# Patient Record
Sex: Female | Born: 1975
Health system: Southern US, Community
[De-identification: ages and names within clinical notes are randomized; demographics above are authoritative.]

## PROBLEM LIST (undated history)

## (undated) DIAGNOSIS — E119 Type 2 diabetes mellitus without complications: Secondary | ICD-10-CM

## (undated) DIAGNOSIS — E538 Deficiency of other specified B group vitamins: Secondary | ICD-10-CM

## (undated) DIAGNOSIS — I10 Essential (primary) hypertension: Secondary | ICD-10-CM

## (undated) DIAGNOSIS — K219 Gastro-esophageal reflux disease without esophagitis: Secondary | ICD-10-CM

## (undated) DIAGNOSIS — F32A Depression, unspecified: Secondary | ICD-10-CM

## (undated) DIAGNOSIS — M51369 Other intervertebral disc degeneration, lumbar region without mention of lumbar back pain or lower extremity pain: Secondary | ICD-10-CM

## (undated) DIAGNOSIS — F419 Anxiety disorder, unspecified: Secondary | ICD-10-CM

## (undated) DIAGNOSIS — K8020 Calculus of gallbladder without cholecystitis without obstruction: Secondary | ICD-10-CM

## (undated) DIAGNOSIS — E669 Obesity, unspecified: Secondary | ICD-10-CM

## (undated) DIAGNOSIS — G4730 Sleep apnea, unspecified: Secondary | ICD-10-CM

## (undated) DIAGNOSIS — K259 Gastric ulcer, unspecified as acute or chronic, without hemorrhage or perforation: Secondary | ICD-10-CM

## (undated) DIAGNOSIS — F329 Major depressive disorder, single episode, unspecified: Secondary | ICD-10-CM

## (undated) DIAGNOSIS — M1990 Unspecified osteoarthritis, unspecified site: Secondary | ICD-10-CM

## (undated) DIAGNOSIS — Z9884 Bariatric surgery status: Secondary | ICD-10-CM

## (undated) DIAGNOSIS — E039 Hypothyroidism, unspecified: Secondary | ICD-10-CM

## (undated) DIAGNOSIS — E079 Disorder of thyroid, unspecified: Secondary | ICD-10-CM

## (undated) HISTORY — DX: Gastric ulcer, unspecified as acute or chronic, without hemorrhage or perforation: K25.9

## (undated) HISTORY — DX: Unspecified osteoarthritis, unspecified site: M19.90

## (undated) HISTORY — DX: Disorder of thyroid, unspecified: E07.9

## (undated) HISTORY — DX: Gastro-esophageal reflux disease without esophagitis: K21.9

## (undated) HISTORY — PX: BARIATRIC SURGERY: SHX1103

---

## 1993-09-03 HISTORY — PX: BREAST BIOPSY: SHX20

## 1994-09-03 HISTORY — PX: TUBAL LIGATION: SHX77

## 2004-07-15 ENCOUNTER — Emergency Department: Payer: Self-pay | Admitting: Emergency Medicine

## 2004-08-07 ENCOUNTER — Emergency Department: Payer: Self-pay | Admitting: Emergency Medicine

## 2005-11-20 ENCOUNTER — Emergency Department: Payer: Self-pay | Admitting: Emergency Medicine

## 2006-01-01 ENCOUNTER — Emergency Department: Payer: Self-pay | Admitting: Emergency Medicine

## 2006-01-01 IMAGING — CR DG HAND COMPLETE 3+V*L*
1 series · 3 of 3 positions shown · non-contrast
Comparison: none

REASON FOR EXAM: INJURY
COMMENTS:

PROCEDURE:     DXR - DXR HAND LT COMPLETE  W/OBLIQUES  - [DATE]  [DATE]
RESULT:     Three views of the LEFT hand show no fracture, dislocation or
other acute bony abnormality.

[Series 1: view not recorded · 0.17mm/px · 3 of 3 slices shown]
[im 1/3]
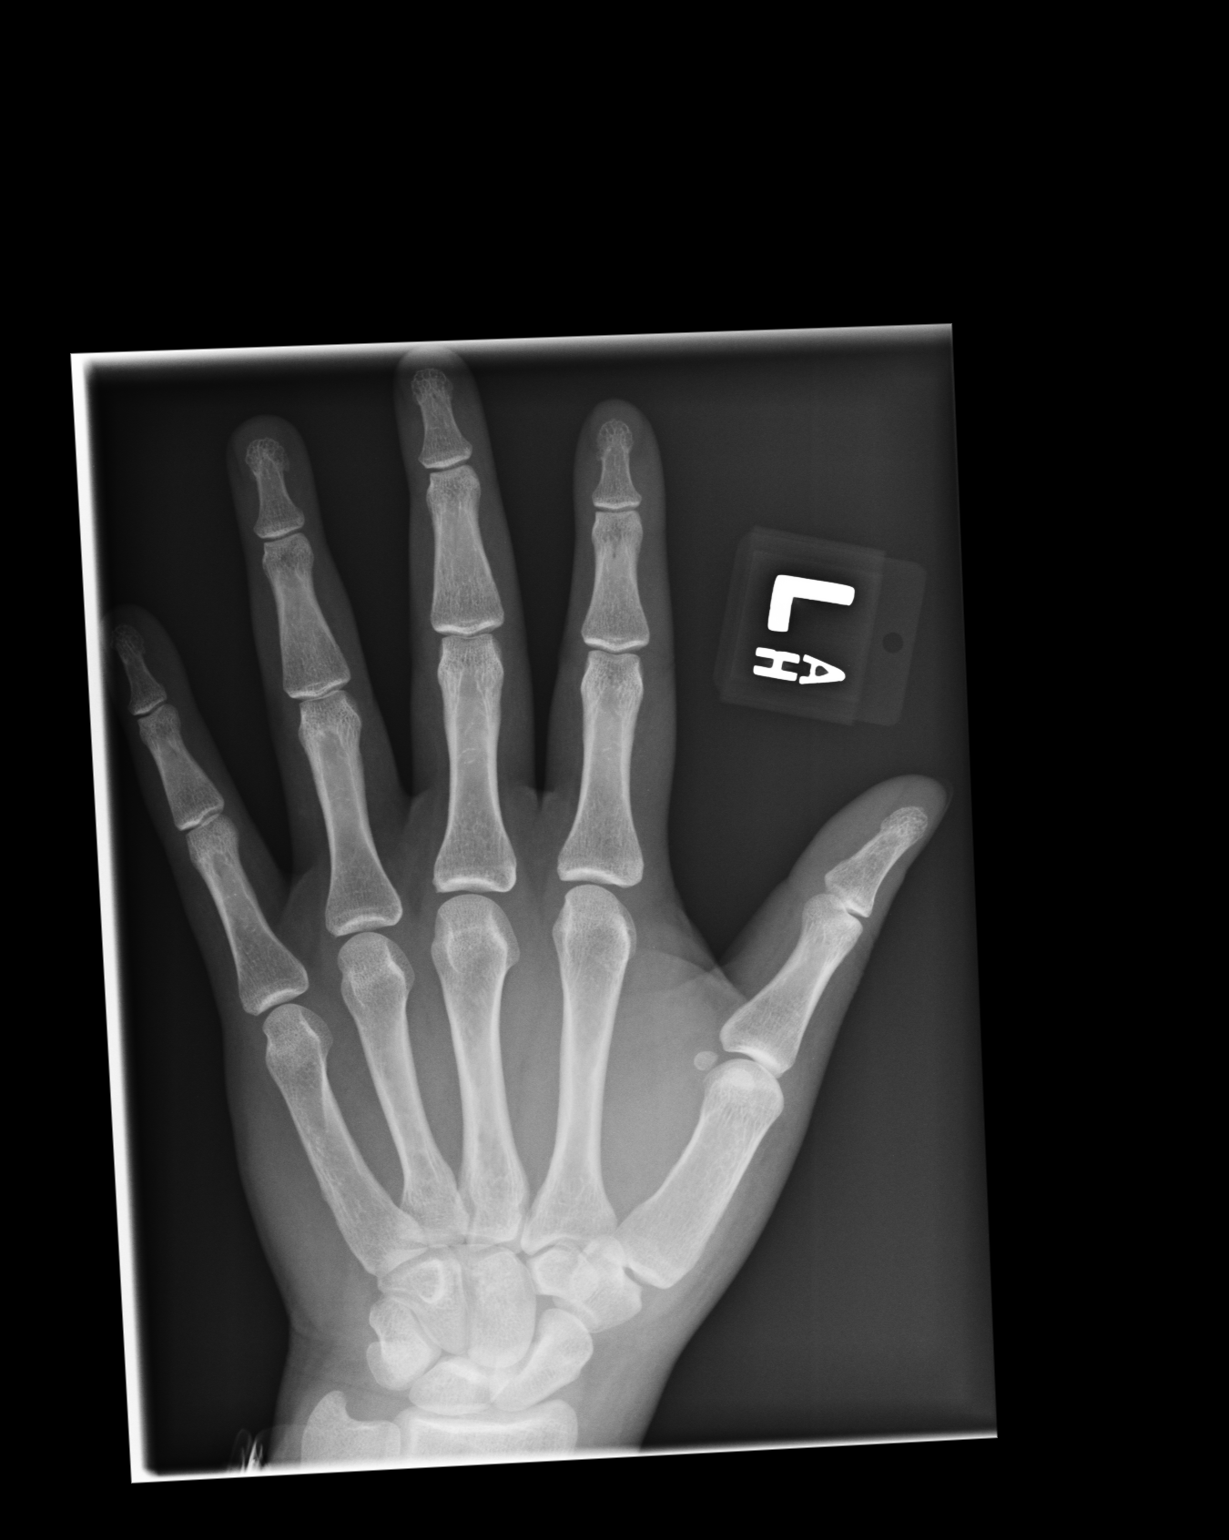
[im 2/3]
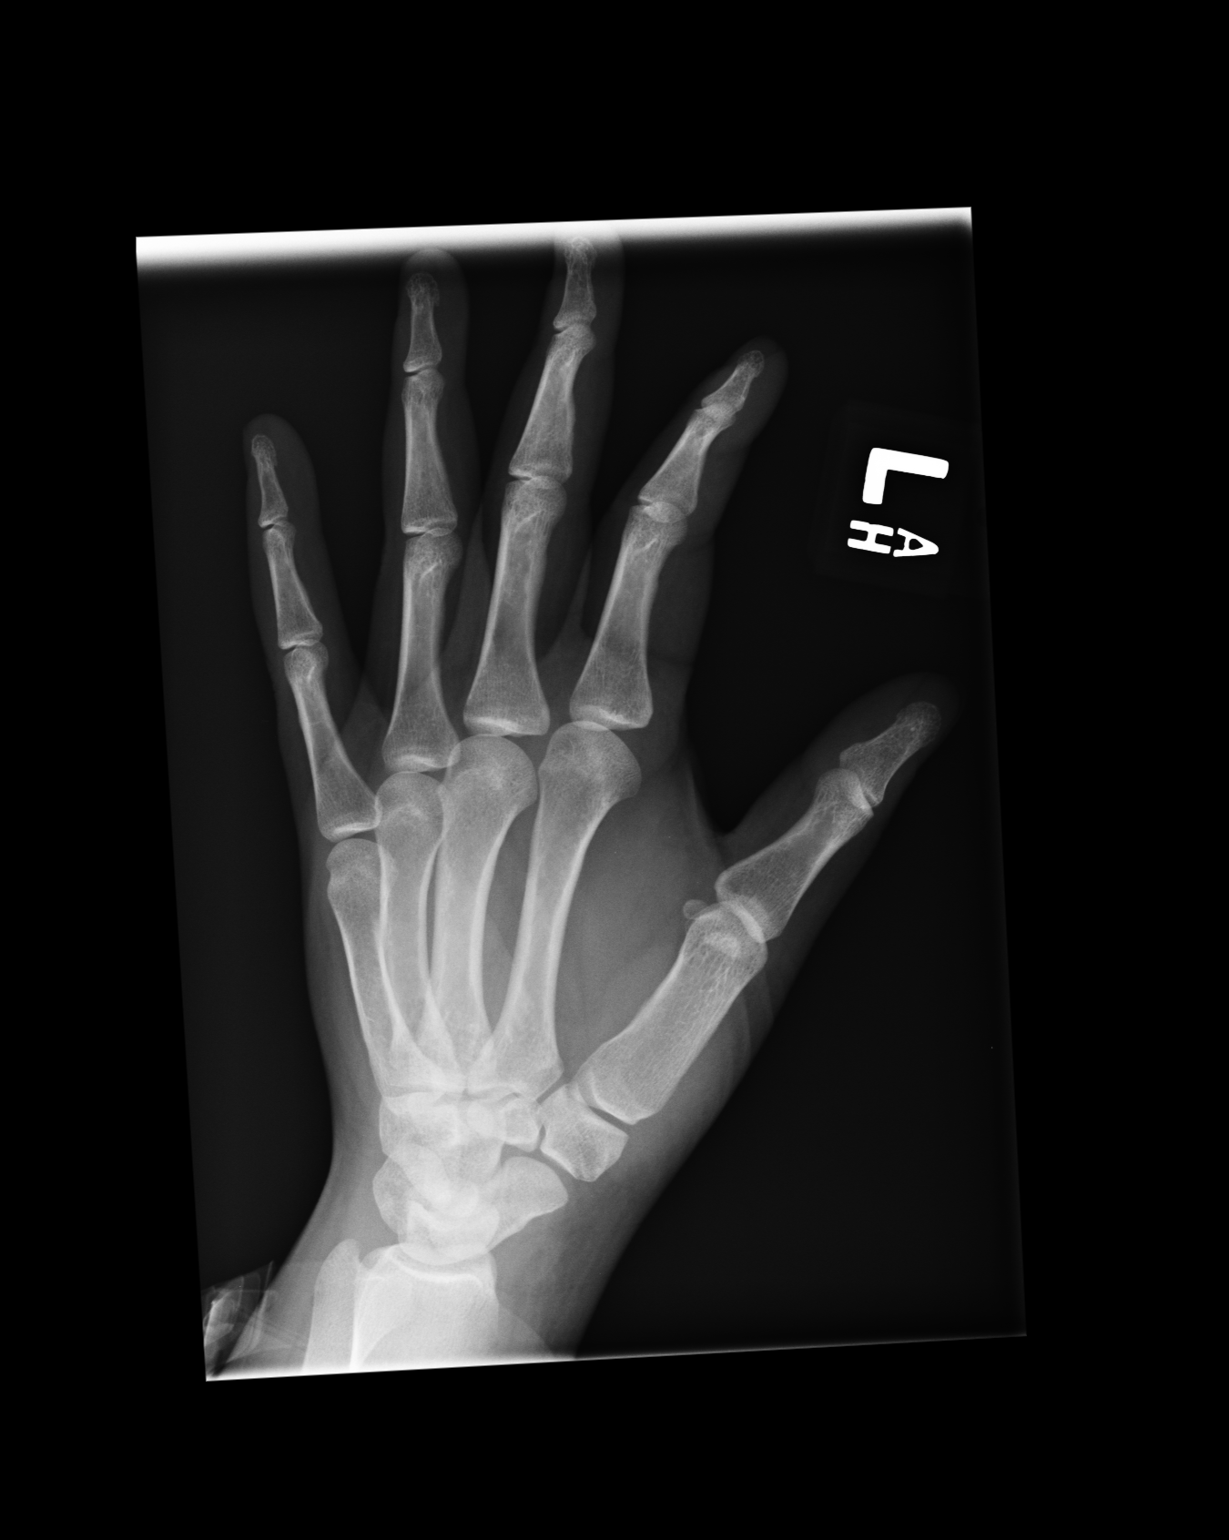
[im 3/3]
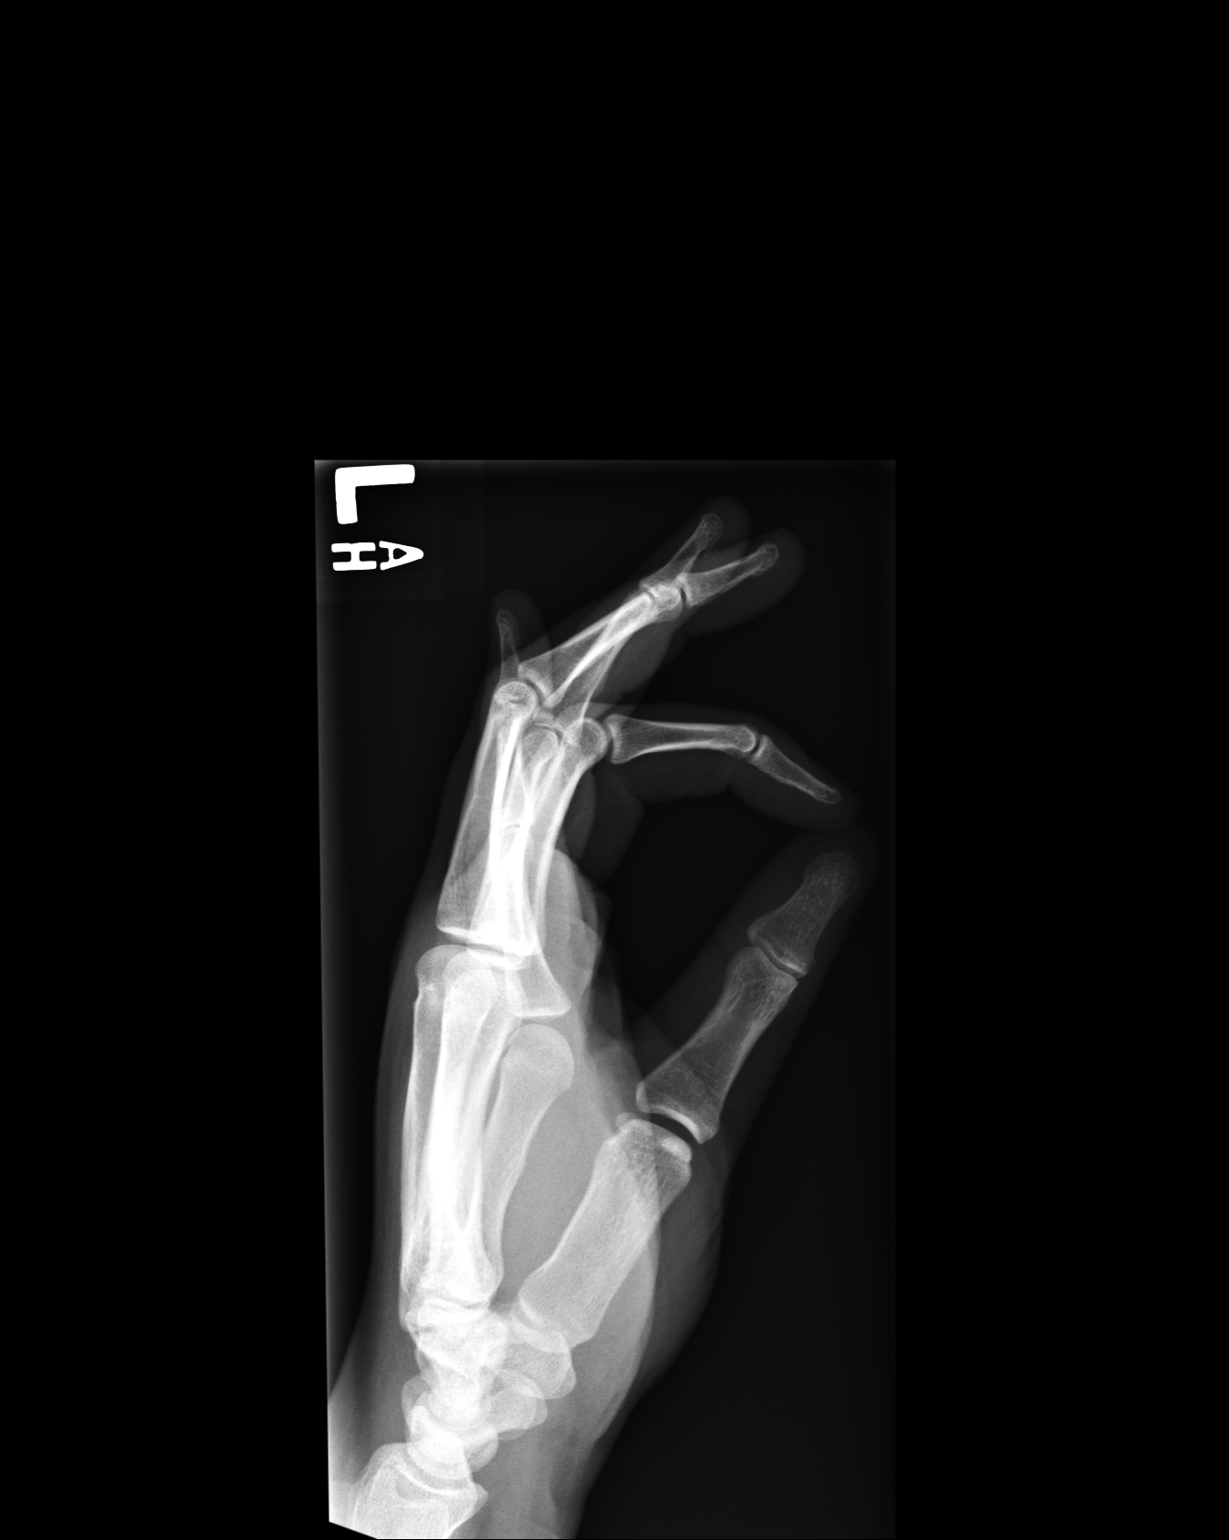

[3 of 3 positions shown; findings below may reference images not displayed]

IMPRESSION: No significant abnormalities are noted.

## 2006-01-27 ENCOUNTER — Emergency Department: Payer: Self-pay | Admitting: Emergency Medicine

## 2006-04-22 ENCOUNTER — Emergency Department: Payer: Self-pay | Admitting: Emergency Medicine

## 2006-09-25 ENCOUNTER — Emergency Department: Payer: Self-pay

## 2007-10-27 ENCOUNTER — Emergency Department: Payer: Self-pay | Admitting: Emergency Medicine

## 2007-10-27 ENCOUNTER — Other Ambulatory Visit: Payer: Self-pay

## 2008-11-24 ENCOUNTER — Ambulatory Visit: Payer: Self-pay | Admitting: Internal Medicine

## 2009-04-12 ENCOUNTER — Emergency Department: Payer: Self-pay | Admitting: Emergency Medicine

## 2009-09-04 ENCOUNTER — Ambulatory Visit: Payer: Self-pay | Admitting: Family Medicine

## 2009-10-24 ENCOUNTER — Ambulatory Visit: Payer: Self-pay | Admitting: Family Medicine

## 2009-10-27 ENCOUNTER — Ambulatory Visit: Payer: Self-pay | Admitting: Family Medicine

## 2010-08-20 ENCOUNTER — Ambulatory Visit: Payer: Self-pay | Admitting: Internal Medicine

## 2010-09-03 HISTORY — PX: GASTRIC BYPASS: SHX52

## 2010-11-17 ENCOUNTER — Ambulatory Visit: Payer: Self-pay | Admitting: Internal Medicine

## 2011-05-18 ENCOUNTER — Ambulatory Visit: Payer: Self-pay | Admitting: Bariatrics

## 2011-05-18 IMAGING — RF DG UGI W/O KUB
1 series · 15 of 24 positions shown · non-contrast
Comparison: None

REASON FOR EXAM: morbid obesity hypertension
COMMENTS:

PROCEDURE:     FL  - FL UPPER GI  - [DATE]  [DATE]
RESULT:     Indication: Obesity

[Series 1: run · 25 acquisitions, 15 frames shown]
[im 1/25]
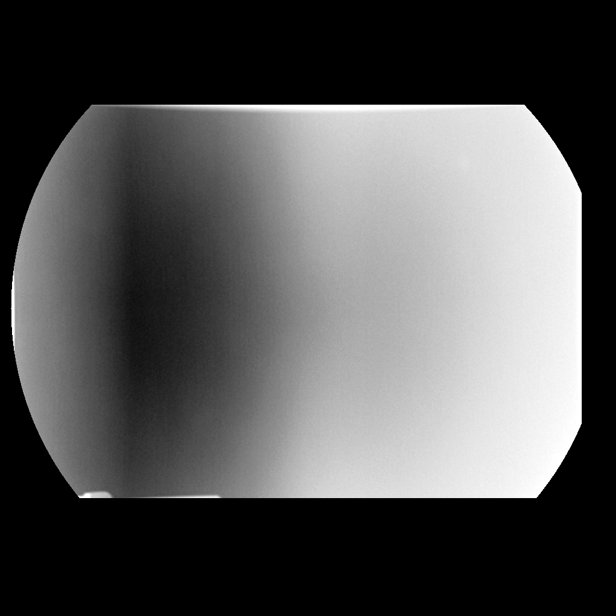
[im 2/25]
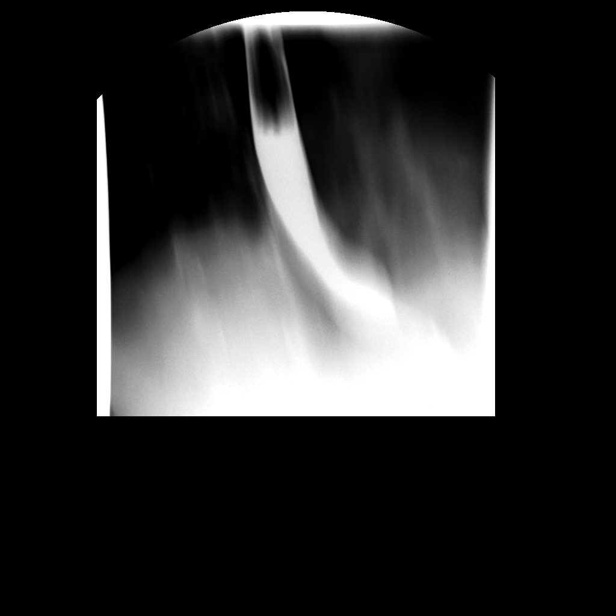
[im 3/25]
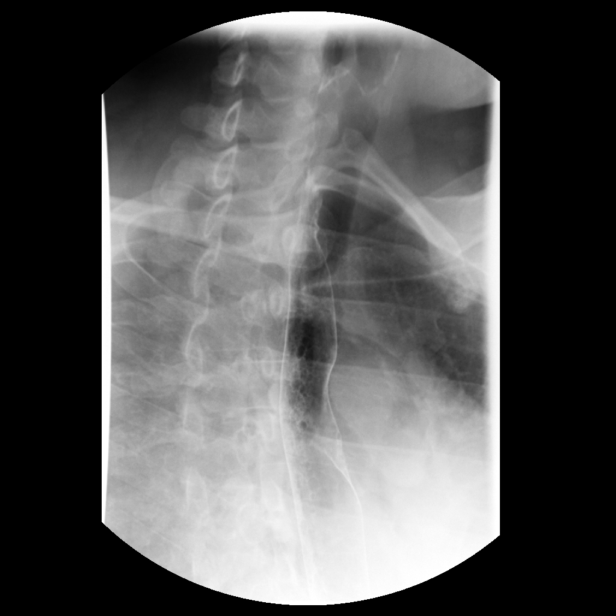
[im 5/25]
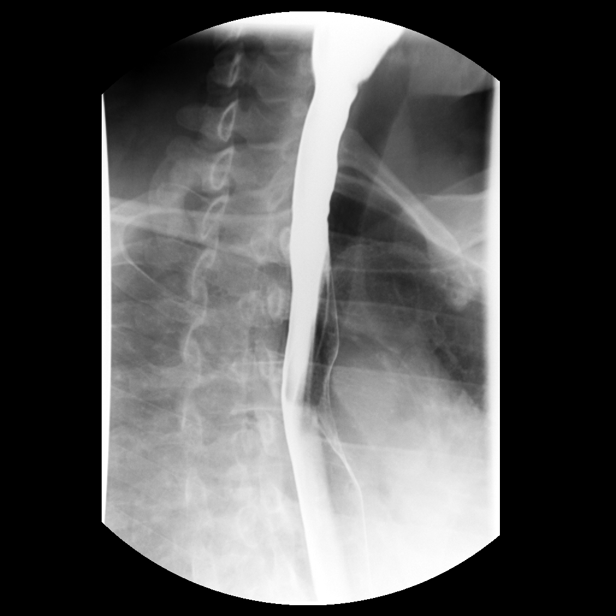
[im 7/25]
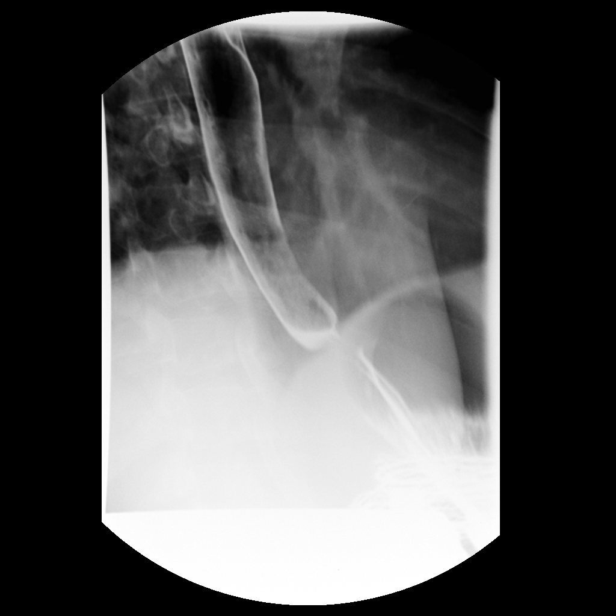
[im 8/25]
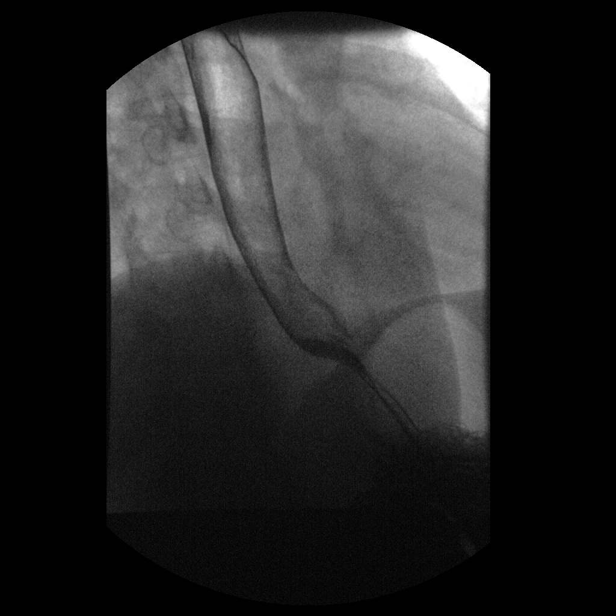
[im 11/25]
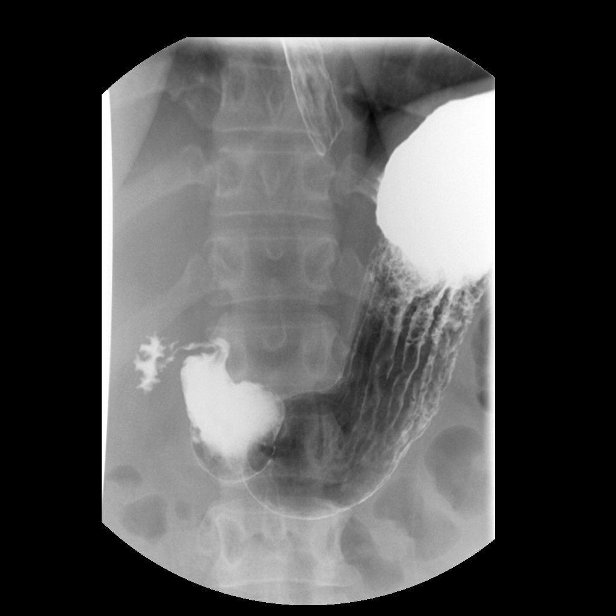
[im 14/25]
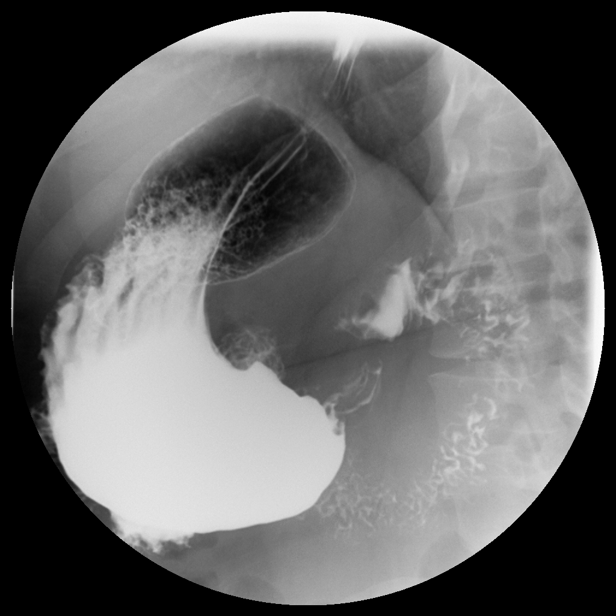
[im 14/25]
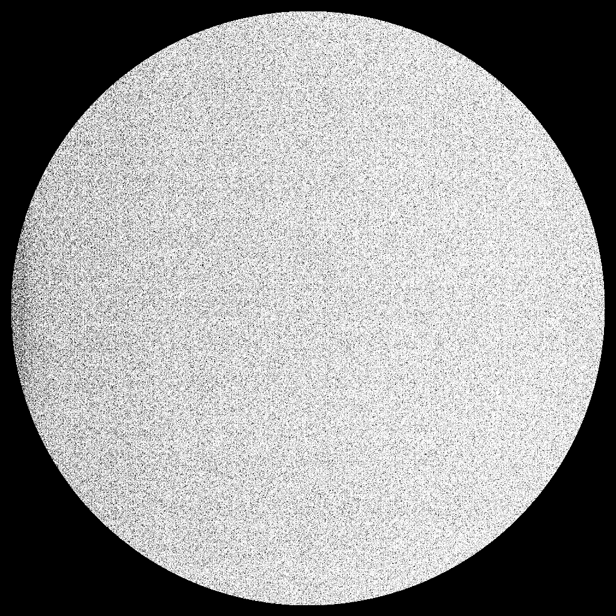
[im 17/25]
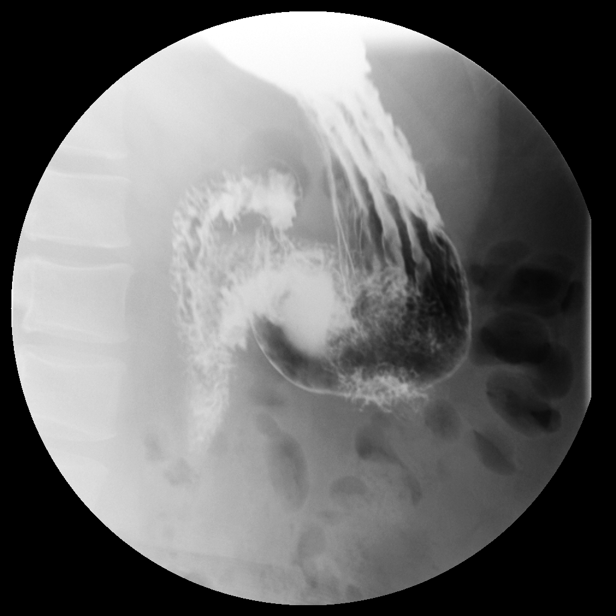
[im 18/25]
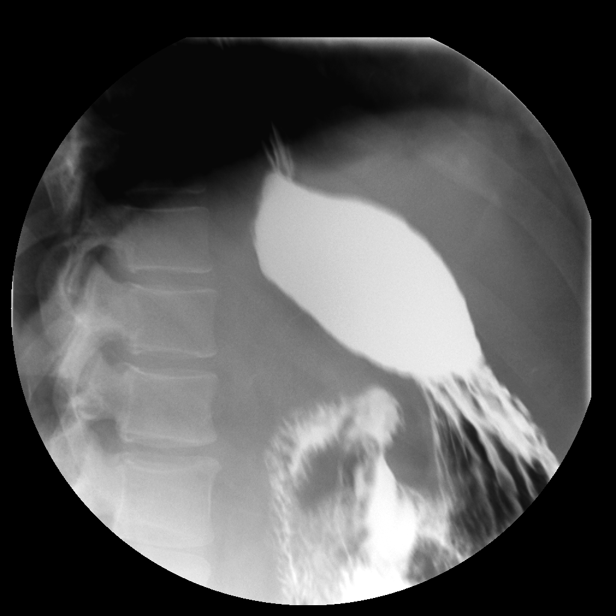
[im 20/25]
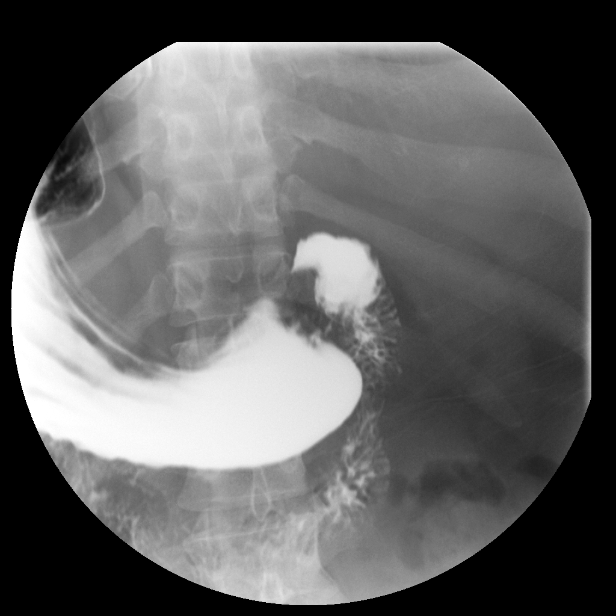
[im 22/25]
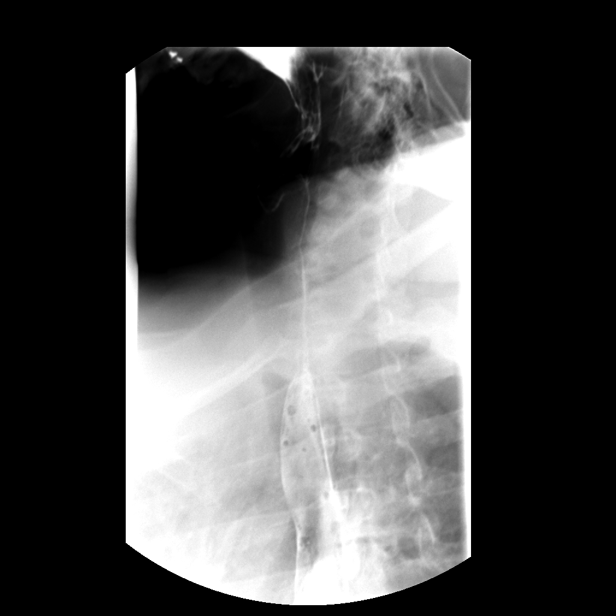
[im 22/25]
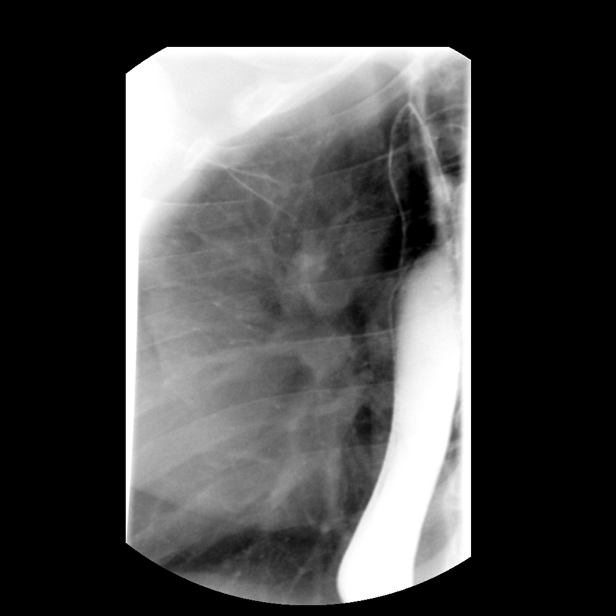
[im 25/25]
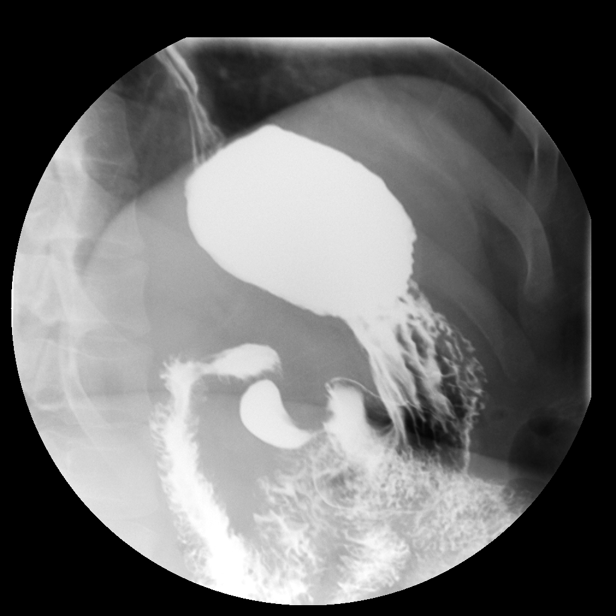

[15 of 24 positions shown; findings below may reference images not displayed]

FINDINGS: Biphasic examination of the esophagus to the distal duodenum was performed
without complication. Total fluoroscopy time was 1.5 minutes.

Examination of the esophagus demonstrated normal esophageal motility. Normal
esophageal morphology without evidence of esophagitis or ulceration. No
esophageal stricture, diverticula, or mass lesion. No evidence of hiatal
hernia. There is mild gastroesophageal reflux.

Examination of the stomach demonstrated normal rugal folds and areae
gastricae. The gastric mucosa appeared unremarkable without evidence of
ulceration, scarring, or mass lesion. Gastric motility and emptying was
normal. Fluoroscopic examination of the duodenum demonstrates normal
motility and morphology without evidence of ulceration or mass lesion.
IMPRESSION: Mild gastroesophageal reflux. Otherwise normal Upper GI.

## 2011-05-18 IMAGING — CR DG CHEST 2V
1 series · 2 of 2 positions shown · non-contrast
Comparison: none

REASON FOR EXAM: morbjd obesity, hypertension
COMMENTS:

PROCEDURE:     DXR - DXR CHEST PA (OR AP) AND LATERAL  - [DATE]  [DATE]
RESULT:     The lung fields are clear. No pneumonia, pneumothorax or pleural
effusion is seen. Heart size is normal. The mediastinal and osseous
structures show no significant abnormalities.

[Series 1: view not recorded · 0.17mm/px · 2 of 2 slices shown]
[im 1/2]
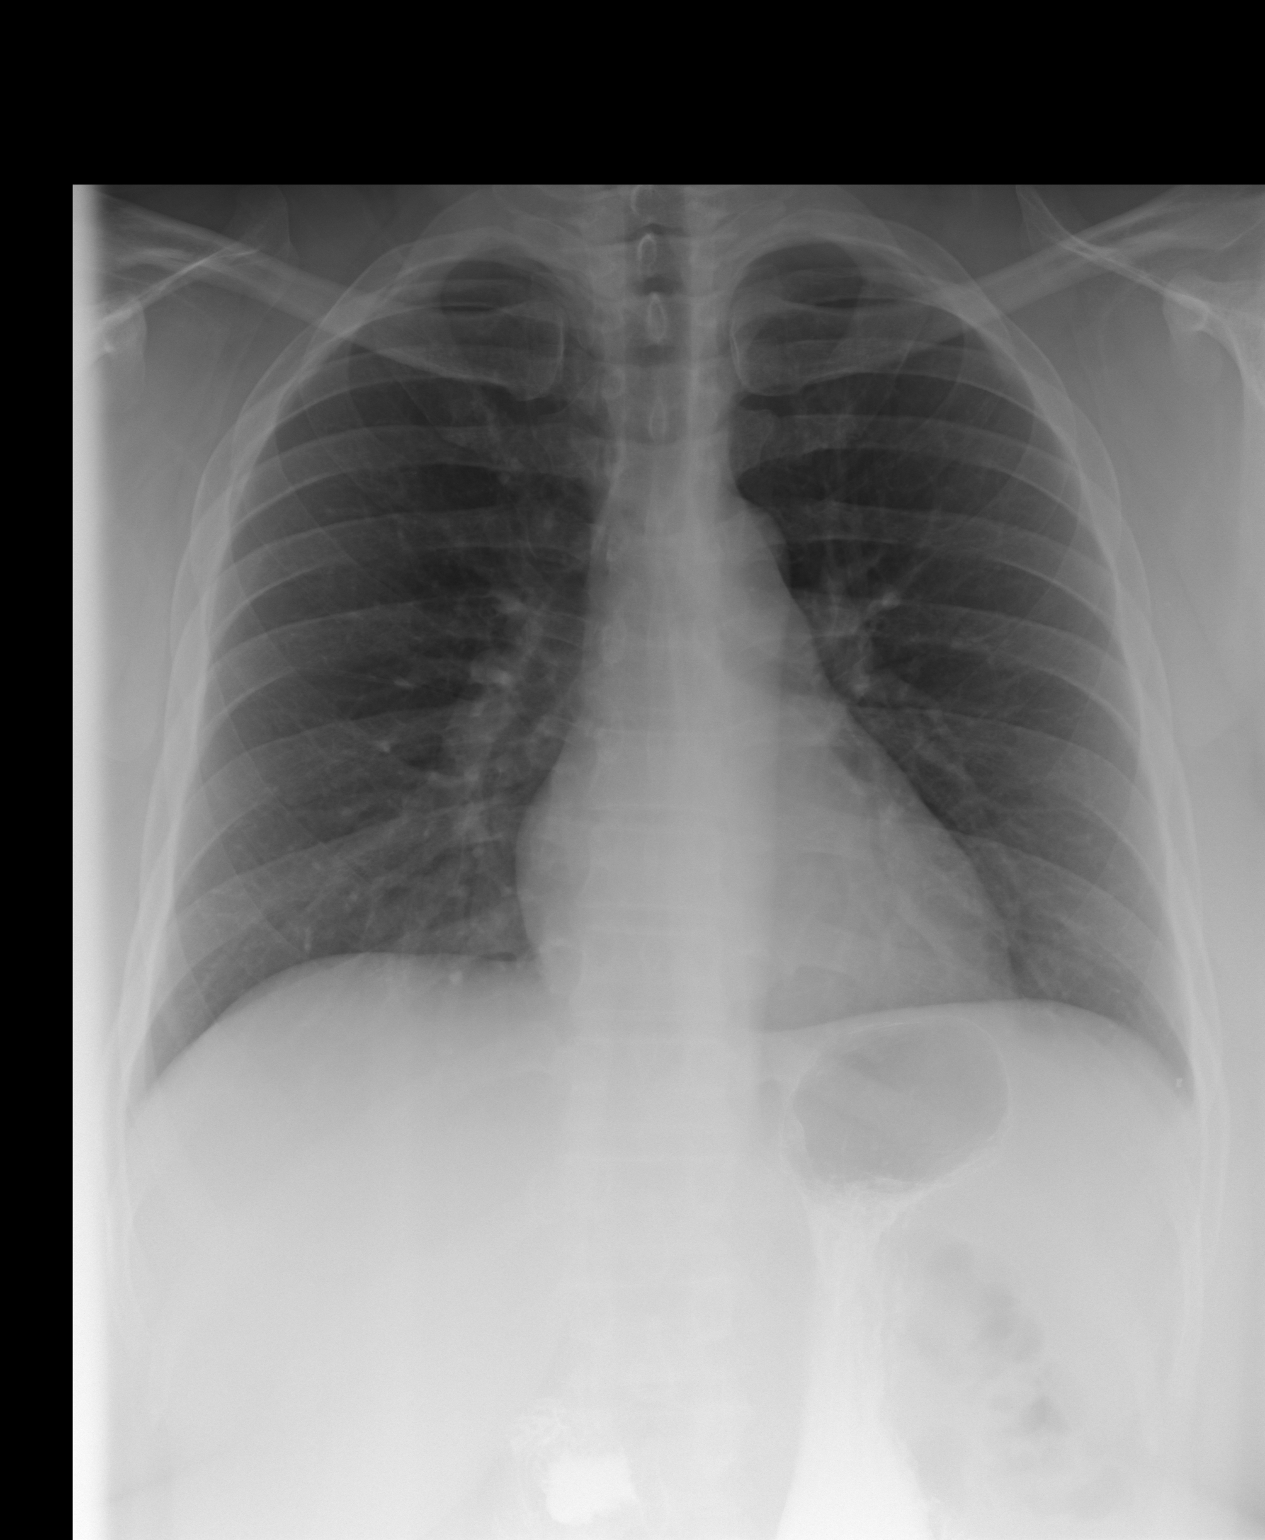
[im 2/2]
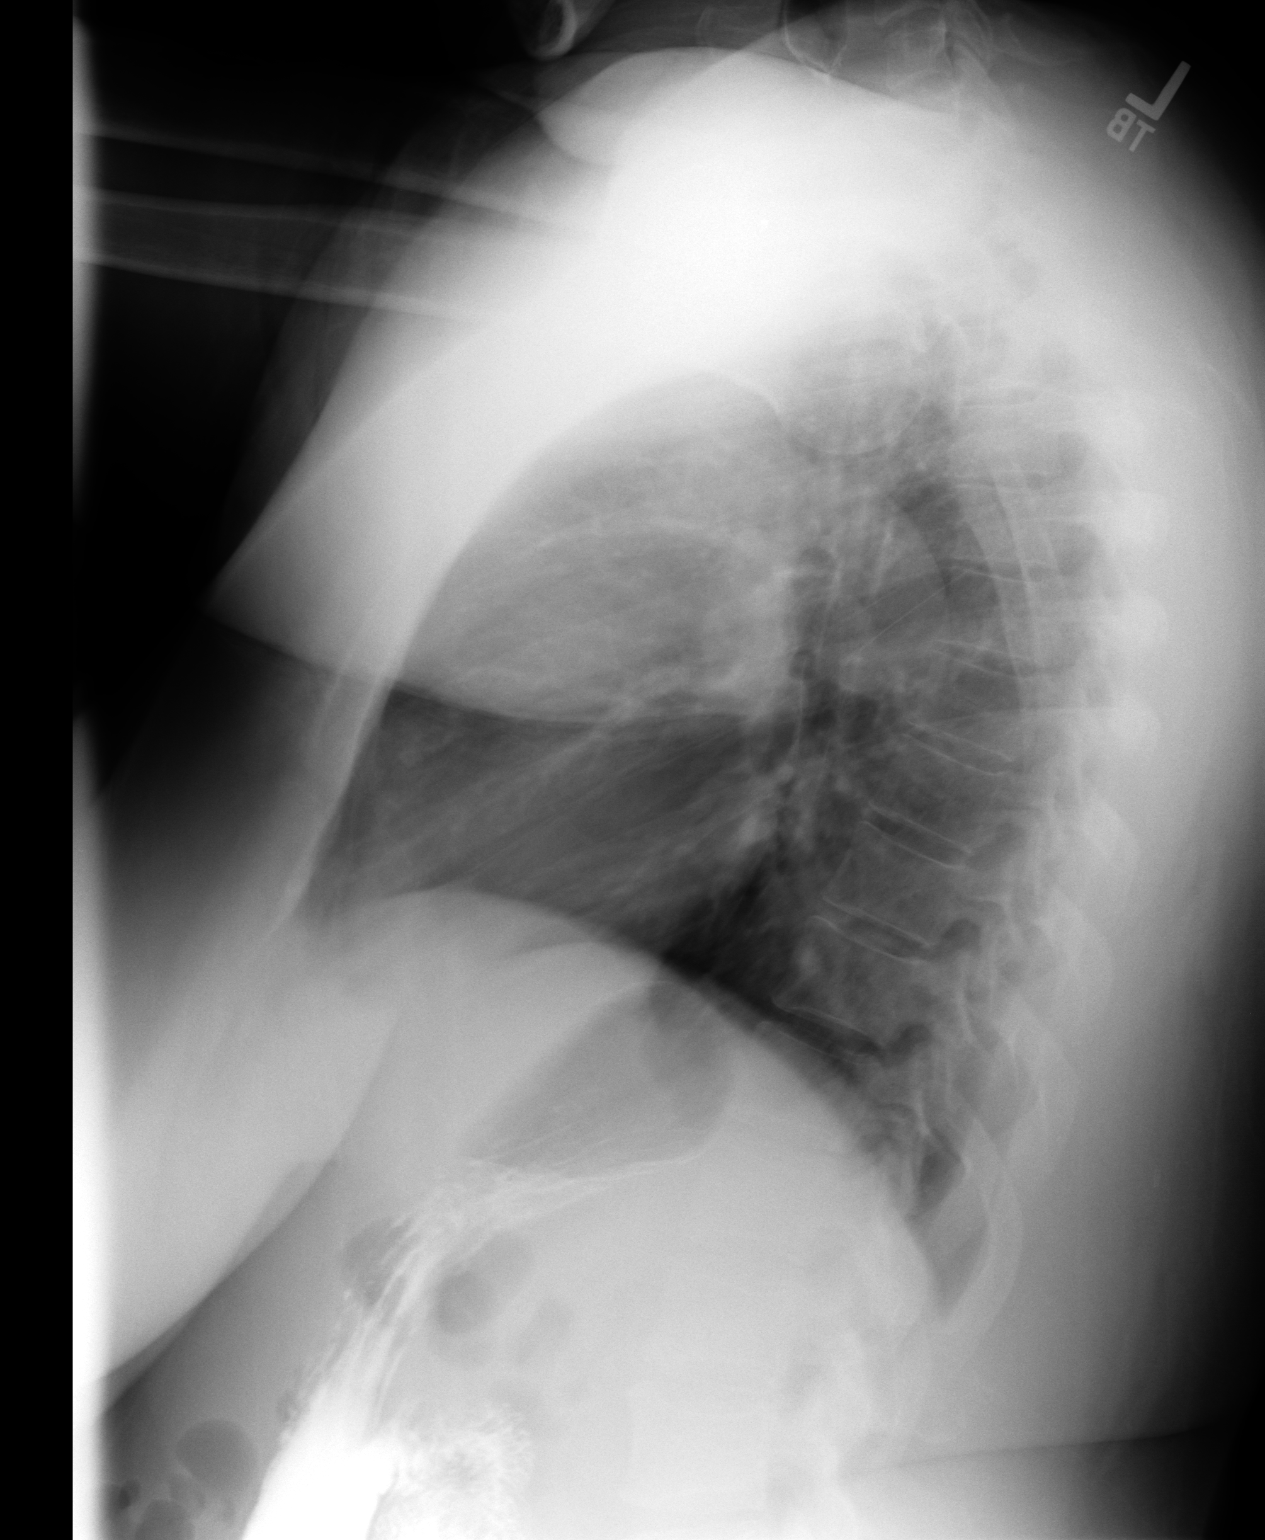

[2 of 2 positions shown; findings below may reference images not displayed]

IMPRESSION: No acute changes are identified.

## 2011-05-18 IMAGING — US ABDOMEN ULTRASOUND LIMITED
1 series · 17 of 25 positions shown · non-contrast
Comparison: none

REASON FOR EXAM: morbid obesity hypertension
COMMENTS:

[Series 1: abdomen ultrasound limited · 17 of 45 slices shown]
[im 1/45]
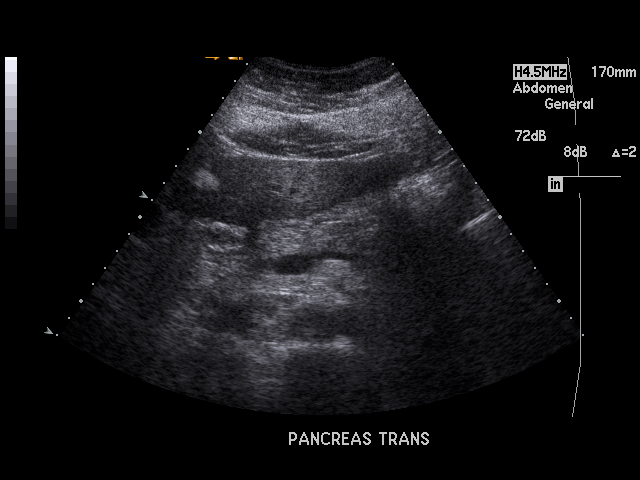
[im 4/45]
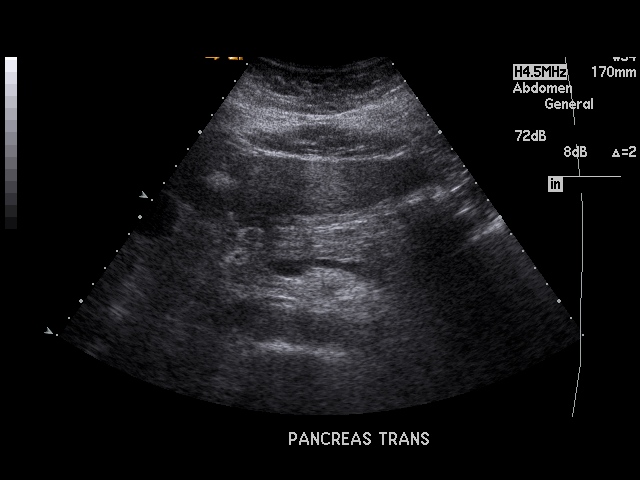
[im 6/45]
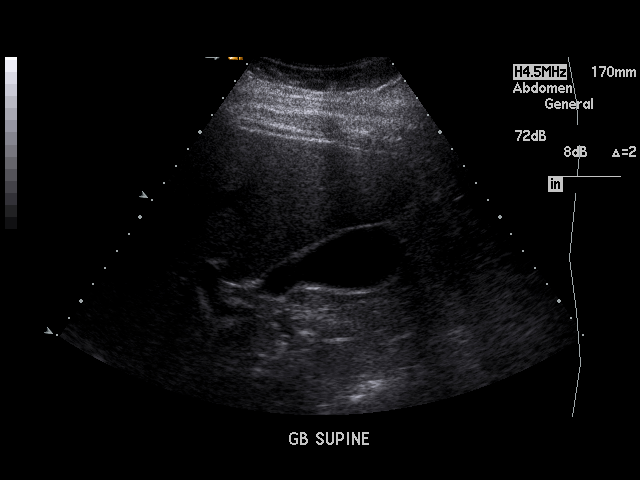
[im 10/45]
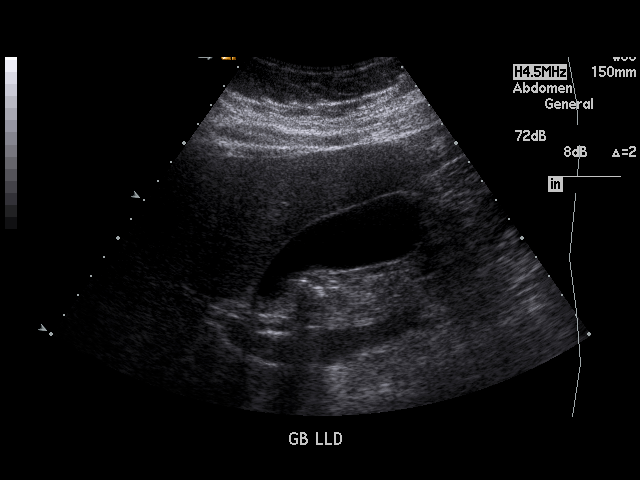
[im 12/45]
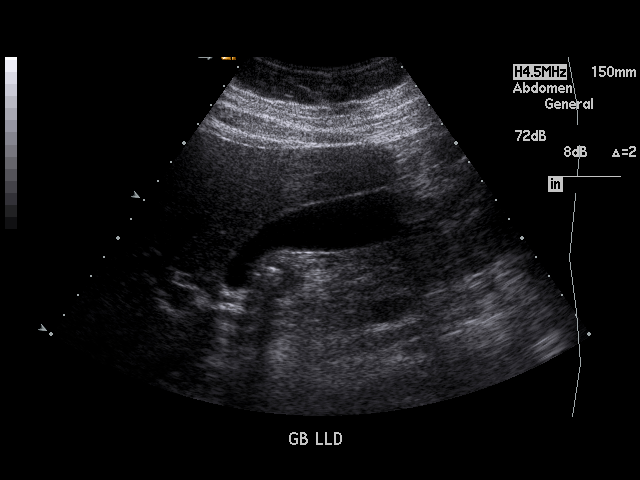
[im 15/45]
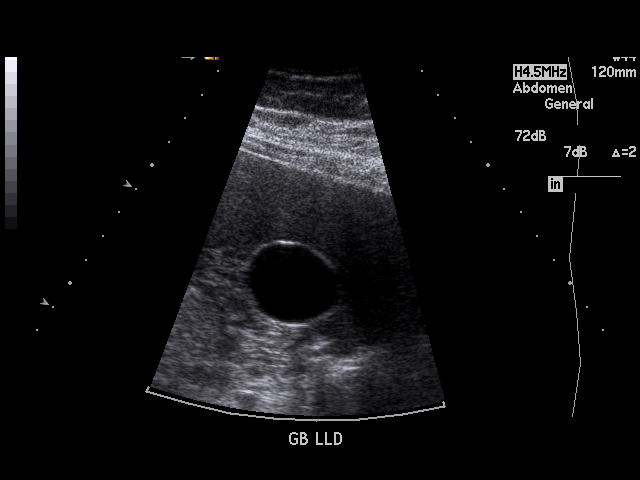
[im 17/45]
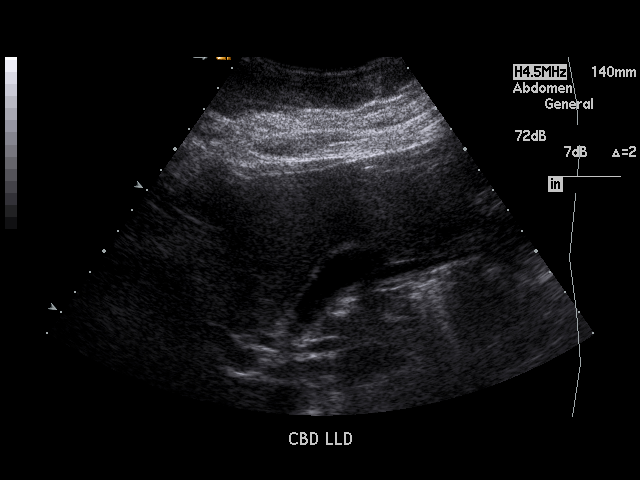
[im 21/45]
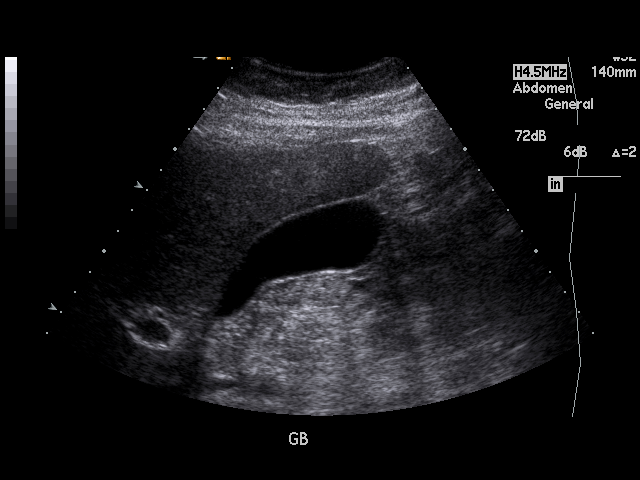
[im 23/45]
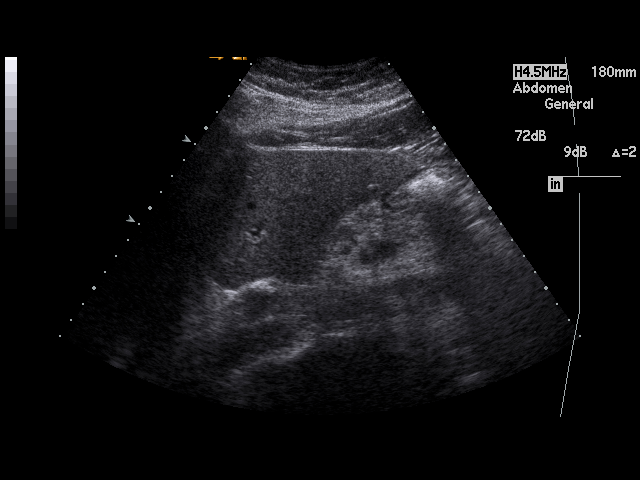
[im 24/45]
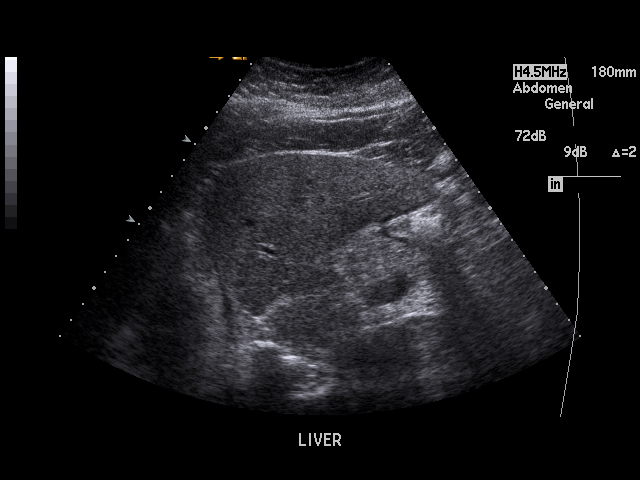
[im 28/45]
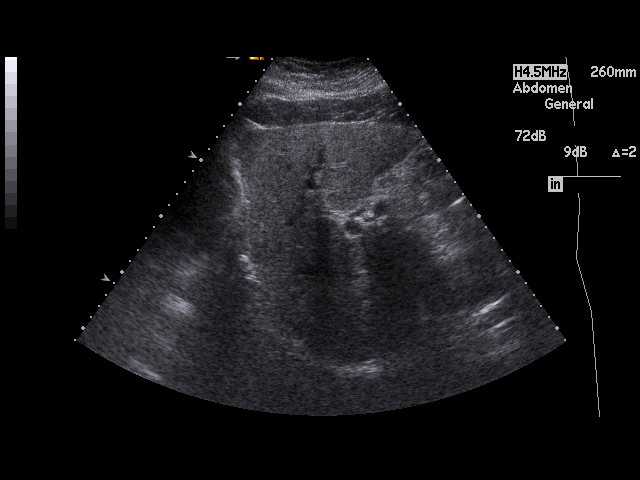
[im 30/45]
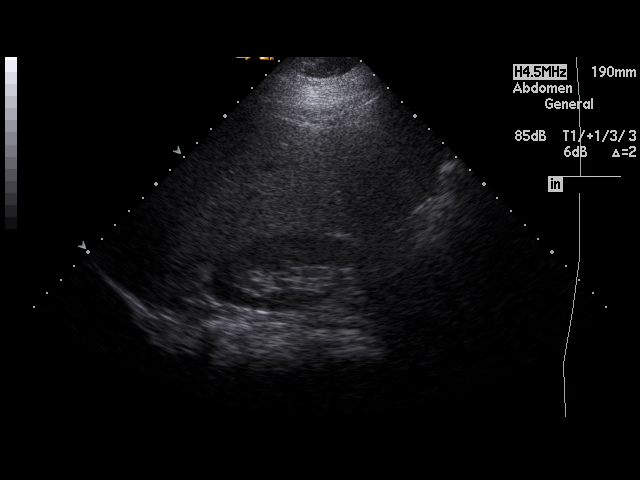
[im 34/45]
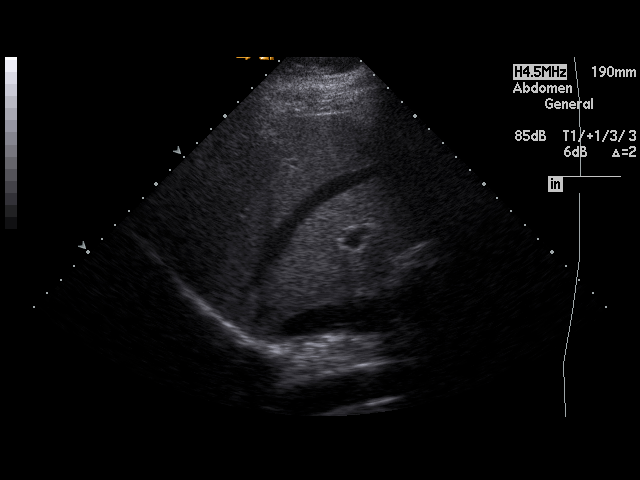
[im 35/45]
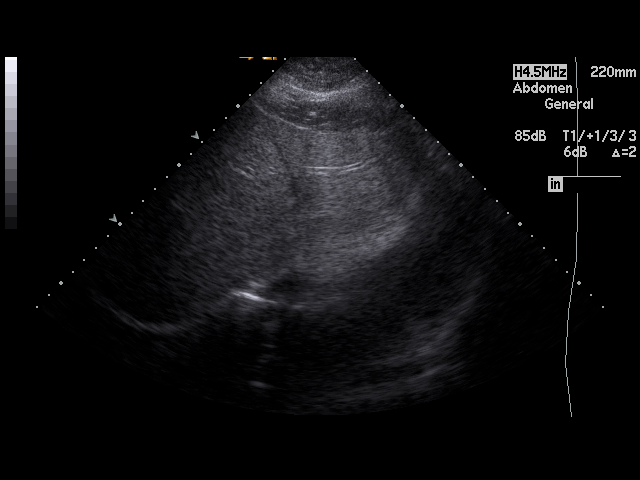
[im 39/45]
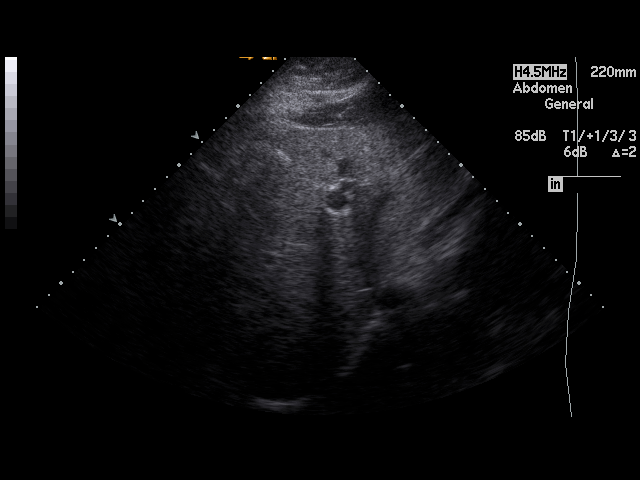
[im 41/45]
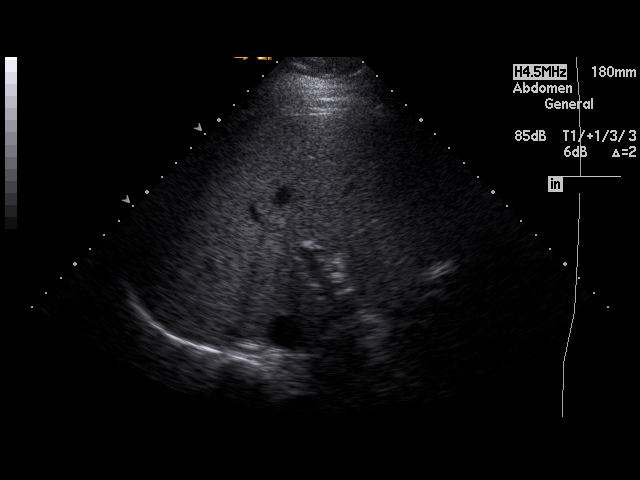
[im 45/45]
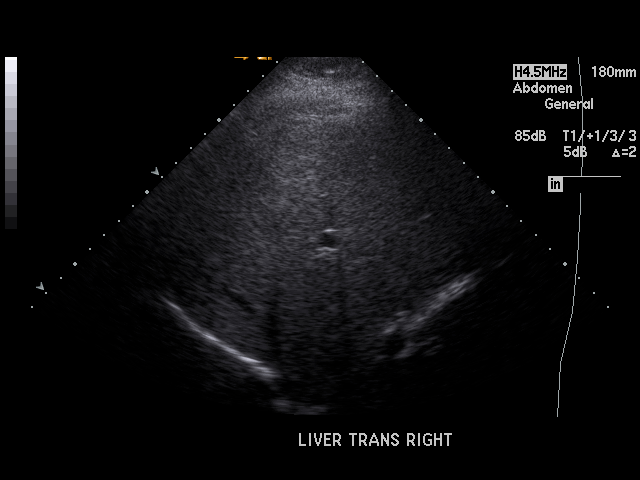

[17 of 25 positions shown; findings below may reference images not displayed]

PROCEDURE:     US  - US ABDOMEN LIMITED SURVEY  - [DATE] [DATE]

RESULT:     Limited examination performed by the gastric bypass protocol was
performed. No gallstones are seen. There is no thickening of gallbladder
wall. The common bile duct measures 5.2 mm in diameter which is within
normal limits. The head and body of the pancreas show no significant
abnormalities. The pancreatic tail is obscured. No hepatic abnormalities are
identified.
IMPRESSION: No significant abnormalities are noted.

## 2011-06-01 ENCOUNTER — Ambulatory Visit: Payer: Self-pay | Admitting: Oncology

## 2011-06-04 ENCOUNTER — Ambulatory Visit: Payer: Self-pay | Admitting: Oncology

## 2011-06-07 ENCOUNTER — Ambulatory Visit: Payer: Self-pay | Admitting: Bariatrics

## 2011-06-20 ENCOUNTER — Ambulatory Visit: Payer: Self-pay | Admitting: Family Medicine

## 2011-06-21 ENCOUNTER — Other Ambulatory Visit: Payer: Self-pay | Admitting: Bariatrics

## 2011-06-22 ENCOUNTER — Ambulatory Visit: Payer: Self-pay | Admitting: Internal Medicine

## 2011-06-25 ENCOUNTER — Ambulatory Visit: Payer: Self-pay

## 2011-07-05 ENCOUNTER — Ambulatory Visit: Payer: Self-pay | Admitting: Oncology

## 2011-07-05 ENCOUNTER — Ambulatory Visit: Payer: Self-pay | Admitting: Bariatrics

## 2011-07-10 ENCOUNTER — Ambulatory Visit: Payer: Self-pay | Admitting: Bariatrics

## 2011-07-17 ENCOUNTER — Inpatient Hospital Stay: Payer: Self-pay | Admitting: Bariatrics

## 2011-07-18 IMAGING — CR DG CHEST 2V
1 series · 2 of 2 positions shown · non-contrast
Comparison: none

REASON FOR EXAM: Hypoxia
COMMENTS:

[Series 1: x chest ap · 0.14mm/px · 2 of 2 slices shown]
[im 1/2]
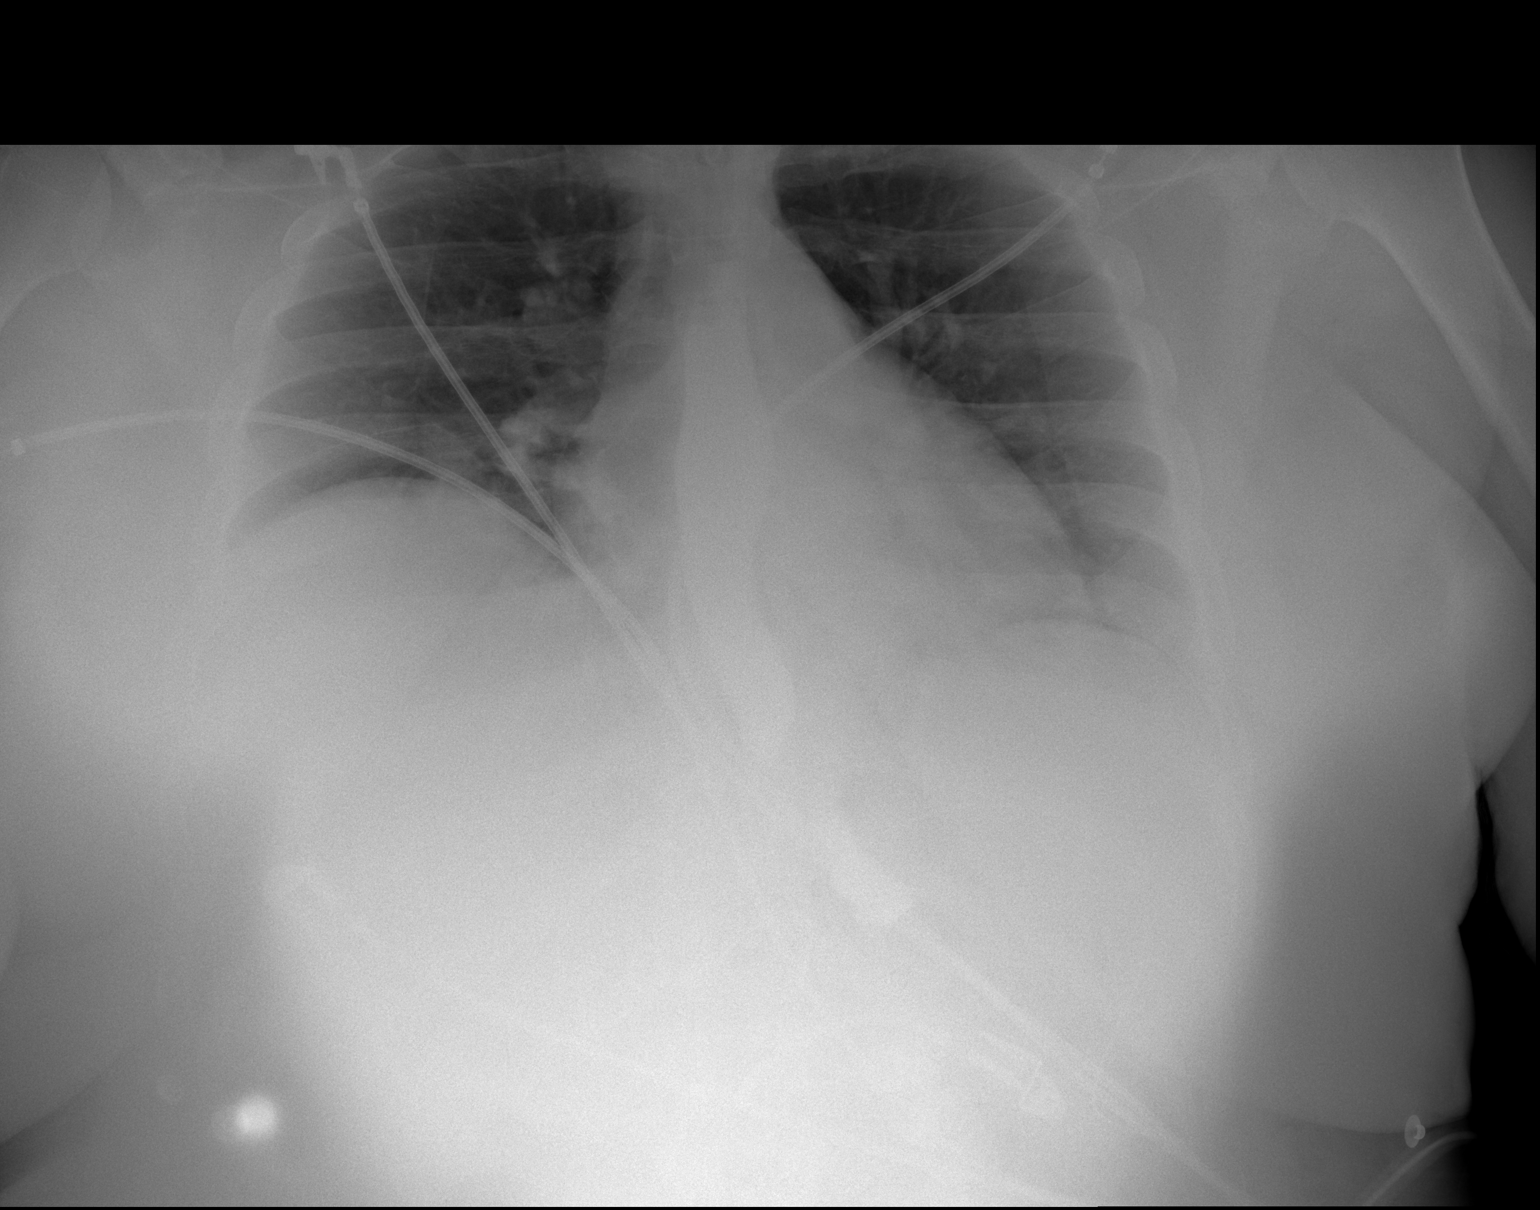
[im 2/2]
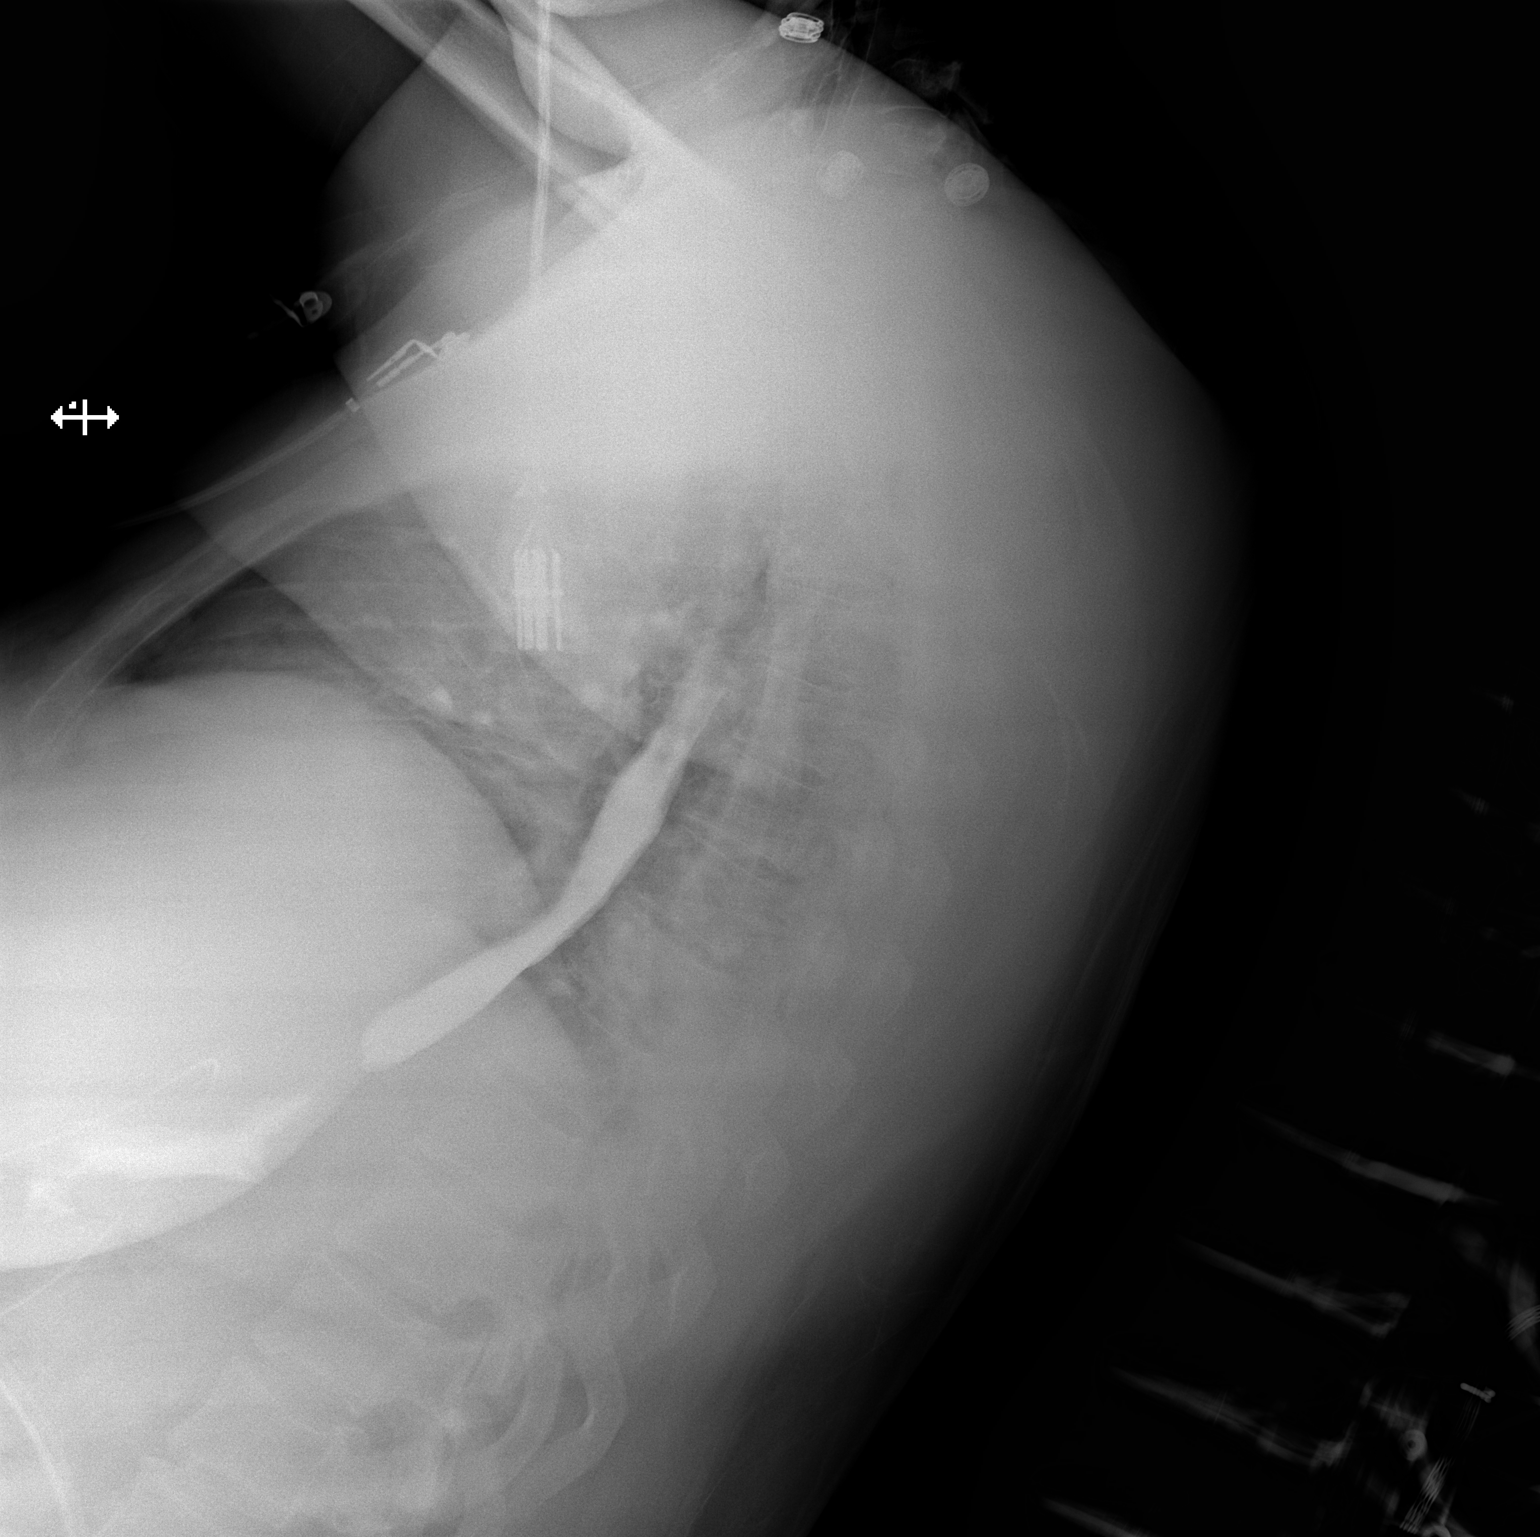

[2 of 2 positions shown; findings below may reference images not displayed]

PROCEDURE:     DXR - DXR CHEST PA (OR AP) AND LATERAL  - [DATE]  [DATE]

RESULT:     Comparison is made to the prior exam of [DATE]. The
inspiratory level is suboptimal. The lung fields are clear. The heart,
mediastinal and osseous structures show no significant abnormalities.
Monitoring electrodes are present. Incidental note is made of contrast
material that is visualized in a nondilated esophagus.
IMPRESSION: 1. The lung fields are clear.
2. Contrast material is visualized within the esophagus.

## 2011-07-18 IMAGING — RF DG UGI W/O KUB
9 series · 15 of 24 positions shown · non-contrast
Comparison: none

REASON FOR EXAM: post procedure gastric bypass evaluate for leak
COMMENTS:

PROCEDURE:     FL  - FL UPPER GI  - [DATE]  [DATE]
RESULT:     Comparison: None.
TECHNIQUE: Single contrast barium swallow was performed with water-soluble
Gastrografin, with monitoring by overhead images and intermittent
fluoroscopy.

[Series 1: view not recorded · 0.17mm/px · 2 of 4 slices shown]
[im 1/4]
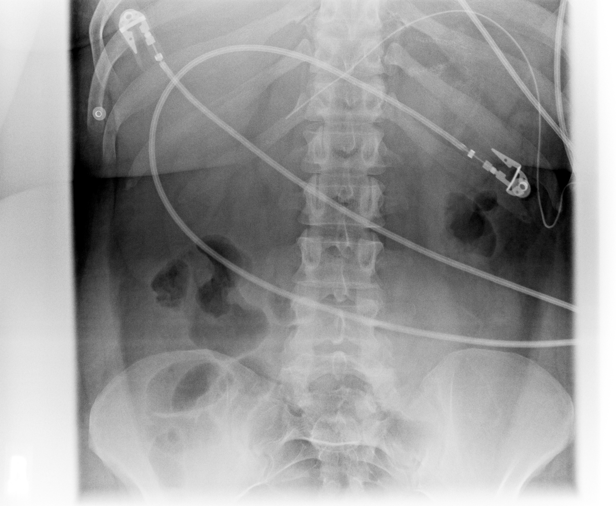
[im 3/4]
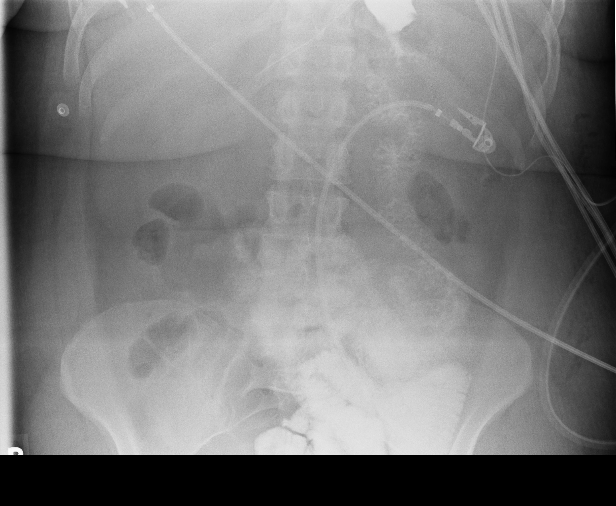

[Series 3: fluoro_barium 2fps_bw · 0.20mm/px · 1 of 1 slices shown (1 of 7)]
[im 1/1]
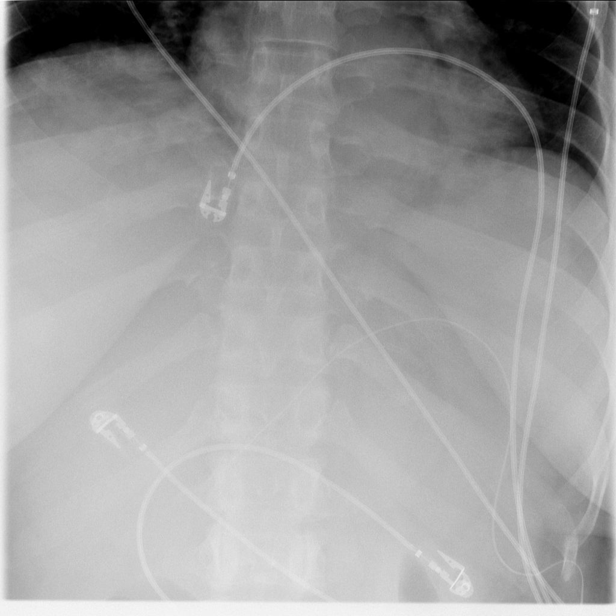

[Series 4: fluoro_barium 2fps_bw · 0.21mm/px · 2 of 5 frames shown (2 of 7)]
[frame 1/5]
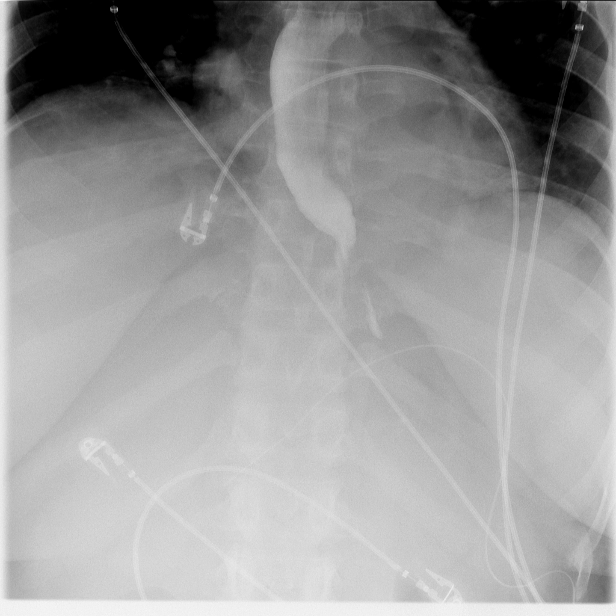
[frame 5/5]
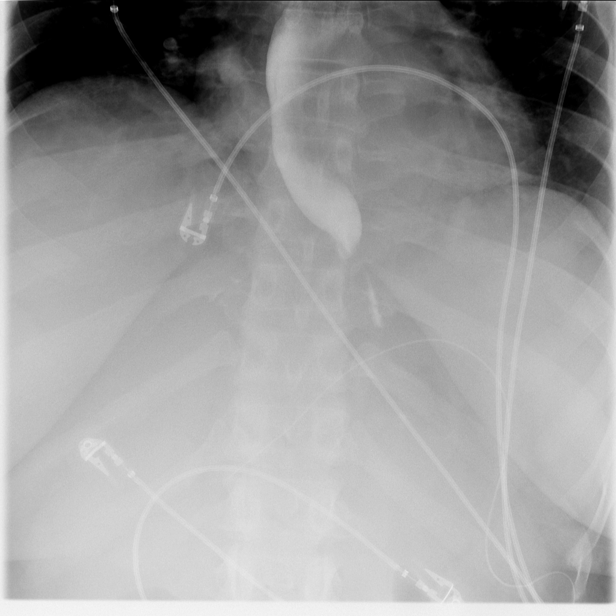

[Series 5: fluoro_barium 2fps_bw · 0.21mm/px · 2 of 4 frames shown (3 of 7)]
[frame 1/4]
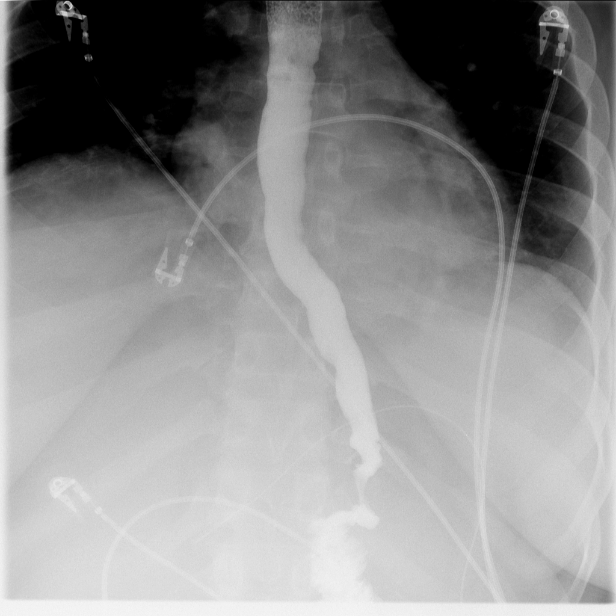
[frame 4/4]
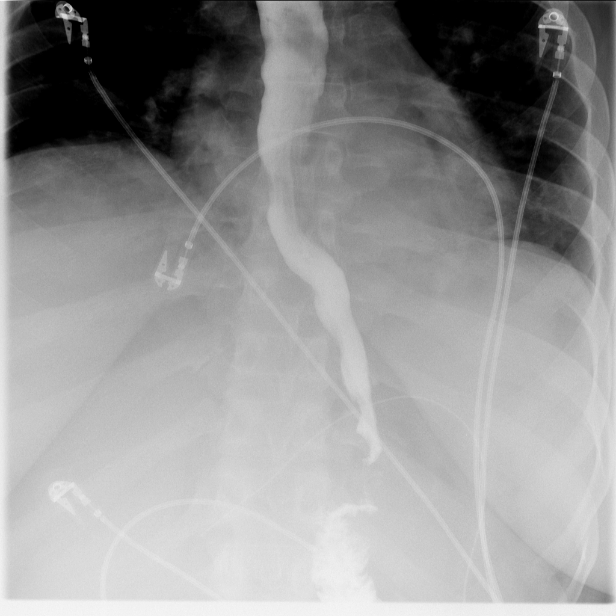

[Series 6: fluoro_barium 2fps_bw · 0.19mm/px · 2 of 9 frames shown (4 of 7)]
[frame 5/9]
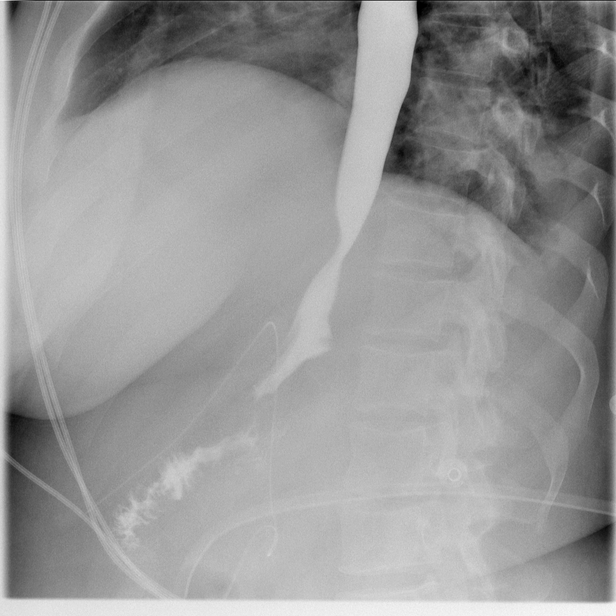
[frame 7/9]
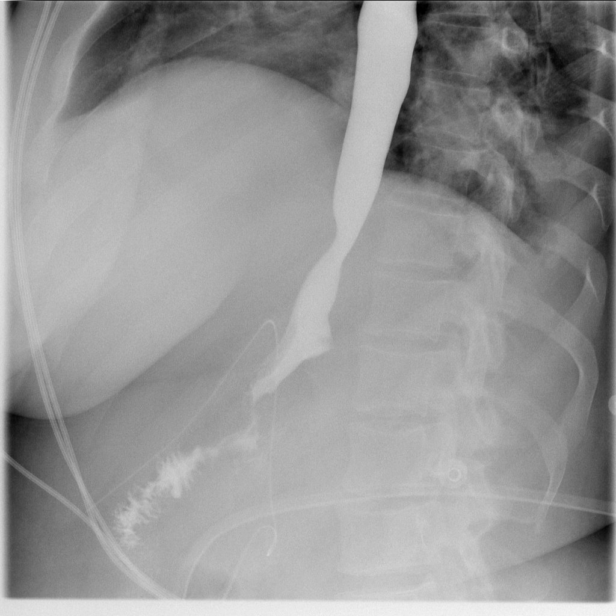

[Series 7: fluoro_barium 2fps_bw · 0.19mm/px · 2 of 7 frames shown (5 of 7)]
[frame 2/7]
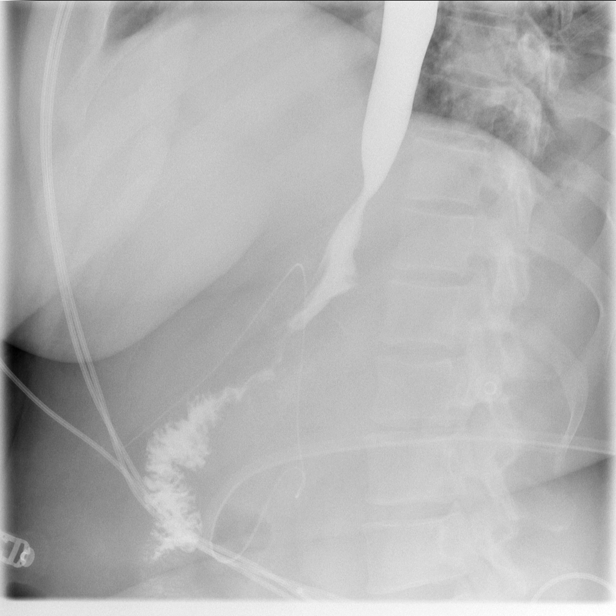
[frame 4/7]
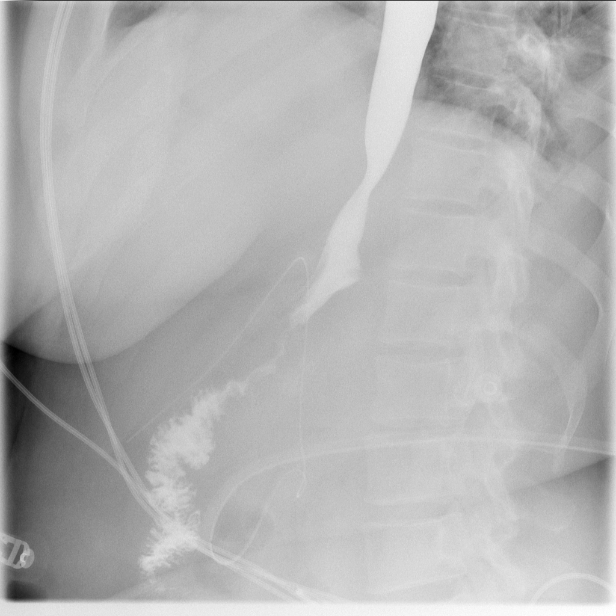

[Series 8: fluoro_barium 2fps_bw · 0.18mm/px · 1 of 8 frames shown (6 of 7)]
[frame 5/8]
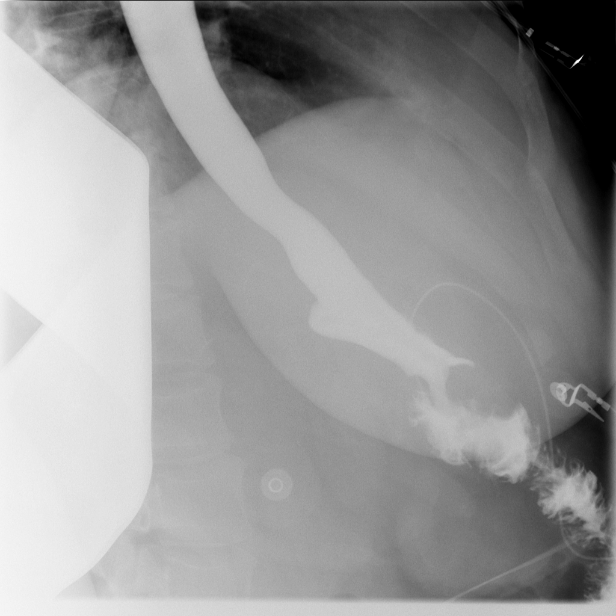

[Series 9: fluoro_barium 2fps_bw · 0.18mm/px · 1 of 1 slices shown (7 of 7)]
[im 1/1]
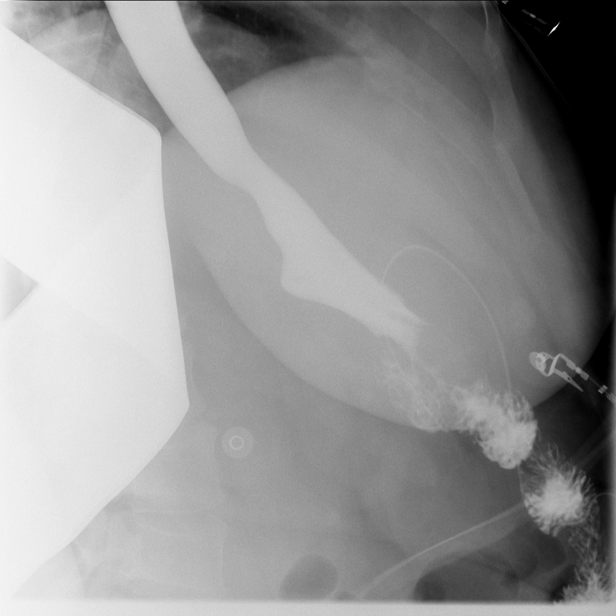

[Series 10: fluoro_barium singleshot_bw · 0.18mm/px · 2 of 3 slices shown]
[im 1/3]
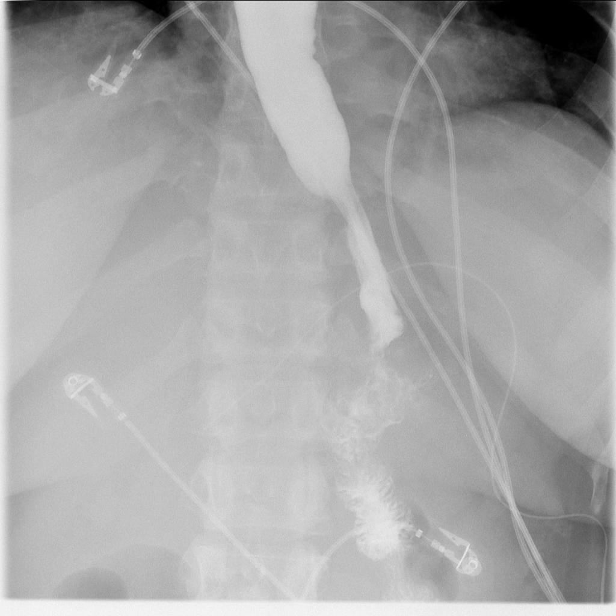
[im 3/3]
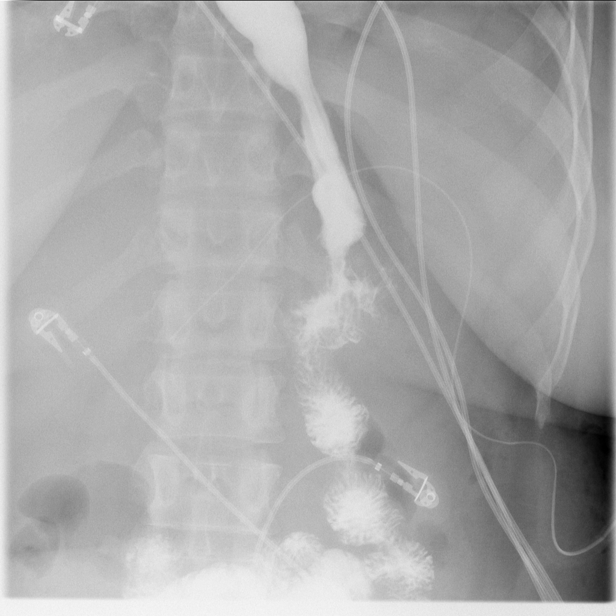

[15 of 24 positions shown; findings below may reference images not displayed]

FINDINGS: Initial scout radiograph demonstrated a surgical drain and bowel suture line
in the left upper quadrant. The contrast material passed freely through the
distal esophagus, into the gastric remnant, and into the anastomosed small
bowel. Some oral contrast material is seen within the interstices of the
small bowel at the anastomosis. No extravasation of oral contrast material
was seen. There is mild mass effect and thickening at the anastomosis of the
gastric remnant and small bowel, likely secondary to edema.
IMPRESSION: No evidence of the leak, status post gastric bypass.

## 2011-08-04 ENCOUNTER — Ambulatory Visit: Payer: Self-pay | Admitting: Bariatrics

## 2011-08-23 ENCOUNTER — Ambulatory Visit: Payer: Self-pay | Admitting: Internal Medicine

## 2011-09-04 HISTORY — PX: ABDOMINAL HYSTERECTOMY: SHX81

## 2011-12-03 ENCOUNTER — Ambulatory Visit: Payer: Self-pay | Admitting: Obstetrics and Gynecology

## 2011-12-03 LAB — BASIC METABOLIC PANEL
Anion Gap: 8 (ref 7–16)
BUN: 4 mg/dL — ABNORMAL LOW (ref 7–18)
Calcium, Total: 8.5 mg/dL (ref 8.5–10.1)
EGFR (Non-African Amer.): >60
Glucose: 66 mg/dL (ref 65–99)
Osmolality: 278 (ref 275–301)

## 2011-12-03 LAB — HEMOGLOBIN: HGB: 11.9 g/dL — ABNORMAL LOW (ref 12.0–16.0)

## 2011-12-14 ENCOUNTER — Ambulatory Visit: Payer: Self-pay | Admitting: Obstetrics and Gynecology

## 2011-12-14 LAB — PREGNANCY, URINE: Pregnancy Test, Urine: NEGATIVE m[IU]/mL

## 2011-12-15 LAB — HEMOGLOBIN: HGB: 10.6 g/dL — ABNORMAL LOW (ref 12.0–16.0)

## 2011-12-19 LAB — PATHOLOGY REPORT

## 2011-12-27 ENCOUNTER — Ambulatory Visit: Payer: Self-pay | Admitting: Bariatrics

## 2011-12-27 IMAGING — CT CT ABD-PELV W/ CM
1 of 2 series · 15 of 32 positions shown, 19 images · IV contrast (isovue)
Comparison: None

REASON FOR EXAM: RLQ abd pain  mass  eval hernia
COMMENTS:

PROCEDURE:     KCT - KCT ABDOMEN/PELVIS W  - [DATE] [DATE]
RESULT:     History: Right lower quadrant pain
TECHNIQUE: Multiple axial images of the abdomen and pelvis were performed
from the lung bases to the pubic symphysis, with p.o. contrast and with 100
ml of Isovue 300 intravenous contrast.

[Series 2: abd 3mm w 3.0 i40f 3 · axial · 0.97mm/px · z∈[-628,-220]mm · 15 of 154 slices shown, 19 images]
[im 12/154  soft-tissue]
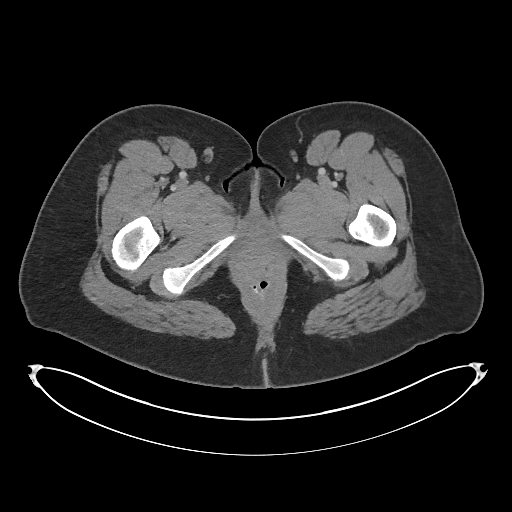
[im 12/154  bone]
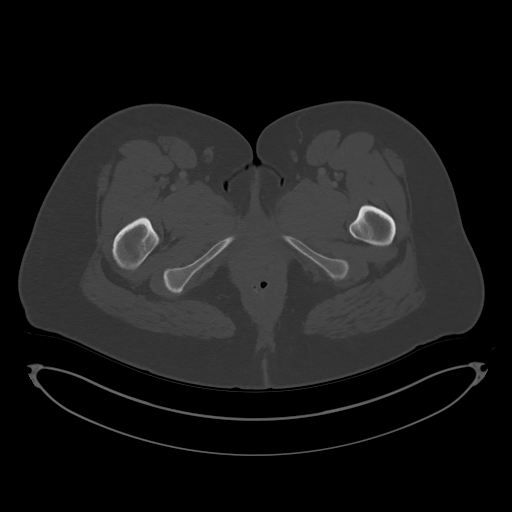
[im 23/154  soft-tissue]
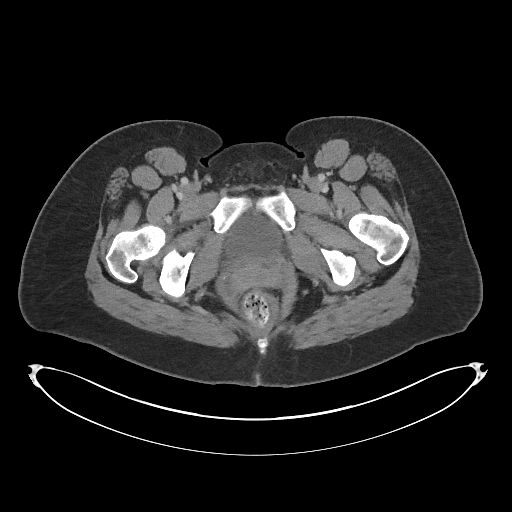
[im 35/154  soft-tissue]
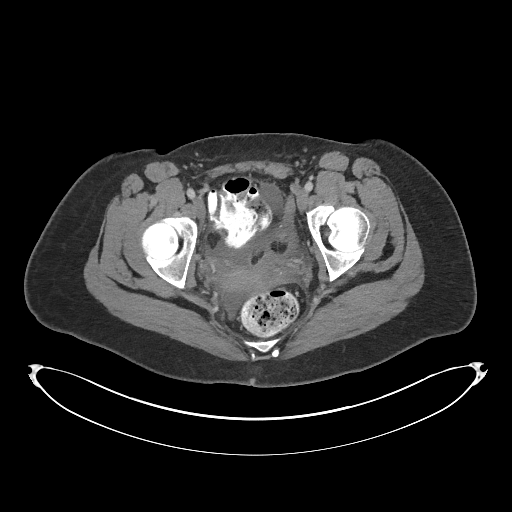
[im 46/154  soft-tissue]
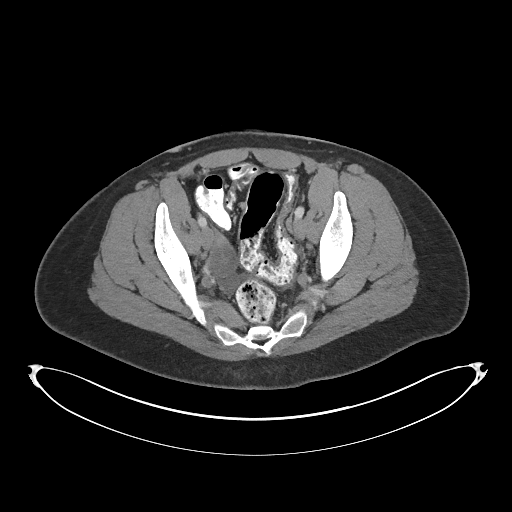
[im 57/154  soft-tissue]
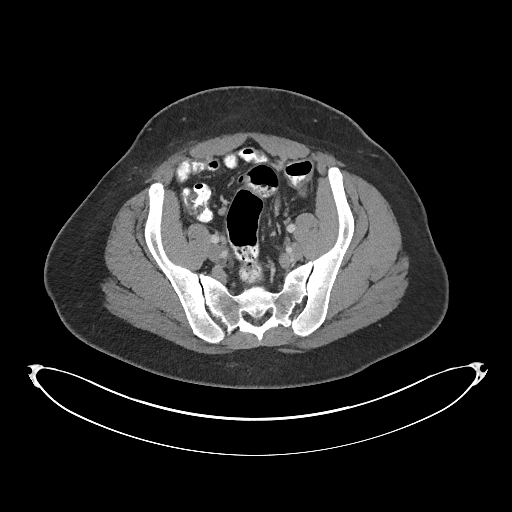
[im 69/154  soft-tissue]
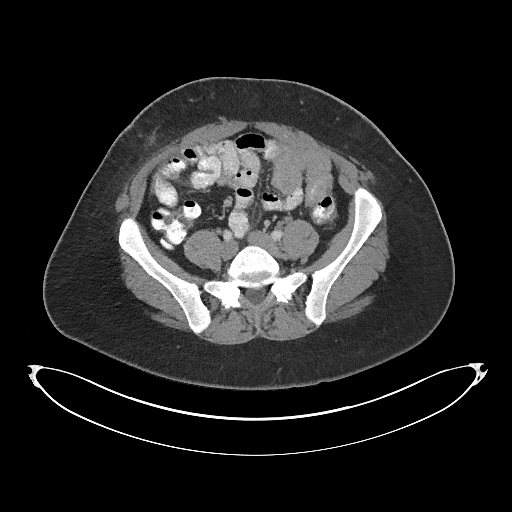
[im 80/154  soft-tissue]
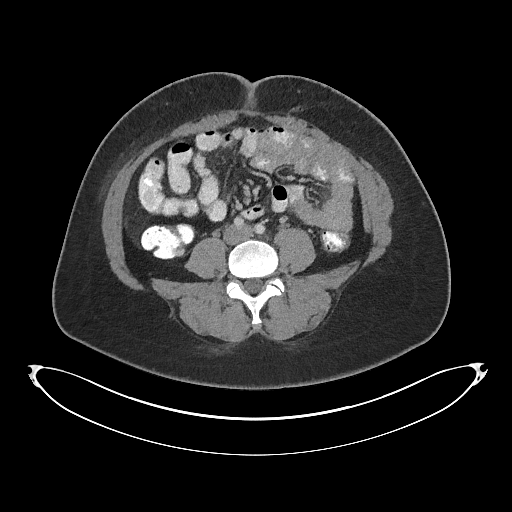
[im 91/154  soft-tissue]
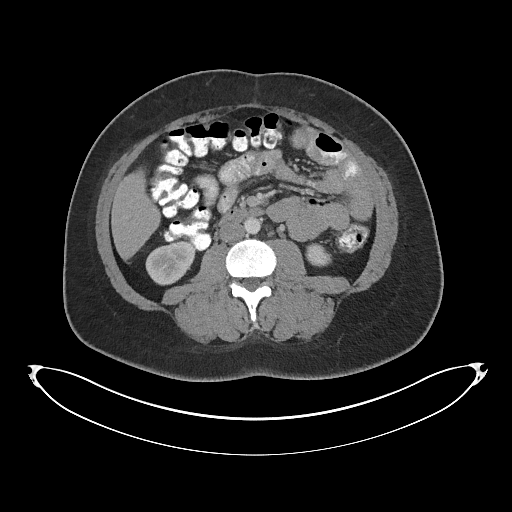
[im 103/154  soft-tissue]
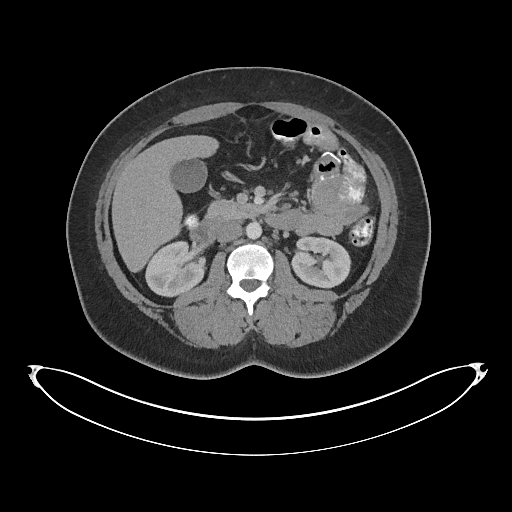
[im 103/154  bone]
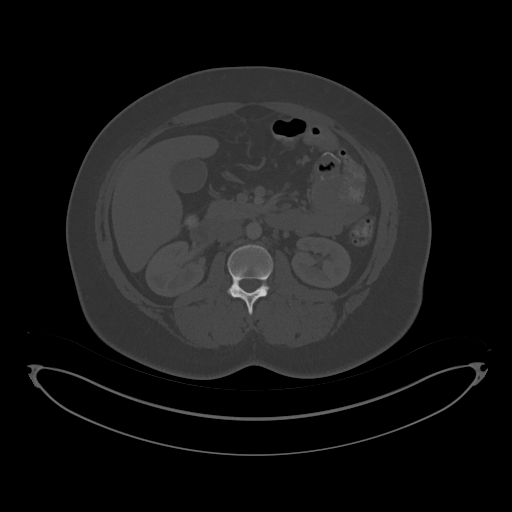
[im 114/154  soft-tissue]
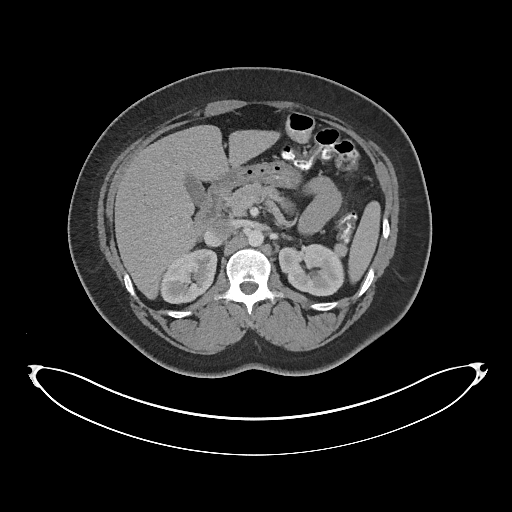
[im 125/154  soft-tissue]
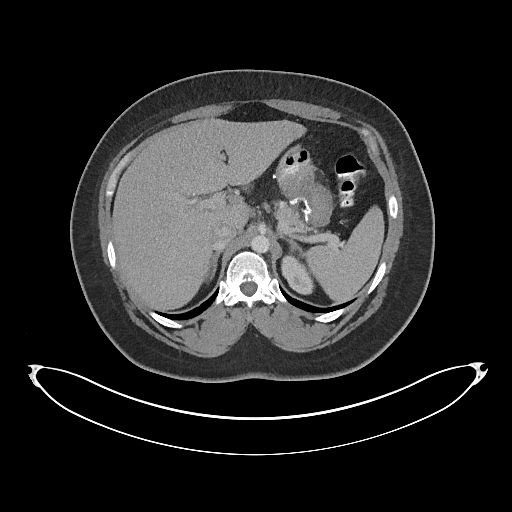
[im 131/154  lung]
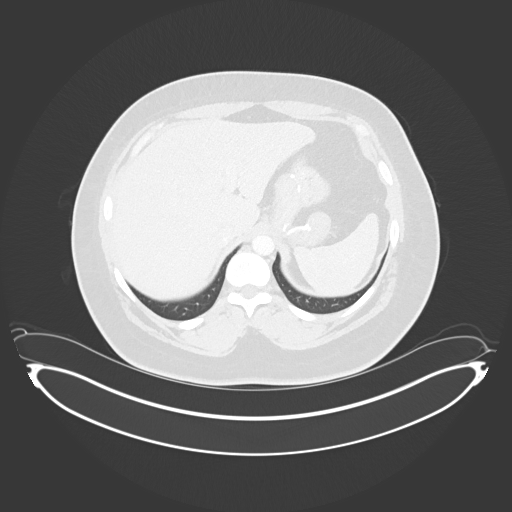
[im 137/154  soft-tissue]
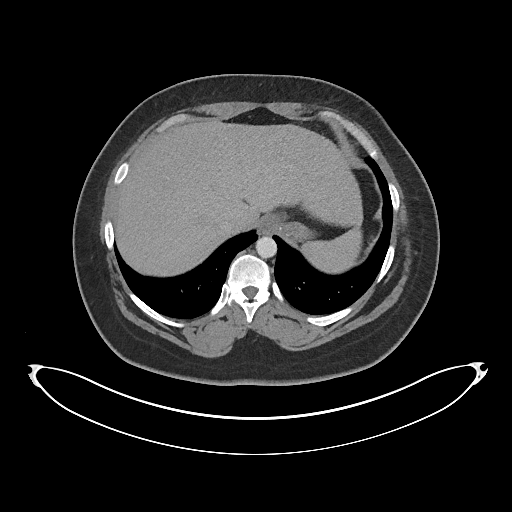
[im 137/154  lung]
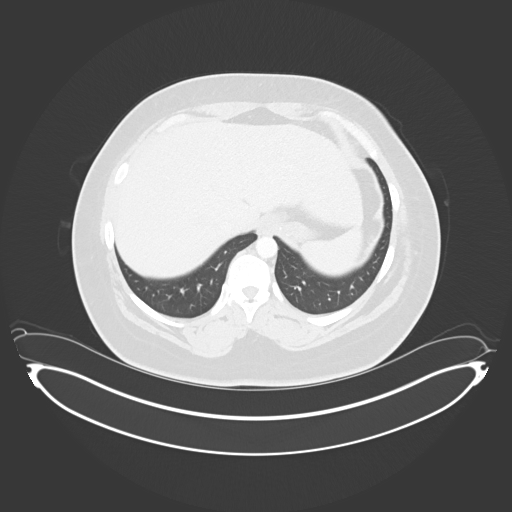
[im 142/154  lung]
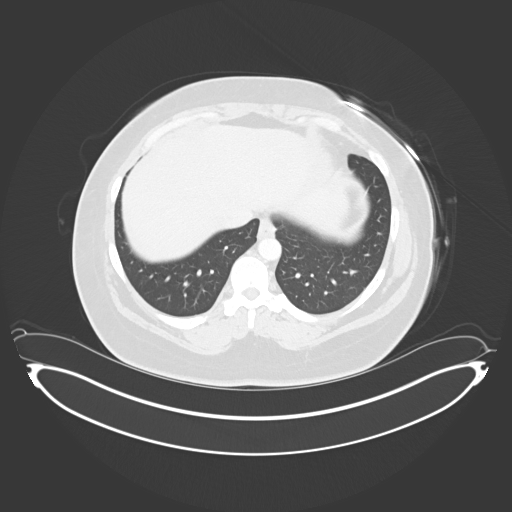
[im 148/154  soft-tissue]
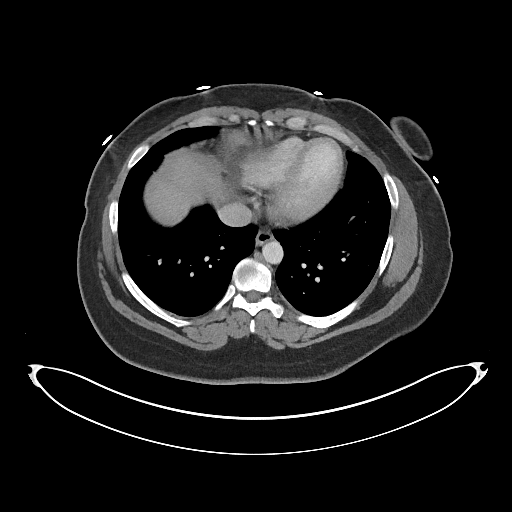
[im 148/154  lung]
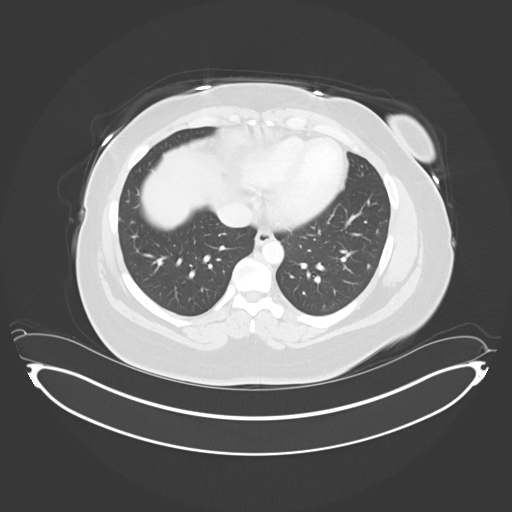

[15 of 32 positions shown; findings below may reference images not displayed]

FINDINGS: The lung bases are clear. There is no pneumothorax. The heart size is
normal.

The liver demonstrates no focal abnormality. There is no intrahepatic or
extrahepatic biliary ductal dilatation. The gallbladder is unremarkable. The
spleen demonstrates no focal abnormality. There are punctate nonobstructing
right renal calculi. The left kidney, adrenal glands, and pancreas are
normal. The bladder is unremarkable.

There is evidence of prior gastric bypass surgery the posterior changes in
the epigastric region. There is no pneumoperitoneum, pneumatosis, or portal
venous gas. There is no lymphadenopathy. There is a moderate amount of
pelvic free fluid. There are 2 peripherally enhancing hypodense masses
within the left ovary which is slightly enlarged which may represent
follicles versus hemorrhagic cysts. There is a normal caliber appendix in
the right lower quadrant without periappendiceal inflammatory changes.

The abdominal aorta is normal in caliber .

The osseous structures are unremarkable.
IMPRESSION: 1.  There is a moderate amount of pelvic free fluid. There are 2
peripherally enhancing hypodense masses within the left ovary which is
slightly enlarged which may represent follicles versus hemorrhagic cysts. If
there is further clinical concern recommend a pelvic ultrasound.

[REDACTED]

## 2012-06-25 ENCOUNTER — Ambulatory Visit: Payer: Self-pay | Admitting: Family Medicine

## 2012-09-04 ENCOUNTER — Ambulatory Visit: Payer: Self-pay

## 2012-09-05 ENCOUNTER — Ambulatory Visit: Payer: Self-pay

## 2012-09-05 IMAGING — US ULTRASOUND LEFT BREAST
1 series · 14 of 25 positions shown · non-contrast
Comparison: none

REASON FOR EXAM: av lt parenchymal density
COMMENTS:

[Series 1: ultrasound left breast · 0.09mm/px · 14 of 37 slices shown]
[im 1/37]
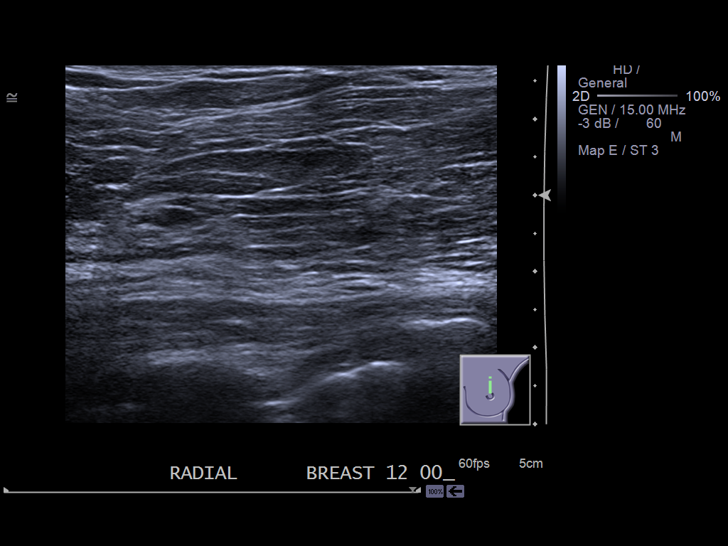
[im 4/37]
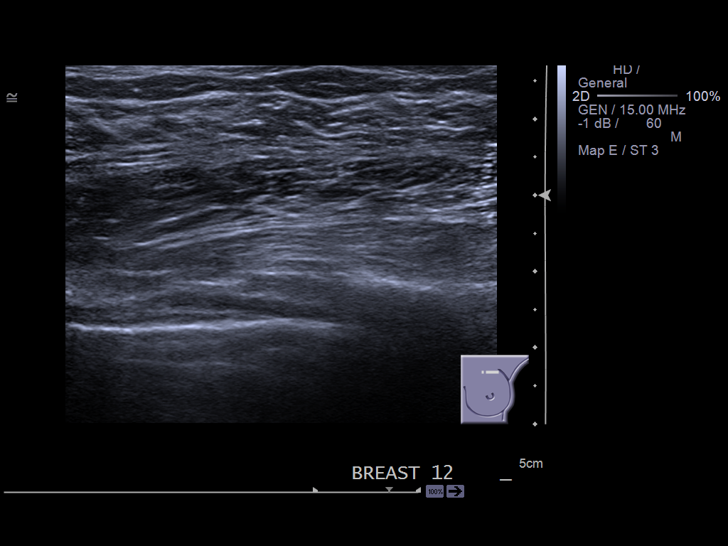
[im 7/37]
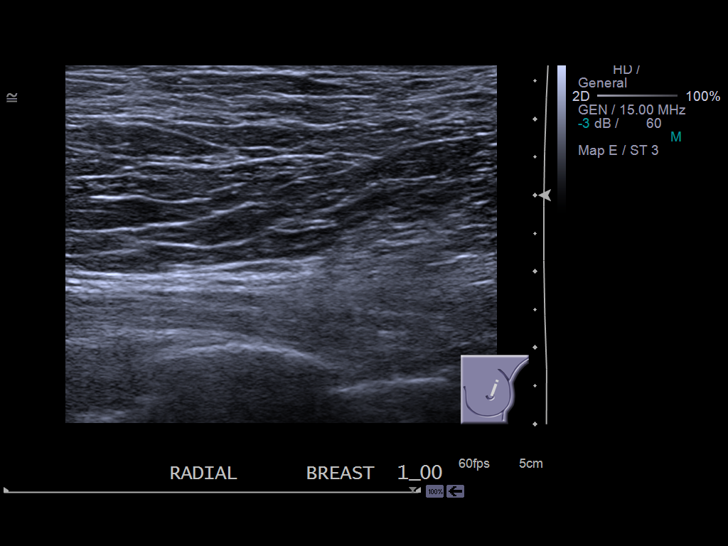
[im 10/37]
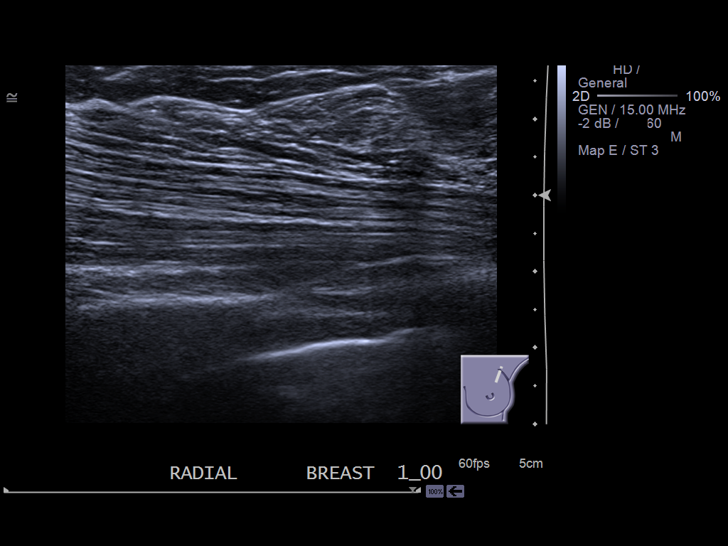
[im 13/37]
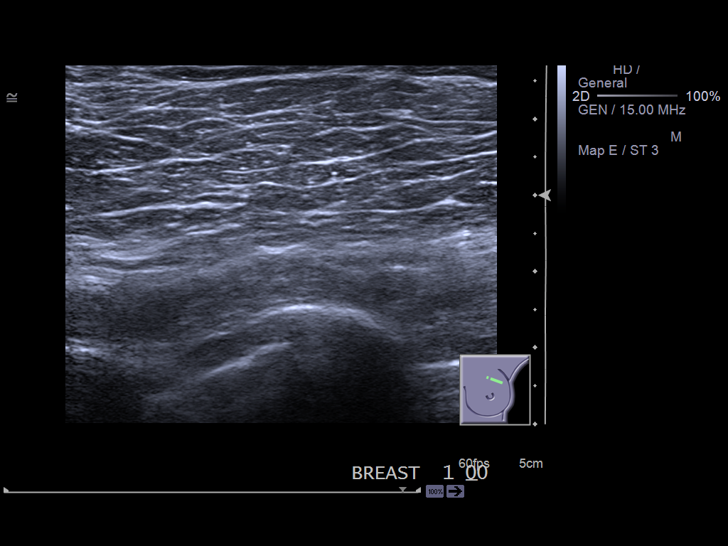
[im 14/37]
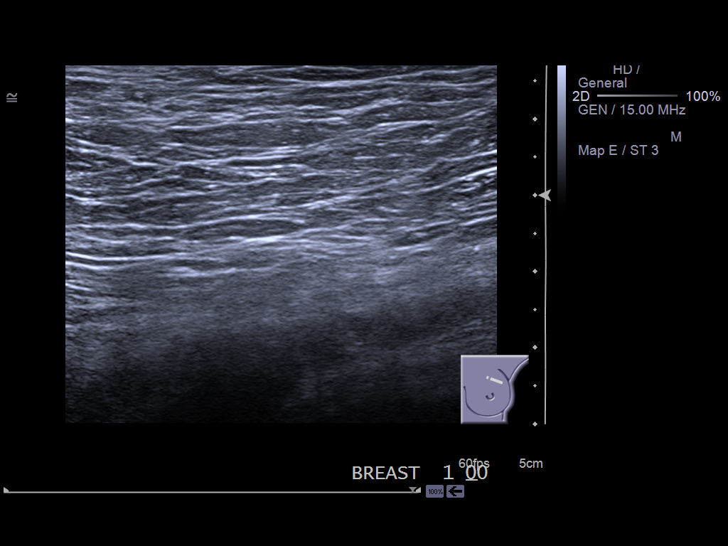
[im 17/37]
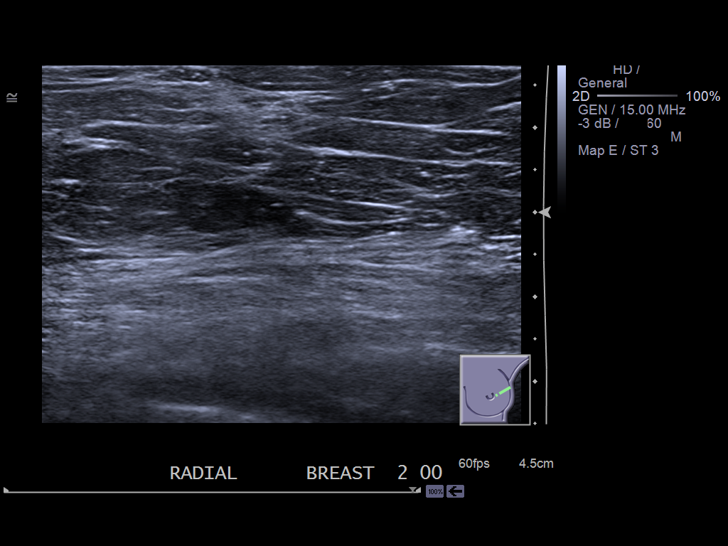
[im 20/37]
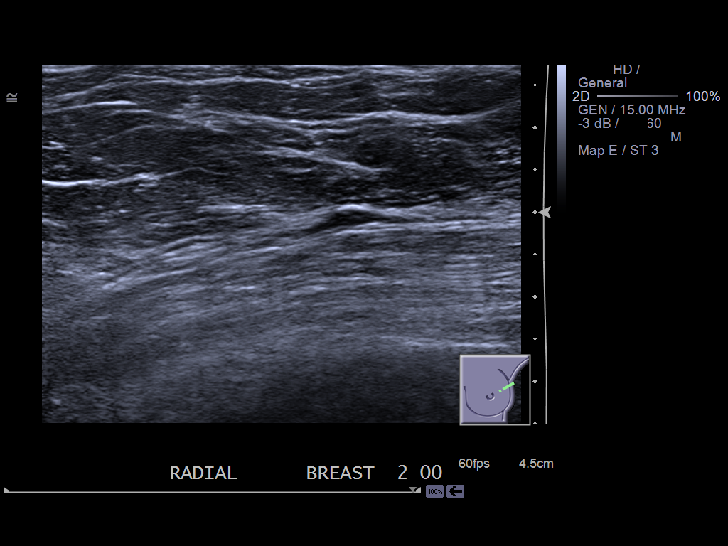
[im 23/37]
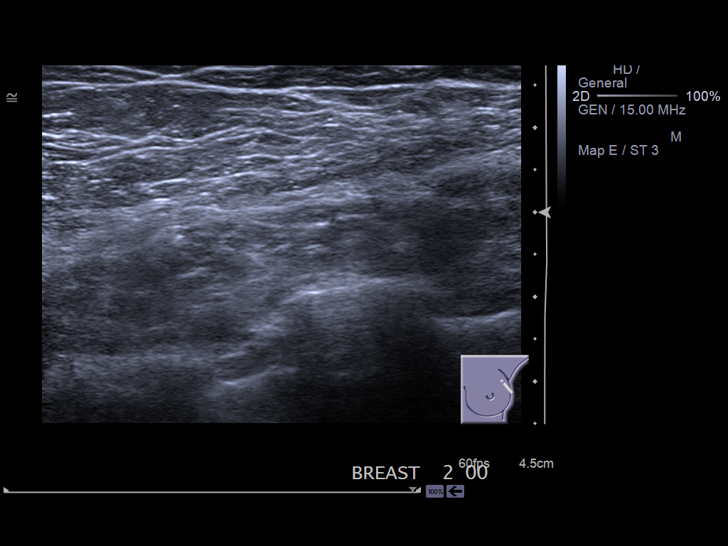
[im 25/37]
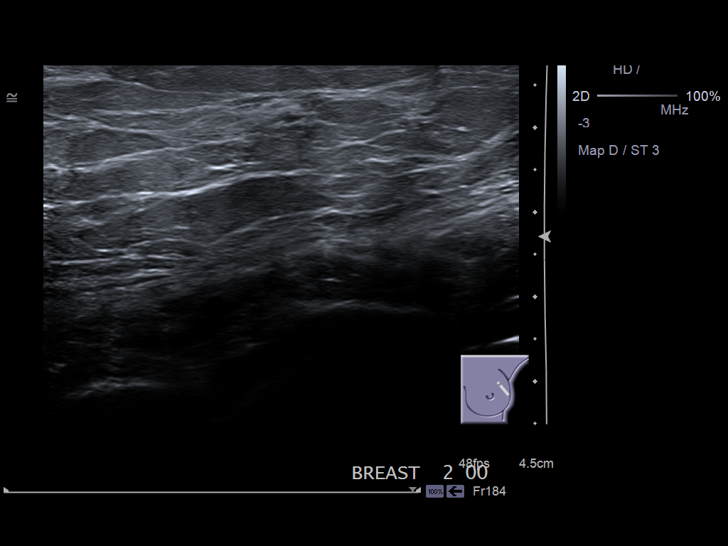
[im 28/37]
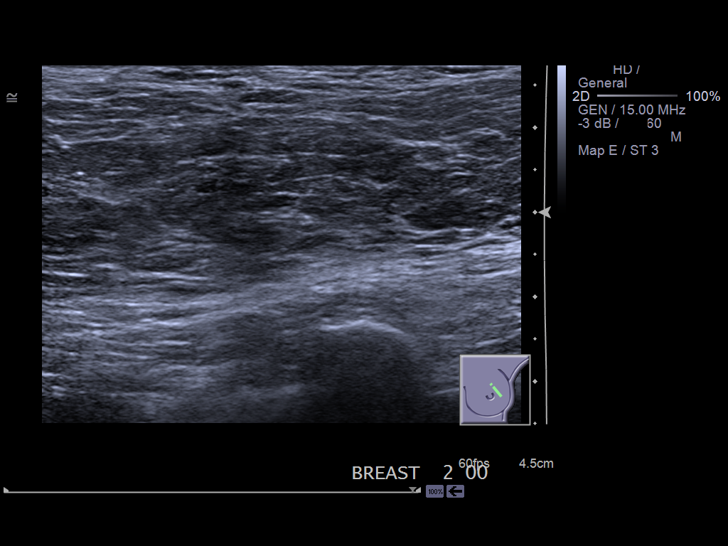
[im 31/37]
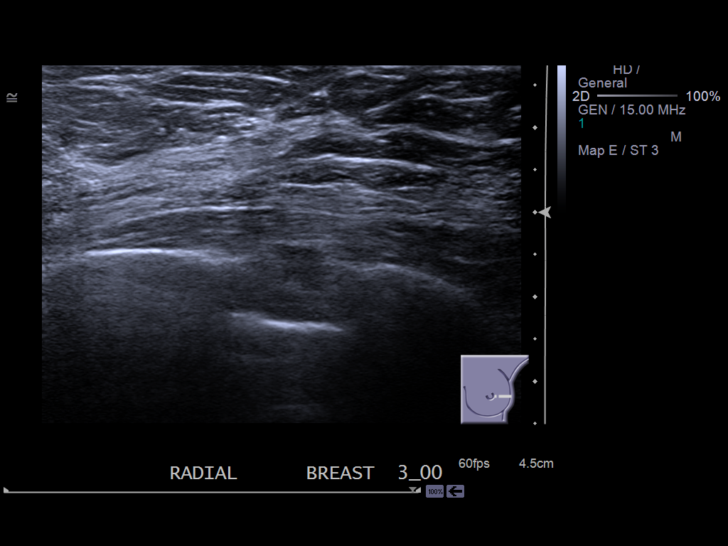
[im 34/37]
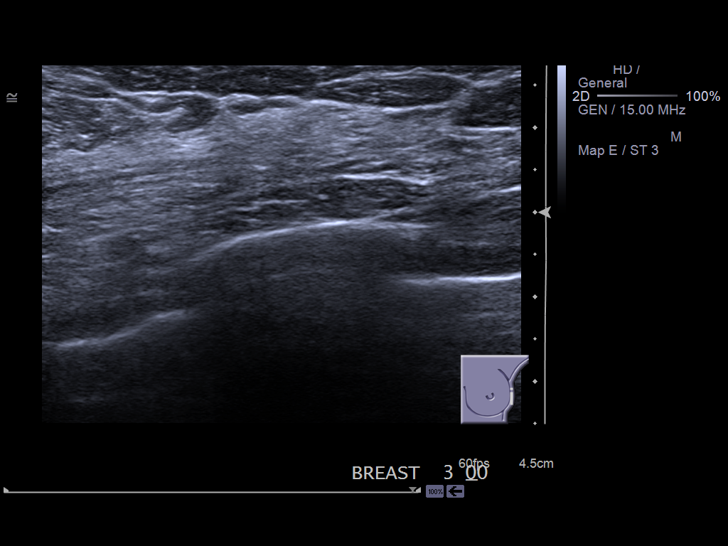
[im 37/37]
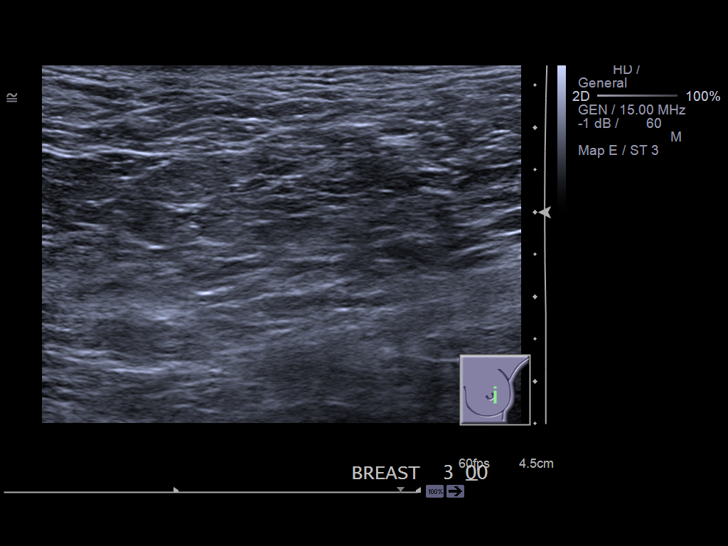

[14 of 25 positions shown; findings below may reference images not displayed]

PROCEDURE:     US  - US BREAST LEFT  - [DATE]  [DATE]

RESULT:     Ultrasound of the upper outer aspect of the left breast reveals
no focal abnormality. Nevertheless additional view mammography reveals a
persistent density in the upper outer aspect of the left breast. Surgical
consultation is suggested to consider needle localization and surgical
removal.
IMPRESSION: BI-RADS: Category 4 - Suspicious Abnormality - Biopsy
Should Be Considered

## 2012-11-18 ENCOUNTER — Ambulatory Visit: Payer: Self-pay

## 2012-11-20 LAB — BETA STREP CULTURE(ARMC)

## 2012-11-29 ENCOUNTER — Encounter: Payer: Self-pay | Admitting: General Surgery

## 2012-12-31 ENCOUNTER — Encounter: Payer: Self-pay | Admitting: *Deleted

## 2013-01-28 ENCOUNTER — Ambulatory Visit: Payer: Self-pay | Admitting: General Surgery

## 2013-01-29 ENCOUNTER — Encounter: Payer: Self-pay | Admitting: *Deleted

## 2013-10-14 ENCOUNTER — Emergency Department: Payer: Self-pay | Admitting: Internal Medicine

## 2014-04-25 ENCOUNTER — Emergency Department: Payer: Self-pay | Admitting: Emergency Medicine

## 2014-06-11 IMAGING — US US ABDOMEN LIMITED
1 series · 14 of 25 positions shown · non-contrast
Comparison: Abdominal pelvic CT scan [DATE] report of
abdominal ultrasound dated [DATE]

CLINICAL DATA: Epigastric pain for the past 6 months

EXAM:
US ABDOMEN LIMITED - RIGHT UPPER QUADRANT

[Series 1: us abdomen limited · 0.23mm/px · 14 of 39 slices shown]
[im 1/39]
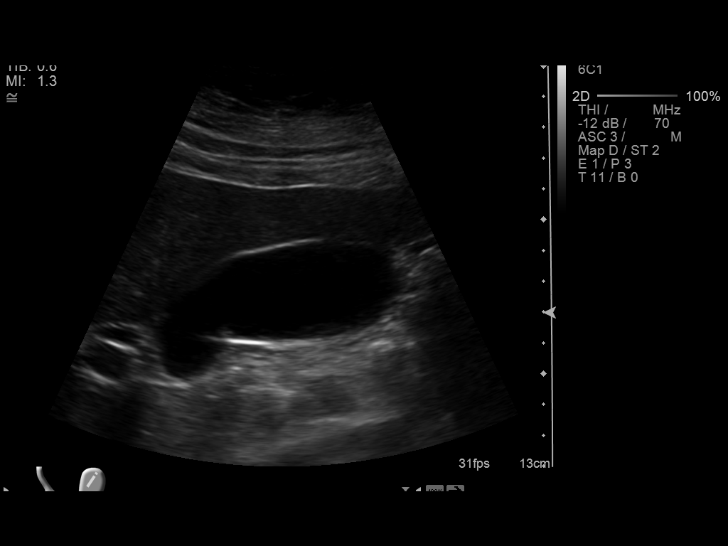
[im 4/39]
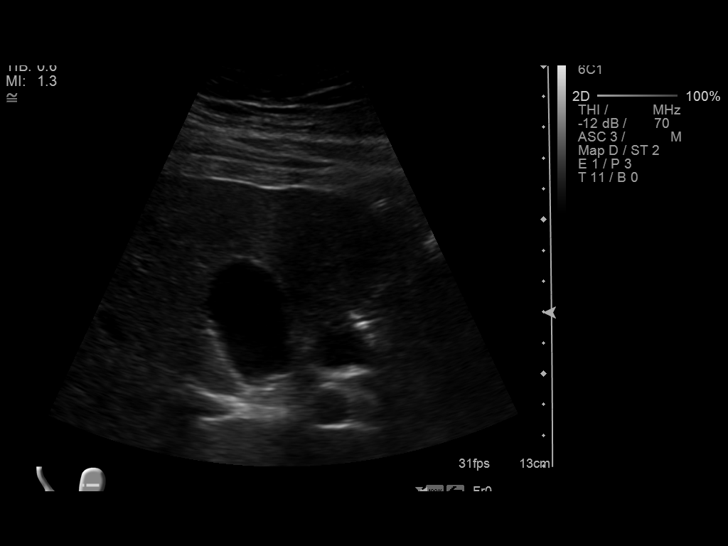
[im 7/39]
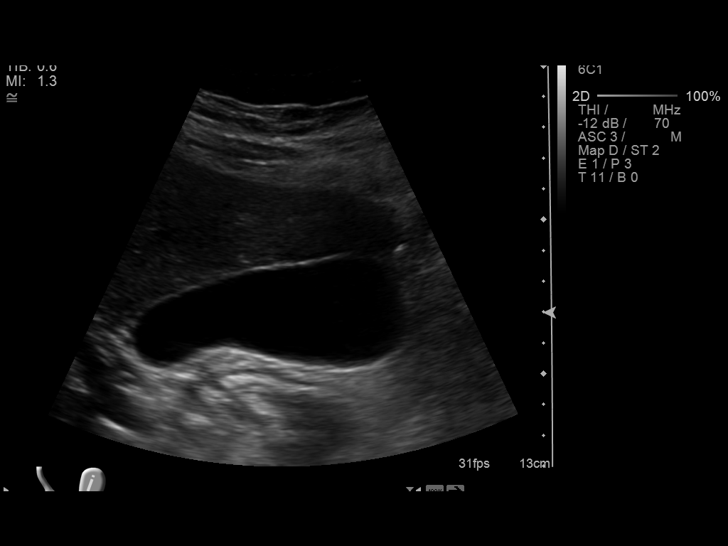
[im 10/39]
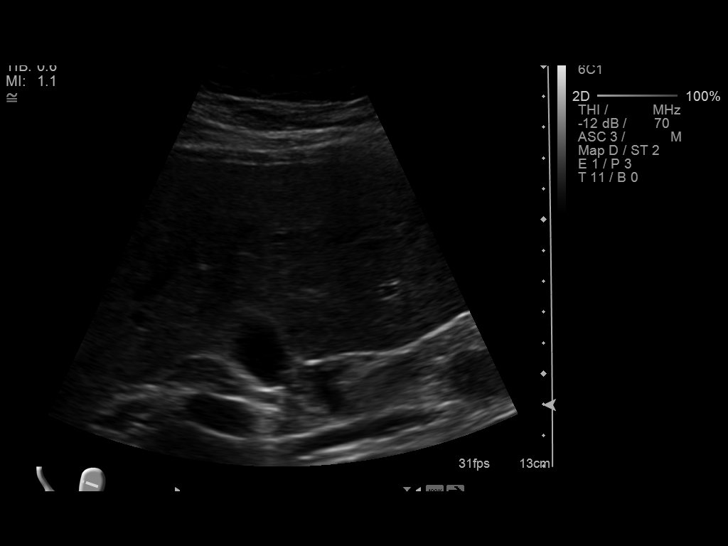
[im 13/39]
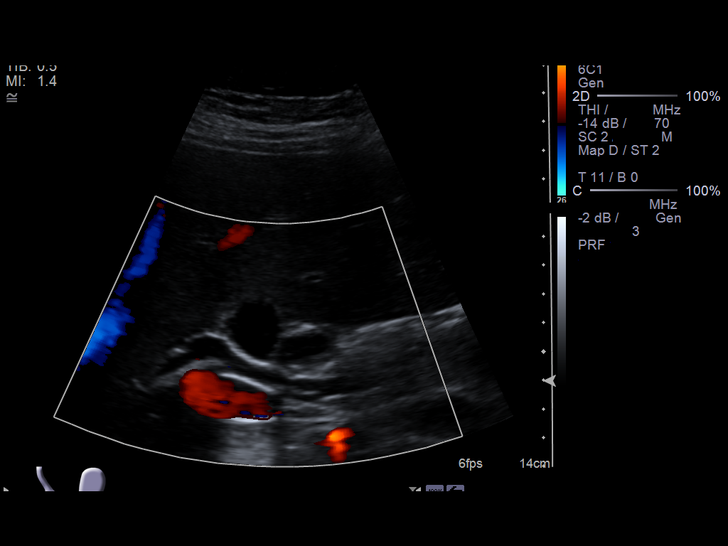
[im 15/39]
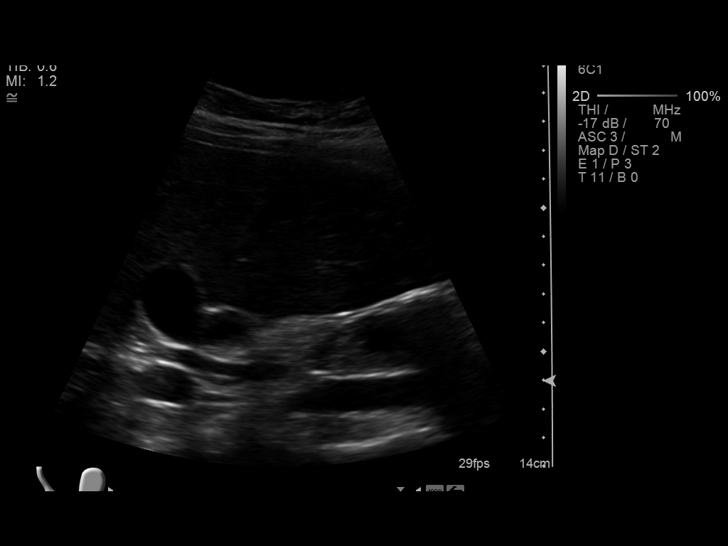
[im 18/39]
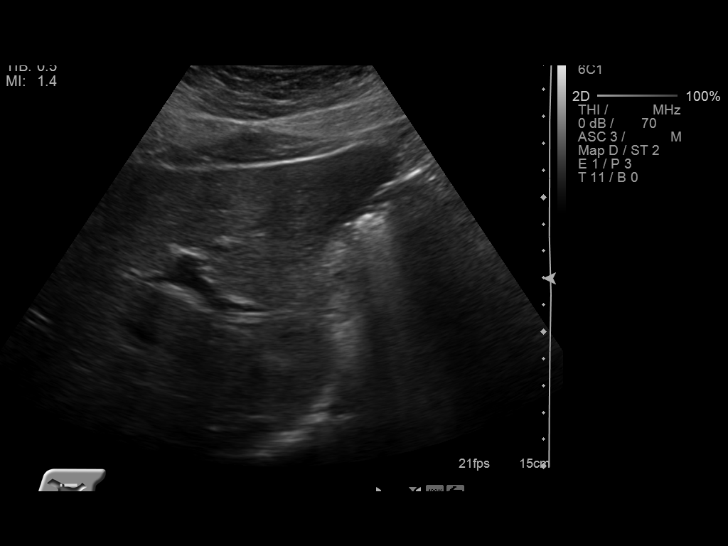
[im 21/39]
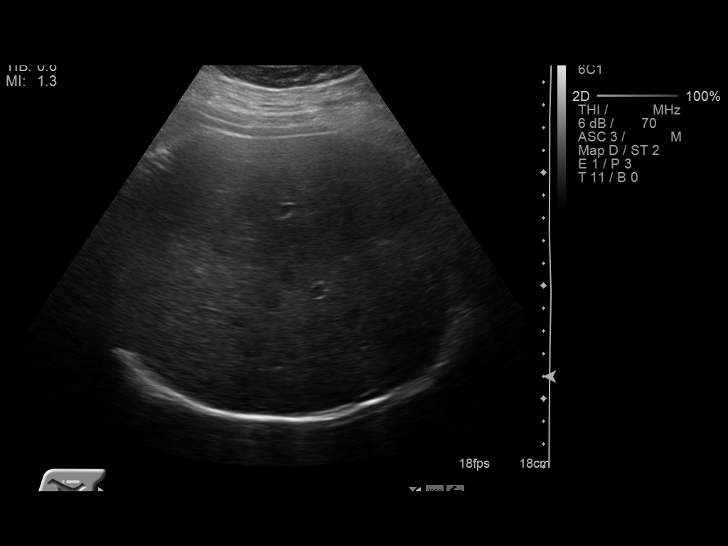
[im 24/39]
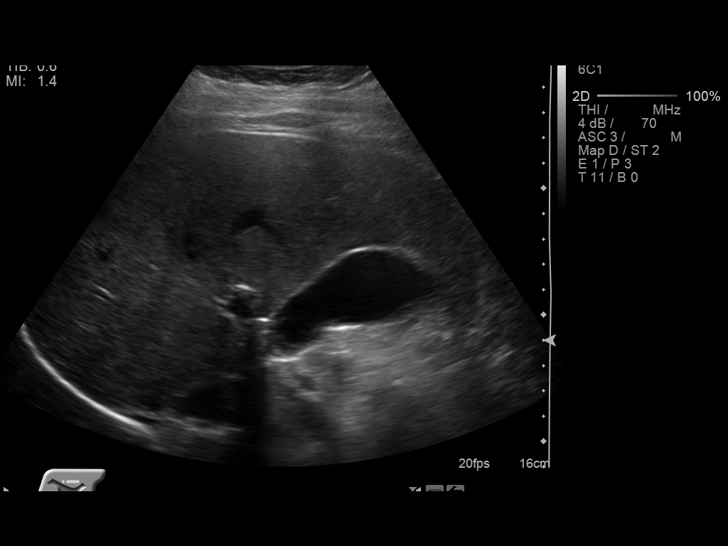
[im 26/39]
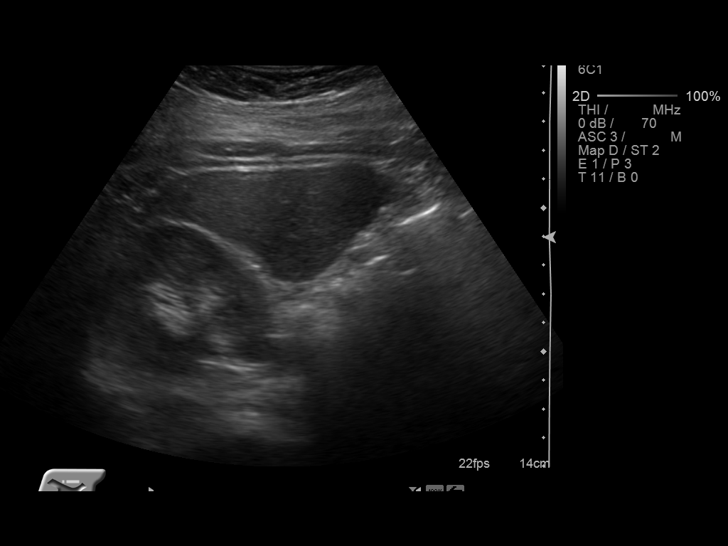
[im 29/39]
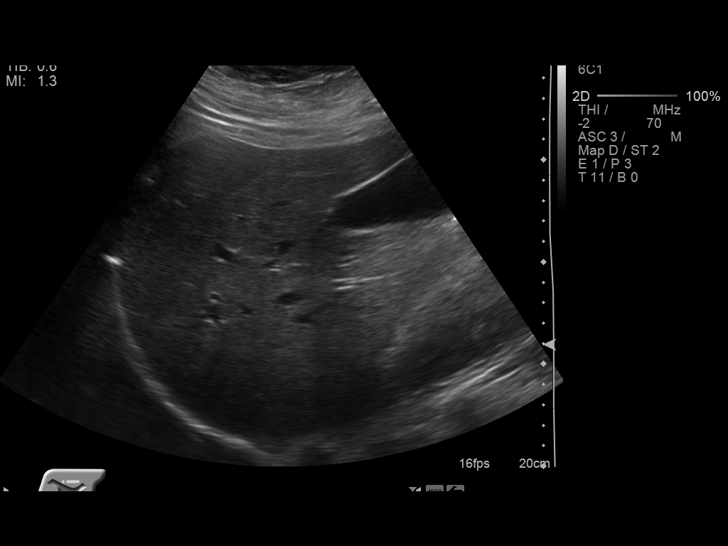
[im 32/39]
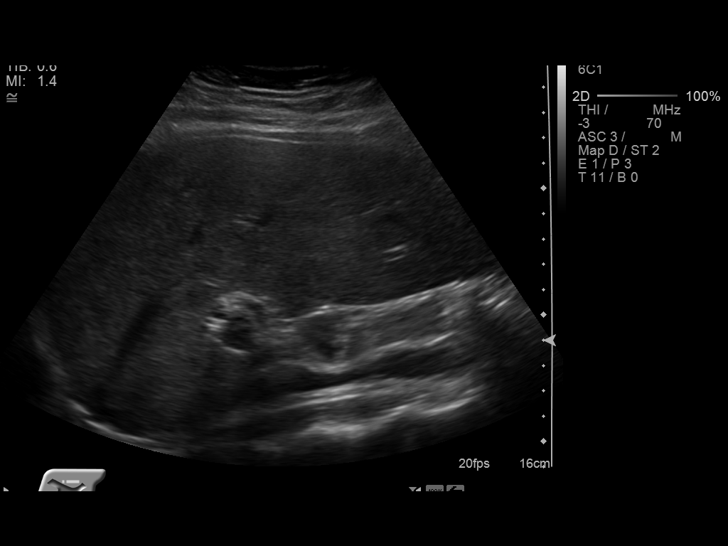
[im 35/39]
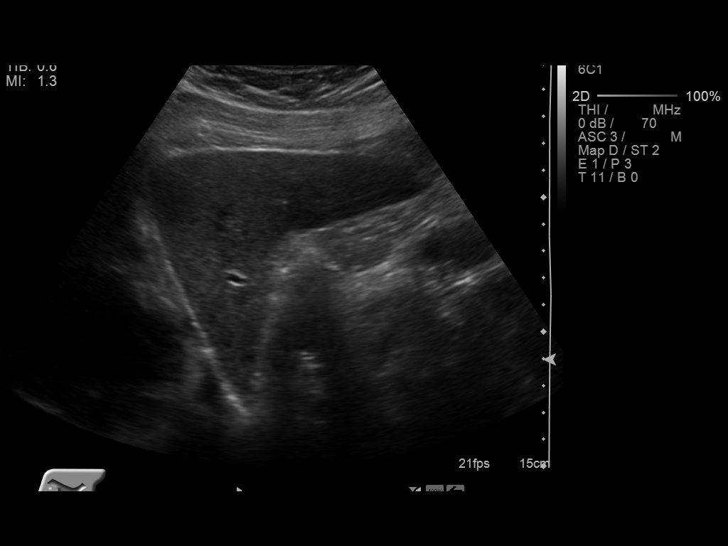
[im 39/39]
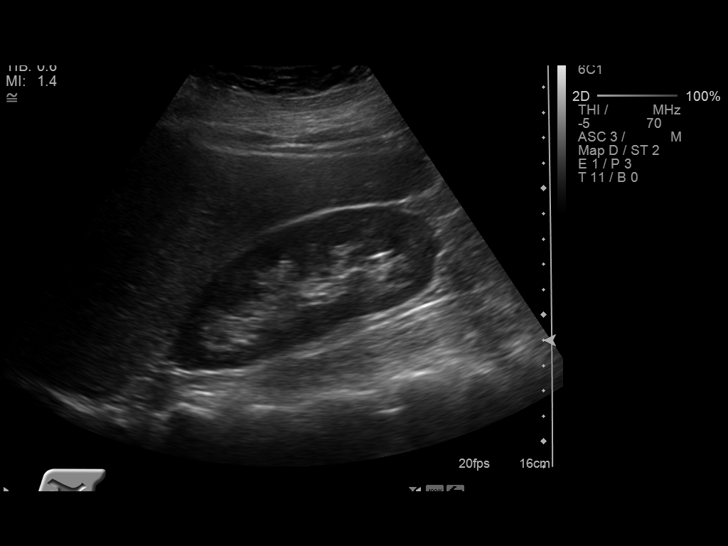

[14 of 25 positions shown; findings below may reference images not displayed]

FINDINGS: Gallbladder:

No gallstones or wall thickening visualized. No sonographic Murphy
sign noted.

Common bile duct:

Diameter: 5.6 mm

Liver:

No focal lesion identified. Within normal limits in parenchymal
echogenicity.
IMPRESSION: Normal limited right upper quadrant ultrasound examination.

## 2014-07-05 ENCOUNTER — Encounter: Payer: Self-pay | Admitting: *Deleted

## 2014-10-28 ENCOUNTER — Ambulatory Visit: Payer: Self-pay | Admitting: Emergency Medicine

## 2014-12-26 NOTE — Op Note (Signed)
PATIENT NAME:  Ashley Stephens, Ashley Stephens MR#:  [id number] DATE OF BIRTH:  [birth year]  DATE OF PROCEDURE:  12/14/2011  PREOPERATIVE DIAGNOSIS: Menorrhagia.   POSTOPERATIVE DIAGNOSIS: Menorrhagia.   PROCEDURE PERFORMED: Laparoscopic supracervical hysterectomy and bilateral salpingectomy.   SURGEON:  Stoney Bang. Georgianne Fick, MD  ASSISTANT: Erik Obey, M.D.   ANESTHESIA USED: General.   ESTIMATED BLOOD LOSS: 50 mL.  OPERATIVE FLUIDS: 1 liter of crystalloid.   URINE OUTPUT: 75 mL of clear urine.   COMPLICATIONS: None.   INTRAOPERATIVE FINDINGS: Normal appearing uterus, tubes, and ovaries. The patient has history of prior Roux-en-Y. There was no evidence of any adhesive disease from her prior procedure.   SPECIMENS REMOVED: Right tube, left tube as well as morcellated uterus without cervix.   PATIENT CONDITION FOLLOWING PROCEDURE: Stable.   DRAINS OR TUBES: Foley to gravity drainage.   IMPLANTS: Interceed.   PROCEDURE IN DETAIL: Risks, benefits, alternatives of the procedure were discussed with patient prior to proceeding to the Operating Room. Patient was taken the Operating Room, placed under general endotracheal anesthesia. She was positioned in the dorsal lithotomy position and prepped and draped in the usual sterile fashion. A timeout was performed. A sterile speculum was then placed at the anterior lip of the cervix, cervix was visualized, grasped with single tooth tenaculum and a Hulka tenaculum was then placed without difficulty. The single tooth tenaculum and speculum were then removed. Attention was turned to the patient's abdomen. The base of the umbilicus was infiltrated with approximately 4 mL of 0.25% Sensorcaine. A scalpel was then used to carry the incision down sharply to the level of the rectus fascia. The fascia was grasped with two hemostats, tented up and incised using the scalpel. The fascial edges were then tacked with 0 Vicryl on a GU needle. Following this the peritoneum  was identified, grasped, tented and incised using Mayo scissors. Following this it was confirmed that placement was intraperitoneally. A Hassan port was then placed and insufflation was begun. Following insufflation two lateral assistant ports, one 10 mm on the right and one 5 mm on the left were placed under direct visualization. General inspection of the abdomen and pelvis revealed grossly normal findings. Attention was then turned to the patient's right cornual region. This was grasped and the ovary was identified. The fallopian tube was then excised using 5 mm Harmonic scalpel. This was carried to the level of the cornual region. The utero-ovarian ligament and round ligament were then incised with the Harmonic scalpel. The anterior leaf of the broad ligament was skeletonized and taken down to the level of the internal cervical os. The right uterine artery was skeletonized and bladder flap was created. The same procedure was then completed on the patient's left. The fallopian tube was excised using the 5 mm Harmonic scalpel. The utero-ovarian and round ligaments were then incised using the Harmonic scalpel. Anterior leaf of the broad ligament was dissected down to the level of the internal cervical os. The bladder flap was further developed. The left uterine artery was skeletonized and cauterized using a bipolar energy source and then transected using the 5 mm Harmonic device. The bladder was noted to be sufficiently down off the cervix to allow transection of the specimen. The specimen was transected off the cervix using the Harmonic scalpel. Following the complete resection of the uterus the cervical stump was visualized and noted to be hemostatic. The internal os was cauterized with the bipolar in order to cauterize any lower uterine segment tissue that might  be still present. The 10 mm right assistant port was then switched out for the Gynecare morcellator. The uterine specimen was then morcellated without  difficulty. Following morcellation the abdomen was copiously irrigated and noted to be hemostatic. Aristo was applied over the cervical stump as well as pedicles. Following placement of the Aristo a small piece of Interceed was placed above the cervical stump. Next, attention was turned to the right assistant port and this was closed using a laparoscopic closure device with a 0 Vicryl stitch. Following this the remaining ports were removed under direct visualization. Pneumoperitoneum was evacuated. The umbilical port site fascial defect was closed using the previously placed stay sutures. Skin incisions were then closed with 4-0 Monocryl in subcuticular fashion. All of the trocar sites were then dressed with Dermabond. Sponge, needle, and instrument counts were correct x2. The patient was taken to recovery room in stable condition.  ____________________________ Stoney Bang. Georgianne Fick, MD ams:cms D: 12/16/2011 21:29:01 ET T: 12/17/2011 08:12:06 ET JOB#: [id number]  cc: Stoney Bang. Georgianne Fick, MD, <Dictator> Conan Bowens Madelon Lips MD ELECTRONICALLY SIGNED 12/19/2011 3:23

## 2015-01-21 ENCOUNTER — Encounter: Payer: Self-pay | Admitting: Emergency Medicine

## 2015-01-21 ENCOUNTER — Other Ambulatory Visit: Payer: Self-pay

## 2015-01-21 ENCOUNTER — Emergency Department
Admission: EM | Admit: 2015-01-21 | Discharge: 2015-01-21 | Disposition: A | Discharge status: Home / Self Care | Payer: 59 | Attending: Emergency Medicine | Admitting: Emergency Medicine

## 2015-01-21 DIAGNOSIS — R55 Syncope and collapse: Secondary | ICD-10-CM | POA: Diagnosis present

## 2015-01-21 DIAGNOSIS — E860 Dehydration: Secondary | ICD-10-CM | POA: Diagnosis not present

## 2015-01-21 LAB — COMPREHENSIVE METABOLIC PANEL
ALK PHOS: 59 U/L (ref 38–126)
ALT: 12 U/L — AB (ref 14–54)
AST: 19 U/L (ref 15–41)
Albumin: 3.9 g/dL (ref 3.5–5.0)
Anion gap: 13 (ref 5–15)
BILIRUBIN TOTAL: 0.2 mg/dL — AB (ref 0.3–1.2)
BUN: 35 mg/dL — AB (ref 6–20)
CO2: 21 mmol/L — ABNORMAL LOW (ref 22–32)
Calcium: 8.9 mg/dL (ref 8.9–10.3)
Chloride: 105 mmol/L (ref 101–111)
Creatinine, Ser: 0.85 mg/dL (ref 0.44–1.00)
GFR calc non Af Amer: >60 mL/min (ref >60)
GLUCOSE: 111 mg/dL — AB (ref 65–99)
Potassium: 3.3 mmol/L — ABNORMAL LOW (ref 3.5–5.1)
SODIUM: 139 mmol/L (ref 135–145)
TOTAL PROTEIN: 6.8 g/dL (ref 6.5–8.1)

## 2015-01-21 LAB — CBC
HCT: 37.1 % (ref 35.0–47.0)
Hemoglobin: 12.5 g/dL (ref 12.0–16.0)
MCH: 29.6 pg (ref 26.0–34.0)
MCHC: 33.8 g/dL (ref 32.0–36.0)
MCV: 87.6 fL (ref 80.0–100.0)
PLATELETS: 256 10*3/uL (ref 150–440)
RBC: 4.24 MIL/uL (ref 3.80–5.20)
RDW: 12.9 % (ref 11.5–14.5)
WBC: 5.6 10*3/uL (ref 3.6–11.0)

## 2015-01-21 LAB — TROPONIN I: Troponin I: <0.03 ng/mL (ref <0.031)

## 2015-01-21 MED ORDER — SODIUM CHLORIDE 0.9 % IV BOLUS (SEPSIS)
1000.0000 mL | Freq: Once | INTRAVENOUS | Status: AC
  Administered 2015-01-21: 1000 mL via INTRAVENOUS

## 2015-01-21 NOTE — ED Notes (Signed)
Vital signs stable. 

## 2015-01-21 NOTE — Discharge Instructions (Signed)
Near-Syncope Near-syncope (commonly known as near fainting) is sudden weakness, dizziness, or feeling like you might pass out. This can happen when getting up or while standing for a long time. It is caused by a sudden decrease in blood flow to the brain, which can occur for various reasons. Most of the reasons are not serious.  HOME CARE Watch your condition for any changes.  Have someone stay with you until you feel stable.  If you feel like you are going to pass out:  Lie down right away.  Prop your feet up if you can.  Breathe deeply and steadily.  Move only when the feeling has gone away. Most of the time, this feeling lasts only a few minutes. You may feel tired for several hours.  Drink enough fluids to keep your pee (urine) clear or pale yellow.  If you are taking blood pressure or heart medicine, stand up slowly.  Follow up with your doctor as told. GET HELP RIGHT AWAY IF:   You have a severe headache.  You have unusual pain in the chest, belly (abdomen), or back.  You have bleeding from the mouth or butt (rectum), or you have black or tarry poop (stool).  You feel your heart beat differently than normal, or you have a very fast pulse.  You pass out, or you twitch and shake when you pass out.  You pass out when sitting or lying down.  You feel confused.  You have trouble walking.  You are weak.  You have vision problems. MAKE SURE YOU:   Understand these instructions.  Will watch your condition.  Will get help right away if you are not doing well or get worse. Document Released: 02/06/2008 Document Revised: 08/25/2013 Document Reviewed: 01/23/2013 [facility] Patient Information 2015 [place], [place]. This information is not intended to replace advice given to you by your health care provider. Make sure you discuss any questions you have with your health care provider.

## 2015-01-21 NOTE — ED Notes (Signed)
From home via [facility], got dizzy in shower and had near syncopal event, no trauma, CBG 197, ST, 122/84

## 2015-01-21 NOTE — ED Provider Notes (Signed)
[facility] Emergency Department Provider Note  ____________________________________________  Time seen: 8:45 AM  I have reviewed the triage vital signs and the nursing notes.   HISTORY  Chief Complaint Near Syncope      HPI Ashley Stephens is a 39 y.o. female who presents after a near syncopal episode. She reports she got up this morning at 6 AM, was feeling well, and got into the shower area. she began feeling lightheaded in the shower and squatted down without relief.She then got out of the shower and walked to the kitchen and tried to drink a soda but felt more lightheaded. She then went outside and screamed for help. She denies chest pain. She rates the severity as 10 but feels well now and has no physical complaints. She is never had this before     Past Medical History  Diagnosis Date  . Thyroid disorder     There are no active problems to display for this patient.   Past Surgical History  Procedure Laterality Date  . Breast biopsy  1995  . Tubal ligation  1996  . Abdominal hysterectomy  2013  . Gastric bypass  2012    No current outpatient prescriptions on file.  Allergies Review of patient's allergies indicates no known allergies.  Family History  Problem Relation Age of Onset  . Breast cancer Maternal Grandmother     Social History History  Substance Use Topics  . Smoking status: Not on file  . Smokeless tobacco: Never Used  . Alcohol Use: No    Review of Systems  Constitutional: Negative for fever. Eyes: Negative for visual changes. ENT: Negative for sore throat. Cardiovascular: Negative for chest pain. Respiratory: Negative for shortness of breath. Gastrointestinal: Negative for abdominal pain, vomiting and diarrhea. Genitourinary: Negative for dysuria. Musculoskeletal: Negative for back pain. Skin: Negative for rash. Neurological: Negative for headaches, focal weakness or numbness. Psychiatric: Positive  anxiety  10-point ROS otherwise negative.  ____________________________________________   PHYSICAL EXAM:  VITAL SIGNS: ED Triage Vitals  Enc Vitals Group     BP 01/21/15 0847 108/74 mmHg     Pulse Rate 01/21/15 0847 124     Resp 01/21/15 0847 16     Temp 01/21/15 0855 98.3 F (36.8 C)     Temp Source 01/21/15 0855 Oral     SpO2 01/21/15 0847 98 %     Weight 01/21/15 0847 235 lb (106.595 kg)     Height 01/21/15 0847 5\' 6"  (1.676 m)     Head Cir --      Peak Flow --      Pain Score --      Pain Loc --      Pain Edu? --      Excl. in [place]? --      Constitutional: Alert and oriented. Well appearing and in no distress. Eyes: Conjunctivae are normal. PERRL. Normal extraocular movements. ENT   Head: Normocephalic and atraumatic.   Nose: No congestion/rhinnorhea.   Mouth/Throat: Mucous membranes are moist.   Cardiovascular: Normal rate, regular rhythm. Normal and symmetric distal pulses are present in all extremities. No murmurs, rubs, or gallops. Respiratory: Normal respiratory effort without tachypnea nor retractions. Breath sounds are clear and equal bilaterally. No wheezes/rales/rhonchi. Gastrointestinal: Soft and nontender. No distention. There is no CVA tenderness. Genitourinary: deferred Musculoskeletal: Nontender with normal range of motion in all extremities. No joint effusions.  No lower extremity tenderness nor edema. Neurologic:  Normal speech and language. No gross focal neurologic  deficits are appreciated. Speech is normal.  Skin:  Skin is warm, dry and intact. No rash noted. Psychiatric: Mood and affect are normal although patient clearly anxious  ____________________________________________    LABS (pertinent positives/negatives)  Significant for a elevated BUN and mild hypokalemia  ____________________________________________   EKG EKG interpreted by me:   Date: 01/21/2015  Rate: 103  Rhythm: Sinus tachycardia  QRS Axis: normal   Intervals: normal  ST/T Wave abnormalities: normal  Conduction Disutrbances: none  Narrative Interpretation: Sinus tachycardia, mild      ____________________________________________    RADIOLOGY  None  ____________________________________________   PROCEDURES  Procedure(s) performed: None Critical Care performed: None  ____________________________________________   INITIAL IMPRESSION / ASSESSMENT AND PLAN / ED COURSE  Pertinent labs & imaging results that were available during my care of the patient were reviewed by me and considered in my medical decision making (see chart for details).  Patient well-appearing, exam only significant for mild tachycardia. Suspect vasovagal response however we will check blood work and give normal saline 1 L IV and reevaluate  ____________________________________________ ----------------------------------------- 10:36 AM on 01/21/2015 -----------------------------------------  Patient feels significantly better. Heart rate 82. No dizziness. Suspect vasovagal reaction secondary to hot shower and dehydration  FINAL CLINICAL IMPRESSION(S) / ED DIAGNOSES  Final diagnoses:  Vasovagal reaction  Dehydration     Lavonia Drafts, MD 01/21/15 1036

## 2015-02-21 ENCOUNTER — Other Ambulatory Visit
Admission: RE | Admit: 2015-02-21 | Discharge: 2015-02-21 | Disposition: A | Discharge status: Home / Self Care | Payer: 59 | Source: Ambulatory Visit | Attending: Nurse Practitioner | Admitting: Nurse Practitioner

## 2015-02-21 DIAGNOSIS — E039 Hypothyroidism, unspecified: Secondary | ICD-10-CM | POA: Diagnosis not present

## 2015-02-21 LAB — T4, FREE: Free T4: 0.92 ng/dL (ref 0.61–1.12)

## 2015-02-21 LAB — TSH: TSH: 1.153 u[IU]/mL (ref 0.350–4.500)

## 2015-02-22 LAB — T3: T3, Total: 99 ng/dL (ref 71–180)

## 2015-04-25 ENCOUNTER — Other Ambulatory Visit: Payer: Self-pay | Admitting: Nurse Practitioner

## 2015-04-25 ENCOUNTER — Ambulatory Visit
Admission: RE | Admit: 2015-04-25 | Discharge: 2015-04-25 | Disposition: A | Discharge status: Home / Self Care | Payer: Managed Care, Other (non HMO) | Source: Ambulatory Visit | Attending: Nurse Practitioner | Admitting: Nurse Practitioner

## 2015-04-25 DIAGNOSIS — R52 Pain, unspecified: Secondary | ICD-10-CM

## 2015-04-25 DIAGNOSIS — M79662 Pain in left lower leg: Secondary | ICD-10-CM | POA: Diagnosis not present

## 2015-04-25 IMAGING — CR DG TIBIA/FIBULA 2V*L*
1 series · 2 of 2 positions shown · non-contrast
Comparison: None.

CLINICAL DATA: Cyst.

EXAM:
LEFT TIBIA AND FIBULA - 2 VIEW

[Series 1: dg tibia/fibula left · 0.14mm/px · 2 of 2 slices shown]
[im 1/2]
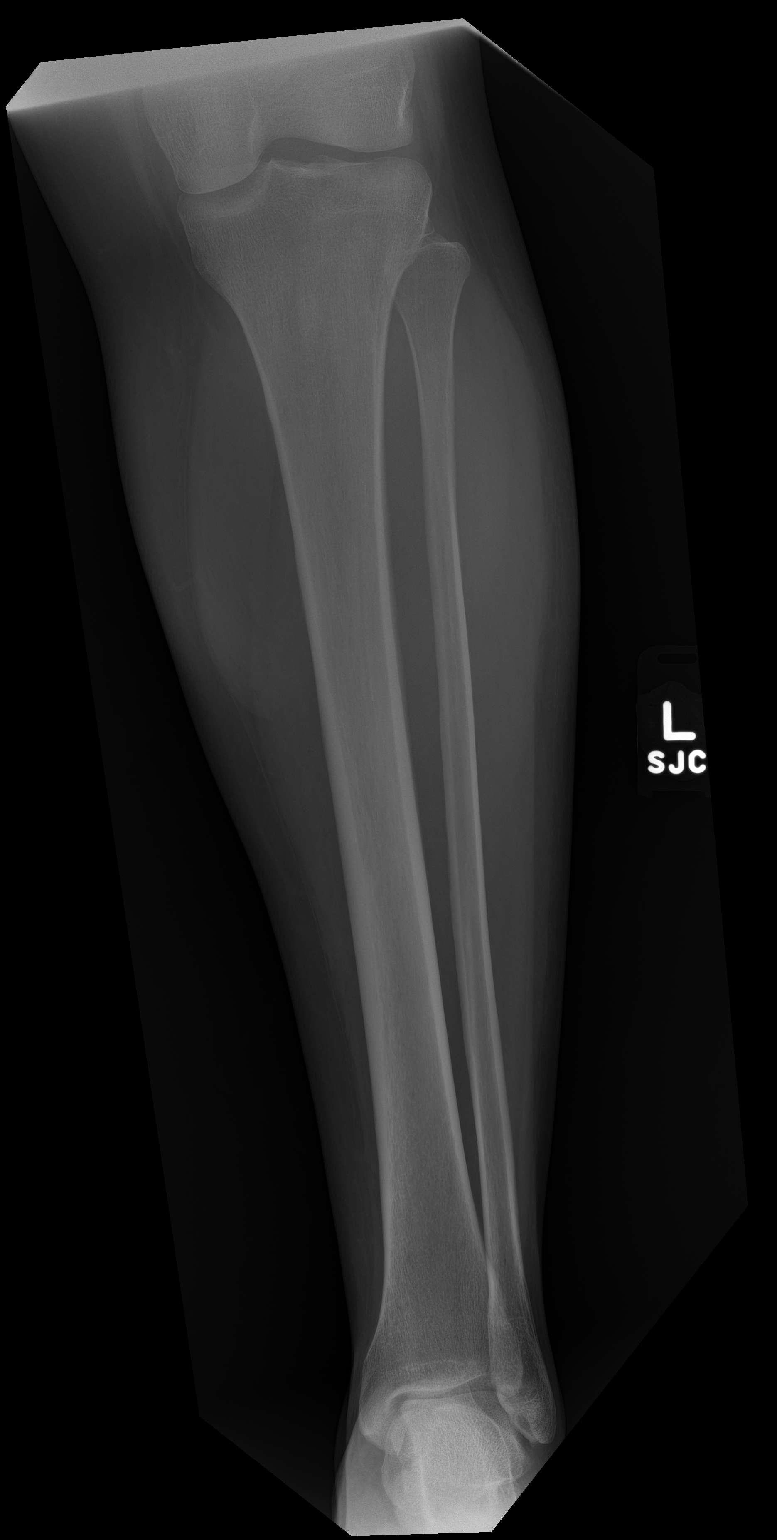
[im 2/2]
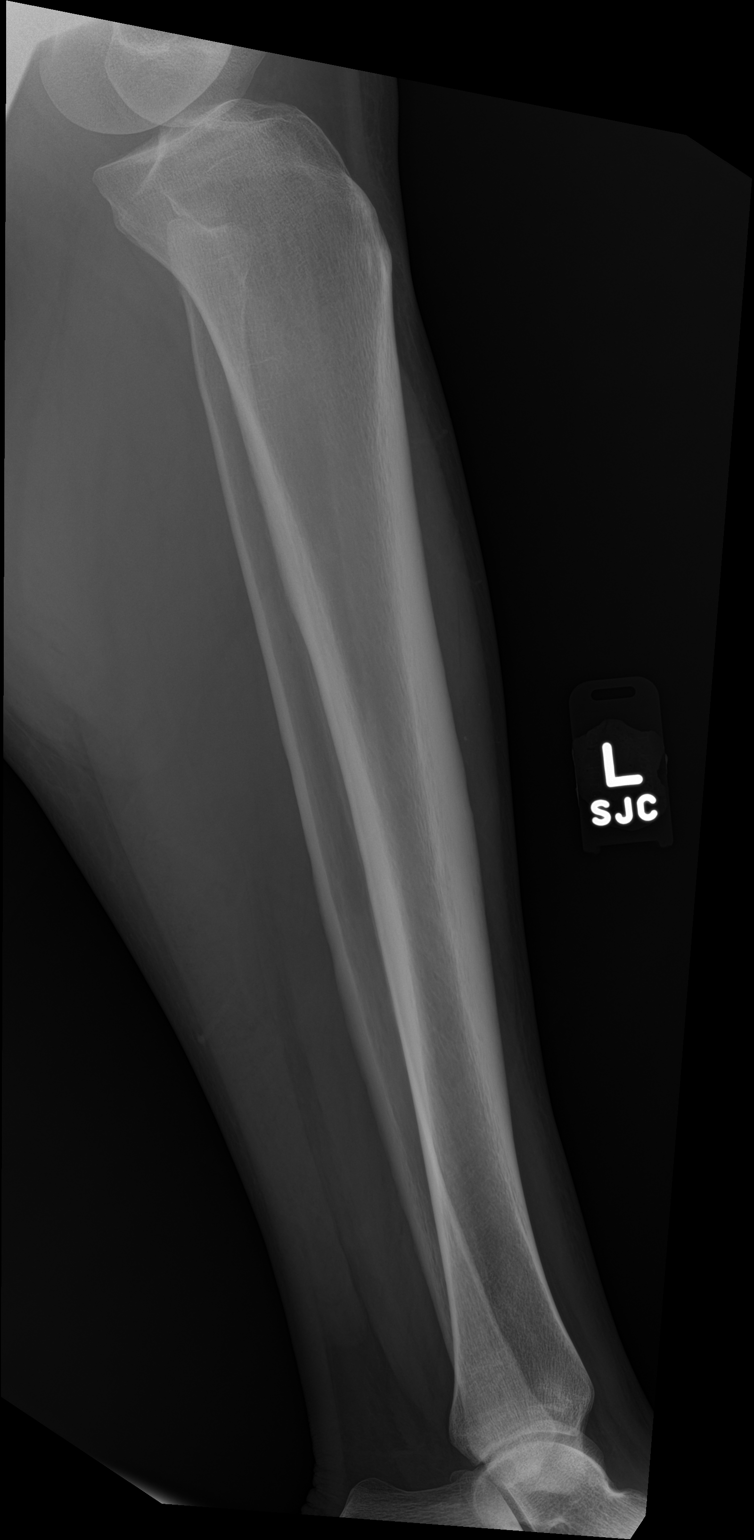

[2 of 2 positions shown; findings below may reference images not displayed]

FINDINGS: No acute bony or joint abnormality identified. No evidence of
fracture or dislocation. No radiopaque foreign body . If symptoms
persist MRI of the left lower extremity can be performed.
IMPRESSION: No acute or focal abnormality.

## 2015-05-04 ENCOUNTER — Other Ambulatory Visit: Payer: Self-pay | Admitting: Bariatrics

## 2015-05-04 DIAGNOSIS — R1013 Epigastric pain: Secondary | ICD-10-CM

## 2015-05-04 DIAGNOSIS — R1084 Generalized abdominal pain: Secondary | ICD-10-CM

## 2015-05-12 ENCOUNTER — Ambulatory Visit
Admission: RE | Admit: 2015-05-12 | Discharge: 2015-05-12 | Disposition: A | Discharge status: Home / Self Care | Payer: Managed Care, Other (non HMO) | Source: Ambulatory Visit | Attending: Bariatrics | Admitting: Bariatrics

## 2015-05-12 DIAGNOSIS — R1084 Generalized abdominal pain: Secondary | ICD-10-CM

## 2015-05-12 DIAGNOSIS — R1013 Epigastric pain: Secondary | ICD-10-CM

## 2015-05-12 DIAGNOSIS — Z9884 Bariatric surgery status: Secondary | ICD-10-CM | POA: Insufficient documentation

## 2015-05-12 IMAGING — RF DG UGI W/ SMALL BOWEL
15 of 19 series · 15 of 19 positions shown · non-contrast
Comparison: CT [DATE].

CLINICAL DATA: Gastric bypass.  Flank and epigastric pain.

EXAM:
UPPER GI SERIES WITH SMALL BOWEL FOLLOW-THROUGH
FLUOROSCOPY TIME:  Radiation Exposure Index (as provided by the
fluoroscopic device): 125 mGy
TECHNIQUE: Combined double contrast and single contrast upper GI series using
thin barium. Subsequently, serial images of the small bowel were
obtained including spot views of the terminal ileum.

[Series 1: t abdomen supine · 0.15mm/px · 1 of 1 slices shown (1 of 2)]
[im 1/1]
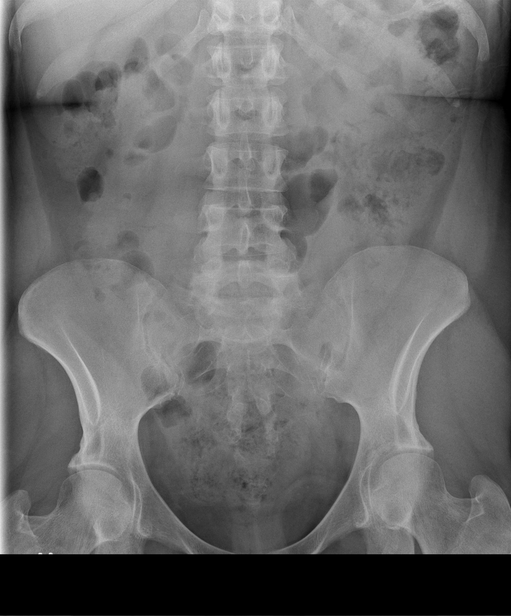

[Series 2: t abdomen supine · 0.15mm/px · 1 of 1 slices shown (2 of 2)]
[im 1/1]
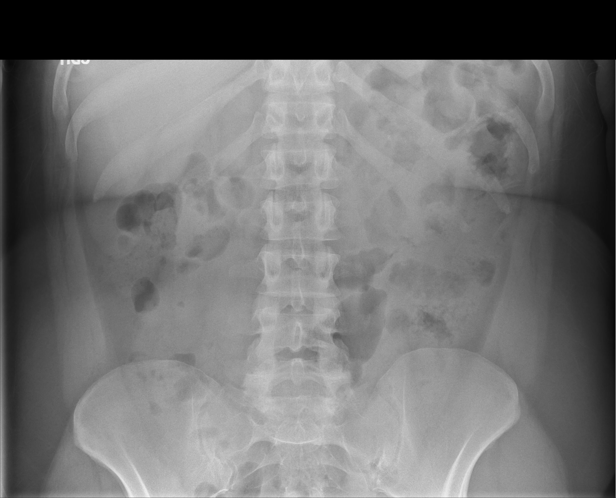

[Series 4: fluoro_barium swallow 2fps_bw · 0.18mm/px · 1 of 1 slices shown (1 of 7)]
[im 1/1]
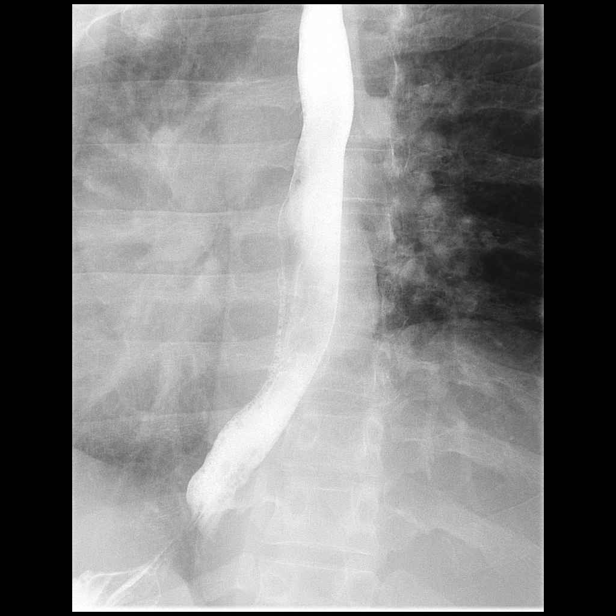

[Series 5: fluoro_barium swallow 2fps_bw · 0.18mm/px · 1 of 1 slices shown (2 of 7)]
[im 1/1]
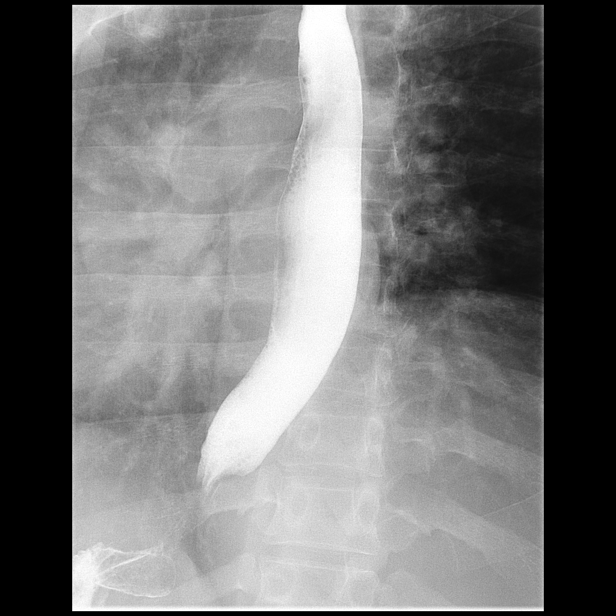

[Series 6: fluoro_barium swallow 2fps_bw · 0.18mm/px · 1 of 1 slices shown (3 of 7)]
[im 1/1]
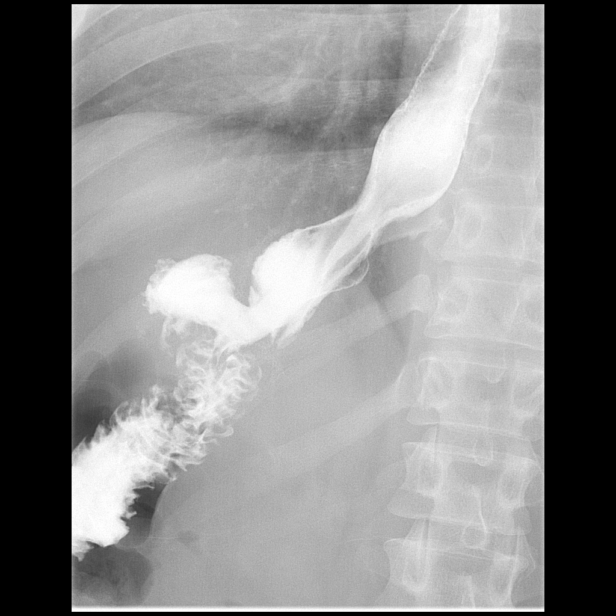

[Series 7: fluoro_barium swallow 2fps_bw · 0.18mm/px · 1 of 1 slices shown (4 of 7)]
[im 1/1]
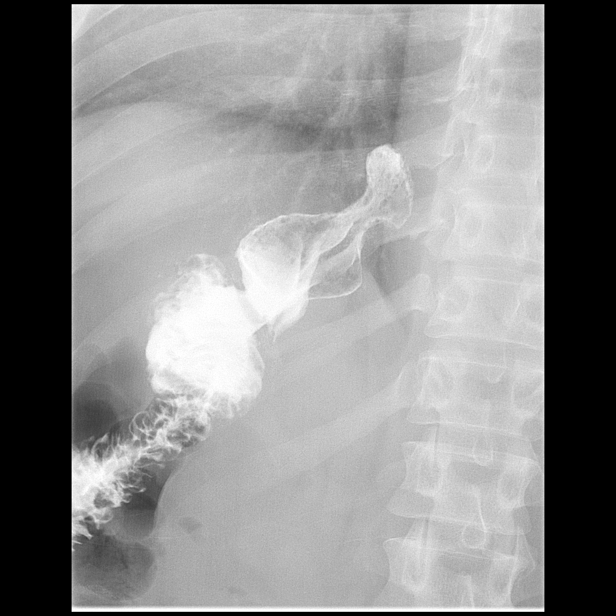

[Series 9: fluoro_barium swallow 2fps_bw · 0.18mm/px · 1 of 1 slices shown (5 of 7)]
[im 1/1]
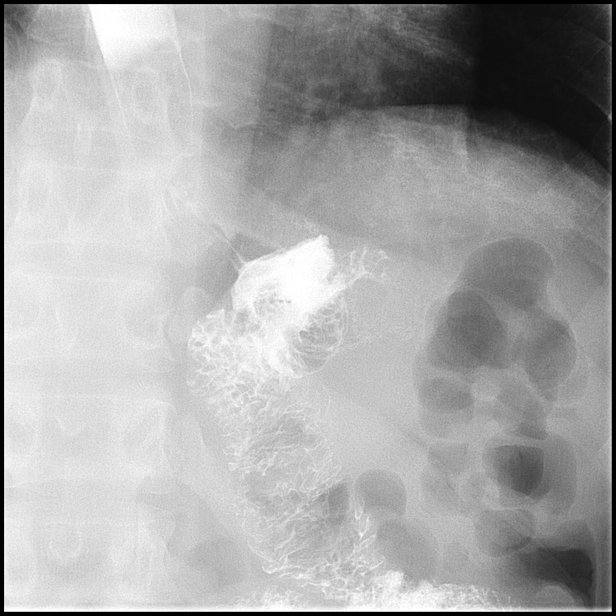

[Series 10: fluoro_barium swallow 2fps_bw · 0.18mm/px · 1 of 1 slices shown (6 of 7)]
[im 1/1]
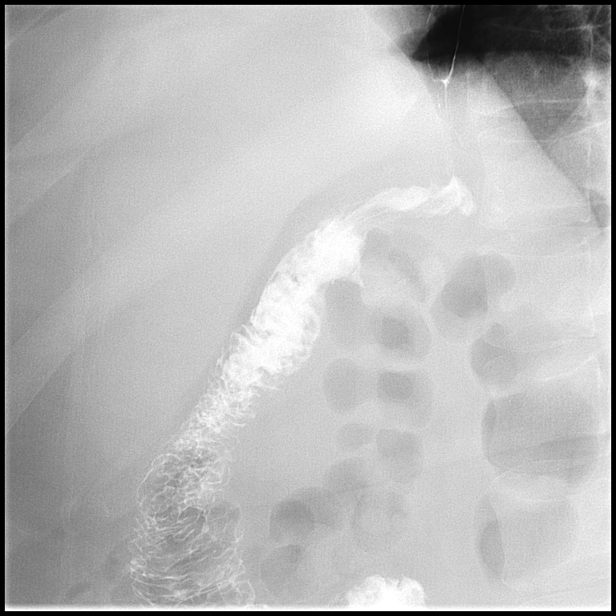

[Series 11: fluoro_barium swallow 2fps_bw · 0.18mm/px · 1 of 1 slices shown (7 of 7)]
[im 1/1]
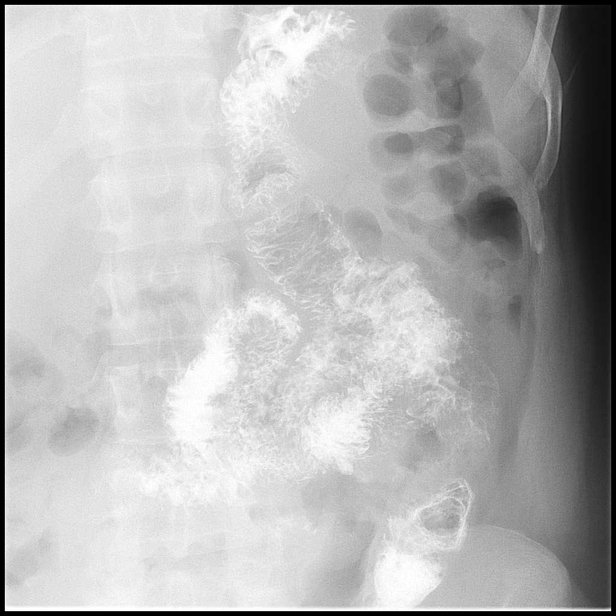

[Series 13: t abdomen barium pa · 0.15mm/px · 1 of 1 slices shown (1 of 4)]
[im 1/1]
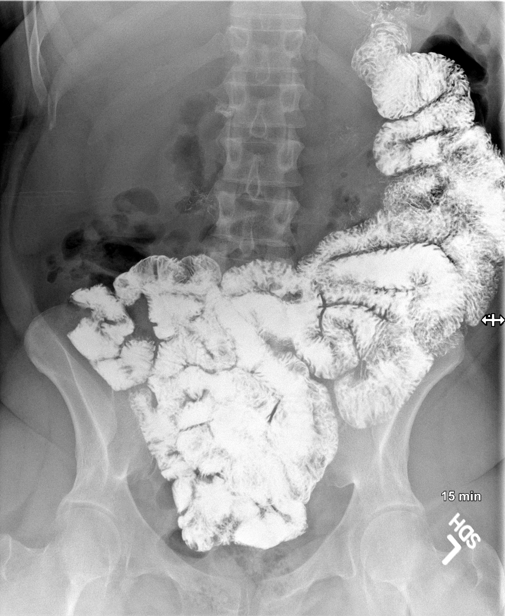

[Series 14: t abdomen barium pa · 0.15mm/px · 1 of 1 slices shown (2 of 4)]
[im 1/1]
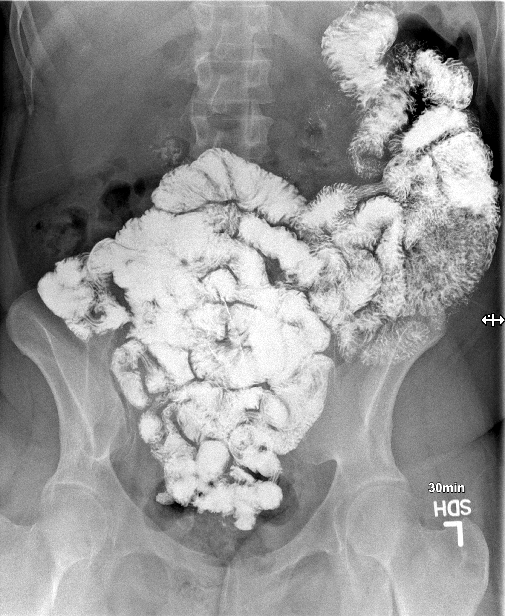

[Series 15: t abdomen barium pa · 0.15mm/px · 1 of 1 slices shown (3 of 4)]
[im 1/1]
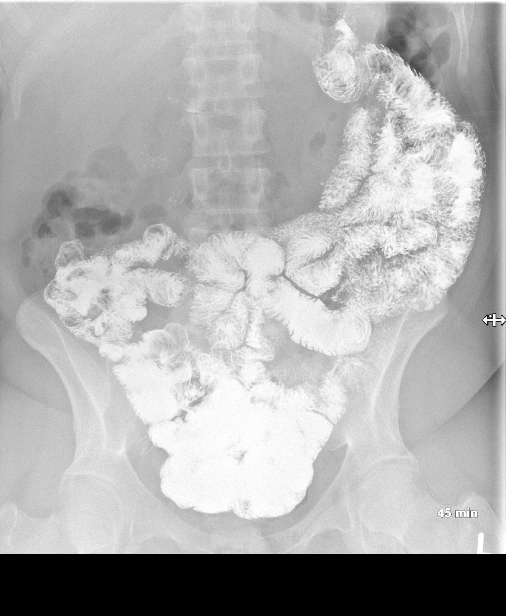

[Series 16: t abdomen barium pa · 0.15mm/px · 1 of 1 slices shown (4 of 4)]
[im 1/1]
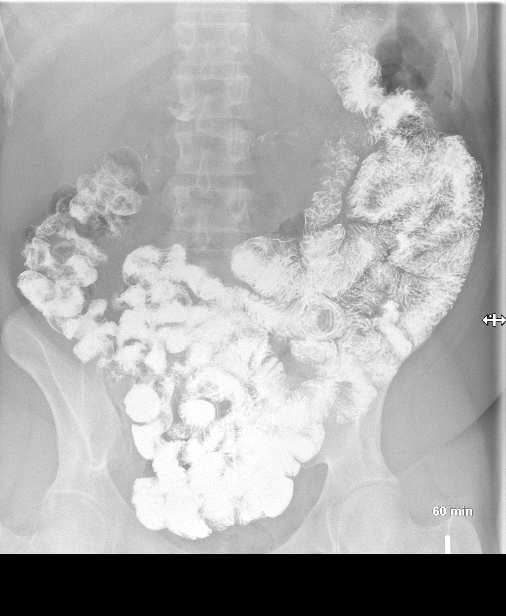

[Series 18: fluoro_barium singleshot_bw · 0.19mm/px · 1 of 1 slices shown (1 of 2)]
[im 1/1]
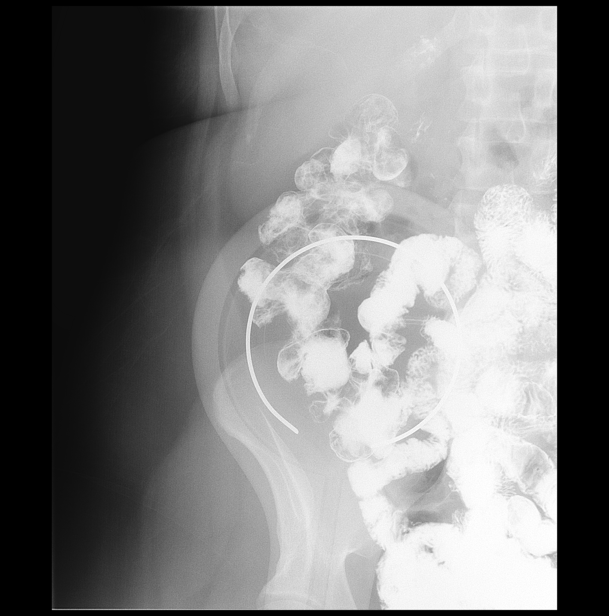

[Series 19: fluoro_barium singleshot_bw · 0.18mm/px · 1 of 1 slices shown (2 of 2)]
[im 1/1]
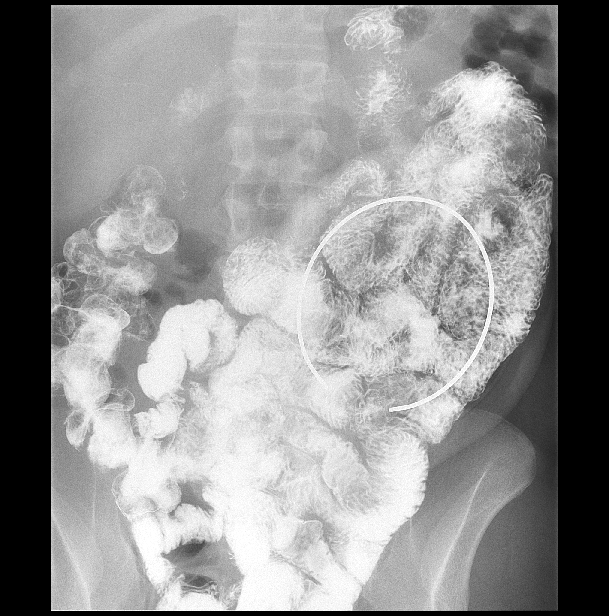

[15 of 19 positions shown; findings below may reference images not displayed]

FINDINGS: Esophagus is widely patent. No obstructing abnormality identified.
Gastric remnant is widely patent as is the anastomosis. No
obstructing lesion. No reflux. Small bowel appears normal. Transit
time normal. Terminal ileal region normal.
IMPRESSION: 1. Gastric bypass. Gastric remnant is widely patent as is the
anastomosis. No reflux.
2. Small bowel appears normal.  No focal abnormality identified.

## 2015-09-22 ENCOUNTER — Ambulatory Visit (INDEPENDENT_AMBULATORY_CARE_PROVIDER_SITE_OTHER): Payer: Managed Care, Other (non HMO) | Admitting: Family Medicine

## 2015-09-22 VITALS — BP 120/88 | HR 89 | Temp 98.4°F | Resp 20 | Ht 66.54 in | Wt 236.4 lb

## 2015-09-22 DIAGNOSIS — J069 Acute upper respiratory infection, unspecified: Secondary | ICD-10-CM | POA: Diagnosis not present

## 2015-09-22 DIAGNOSIS — R0981 Nasal congestion: Secondary | ICD-10-CM | POA: Diagnosis not present

## 2015-09-22 NOTE — Patient Instructions (Signed)
You have a viral infection of your sinuses.  This should be better in the next several days.  In the meantime, use the following medications:  The nasal spray is called Afrin.  Use this for only 3 days.  The generic from the pharmacy is called oxymetazoline.    Use Zyrtec-D.  The -D stands for pseudoephedrine.  Take this in the morning.  You can repeat it twelve hours later.    If you have any shortness of breath, fevers, chest pain, or worsening symptoms, please come back immediately or go to the ER after hours.  It was good to see you today!

## 2015-09-22 NOTE — Progress Notes (Signed)
Ashley Stephens is a 40 y.o. female who presents to Urgent Care today for sinus issues:  1.  Sinus pressure:  Also with sore throat and difficulty breathing through her nose.  Has fairly persistent nasal drainage.  Started about 1 week ago. Sore throat is improving.  No cough.  No fevers or chills.  Eating and drinking well.  No N/V.   ROS as above otherwise neg.    PMH reviewed.  Past Medical History  Diagnosis Date  . Thyroid disorder    Past Surgical History  Procedure Laterality Date  . Breast biopsy  1995  . Tubal ligation  1996  . Abdominal hysterectomy  2013  . Gastric bypass  2012    Medications reviewed. No current outpatient prescriptions on file.   No current facility-administered medications for this visit.     Physical Exam:  BP 120/88 mmHg  Pulse 89  Temp(Src) 98.4 F (36.9 C) (Oral)  Resp 20  Ht 5' 6.53" (1.69 m)  Wt 236 lb 6.4 oz (107.23 kg)  BMI 37.54 kg/m2  SpO2 98% Gen:  Patient sitting on exam table, appears stated age in no acute distress Head: Normocephalic atraumatic Eyes: EOMI, PERRL, sclera and conjunctiva non-erythematous Ears:  Canals clear bilaterally.  TMs pearly gray bilaterally without erythema or bulging.   Nose:  Nasal turbinates grossly inflamed BL.  Some exudates noted. Sinuses nontender BL.  Neck: No cervical lymphadenopathy noted Heart:  RRR, no murmurs auscultated. Pulm:  Clear to auscultation bilaterally with good air movement.  No wheezes or rales noted.      Assessment and Plan:  1. URI with sinus congestion:  Likely viral illness based on symptoms and history.  No signs of bacterial illness. Symptomatic treatment for now, see instructions. Return if worsening or no improvement in 1 week.

## 2015-10-26 ENCOUNTER — Emergency Department (HOSPITAL_BASED_OUTPATIENT_CLINIC_OR_DEPARTMENT_OTHER)
Admission: EM | Admit: 2015-10-26 | Discharge: 2015-10-26 | Disposition: A | Discharge status: Home / Self Care | Payer: Managed Care, Other (non HMO) | Attending: Emergency Medicine | Admitting: Emergency Medicine

## 2015-10-26 ENCOUNTER — Emergency Department (HOSPITAL_BASED_OUTPATIENT_CLINIC_OR_DEPARTMENT_OTHER): Payer: Managed Care, Other (non HMO)

## 2015-10-26 ENCOUNTER — Encounter (HOSPITAL_BASED_OUTPATIENT_CLINIC_OR_DEPARTMENT_OTHER): Payer: Self-pay | Admitting: Emergency Medicine

## 2015-10-26 DIAGNOSIS — R0789 Other chest pain: Secondary | ICD-10-CM | POA: Insufficient documentation

## 2015-10-26 DIAGNOSIS — Z8639 Personal history of other endocrine, nutritional and metabolic disease: Secondary | ICD-10-CM | POA: Diagnosis not present

## 2015-10-26 DIAGNOSIS — R109 Unspecified abdominal pain: Secondary | ICD-10-CM | POA: Diagnosis not present

## 2015-10-26 DIAGNOSIS — F329 Major depressive disorder, single episode, unspecified: Secondary | ICD-10-CM | POA: Insufficient documentation

## 2015-10-26 DIAGNOSIS — F419 Anxiety disorder, unspecified: Secondary | ICD-10-CM | POA: Diagnosis not present

## 2015-10-26 DIAGNOSIS — Z79899 Other long term (current) drug therapy: Secondary | ICD-10-CM | POA: Insufficient documentation

## 2015-10-26 DIAGNOSIS — R079 Chest pain, unspecified: Secondary | ICD-10-CM

## 2015-10-26 HISTORY — DX: Depression, unspecified: F32.A

## 2015-10-26 HISTORY — DX: Anxiety disorder, unspecified: F41.9

## 2015-10-26 HISTORY — DX: Major depressive disorder, single episode, unspecified: F32.9

## 2015-10-26 LAB — BASIC METABOLIC PANEL
Anion gap: 6 (ref 5–15)
BUN: 9 mg/dL (ref 6–20)
CHLORIDE: 106 mmol/L (ref 101–111)
CO2: 25 mmol/L (ref 22–32)
Calcium: 8.6 mg/dL — ABNORMAL LOW (ref 8.9–10.3)
Creatinine, Ser: 0.71 mg/dL (ref 0.44–1.00)
GFR calc Af Amer: >60 mL/min (ref >60)
GLUCOSE: 100 mg/dL — AB (ref 65–99)
POTASSIUM: 3.3 mmol/L — AB (ref 3.5–5.1)
SODIUM: 137 mmol/L (ref 135–145)

## 2015-10-26 LAB — CBC WITH DIFFERENTIAL/PLATELET
BASOS ABS: 0 10*3/uL (ref 0.0–0.1)
Basophils Relative: 1 %
Eosinophils Absolute: 0.1 10*3/uL (ref 0.0–0.7)
Eosinophils Relative: 2 %
HEMATOCRIT: 39.1 % (ref 36.0–46.0)
Hemoglobin: 13.5 g/dL (ref 12.0–15.0)
LYMPHS ABS: 1.7 10*3/uL (ref 0.7–4.0)
LYMPHS PCT: 29 %
MCH: 29.4 pg (ref 26.0–34.0)
MCHC: 34.5 g/dL (ref 30.0–36.0)
MCV: 85.2 fL (ref 78.0–100.0)
Monocytes Absolute: 0.4 10*3/uL (ref 0.1–1.0)
Monocytes Relative: 7 %
Neutro Abs: 3.8 10*3/uL (ref 1.7–7.7)
Neutrophils Relative %: 61 %
PLATELETS: 281 10*3/uL (ref 150–400)
RBC: 4.59 MIL/uL (ref 3.87–5.11)
RDW: 13 % (ref 11.5–15.5)
WBC: 6.1 10*3/uL (ref 4.0–10.5)

## 2015-10-26 LAB — TROPONIN I: Troponin I: <0.03 ng/mL (ref <0.031)

## 2015-10-26 LAB — D-DIMER, QUANTITATIVE: D-Dimer, Quant: <0.27 ug/mL-FEU (ref 0.00–0.50)

## 2015-10-26 IMAGING — CR DG CHEST 2V
2 series · 2 of 2 positions shown · non-contrast
Comparison: [DATE]

CLINICAL DATA: Mid right-sided chest pain starting today.

EXAM:
CHEST  2 VIEW

[w chest pa]
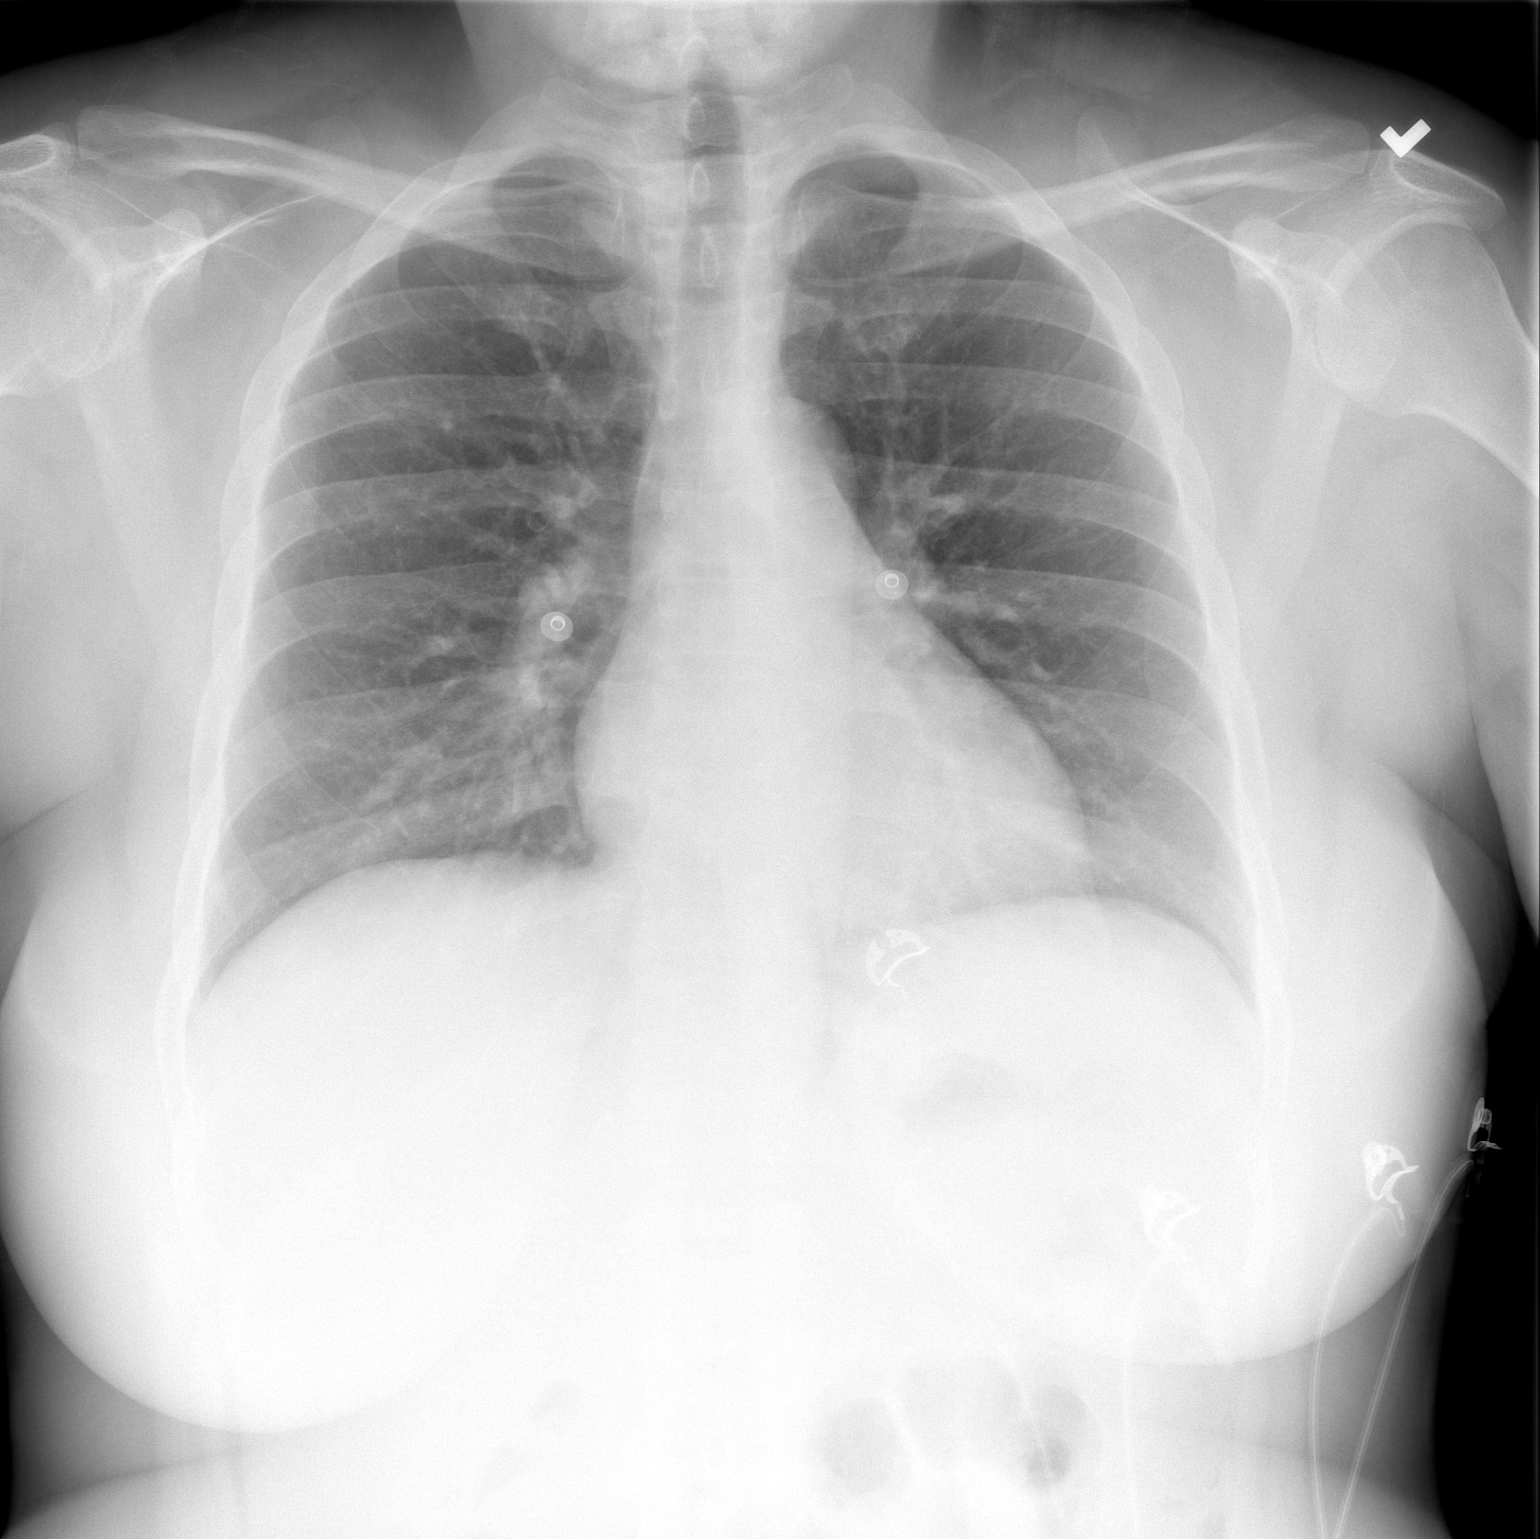

[w chest lat]
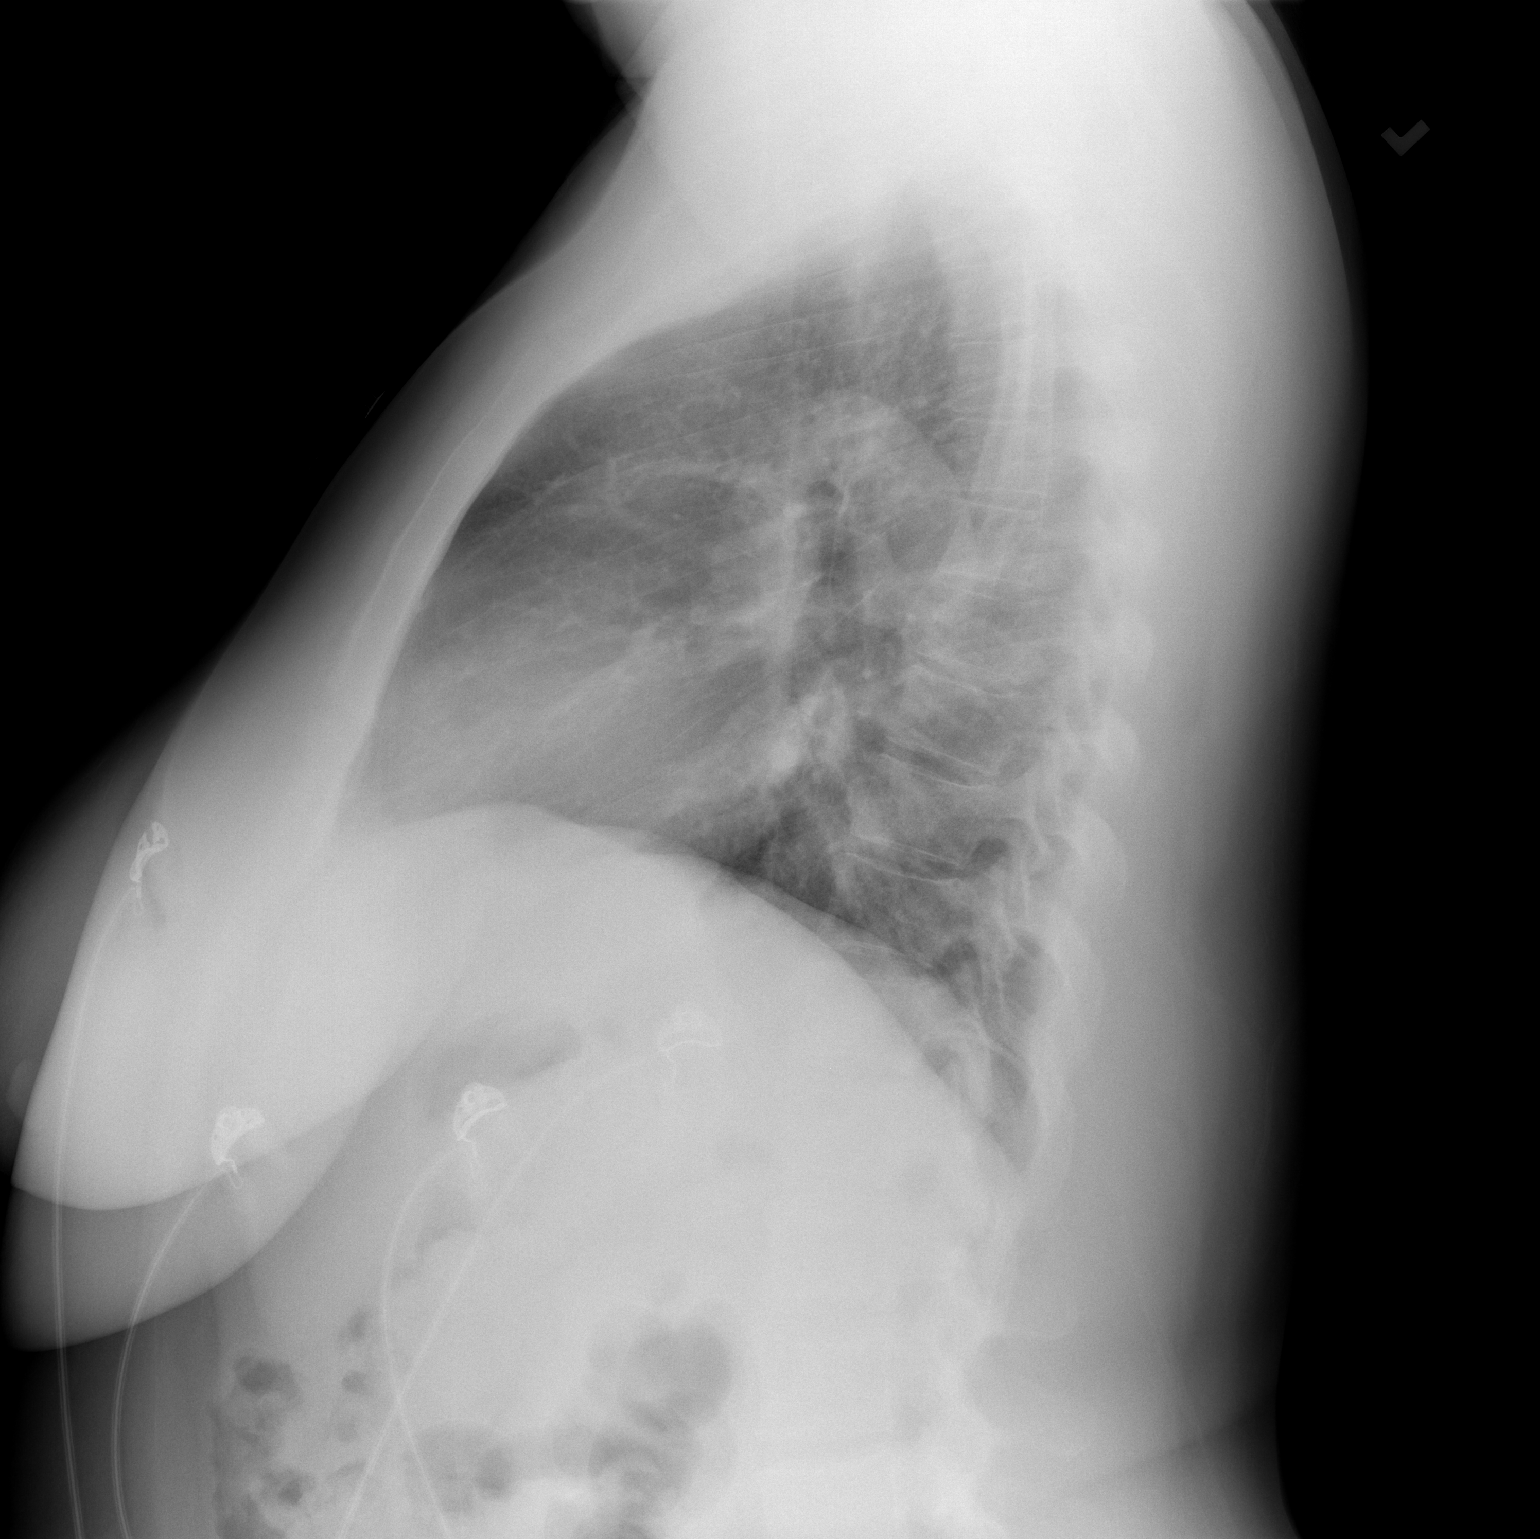

[2 of 2 positions shown; findings below may reference images not displayed]

FINDINGS: The lungs are clear wiithout focal pneumonia, edema, pneumothorax or
pleural effusion. The cardiopericardial silhouette is within normal
limits for size. The visualized bony structures of the thorax are
intact.
IMPRESSION: No active cardiopulmonary disease.

## 2015-10-26 MED ORDER — NAPROXEN 500 MG PO TABS: 500.0000 mg | ORAL_TABLET | Freq: Two times a day (BID) | ORAL | Status: DC

## 2015-10-26 MED ORDER — TRAMADOL HCL 50 MG PO TABS: 50.0000 mg | ORAL_TABLET | Freq: Four times a day (QID) | ORAL | Status: DC | PRN

## 2015-10-26 NOTE — ED Notes (Signed)
Anxiety since this morning. Chest wall pain with tenderness to touch. Anxiety got worse throughout the day at work.  Increase in stress recently.

## 2015-10-26 NOTE — ED Provider Notes (Signed)
CSN: [id number]     Arrival date & time 10/26/15  1750 History  By signing my name below, I, Irene Pap, attest that this documentation has been prepared under the direction and in the presence of Fredia Sorrow, MD. Electronically Signed: Irene Pap, ED Scribe. 10/26/2015. 8:26 PM.   Chief Complaint  Patient presents with  . Anxiety and Chest Wall Pain    The history is provided by the patient. No language interpreter was used.  HPI Comments: Ashley Stephens is a 40 y.o. Female with a hx of anxiety and depression who presents to the Emergency Department complaining of gradually worsening anxiety onset 12 hours ago. Pt reports associated pressured, constant, non-radiating substernal chest wall pain that worsens with palpation and deep breathing. She currently rates her pain 7/10. She states that she is having left sided flank pain that began this morning. She states that her anxiety worsened throughout the day and she has been experiencing an increase in stress recently. She has taken [facility] goody powders for the pain to no relief. She denies symptoms yesterday before going to bed. She denies hx of similar symptoms. She denies use of blood thinners or hx of bruising and bleeding easily. She denies fever, chills, diaphoresis, sore throat, cough, visual disturbance, leg swelling, nausea, vomiting, diarrhea, dysuria, back and neck pain, rash or headaches.   Past Medical History  Diagnosis Date  . Thyroid disorder   . Anxiety   . Depression    Past Surgical History  Procedure Laterality Date  . Breast biopsy  1995  . Tubal ligation  1996  . Abdominal hysterectomy  2013  . Gastric bypass  2012   Family History  Problem Relation Age of Onset  . Breast cancer Maternal Grandmother    Social History  Substance Use Topics  . Smoking status: Never Smoker   . Smokeless tobacco: Never Used  . Alcohol Use: No   OB History    Gravida Para Term Preterm AB TAB SAB Ectopic Multiple  Living   2 2        2       Obstetric Comments   1st Menstrual Cycle:  10 1st Pregnancy:  36     Review of Systems  Constitutional: Negative for fever, chills and diaphoresis.  HENT: Negative for rhinorrhea and sore throat.   Eyes: Negative for visual disturbance.  Respiratory: Positive for shortness of breath. Negative for cough.   Cardiovascular: Positive for chest pain (chest wall pain). Negative for leg swelling.  Gastrointestinal: Negative for nausea, vomiting, abdominal pain and diarrhea.  Genitourinary: Positive for flank pain. Negative for dysuria.  Musculoskeletal: Negative for back pain and neck pain.  Skin: Negative for rash.  Neurological: Negative for headaches.  Hematological: Does not bruise/bleed easily.  Psychiatric/Behavioral: Negative for confusion. The patient is nervous/anxious.       Allergies  Review of patient's allergies indicates no known allergies.  Home Medications   Prior to Admission medications   Medication Sig Start Date End Date Taking? Authorizing Provider  desvenlafaxine (PRISTIQ) 100 MG 24 hr tablet Take 100 mg by mouth daily.   Yes Historical Provider, MD  naproxen (NAPROSYN) 500 MG tablet Take 1 tablet (500 mg total) by mouth 2 (two) times daily. 10/26/15   Fredia Sorrow, MD  traMADol (ULTRAM) 50 MG tablet Take 1 tablet (50 mg total) by mouth every 6 (six) hours as needed. 10/26/15   Fredia Sorrow, MD   BP 104/55 mmHg  Pulse 67  Temp(Src) 97.8  F (36.6 C) (Oral)  Resp 18  Ht 5' 6.5" (1.689 m)  Wt 105.688 kg  BMI 37.05 kg/m2  SpO2 100% Physical Exam  Constitutional: She is oriented to person, place, and time. She appears well-developed and well-nourished.  HENT:  Head: Normocephalic and atraumatic.  Mouth/Throat: Oropharynx is clear and moist and mucous membranes are normal.  Eyes: Conjunctivae and EOM are normal. Pupils are equal, round, and reactive to light. No scleral icterus.  Neck: Normal range of motion. Neck supple.   Cardiovascular: Normal rate, regular rhythm and normal heart sounds.  Exam reveals no gallop and no friction rub.   No murmur heard. Pulmonary/Chest: Effort normal and breath sounds normal. She has no wheezes.  Abdominal: Soft. Bowel sounds are normal. There is no tenderness.  Musculoskeletal: Normal range of motion.  No swelling to bilateral legs or ankles  Neurological: She is alert and oriented to person, place, and time. No cranial nerve deficit. She exhibits normal muscle tone. Coordination normal.  Skin: Skin is warm and dry.  Psychiatric: She has a normal mood and affect. Her behavior is normal.  Nursing note and vitals reviewed.   ED Course  Procedures (including critical care time) DIAGNOSTIC STUDIES: Oxygen Saturation is 100% on RA, normal by my interpretation.    COORDINATION OF CARE: 6:53 PM-Discussed treatment plan which includes x-ray and labs with pt at bedside and pt agreed to plan.  Labs Review Labs Reviewed  BASIC METABOLIC PANEL - Abnormal; Notable for the following:    Potassium 3.3 (*)    Glucose, Bld 100 (*)    Calcium 8.6 (*)    All other components within normal limits  CBC WITH DIFFERENTIAL/PLATELET  TROPONIN I  D-DIMER, QUANTITATIVE (NOT AT [facility])   Results for orders placed or performed during the hospital encounter of [id number]  Basic metabolic panel  Result Value Ref Range   Sodium 137 135 - 145 mmol/L   Potassium 3.3 (L) 3.5 - 5.1 mmol/L   Chloride 106 101 - 111 mmol/L   CO2 25 22 - 32 mmol/L   Glucose, Bld 100 (H) 65 - 99 mg/dL   BUN 9 6 - 20 mg/dL   Creatinine, Ser 0.71 0.44 - 1.00 mg/dL   Calcium 8.6 (L) 8.9 - 10.3 mg/dL   GFR calc non Af Amer >60 >60 mL/min   GFR calc Af Amer >60 >60 mL/min   Anion gap 6 5 - 15  CBC with Differential/Platelet  Result Value Ref Range   WBC 6.1 4.0 - 10.5 K/uL   RBC 4.59 3.87 - 5.11 MIL/uL   Hemoglobin 13.5 12.0 - 15.0 g/dL   HCT 39.1 36.0 - 46.0 %   MCV 85.2 78.0 - 100.0 fL   MCH 29.4 26.0 - 34.0 pg    MCHC 34.5 30.0 - 36.0 g/dL   RDW 13.0 11.5 - 15.5 %   Platelets 281 150 - 400 K/uL   Neutrophils Relative % 61 %   Neutro Abs 3.8 1.7 - 7.7 K/uL   Lymphocytes Relative 29 %   Lymphs Abs 1.7 0.7 - 4.0 K/uL   Monocytes Relative 7 %   Monocytes Absolute 0.4 0.1 - 1.0 K/uL   Eosinophils Relative 2 %   Eosinophils Absolute 0.1 0.0 - 0.7 K/uL   Basophils Relative 1 %   Basophils Absolute 0.0 0.0 - 0.1 K/uL  Troponin I  Result Value Ref Range   Troponin I <0.03 <0.031 ng/mL  D-dimer, quantitative (not at [facility])  Result Value Ref  Range   D-Dimer, Quant <0.27 0.00 - 0.50 ug/mL-FEU     Imaging Review Dg Chest 2 View  10/26/2015  CLINICAL DATA:  Mid right-sided chest pain starting today. EXAM: CHEST  2 VIEW COMPARISON:  07/18/2011 FINDINGS: The lungs are clear wiithout focal pneumonia, edema, pneumothorax or pleural effusion. The cardiopericardial silhouette is within normal limits for size. The visualized bony structures of the thorax are intact. IMPRESSION: No active cardiopulmonary disease. Electronically Signed   By: Misty Stanley M.D.   On: 10/26/2015 18:37   I have personally reviewed and evaluated these images and lab results as part of my medical decision-making.   EKG Interpretation   Date/Time:  Wednesday October 26 2015 18:04:17 EST Ventricular Rate:  63 PR Interval:  148 QRS Duration: 93 QT Interval:  411 QTC Calculation: 421 R Axis:   61 Text Interpretation:  Sinus rhythm Confirmed by Jaima Janney  MD, Alishah Schulte  ([id number]) on 10/26/2015 6:24:03 PM      MDM   Final diagnoses:  Chest pain, unspecified chest pain type    Patient with onset of chest discomfort at 6 this morning. It's been constant 7-8 out of 10. Worse with taking a deep breath. Patient has some cardiac risk factors in the family brother with history of 2 heart attacks before age 35.  Workup here today without any acute cardiac findings chest x-rays negative. EKG without acute changes. Troponin was negative  would expect it to be abnormal after all these hours of constant pain. On addition d-dimer was negative so no concerns for pulmonary embolus.  We'll start patient on a baby aspirin a day will treat the chest pain with tramadol and anti-inflammatory. Patient will follow-up with her primary care doctor they can decide if they want to do additional cardiac workup.    I personally performed the services described in this documentation, which was scribed in my presence. The recorded information has been reviewed and is accurate.      Fredia Sorrow, MD 10/26/15 2028

## 2015-10-26 NOTE — Discharge Instructions (Signed)
Aspirin and Your Heart  Aspirin is a medicine that affects the way blood clots. Aspirin can be used to help reduce the risk of blood clots, heart attacks, and other heart-related problems.  SHOULD I TAKE ASPIRIN? Your health care provider will help you determine whether it is safe and beneficial for you to take aspirin daily. Taking aspirin daily may be beneficial if you:  Have had a heart attack or chest pain.  Have undergone open heart surgery such as coronary artery bypass surgery (CABG).  Have had coronary angioplasty.  Have experienced a stroke or transient ischemic attack (TIA).  Have peripheral vascular disease (PVD).  Have chronic heart rhythm problems such as atrial fibrillation. ARE THERE ANY RISKS OF TAKING ASPIRIN DAILY? Daily use of aspirin can increase your risk of side effects. Some of these include:  Bleeding. Bleeding problems can be minor or serious. An example of a minor problem is a cut that does not stop bleeding. An example of a more serious problem is stomach bleeding or bleeding into the brain. Your risk of bleeding is increased if you are also taking non-steroidal anti-inflammatory medicine (NSAIDs).  Increased bruising.  Upset stomach.  An allergic reaction. People who have nasal polyps have an increased risk of developing an aspirin allergy. WHAT ARE SOME GUIDELINES I SHOULD FOLLOW WHEN TAKING ASPIRIN?   Take aspirin only as directed by your health care provider. Make sure you understand how much you should take and what form you should take. The two forms of aspirin are:  Non-enteric-coated. This type of aspirin does not have a coating and is absorbed quickly. Non-enteric-coated aspirin is usually recommended for people with chest pain. This type of aspirin also comes in a chewable form.  Enteric-coated. This type of aspirin has a special coating that releases the medicine very slowly. Enteric-coated aspirin causes less stomach upset than non-enteric-coated  aspirin. This type of aspirin should not be chewed or crushed.  Drink alcohol in moderation. Drinking alcohol increases your risk of bleeding. WHEN SHOULD I SEEK MEDICAL CARE?   You have unusual bleeding or bruising.  You have stomach pain.  You have an allergic reaction. Symptoms of an allergic reaction include:  Hives.  Itchy skin.  Swelling of the lips, tongue, or face.  You have ringing in your ears. WHEN SHOULD I SEEK IMMEDIATE MEDICAL CARE?   Your bowel movements are bloody, dark red, or black in color.  You vomit or cough up blood.  You have blood in your urine.  You cough, wheeze, or feel short of breath. If you have any of the following symptoms, this is an emergency. Do not wait to see if the pain will go away. Get medical help at once. Call your local emergency services (911 in the [place] Do not drive yourself to the hospital.  You have severe chest pain, especially if the pain is crushing or pressure-like and spreads to the arms, back, neck, or jaw.  You have stroke-like symptoms, such as:   Loss of vision.   Difficulty talking.   Numbness or weakness on one side of your body.   Numbness or weakness in your arm or leg.   Not thinking clearly or feeling confused.    This information is not intended to replace advice given to you by your health care provider. Make sure you discuss any questions you have with your health care provider.   Recommend starting a baby aspirin a day. Can also take Naprosyn here in the short  run and the tramadol as needed for pain relief. Make an appointment to follow-up with your regular doctor. They may decide to do additional chest pain workup. No evidence of any acute findings on workup here today. Return for any new or worse symptoms.   Document Released: 08/02/2008 Document Revised: 09/10/2014 Document Reviewed: 11/25/2013 Elsevier Interactive Patient Education [employer].

## 2016-05-07 ENCOUNTER — Encounter (HOSPITAL_BASED_OUTPATIENT_CLINIC_OR_DEPARTMENT_OTHER): Payer: Self-pay

## 2016-05-07 ENCOUNTER — Emergency Department (HOSPITAL_BASED_OUTPATIENT_CLINIC_OR_DEPARTMENT_OTHER)
Admission: EM | Admit: 2016-05-07 | Discharge: 2016-05-07 | Disposition: A | Discharge status: Home / Self Care | Payer: Managed Care, Other (non HMO) | Attending: Emergency Medicine | Admitting: Emergency Medicine

## 2016-05-07 DIAGNOSIS — M5441 Lumbago with sciatica, right side: Secondary | ICD-10-CM | POA: Insufficient documentation

## 2016-05-07 DIAGNOSIS — M545 Low back pain: Secondary | ICD-10-CM | POA: Diagnosis present

## 2016-05-07 MED ORDER — LIDOCAINE 5 % EX PTCH: 1.0000 | MEDICATED_PATCH | CUTANEOUS | 0 refills | Status: DC

## 2016-05-07 MED ORDER — NAPROXEN 500 MG PO TABS: 500.0000 mg | ORAL_TABLET | Freq: Two times a day (BID) | ORAL | 0 refills | Status: DC

## 2016-05-07 MED ORDER — PREDNISONE 10 MG PO TABS
60.0000 mg | ORAL_TABLET | Freq: Once | ORAL | Status: AC
  Administered 2016-05-07: 60 mg via ORAL
  Filled 2016-05-07: qty 1

## 2016-05-07 MED ORDER — PREDNISONE 10 MG PO TABS: 20.0000 mg | ORAL_TABLET | Freq: Two times a day (BID) | ORAL | 0 refills | Status: DC

## 2016-05-07 MED ORDER — KETOROLAC TROMETHAMINE 60 MG/2ML IM SOLN
60.0000 mg | Freq: Once | INTRAMUSCULAR | Status: AC
  Administered 2016-05-07: 60 mg via INTRAMUSCULAR
  Filled 2016-05-07: qty 2

## 2016-05-07 MED ORDER — METHOCARBAMOL 500 MG PO TABS: 500.0000 mg | ORAL_TABLET | Freq: Two times a day (BID) | ORAL | 0 refills | Status: DC

## 2016-05-07 MED ORDER — OXYCODONE-ACETAMINOPHEN 5-325 MG PO TABS: 1.0000 | ORAL_TABLET | Freq: Four times a day (QID) | ORAL | 0 refills | Status: DC | PRN

## 2016-05-07 NOTE — ED Notes (Signed)
Patient A&Ox4 and in NAD. Patient ambulating at d/c with a steady limp.

## 2016-05-07 NOTE — ED Provider Notes (Signed)
[facility] DEPT Provider Note   CSN: [id number] Arrival date & time: 05/07/16  1114     History   Chief Complaint Chief Complaint  Patient presents with  . Sciatica    HPI Ashley Stephens is a 40 y.o. female.  HPI   Ashley Stephens is a 40 y.o. female, with a history of anxiety and depression, presenting to the ED with Acute on chronic right-sided lower back pain, worsening 2 weeks ago. Pain is aching, moderate to severe, radiating down the right leg. Patient has tried ibuprofen and Tylenol without significant relief. Denies fever/chills, trauma, nausea/vomiting, bowel or bladder changes, or any other complaints.     Past Medical History:  Diagnosis Date  . Anxiety   . Depression   . Thyroid disorder     There are no active problems to display for this patient.   Past Surgical History:  Procedure Laterality Date  . ABDOMINAL HYSTERECTOMY  2013  . BREAST BIOPSY  1995  . GASTRIC BYPASS  2012  . TUBAL LIGATION  1996    OB History    Gravida Para Term Preterm AB Living   2 2       2    SAB TAB Ectopic Multiple Live Births                  Obstetric Comments   1st Menstrual Cycle:  10 1st Pregnancy:  36       Home Medications    Prior to Admission medications   Medication Sig Start Date End Date Taking? Authorizing Provider  lidocaine (LIDODERM) 5 % Place 1 patch onto the skin daily. Remove & Discard patch within 12 hours or as directed by MD 05/07/16   Lorayne Bender, PA-C  methocarbamol (ROBAXIN) 500 MG tablet Take 1 tablet (500 mg total) by mouth 2 (two) times daily. 05/07/16   Shawn C Joy, PA-C  naproxen (NAPROSYN) 500 MG tablet Take 1 tablet (500 mg total) by mouth 2 (two) times daily. 05/07/16   Shawn C Joy, PA-C  oxyCODONE-acetaminophen (PERCOCET/ROXICET) 5-325 MG tablet Take 1-2 tablets by mouth every 6 (six) hours as needed for severe pain. 05/07/16   Shawn C Joy, PA-C  predniSONE (DELTASONE) 10 MG tablet Take 2 tablets (20 mg total) by mouth  2 (two) times daily with a meal. 05/07/16   Shawn C Joy, PA-C    Family History Family History  Problem Relation Age of Onset  . Breast cancer Maternal Grandmother     Social History Social History  Substance Use Topics  . Smoking status: Never Smoker  . Smokeless tobacco: Never Used  . Alcohol use No     Allergies   Review of patient's allergies indicates no known allergies.   Review of Systems Review of Systems  Constitutional: Negative for chills and fever.  Gastrointestinal: Negative for nausea and vomiting.  Musculoskeletal: Positive for back pain.  Neurological: Negative for weakness, numbness and headaches.  All other systems reviewed and are negative.    Physical Exam Updated Vital Signs BP 134/84 (BP Location: Right Arm)   Pulse 85   Temp 98 F (36.7 C) (Oral)   Resp 18   Ht 5\' 6"  (1.676 m)   Wt 106.6 kg   SpO2 100%   BMI 37.93 kg/m   Physical Exam  Constitutional: She appears well-developed and well-nourished. No distress.  HENT:  Head: Normocephalic and atraumatic.  Eyes: Conjunctivae are normal.  Neck: Neck supple.  Cardiovascular: Normal rate, regular rhythm  and intact distal pulses.   Pulmonary/Chest: Effort normal. No respiratory distress.  Abdominal: Soft. There is no tenderness. There is no guarding.  Musculoskeletal: Normal range of motion. She exhibits tenderness. She exhibits no edema.  Tenderness to the right sacral and lumbar musculature. Full ROM in all extremities and spine. No midline spinal tenderness.   Lymphadenopathy:    She has no cervical adenopathy.  Neurological: She is alert.  No sensory deficits. Strength 5/5 in all extremities. No gait disturbance. Coordination intact.   Skin: Skin is warm and dry. She is not diaphoretic.  Psychiatric: She has a normal mood and affect. Her behavior is normal.  Nursing note and vitals reviewed.    ED Treatments / Results  Labs (all labs ordered are listed, but only abnormal results  are displayed) Labs Reviewed - No data to display  EKG  EKG Interpretation None       Radiology No results found.  Procedures Procedures (including critical care time)  Medications Ordered in ED Medications  ketorolac (TORADOL) injection 60 mg (60 mg Intramuscular Given 05/07/16 1144)  predniSONE (DELTASONE) tablet 60 mg (60 mg Oral Given 05/07/16 1143)     Initial Impression / Assessment and Plan / ED Course  I have reviewed the triage vital signs and the nursing notes.  Pertinent labs & imaging results that were available during my care of the patient were reviewed by me and considered in my medical decision making (see chart for details).  Clinical Course   Acute on chronic lower back pain with sciatica symptoms. No neuro or functional deficits. The patient was given instructions for home care as well as return precautions. Patient voices understanding of these instructions, accepts the plan, and is comfortable with discharge.  Vitals:   05/07/16 1122 05/07/16 1211  BP: 134/84 141/72  Pulse: 85 78  Resp: 18 18  Temp: 98 F (36.7 C) 98.5 F (36.9 C)  TempSrc: Oral Oral  SpO2: 100% 98%  Weight: 106.6 kg   Height: 5\' 6"  (1.676 m)      Final Clinical Impressions(s) / ED Diagnoses   Final diagnoses:  Right-sided low back pain with right-sided sciatica    New Prescriptions Discharge Medication List as of 05/07/2016 11:36 AM    START taking these medications   Details  lidocaine (LIDODERM) 5 % Place 1 patch onto the skin daily. Remove & Discard patch within 12 hours or as directed by MD, Starting Mon 05/07/2016, Print    methocarbamol (ROBAXIN) 500 MG tablet Take 1 tablet (500 mg total) by mouth 2 (two) times daily., Starting Mon 05/07/2016, Print    naproxen (NAPROSYN) 500 MG tablet Take 1 tablet (500 mg total) by mouth 2 (two) times daily., Starting Mon 05/07/2016, Print    oxyCODONE-acetaminophen (PERCOCET/ROXICET) 5-325 MG tablet Take 1-2 tablets by mouth every 6  (six) hours as needed for severe pain., Starting Mon 05/07/2016, Print    predniSONE (DELTASONE) 10 MG tablet Take 2 tablets (20 mg total) by mouth 2 (two) times daily with a meal., Starting Mon 05/07/2016, Print         Lorayne Bender, PA-C 05/08/16 1045    Blanchie Dessert, MD 05/08/16 1542

## 2016-05-07 NOTE — ED Triage Notes (Signed)
C/o "sciatic nerve" to right leg x 2 weeks-denies injury-NAD-steady gait

## 2016-05-07 NOTE — Discharge Instructions (Signed)
Take it easy, but do not lay around too much as this may make the stiffness worse. Take 500 mg of naproxen every 12 hours or 800 mg of ibuprofen every 8 hours for the next 3 days. Take these medications with food to avoid upset stomach. Robaxin is a muscle relaxer and may help loosen stiff muscles. Percocet for severe pain. Do not take the Robaxin or Percocet while driving or performing other dangerous activities.  Follow-up with orthopedics should symptoms fail to resolve.

## 2016-09-04 DIAGNOSIS — R69 Illness, unspecified: Secondary | ICD-10-CM | POA: Diagnosis not present

## 2016-09-04 DIAGNOSIS — E039 Hypothyroidism, unspecified: Secondary | ICD-10-CM | POA: Diagnosis not present

## 2016-09-04 DIAGNOSIS — E669 Obesity, unspecified: Secondary | ICD-10-CM | POA: Diagnosis not present

## 2016-09-22 DIAGNOSIS — R1084 Generalized abdominal pain: Secondary | ICD-10-CM | POA: Diagnosis not present

## 2016-11-06 DIAGNOSIS — R69 Illness, unspecified: Secondary | ICD-10-CM | POA: Diagnosis not present

## 2016-11-06 DIAGNOSIS — E669 Obesity, unspecified: Secondary | ICD-10-CM | POA: Diagnosis not present

## 2016-11-06 DIAGNOSIS — E039 Hypothyroidism, unspecified: Secondary | ICD-10-CM | POA: Diagnosis not present

## 2017-01-24 DIAGNOSIS — E039 Hypothyroidism, unspecified: Secondary | ICD-10-CM | POA: Diagnosis not present

## 2017-01-24 DIAGNOSIS — R69 Illness, unspecified: Secondary | ICD-10-CM | POA: Diagnosis not present

## 2017-01-24 DIAGNOSIS — K219 Gastro-esophageal reflux disease without esophagitis: Secondary | ICD-10-CM | POA: Diagnosis not present

## 2017-01-31 DIAGNOSIS — F411 Generalized anxiety disorder: Secondary | ICD-10-CM | POA: Diagnosis not present

## 2017-01-31 DIAGNOSIS — R69 Illness, unspecified: Secondary | ICD-10-CM | POA: Diagnosis not present

## 2017-03-12 DIAGNOSIS — L700 Acne vulgaris: Secondary | ICD-10-CM | POA: Diagnosis not present

## 2017-03-12 DIAGNOSIS — L83 Acanthosis nigricans: Secondary | ICD-10-CM | POA: Diagnosis not present

## 2017-03-12 DIAGNOSIS — D2362 Other benign neoplasm of skin of left upper limb, including shoulder: Secondary | ICD-10-CM | POA: Diagnosis not present

## 2017-03-12 DIAGNOSIS — M6740 Ganglion, unspecified site: Secondary | ICD-10-CM | POA: Diagnosis not present

## 2017-03-12 DIAGNOSIS — D225 Melanocytic nevi of trunk: Secondary | ICD-10-CM | POA: Diagnosis not present

## 2017-03-12 DIAGNOSIS — R69 Illness, unspecified: Secondary | ICD-10-CM | POA: Diagnosis not present

## 2017-03-12 DIAGNOSIS — D485 Neoplasm of uncertain behavior of skin: Secondary | ICD-10-CM | POA: Diagnosis not present

## 2017-03-27 DIAGNOSIS — Z124 Encounter for screening for malignant neoplasm of cervix: Secondary | ICD-10-CM | POA: Diagnosis not present

## 2017-03-27 DIAGNOSIS — E039 Hypothyroidism, unspecified: Secondary | ICD-10-CM | POA: Diagnosis not present

## 2017-03-27 DIAGNOSIS — E669 Obesity, unspecified: Secondary | ICD-10-CM | POA: Diagnosis not present

## 2017-03-27 DIAGNOSIS — Z0001 Encounter for general adult medical examination with abnormal findings: Secondary | ICD-10-CM | POA: Diagnosis not present

## 2017-03-27 DIAGNOSIS — R69 Illness, unspecified: Secondary | ICD-10-CM | POA: Diagnosis not present

## 2017-03-27 DIAGNOSIS — I10 Essential (primary) hypertension: Secondary | ICD-10-CM | POA: Diagnosis not present

## 2017-04-10 DIAGNOSIS — R69 Illness, unspecified: Secondary | ICD-10-CM | POA: Diagnosis not present

## 2017-04-26 DIAGNOSIS — Z1231 Encounter for screening mammogram for malignant neoplasm of breast: Secondary | ICD-10-CM | POA: Diagnosis not present

## 2017-04-26 DIAGNOSIS — Z9289 Personal history of other medical treatment: Secondary | ICD-10-CM | POA: Diagnosis not present

## 2017-05-07 DIAGNOSIS — R69 Illness, unspecified: Secondary | ICD-10-CM | POA: Diagnosis not present

## 2017-05-13 DIAGNOSIS — R928 Other abnormal and inconclusive findings on diagnostic imaging of breast: Secondary | ICD-10-CM | POA: Diagnosis not present

## 2017-05-29 ENCOUNTER — Encounter (HOSPITAL_BASED_OUTPATIENT_CLINIC_OR_DEPARTMENT_OTHER): Payer: Self-pay | Admitting: Emergency Medicine

## 2017-05-29 ENCOUNTER — Emergency Department (HOSPITAL_BASED_OUTPATIENT_CLINIC_OR_DEPARTMENT_OTHER)
Admission: EM | Admit: 2017-05-29 | Discharge: 2017-05-29 | Disposition: A | Discharge status: Home / Self Care | Payer: 59 | Attending: Emergency Medicine | Admitting: Emergency Medicine

## 2017-05-29 DIAGNOSIS — R51 Headache: Secondary | ICD-10-CM | POA: Diagnosis not present

## 2017-05-29 DIAGNOSIS — I10 Essential (primary) hypertension: Secondary | ICD-10-CM | POA: Insufficient documentation

## 2017-05-29 DIAGNOSIS — Z79899 Other long term (current) drug therapy: Secondary | ICD-10-CM | POA: Diagnosis not present

## 2017-05-29 DIAGNOSIS — R519 Headache, unspecified: Secondary | ICD-10-CM

## 2017-05-29 HISTORY — DX: Essential (primary) hypertension: I10

## 2017-05-29 NOTE — ED Triage Notes (Addendum)
Patient reports dizziness, headaches x 2-3 weeks.  States BP checked by nurse at [facility] it was 143/91.  Denies chest pain, shortness of breath.  States referred to ER by nurse for hypertension.  Alert and oriented, ambulatory at triage in NAD.

## 2017-05-29 NOTE — ED Notes (Signed)
ED Provider at bedside. 

## 2017-05-29 NOTE — ED Provider Notes (Signed)
[place] DEPT [facility] Provider Note   CSN: [id number] Arrival date & time: 05/29/17  1816     History   Chief Complaint Chief Complaint  Patient presents with  . Hypertension  . Headache    HPI Ashley Stephens is a 41 y.o. female.  HPI  41 year old female presents for evaluation of hypertension. She states she's been having a frontal headache on and off for about 3 weeks. States it occurs about 3 times a week and sometimes multiple times per day. It does not occur at a certain time per day. This morning it occurred first thing in the morning but typically does not. There is no vomiting, blurry vision, dizziness, weakness/numbness or neck pain/stiffness. She states sometimes she has blurry vision when staring at a computer screen which she does all day at work. However typically no blurry vision. Once in a while she gets dizzy but doesn't associate with the headache. 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 Checked her blood pressure and was about 140/90. She was told this was critical hypertension and she needs to get checked out. The patient became more worried about it so she came to this ER for evaluation. No chest pain or shortness of breath. The headache is currently mild. Has a PCP and states last time she got checked was about 3 months ago and was told her blood pressure was around 130/80, is not currently on antihypertensives.  Past Medical History:  Diagnosis Date  . Anxiety   . Depression   . Hypertension   . Thyroid disorder     There are no active problems to display for this patient.   Past Surgical History:  Procedure Laterality Date  . ABDOMINAL HYSTERECTOMY  2013  . BREAST BIOPSY  1995  . GASTRIC BYPASS  2012  . TUBAL LIGATION  1996    OB History    Gravida Para Term Preterm AB Living   2 2       2    SAB TAB Ectopic Multiple Live Births                  Obstetric Comments   1st Menstrual Cycle:  10 1st Pregnancy:  36         Home Medications    Prior to Admission medications   Medication Sig Start Date End Date Taking? Authorizing Provider  levothyroxine (SYNTHROID, LEVOTHROID) 25 MCG tablet Take 25 mcg by mouth daily before breakfast.   Yes [provider]  naproxen (NAPROSYN) 500 MG tablet Take 1 tablet (500 mg total) by mouth 2 (two) times daily. 05/07/16   Joy, Helane Gunther, PA-C    Family History Family History  Problem Relation Age of Onset  . Breast cancer Maternal Grandmother     Social History Social History  Substance Use Topics  . Smoking status: Never Smoker  . Smokeless tobacco: Never Used  . Alcohol use No     Allergies   Patient has no known allergies.   Review of Systems Review of Systems  Constitutional: Negative for fever.  Eyes: Negative for visual disturbance.  Respiratory: Negative for shortness of breath.   Cardiovascular: Negative for chest pain.  Gastrointestinal: Negative for nausea and vomiting.  Musculoskeletal: Negative for neck pain and neck stiffness.  Neurological: Positive for headaches. Negative for dizziness, weakness and numbness.  All other systems reviewed and are negative.    Physical Exam Updated Vital Signs BP 128/82   Pulse  78   Temp 98.5 F (36.9 C) (Oral)   Resp 16   Ht 5\' 7"  (1.702 m)   Wt 104.3 kg (230 lb)   SpO2 100%   BMI 36.02 kg/m   Physical Exam  Constitutional: She is oriented to person, place, and time. She appears well-developed and well-nourished. No distress.  HENT:  Head: Normocephalic and atraumatic.  Right Ear: External ear normal.  Left Ear: External ear normal.  Nose: Nose normal.  Eyes: Pupils are equal, round, and reactive to light. EOM are normal. Right eye exhibits no discharge. Left eye exhibits no discharge.  Neck: Normal range of motion. Neck supple.  Cardiovascular: Normal rate, regular rhythm and normal heart sounds.   Pulmonary/Chest: Effort normal and breath sounds normal.  Abdominal: Soft.  There is no tenderness.  Neurological: She is alert and oriented to person, place, and time.  CN 3-12 grossly intact. 5/5 strength in all 4 extremities. Grossly normal sensation. Normal finger to nose.   Skin: Skin is warm and dry. She is not diaphoretic.  Nursing note and vitals reviewed.    ED Treatments / Results  Labs (all labs ordered are listed, but only abnormal results are displayed) Labs Reviewed - No data to display  EKG  EKG Interpretation None       Radiology No results found.  Procedures Procedures (including critical care time)  Medications Ordered in ED Medications - No data to display   Initial Impression / Assessment and Plan / ED Course  I have reviewed the triage vital signs and the nursing notes.  Pertinent labs & imaging results that were available during my care of the patient were reviewed by me and considered in my medical decision making (see chart for details).     Patient's blood pressure is unremarkable on multiple attempts here. She is well-appearing with minimal frontal headache. Her neuro exam is unremarkable. Her history is not concerning for meningitis, encephalitis, subarachnoid hemorrhage or other head bleed, or brain tumor. Unclear cause but she does know she is quite stressed at work given the nature of her job. She is also not hypertensive here. I think this is readable for an outpatient workup and no emergent treatment or workup is needed currently. Discussed return precautions.  Final Clinical Impressions(s) / ED Diagnoses   Final diagnoses:  Frontal headache    New Prescriptions New Prescriptions   No medications on file     Sherwood Gambler, MD 05/29/17 2018

## 2017-06-04 DIAGNOSIS — R69 Illness, unspecified: Secondary | ICD-10-CM | POA: Diagnosis not present

## 2017-06-16 ENCOUNTER — Emergency Department (HOSPITAL_BASED_OUTPATIENT_CLINIC_OR_DEPARTMENT_OTHER)
Admission: EM | Admit: 2017-06-16 | Discharge: 2017-06-16 | Disposition: A | Discharge status: Home / Self Care | Payer: 59 | Attending: Emergency Medicine | Admitting: Emergency Medicine

## 2017-06-16 ENCOUNTER — Emergency Department (HOSPITAL_COMMUNITY)
Admission: EM | Admit: 2017-06-16 | Discharge: 2017-06-16 | Disposition: A | Discharge status: Home / Self Care | Payer: 59 | Source: Home / Self Care

## 2017-06-16 ENCOUNTER — Encounter (HOSPITAL_COMMUNITY): Payer: Self-pay

## 2017-06-16 ENCOUNTER — Encounter (HOSPITAL_BASED_OUTPATIENT_CLINIC_OR_DEPARTMENT_OTHER): Payer: Self-pay | Admitting: Emergency Medicine

## 2017-06-16 DIAGNOSIS — L02214 Cutaneous abscess of groin: Secondary | ICD-10-CM | POA: Insufficient documentation

## 2017-06-16 DIAGNOSIS — I10 Essential (primary) hypertension: Secondary | ICD-10-CM | POA: Insufficient documentation

## 2017-06-16 DIAGNOSIS — L03116 Cellulitis of left lower limb: Secondary | ICD-10-CM | POA: Insufficient documentation

## 2017-06-16 DIAGNOSIS — L02416 Cutaneous abscess of left lower limb: Secondary | ICD-10-CM | POA: Diagnosis not present

## 2017-06-16 DIAGNOSIS — L02419 Cutaneous abscess of limb, unspecified: Secondary | ICD-10-CM

## 2017-06-16 DIAGNOSIS — Z79899 Other long term (current) drug therapy: Secondary | ICD-10-CM | POA: Insufficient documentation

## 2017-06-16 DIAGNOSIS — L03119 Cellulitis of unspecified part of limb: Secondary | ICD-10-CM

## 2017-06-16 DIAGNOSIS — Z5321 Procedure and treatment not carried out due to patient leaving prior to being seen by health care provider: Secondary | ICD-10-CM

## 2017-06-16 DIAGNOSIS — M79605 Pain in left leg: Secondary | ICD-10-CM | POA: Diagnosis present

## 2017-06-16 MED ORDER — IBUPROFEN 800 MG PO TABS
800.0000 mg | ORAL_TABLET | Freq: Once | ORAL | Status: AC
  Administered 2017-06-16: 800 mg via ORAL
  Filled 2017-06-16: qty 1

## 2017-06-16 MED ORDER — LIDOCAINE-EPINEPHRINE 2 %-1:100000 IJ SOLN
20.0000 mL | Freq: Once | INTRAMUSCULAR | Status: AC
  Administered 2017-06-16: 1 mL

## 2017-06-16 MED ORDER — LIDOCAINE-EPINEPHRINE 2 %-1:100000 IJ SOLN
INTRAMUSCULAR | Status: AC
  Administered 2017-06-16: 1 mL
  Filled 2017-06-16: qty 1

## 2017-06-16 MED ORDER — SULFAMETHOXAZOLE-TRIMETHOPRIM 800-160 MG PO TABS: 1.0000 | ORAL_TABLET | Freq: Two times a day (BID) | ORAL | 0 refills | Status: AC

## 2017-06-16 NOTE — ED Provider Notes (Signed)
[place] DEPT [facility] Provider Note   CSN: [id number] Arrival date & time: 06/16/17  0867     History   Chief Complaint Chief Complaint  Patient presents with  . Abscess    HPI Ashley Stephens is a 41 y.o. female.  The history is provided by the patient.  Abscess  Abscess location: left groin. Abscess quality: fluctuance, induration, painful and warmth   Red streaking: no   Duration:  3 weeks Progression:  Worsening Pain details:    Quality:  Throbbing and sharp   Severity:  Moderate   Timing:  Constant   Progression:  Worsening Chronicity:  New Context comment:  Ingrown hair Relieved by:  Nothing Worsened by:  Draining/squeezing Ineffective treatments:  Draining/squeezing and warm compresses Associated symptoms: no anorexia, no fatigue, no fever, no headaches, no nausea and no vomiting    41 year old female who presents with a small abscess to the left inner thigh. Reports that it started 3 weeks ago as an ingrown hair. She has tried to pop it and squeeze it multiple times after warm compresses throughout the last week. However area seems to be enlarging and more painful. No fevers, chills, nausea or vomiting. No history of immunosuppression. Past Medical History:  Diagnosis Date  . Anxiety   . Depression   . Hypertension   . Thyroid disorder     There are no active problems to display for this patient.   Past Surgical History:  Procedure Laterality Date  . ABDOMINAL HYSTERECTOMY  2013  . BREAST BIOPSY  1995  . GASTRIC BYPASS  2012  . TUBAL LIGATION  1996    OB History    Gravida Para Term Preterm AB Living   2 2       2    SAB TAB Ectopic Multiple Live Births                  Obstetric Comments   1st Menstrual Cycle:  10 1st Pregnancy:  36       Home Medications    Prior to Admission medications   Medication Sig Start Date End Date Taking? Authorizing Provider  levothyroxine (SYNTHROID, LEVOTHROID) 25 MCG tablet Take 25 mcg by  mouth daily before breakfast.    [provider]  naproxen (NAPROSYN) 500 MG tablet Take 1 tablet (500 mg total) by mouth 2 (two) times daily. 05/07/16   Joy, Helane Gunther, PA-C    Family History Family History  Problem Relation Age of Onset  . Breast cancer Maternal Grandmother     Social History Social History  Substance Use Topics  . Smoking status: Never Smoker  . Smokeless tobacco: Never Used  . Alcohol use No     Allergies   Patient has no known allergies.   Review of Systems Review of Systems  Constitutional: Negative for fatigue and fever.  Respiratory: Negative for shortness of breath.   Cardiovascular: Negative for chest pain.  Gastrointestinal: Negative for anorexia, nausea and vomiting.  Allergic/Immunologic: Negative for immunocompromised state.  Neurological: Negative for headaches.  Hematological: Does not bruise/bleed easily.     Physical Exam Updated Vital Signs BP 128/83 (BP Location: Left Arm)   Pulse 79   Temp 98.3 F (36.8 C) (Oral)   Resp 18   Ht 5\' 7"  (1.702 m)   Wt 104.3 kg (230 lb)   SpO2 100%   BMI 36.02 kg/m   Physical Exam Physical Exam  Constitutional: Appears well-developed and well-nourished. No acute distress. HENT:  Head:  Normocephalic.  Eyes: Conjunctivae are normal.  Cardiovascular: Normal rate and intact distal pulses.   Pulmonary/Chest: Effort normal. No respiratory distress.  Abdominal: Exhibits no distension.  Musculoskeletal: Normal range of motion. Exhibits no deformity.  Neurological: Alert. Fluent speech.  Skin: Skin is warm and dry. small 2 x 2 cm area of soft tissue swelling, induration, underlying fluctuance. There is mild surrounding induration of skin Psychiatric: Normal mood and affect. Behavior is normal.  Nursing note and vitals reviewed.   ED Treatments / Results  Labs (all labs ordered are listed, but only abnormal results are displayed) Labs Reviewed - No data to display  EKG  EKG  Interpretation None       Radiology No results found.  Procedures .Marland KitchenIncision and Drainage Date/Time: 06/16/2017 10:52 AM Performed by: Brantley Stage DUO Authorized by: Brantley Stage DUO   Consent:    Consent obtained:  Verbal   Consent given by:  Patient   Risks discussed:  Bleeding, incomplete drainage and infection   Alternatives discussed:  No treatment Location:    Type:  Abscess   Size:  2 x 2 cm   Location: left inner thigh. Pre-procedure details:    Skin preparation:  Betadine Anesthesia (see MAR for exact dosages):    Anesthesia method:  Local infiltration   Local anesthetic:  Lidocaine 2% WITH epi Procedure type:    Complexity:  Simple Procedure details:    Needle aspiration: no     Incision types:  Single straight   Incision depth:  Dermal   Scalpel blade:  11   Wound management:  Probed and deloculated   Drainage:  Bloody and purulent   Drainage amount:  Scant   Wound treatment:  Wound left open   Packing materials:  None Post-procedure details:    Patient tolerance of procedure:  Tolerated well, no immediate complications   (including critical care time)  Medications Ordered in ED Medications  ibuprofen (ADVIL,MOTRIN) tablet 800 mg (not administered)  lidocaine-EPINEPHrine (XYLOCAINE W/EPI) 2 %-1:100000 (with pres) injection 20 mL (not administered)     Initial Impression / Assessment and Plan / ED Course  I have reviewed the triage vital signs and the nursing notes.  Pertinent labs & imaging results that were available during my care of the patient were reviewed by me and considered in my medical decision making (see chart for details).     41 year old female who presents with small abscess in the left inner thigh with mild surrounding cellulitis. I&D at bedside. Will treat w/ bactrim given mild cellulitis. No systemic signs or symptoms of illness. Strict return and follow-up instructions reviewed. She expressed understanding of all discharge  instructions and felt comfortable with the plan of care.   Final Clinical Impressions(s) / ED Diagnoses   Final diagnoses:  Cellulitis and abscess of leg    New Prescriptions New Prescriptions   No medications on file     Forde Dandy, MD 06/16/17 1056

## 2017-06-16 NOTE — ED Notes (Signed)
Pt went to [facility]

## 2017-06-16 NOTE — Discharge Instructions (Signed)
You can shower as needed. Keep wound dry otherwise and covered. Take ibuprofen and tylenol for pain. Take antibiotics for mild skin infection  Return for worsening symptoms, including fever, worsening redness/swelling, escalating pain or any other symptoms concerning to you.

## 2017-06-16 NOTE — ED Triage Notes (Signed)
Patient c/o abscess left groin x 3 weeks.

## 2017-06-16 NOTE — ED Notes (Signed)
ED Provider at bedside. 

## 2017-07-09 DIAGNOSIS — J069 Acute upper respiratory infection, unspecified: Secondary | ICD-10-CM | POA: Diagnosis not present

## 2017-08-02 DIAGNOSIS — R69 Illness, unspecified: Secondary | ICD-10-CM | POA: Diagnosis not present

## 2017-09-13 DIAGNOSIS — R69 Illness, unspecified: Secondary | ICD-10-CM | POA: Diagnosis not present

## 2017-09-27 DIAGNOSIS — L700 Acne vulgaris: Secondary | ICD-10-CM | POA: Diagnosis not present

## 2017-10-11 DIAGNOSIS — R69 Illness, unspecified: Secondary | ICD-10-CM | POA: Diagnosis not present

## 2017-10-31 DIAGNOSIS — Z9884 Bariatric surgery status: Secondary | ICD-10-CM | POA: Diagnosis not present

## 2017-10-31 DIAGNOSIS — K219 Gastro-esophageal reflux disease without esophagitis: Secondary | ICD-10-CM | POA: Diagnosis not present

## 2017-10-31 DIAGNOSIS — R635 Abnormal weight gain: Secondary | ICD-10-CM | POA: Diagnosis not present

## 2017-10-31 DIAGNOSIS — R112 Nausea with vomiting, unspecified: Secondary | ICD-10-CM | POA: Diagnosis not present

## 2017-10-31 DIAGNOSIS — Z6839 Body mass index (BMI) 39.0-39.9, adult: Secondary | ICD-10-CM | POA: Diagnosis not present

## 2017-10-31 DIAGNOSIS — K9189 Other postprocedural complications and disorders of digestive system: Secondary | ICD-10-CM | POA: Diagnosis not present

## 2017-11-08 DIAGNOSIS — R69 Illness, unspecified: Secondary | ICD-10-CM | POA: Diagnosis not present

## 2017-11-18 DIAGNOSIS — N7689 Other specified inflammation of vagina and vulva: Secondary | ICD-10-CM | POA: Diagnosis not present

## 2017-11-29 DIAGNOSIS — K3189 Other diseases of stomach and duodenum: Secondary | ICD-10-CM | POA: Diagnosis not present

## 2017-11-29 DIAGNOSIS — K289 Gastrojejunal ulcer, unspecified as acute or chronic, without hemorrhage or perforation: Secondary | ICD-10-CM | POA: Diagnosis not present

## 2017-11-29 DIAGNOSIS — R05 Cough: Secondary | ICD-10-CM | POA: Diagnosis not present

## 2017-11-29 DIAGNOSIS — Z9119 Patient's noncompliance with other medical treatment and regimen: Secondary | ICD-10-CM | POA: Diagnosis not present

## 2017-11-29 DIAGNOSIS — R1310 Dysphagia, unspecified: Secondary | ICD-10-CM | POA: Diagnosis not present

## 2017-11-29 DIAGNOSIS — G4733 Obstructive sleep apnea (adult) (pediatric): Secondary | ICD-10-CM | POA: Diagnosis not present

## 2017-11-29 DIAGNOSIS — R0683 Snoring: Secondary | ICD-10-CM | POA: Diagnosis not present

## 2017-11-29 DIAGNOSIS — K9189 Other postprocedural complications and disorders of digestive system: Secondary | ICD-10-CM | POA: Diagnosis not present

## 2017-11-29 DIAGNOSIS — E079 Disorder of thyroid, unspecified: Secondary | ICD-10-CM | POA: Diagnosis not present

## 2017-11-29 DIAGNOSIS — E7800 Pure hypercholesterolemia, unspecified: Secondary | ICD-10-CM | POA: Diagnosis not present

## 2017-11-29 DIAGNOSIS — Z01818 Encounter for other preprocedural examination: Secondary | ICD-10-CM | POA: Diagnosis not present

## 2017-11-29 DIAGNOSIS — Z9884 Bariatric surgery status: Secondary | ICD-10-CM | POA: Diagnosis not present

## 2017-11-29 DIAGNOSIS — K259 Gastric ulcer, unspecified as acute or chronic, without hemorrhage or perforation: Secondary | ICD-10-CM | POA: Diagnosis not present

## 2017-11-29 DIAGNOSIS — K219 Gastro-esophageal reflux disease without esophagitis: Secondary | ICD-10-CM | POA: Diagnosis not present

## 2017-11-29 DIAGNOSIS — G4730 Sleep apnea, unspecified: Secondary | ICD-10-CM | POA: Diagnosis not present

## 2017-12-13 ENCOUNTER — Ambulatory Visit: Payer: 59 | Admitting: Nurse Practitioner

## 2017-12-13 ENCOUNTER — Encounter: Payer: Self-pay | Admitting: Nurse Practitioner

## 2017-12-13 VITALS — BP 133/85 | HR 89 | Resp 16 | Ht 67.0 in | Wt 253.8 lb

## 2017-12-13 DIAGNOSIS — E559 Vitamin D deficiency, unspecified: Secondary | ICD-10-CM

## 2017-12-13 DIAGNOSIS — R5383 Other fatigue: Secondary | ICD-10-CM

## 2017-12-13 DIAGNOSIS — E119 Type 2 diabetes mellitus without complications: Secondary | ICD-10-CM

## 2017-12-13 DIAGNOSIS — E039 Hypothyroidism, unspecified: Secondary | ICD-10-CM

## 2017-12-13 DIAGNOSIS — R635 Abnormal weight gain: Secondary | ICD-10-CM | POA: Diagnosis not present

## 2017-12-13 DIAGNOSIS — R7301 Impaired fasting glucose: Secondary | ICD-10-CM | POA: Diagnosis not present

## 2017-12-13 LAB — POCT GLYCOSYLATED HEMOGLOBIN (HGB A1C): HEMOGLOBIN A1C: 5.8

## 2017-12-13 MED ORDER — LEVOTHYROXINE SODIUM 25 MCG PO TABS: 25.0000 ug | ORAL_TABLET | Freq: Every day | ORAL | 3 refills | Status: DC

## 2017-12-13 NOTE — Progress Notes (Signed)
[facility] [place],  [address]  Internal MEDICINE  Office Visit Note  Patient Name: Ashley Stephens  [id number]  [id number]  Date of Service: 12/14/2017  Chief Complaint  Patient presents with  . Diabetes    thinks that she is now a diabetic has concerns that things hasnt gooten better. also wants blood work. last seen for pre-diabetes    The patient is here for follow up visit. Today, she is c/o fatigue, increased thirst, and increased need to urinate. She is afraid she may have fully developed diabetes. At last lab check, she was determined to be borderline for diabetes. She states that she gained back some of the weight she lost after having bariatric surgery. She feels like this may be enough for her to have developed diabetes.    Pt is here for routine follow up.    Current Medication: Outpatient Encounter Medications as of 12/13/2017  Medication Sig  . levothyroxine (SYNTHROID, LEVOTHROID) 25 MCG tablet Take 1 tablet (25 mcg total) by mouth daily before breakfast.  . [DISCONTINUED] levothyroxine (SYNTHROID, LEVOTHROID) 25 MCG tablet Take 25 mcg by mouth daily before breakfast.  . naproxen (NAPROSYN) 500 MG tablet Take 1 tablet (500 mg total) by mouth 2 (two) times daily. (Patient not taking: Reported on 12/13/2017)   No facility-administered encounter medications on file as of 12/13/2017.     Surgical History: Past Surgical History:  Procedure Laterality Date  . ABDOMINAL HYSTERECTOMY  2013  . BREAST BIOPSY  1995  . GASTRIC BYPASS  2012  . TUBAL LIGATION  1996    Medical History: Past Medical History:  Diagnosis Date  . Anxiety   . Depression   . Hypertension   . Thyroid disorder     Family History: Family History  Problem Relation Age of Onset  . Breast cancer Maternal Grandmother     Social History   Socioeconomic History  . Marital status: Single    Spouse name: Not on file  . Number of children: Not on file   . Years of education: Not on file  . Highest education level: Not on file  Occupational History  . Not on file  Social Needs  . Financial resource strain: Not on file  . Food insecurity:    Worry: Not on file    Inability: Not on file  . Transportation needs:    Medical: Not on file    Non-medical: Not on file  Tobacco Use  . Smoking status: Never Smoker  . Smokeless tobacco: Never Used  Substance and Sexual Activity  . Alcohol use: No    Alcohol/week: 0.0 oz  . Drug use: No  . Sexual activity: Yes    Birth control/protection: Surgical  Lifestyle  . Physical activity:    Days per week: Not on file    Minutes per session: Not on file  . Stress: Not on file  Relationships  . Social connections:    Talks on phone: Not on file    Gets together: Not on file    Attends religious service: Not on file    Active member of club or organization: Not on file    Attends meetings of clubs or organizations: Not on file    Relationship status: Not on file  . Intimate partner violence:    Fear of current or ex partner: Not on file    Emotionally abused: Not on file    Physically abused: Not on file  Forced sexual activity: Not on file  Other Topics Concern  . Not on file  Social History Narrative  . Not on file      Review of Systems  Constitutional: Positive for fatigue and unexpected weight change. Negative for activity change and chills.  HENT: Negative for congestion, postnasal drip, rhinorrhea, sneezing and sore throat.   Eyes: Negative.  Negative for redness.  Respiratory: Negative for cough, chest tightness, shortness of breath and wheezing.   Cardiovascular: Negative for chest pain and palpitations.  Gastrointestinal: Positive for abdominal pain. Negative for constipation, diarrhea, nausea and vomiting.       Patient having intermittent epigastric pain. Has seen her bariatric surgeon and is currently taking medication to treat gastric ulcer.   Endocrine: Positive for  polydipsia and polyuria.  Genitourinary: Negative for dysuria and frequency.  Musculoskeletal: Negative for arthralgias, back pain, joint swelling and neck pain.  Skin: Negative for rash.  Allergic/Immunologic: Negative for environmental allergies.  Neurological: Negative for dizziness, tremors, numbness and headaches.  Hematological: Negative for adenopathy. Does not bruise/bleed easily.  Psychiatric/Behavioral: Negative for behavioral problems (Depression), sleep disturbance and suicidal ideas. The patient is not nervous/anxious.     Vital Signs: BP 133/85 (BP Location: Right Arm, Patient Position: Sitting, Cuff Size: Large)   Pulse 89   Resp 16   Ht 5\' 7"  (1.702 m)   Wt 253 lb 12.8 oz (115.1 kg)   SpO2 97%   BMI 39.75 kg/m    Physical Exam  Constitutional: She is oriented to person, place, and time. She appears well-developed and well-nourished.  HENT:  Head: Normocephalic and atraumatic.  Eyes: Pupils are equal, round, and reactive to light. EOM are normal.  Neck: Normal range of motion. Neck supple. No thyromegaly present.  Cardiovascular: Normal rate, regular rhythm and normal heart sounds.  Pulmonary/Chest: Effort normal and breath sounds normal. She has no wheezes.  Abdominal: Soft. Bowel sounds are normal. There is tenderness.  Musculoskeletal: Normal range of motion.  Lymphadenopathy:    She has no cervical adenopathy.  Neurological: She is alert and oriented to person, place, and time.  Skin: Skin is warm and dry.  Psychiatric: She has a normal mood and affect. Her behavior is normal. Judgment and thought content normal.  Nursing note and vitals reviewed.  Assessment/Plan: 1. Type 2 diabetes mellitus without complication, without long-term current use of insulin (HCC) - POCT HgB A1C 5.8 today. Patient at slight increased risk for developing diabetes. Diet recommendations provided.   2. Impaired fasting glucose Check labs, including CMP and TSH and free t4. HgbA1c  indicates only slight increased risk for developing diabetes. Dietary recommendations provided.  - T4, free  3. Fatigue, unspecified type - CBC with Differential/Platelet - Comprehensive metabolic panel - T4, free - TSH  4. Abnormal weight gain - T4, free - TSH - Lipid panel  5. Vitamin D deficiency - Vitamin D 1,25 dihydroxy  6. Acquired hypothyroidism - levothyroxine (SYNTHROID, LEVOTHROID) 25 MCG tablet; Take 1 tablet (25 mcg total) by mouth daily before breakfast.  Dispense: 30 tablet; Refill: 3   General Counseling: Catherene verbalizes understanding of the findings of todays visit and agrees with plan of treatment. I have discussed any further diagnostic evaluation that may be needed or ordered today. We also reviewed her medications today. she has been encouraged to call the office with any questions or concerns that should arise related to todays visit.  This patient was seen by Leretha Pol, FNP- C in Collaboration with Dr  Lavera Guise as a part of collaborative care agreement    Orders Placed This Encounter  Procedures  . CBC with Differential/Platelet  . Comprehensive metabolic panel  . T4, free  . TSH  . Lipid panel  . Vitamin D 1,25 dihydroxy  . POCT HgB A1C    Meds ordered this encounter  Medications  . levothyroxine (SYNTHROID, LEVOTHROID) 25 MCG tablet    Sig: Take 1 tablet (25 mcg total) by mouth daily before breakfast.    Dispense:  30 tablet    Refill:  3    Order Specific Question:   Supervising Provider    Answer:   Lavera Guise [[id number]]    Time spent: 46 Minutes    Dr Lavera Guise Internal medicine

## 2017-12-14 DIAGNOSIS — E1165 Type 2 diabetes mellitus with hyperglycemia: Secondary | ICD-10-CM | POA: Insufficient documentation

## 2017-12-14 DIAGNOSIS — R635 Abnormal weight gain: Secondary | ICD-10-CM | POA: Insufficient documentation

## 2017-12-14 DIAGNOSIS — R7301 Impaired fasting glucose: Secondary | ICD-10-CM | POA: Insufficient documentation

## 2017-12-14 DIAGNOSIS — R5383 Other fatigue: Secondary | ICD-10-CM | POA: Insufficient documentation

## 2017-12-14 DIAGNOSIS — E11649 Type 2 diabetes mellitus with hypoglycemia without coma: Secondary | ICD-10-CM | POA: Insufficient documentation

## 2017-12-14 DIAGNOSIS — E039 Hypothyroidism, unspecified: Secondary | ICD-10-CM | POA: Insufficient documentation

## 2017-12-14 DIAGNOSIS — E559 Vitamin D deficiency, unspecified: Secondary | ICD-10-CM | POA: Insufficient documentation

## 2017-12-27 DIAGNOSIS — R5383 Other fatigue: Secondary | ICD-10-CM | POA: Diagnosis not present

## 2017-12-27 DIAGNOSIS — R635 Abnormal weight gain: Secondary | ICD-10-CM | POA: Diagnosis not present

## 2017-12-27 DIAGNOSIS — R7301 Impaired fasting glucose: Secondary | ICD-10-CM | POA: Diagnosis not present

## 2017-12-27 DIAGNOSIS — E559 Vitamin D deficiency, unspecified: Secondary | ICD-10-CM | POA: Diagnosis not present

## 2018-01-02 DIAGNOSIS — K9189 Other postprocedural complications and disorders of digestive system: Secondary | ICD-10-CM | POA: Diagnosis not present

## 2018-01-02 DIAGNOSIS — Z9884 Bariatric surgery status: Secondary | ICD-10-CM | POA: Diagnosis not present

## 2018-01-02 DIAGNOSIS — K289 Gastrojejunal ulcer, unspecified as acute or chronic, without hemorrhage or perforation: Secondary | ICD-10-CM | POA: Diagnosis not present

## 2018-01-03 DIAGNOSIS — R69 Illness, unspecified: Secondary | ICD-10-CM | POA: Diagnosis not present

## 2018-01-03 LAB — LIPID PANEL
CHOLESTEROL TOTAL: 207 mg/dL — AB (ref 100–199)
Chol/HDL Ratio: 4.1 ratio (ref 0.0–4.4)
HDL: 51 mg/dL (ref >39)
LDL Calculated: 138 mg/dL — ABNORMAL HIGH (ref 0–99)
TRIGLYCERIDES: 90 mg/dL (ref 0–149)
VLDL Cholesterol Cal: 18 mg/dL (ref 5–40)

## 2018-01-03 LAB — CBC WITH DIFFERENTIAL/PLATELET
BASOS ABS: 0 10*3/uL (ref 0.0–0.2)
Basos: 0 %
EOS (ABSOLUTE): 0.1 10*3/uL (ref 0.0–0.4)
Eos: 1 %
Hematocrit: 40.9 % (ref 34.0–46.6)
Hemoglobin: 13.9 g/dL (ref 11.1–15.9)
Immature Grans (Abs): 0 10*3/uL (ref 0.0–0.1)
Immature Granulocytes: 0 %
LYMPHS ABS: 1.8 10*3/uL (ref 0.7–3.1)
LYMPHS: 38 %
MCH: 29 pg (ref 26.6–33.0)
MCHC: 34 g/dL (ref 31.5–35.7)
MCV: 85 fL (ref 79–97)
Monocytes Absolute: 0.3 10*3/uL (ref 0.1–0.9)
Monocytes: 7 %
NEUTROS ABS: 2.5 10*3/uL (ref 1.4–7.0)
Neutrophils: 54 %
PLATELETS: 331 10*3/uL (ref 150–379)
RBC: 4.8 x10E6/uL (ref 3.77–5.28)
RDW: 13.3 % (ref 12.3–15.4)
WBC: 4.7 10*3/uL (ref 3.4–10.8)

## 2018-01-03 LAB — COMPREHENSIVE METABOLIC PANEL
ALK PHOS: 67 IU/L (ref 39–117)
ALT: 10 IU/L (ref 0–32)
AST: 16 IU/L (ref 0–40)
Albumin/Globulin Ratio: 1.7 (ref 1.2–2.2)
Albumin: 4.2 g/dL (ref 3.5–5.5)
BILIRUBIN TOTAL: 0.2 mg/dL (ref 0.0–1.2)
BUN/Creatinine Ratio: 10 (ref 9–23)
BUN: 10 mg/dL (ref 6–24)
CHLORIDE: 101 mmol/L (ref 96–106)
CO2: 23 mmol/L (ref 20–29)
Calcium: 8.9 mg/dL (ref 8.7–10.2)
Creatinine, Ser: 0.97 mg/dL (ref 0.57–1.00)
GFR calc Af Amer: 82 mL/min/{1.73_m2} (ref >59)
GFR calc non Af Amer: 71 mL/min/{1.73_m2} (ref >59)
GLUCOSE: 104 mg/dL — AB (ref 65–99)
Globulin, Total: 2.5 g/dL (ref 1.5–4.5)
Potassium: 4.4 mmol/L (ref 3.5–5.2)
Sodium: 138 mmol/L (ref 134–144)
Total Protein: 6.7 g/dL (ref 6.0–8.5)

## 2018-01-03 LAB — T4, FREE: FREE T4: 0.91 ng/dL (ref 0.82–1.77)

## 2018-01-03 LAB — VITAMIN D 1,25 DIHYDROXY
VITAMIN D3 1, 25 (OH): 44 pg/mL
Vitamin D 1, 25 (OH)2 Total: 44 pg/mL

## 2018-01-03 LAB — TSH: TSH: 2.89 u[IU]/mL (ref 0.450–4.500)

## 2018-01-17 ENCOUNTER — Ambulatory Visit: Payer: Self-pay | Admitting: Nurse Practitioner

## 2018-01-24 DIAGNOSIS — E785 Hyperlipidemia, unspecified: Secondary | ICD-10-CM | POA: Diagnosis not present

## 2018-01-24 DIAGNOSIS — T50904A Poisoning by unspecified drugs, medicaments and biological substances, undetermined, initial encounter: Secondary | ICD-10-CM | POA: Diagnosis not present

## 2018-01-24 DIAGNOSIS — R69 Illness, unspecified: Secondary | ICD-10-CM | POA: Diagnosis not present

## 2018-01-24 DIAGNOSIS — R42 Dizziness and giddiness: Secondary | ICD-10-CM | POA: Diagnosis not present

## 2018-02-05 ENCOUNTER — Other Ambulatory Visit: Payer: Self-pay

## 2018-02-05 ENCOUNTER — Encounter (HOSPITAL_BASED_OUTPATIENT_CLINIC_OR_DEPARTMENT_OTHER): Payer: Self-pay | Admitting: Emergency Medicine

## 2018-02-05 ENCOUNTER — Emergency Department (HOSPITAL_BASED_OUTPATIENT_CLINIC_OR_DEPARTMENT_OTHER)
Admission: EM | Admit: 2018-02-05 | Discharge: 2018-02-05 | Disposition: A | Discharge status: Home / Self Care | Payer: 59 | Attending: Emergency Medicine | Admitting: Emergency Medicine

## 2018-02-05 DIAGNOSIS — F419 Anxiety disorder, unspecified: Secondary | ICD-10-CM | POA: Diagnosis not present

## 2018-02-05 DIAGNOSIS — E119 Type 2 diabetes mellitus without complications: Secondary | ICD-10-CM | POA: Diagnosis not present

## 2018-02-05 DIAGNOSIS — E039 Hypothyroidism, unspecified: Secondary | ICD-10-CM | POA: Diagnosis not present

## 2018-02-05 DIAGNOSIS — I10 Essential (primary) hypertension: Secondary | ICD-10-CM | POA: Diagnosis not present

## 2018-02-05 DIAGNOSIS — F329 Major depressive disorder, single episode, unspecified: Secondary | ICD-10-CM | POA: Insufficient documentation

## 2018-02-05 DIAGNOSIS — Z9884 Bariatric surgery status: Secondary | ICD-10-CM | POA: Diagnosis not present

## 2018-02-05 DIAGNOSIS — R69 Illness, unspecified: Secondary | ICD-10-CM | POA: Diagnosis not present

## 2018-02-05 DIAGNOSIS — Z79899 Other long term (current) drug therapy: Secondary | ICD-10-CM | POA: Insufficient documentation

## 2018-02-05 DIAGNOSIS — R1013 Epigastric pain: Secondary | ICD-10-CM | POA: Insufficient documentation

## 2018-02-05 LAB — CBC WITH DIFFERENTIAL/PLATELET
BASOS ABS: 0 10*3/uL (ref 0.0–0.1)
Basophils Relative: 0 %
EOS ABS: 0.1 10*3/uL (ref 0.0–0.7)
EOS PCT: 2 %
HCT: 39.5 % (ref 36.0–46.0)
Hemoglobin: 13.4 g/dL (ref 12.0–15.0)
LYMPHS PCT: 29 %
Lymphs Abs: 1.4 10*3/uL (ref 0.7–4.0)
MCH: 28.8 pg (ref 26.0–34.0)
MCHC: 33.9 g/dL (ref 30.0–36.0)
MCV: 84.9 fL (ref 78.0–100.0)
MONO ABS: 0.4 10*3/uL (ref 0.1–1.0)
Monocytes Relative: 9 %
Neutro Abs: 2.8 10*3/uL (ref 1.7–7.7)
Neutrophils Relative %: 60 %
PLATELETS: 294 10*3/uL (ref 150–400)
RBC: 4.65 MIL/uL (ref 3.87–5.11)
RDW: 12.7 % (ref 11.5–15.5)
WBC: 4.7 10*3/uL (ref 4.0–10.5)

## 2018-02-05 LAB — COMPREHENSIVE METABOLIC PANEL
ALT: 16 U/L (ref 14–54)
AST: 19 U/L (ref 15–41)
Albumin: 3.7 g/dL (ref 3.5–5.0)
Alkaline Phosphatase: 61 U/L (ref 38–126)
Anion gap: 7 (ref 5–15)
BUN: 8 mg/dL (ref 6–20)
CHLORIDE: 104 mmol/L (ref 101–111)
CO2: 26 mmol/L (ref 22–32)
Calcium: 9 mg/dL (ref 8.9–10.3)
Creatinine, Ser: 0.79 mg/dL (ref 0.44–1.00)
GFR calc Af Amer: >60 mL/min (ref >60)
Glucose, Bld: 105 mg/dL — ABNORMAL HIGH (ref 65–99)
POTASSIUM: 3.7 mmol/L (ref 3.5–5.1)
SODIUM: 137 mmol/L (ref 135–145)
Total Bilirubin: 0.2 mg/dL — ABNORMAL LOW (ref 0.3–1.2)
Total Protein: 6.7 g/dL (ref 6.5–8.1)

## 2018-02-05 LAB — LIPASE, BLOOD: LIPASE: 30 U/L (ref 11–51)

## 2018-02-05 MED ORDER — SODIUM CHLORIDE 0.9 % IV BOLUS
500.0000 mL | Freq: Once | INTRAVENOUS | Status: AC
  Administered 2018-02-05: 500 mL via INTRAVENOUS

## 2018-02-05 MED ORDER — GI COCKTAIL ~~LOC~~
30.0000 mL | Freq: Once | ORAL | Status: AC
  Administered 2018-02-05: 30 mL via ORAL
  Filled 2018-02-05: qty 30

## 2018-02-05 MED ORDER — FAMOTIDINE IN NACL 20-0.9 MG/50ML-% IV SOLN
20.0000 mg | Freq: Once | INTRAVENOUS | Status: AC
  Administered 2018-02-05: 20 mg via INTRAVENOUS
  Filled 2018-02-05: qty 50

## 2018-02-05 MED ORDER — FAMOTIDINE 20 MG PO TABS
20.0000 mg | ORAL_TABLET | Freq: Two times a day (BID) | ORAL | 0 refills | Status: DC
Start: 2018-02-05 — End: 2018-06-20

## 2018-02-05 MED ORDER — OMEPRAZOLE 20 MG PO CPDR
20.0000 mg | DELAYED_RELEASE_CAPSULE | Freq: Two times a day (BID) | ORAL | 0 refills | Status: DC
Start: 2018-02-05 — End: 2018-06-20

## 2018-02-05 MED FILL — FAMOTIDINE 20 MG TABLET: 20 | 15 days supply | Qty: 30 | Fill #0

## 2018-02-05 MED FILL — OMEPRAZOLE 20 MG CAP: 20 | 30 days supply | Qty: 60 | Fill #0

## 2018-02-05 NOTE — ED Triage Notes (Signed)
Pt c/o "my ulcer is acting up"; followed by GI s/p gastric bypass 2013; Carafate and prilosec not helping

## 2018-02-05 NOTE — Discharge Instructions (Signed)
It was my pleasure taking care of you today!   Please call your doctor today or tomorrow to try to move up your appointment.   Return to ER for new or worsening symptoms, any additional concerns.

## 2018-02-05 NOTE — ED Provider Notes (Addendum)
[facility] EMERGENCY DEPARTMENT Provider Note   CSN: [id number] Arrival date & time: 02/05/18  0944     History   Chief Complaint Chief Complaint  Patient presents with  . Abdominal Pain    HPI Ashley Stephens is a 42 y.o. female.  The history is provided by the patient and medical records. No language interpreter was used.  Abdominal Pain   Pertinent negatives include diarrhea, nausea, vomiting and constipation.   Ashley Stephens is a 42 y.o. female  with a PMH of prior gastric bypass RNY in 2012 who presents to the Emergency Department complaining of persistent epigastric pain which she describes as a "churning, acidic" pain which began over the weekend.  She reports that she was at the beach and had a few mixed drinks which is not usual for her.  She reports the following day is when her symptoms began.  She has tried Carafate 4 times a day with little improvement.  She is out of her prescription 20 mg omeprazole, however has been taking over-the-counter omeprazole which is not helping either.  She has an appointment with her bariatric specialist at the end of the month, but did not feel as if she could wait due to her pain.  No fever, chills, nausea, vomiting, blood in the stool.  She reports her pain today feels similar to when she was diagnosed with an ulcer a while back.  This was diagnosed via endoscopy back in March 2019.  Past Medical History:  Diagnosis Date  . Anxiety   . Depression   . Hypertension   . Thyroid disorder     Patient Active Problem List   Diagnosis Date Noted  . Uncontrolled type 2 diabetes mellitus with hypoglycemia ([place]) 12/14/2017  . Impaired fasting glucose 12/14/2017  . Fatigue 12/14/2017  . Abnormal weight gain 12/14/2017  . Vitamin D deficiency 12/14/2017  . Acquired hypothyroidism 12/14/2017    Past Surgical History:  Procedure Laterality Date  . ABDOMINAL HYSTERECTOMY  2013  . BREAST BIOPSY  1995  . GASTRIC  BYPASS  2012  . TUBAL LIGATION  1996     OB History    Gravida  2   Para  2   Term      Preterm      AB      Living  2     SAB      TAB      Ectopic      Multiple      Live Births           Obstetric Comments  1st Menstrual Cycle:  10 1st Pregnancy:  36         Home Medications    Prior to Admission medications   Medication Sig Start Date End Date Taking? Authorizing Provider  Sucralfate (CARAFATE PO) Take by mouth.   Yes [provider]  famotidine (PEPCID) 20 MG tablet Take 1 tablet (20 mg total) by mouth 2 (two) times daily. 02/05/18   Aerielle Stoklosa, Ozella Almond, PA-C  levothyroxine (SYNTHROID, LEVOTHROID) 25 MCG tablet Take 1 tablet (25 mcg total) by mouth daily before breakfast. 12/13/17   Ronnell Freshwater, NP  naproxen (NAPROSYN) 500 MG tablet Take 1 tablet (500 mg total) by mouth 2 (two) times daily. Patient not taking: Reported on 12/13/2017 05/07/16   Lorayne Bender, PA-C  omeprazole (PRILOSEC) 20 MG capsule Take 1 capsule (20 mg total) by mouth 2 (two) times daily before a meal. 02/05/18  Lavonya Hoerner, Ozella Almond, PA-C    Family History Family History  Problem Relation Age of Onset  . Breast cancer Maternal Grandmother     Social History Social History   Tobacco Use  . Smoking status: Never Smoker  . Smokeless tobacco: Never Used  Substance Use Topics  . Alcohol use: No    Alcohol/week: 0.0 oz  . Drug use: No     Allergies   Patient has no known allergies.   Review of Systems Review of Systems  Respiratory: Negative for shortness of breath.   Cardiovascular: Negative for chest pain.  Gastrointestinal: Positive for abdominal pain. Negative for blood in stool, constipation, diarrhea, nausea and vomiting.  All other systems reviewed and are negative.    Physical Exam Updated Vital Signs BP 118/73 (BP Location: Right Arm)   Pulse 76   Temp 98.4 F (36.9 C) (Oral)   Resp 18   Ht 5\' 7"  (1.702 m)   Wt 111.1 kg (245 lb)   SpO2 99%    BMI 38.37 kg/m   Physical Exam  Constitutional: She is oriented to person, place, and time. She appears well-developed and well-nourished. No distress.  HENT:  Head: Normocephalic and atraumatic.  Neck: Neck supple.  Cardiovascular: Normal rate, regular rhythm and normal heart sounds.  No murmur heard. Pulmonary/Chest: Effort normal and breath sounds normal. No respiratory distress.  Abdominal: Soft. She exhibits no distension. There is tenderness.  Tenderness to palpation of epigastrium. Negative Murphy's. No rebound or guarding.  Neurological: She is alert and oriented to person, place, and time.  Skin: Skin is warm and dry.  Nursing note and vitals reviewed.    ED Treatments / Results  Labs (all labs ordered are listed, but only abnormal results are displayed) Labs Reviewed  COMPREHENSIVE METABOLIC PANEL - Abnormal; Notable for the following components:      Result Value   Glucose, Bld 105 (*)    Total Bilirubin 0.2 (*)    All other components within normal limits  CBC WITH DIFFERENTIAL/PLATELET  LIPASE, BLOOD    EKG None  Radiology No results found.  Procedures Procedures (including critical care time)  Medications Ordered in ED Medications  gi cocktail (Maalox,Lidocaine,Donnatal) (30 mLs Oral Given 02/05/18 1035)  famotidine (PEPCID) IVPB 20 mg premix (0 mg Intravenous Stopped 02/05/18 1133)  sodium chloride 0.9 % bolus 500 mL (0 mLs Intravenous Stopped 02/05/18 1133)     Initial Impression / Assessment and Plan / ED Course  I have reviewed the triage vital signs and the nursing notes.  Pertinent labs & imaging results that were available during my care of the patient were reviewed by me and considered in my medical decision making (see chart for details).    Ashley Stephens is a 42 y.o. female who presents to ED for epigastric pain c/w her previous when seen by her GI and diagnosed with ulcer. Patient is nontoxic, nonseptic appearing with a non-surgical  abdominal exam. Patient's pain and other symptoms have been adequately managed in the Emergency Department today. Fluid bolus / gi cocktail / pepcid given.  Lab and vitals reviewed and reassuring. Normal liver labs, white count, lipase.Patient does not meet the SIRS or Sepsis criteria. On repeat exam, abdominal exam with no peritoneal signs. No indication of appendicitis, bowel obstruction, bowel perforation, cholecystitis, diverticulitis. Patient discharged home with symptomatic treatment and encouraged to follow up with her GI doc. I have also discussed reasons to return immediately to the ER. Refilled her rx for prilosec.  Patient expresses understanding and agrees with plan as dictated above.  Patient discussed with Dr. Ralene Bathe who agrees with treatment plan.   Final Clinical Impressions(s) / ED Diagnoses   Final diagnoses:  Epigastric abdominal pain    ED Discharge Orders        Ordered    omeprazole (PRILOSEC) 20 MG capsule  2 times daily before meals     02/05/18 1201    famotidine (PEPCID) 20 MG tablet  2 times daily     02/05/18 1201       Vanderbilt Ranieri, Ozella Almond, PA-C 02/05/18 1227    Paton Crum, Ozella Almond, PA-C 02/05/18 1227    Quintella Reichert, MD 02/05/18 1553

## 2018-02-28 DIAGNOSIS — R69 Illness, unspecified: Secondary | ICD-10-CM | POA: Diagnosis not present

## 2018-03-08 DIAGNOSIS — E038 Other specified hypothyroidism: Secondary | ICD-10-CM | POA: Diagnosis not present

## 2018-03-08 DIAGNOSIS — Z760 Encounter for issue of repeat prescription: Secondary | ICD-10-CM | POA: Diagnosis not present

## 2018-03-28 DIAGNOSIS — L700 Acne vulgaris: Secondary | ICD-10-CM | POA: Diagnosis not present

## 2018-04-25 DIAGNOSIS — R69 Illness, unspecified: Secondary | ICD-10-CM | POA: Diagnosis not present

## 2018-04-29 DIAGNOSIS — R69 Illness, unspecified: Secondary | ICD-10-CM | POA: Diagnosis not present

## 2018-05-07 DIAGNOSIS — M9901 Segmental and somatic dysfunction of cervical region: Secondary | ICD-10-CM | POA: Diagnosis not present

## 2018-05-07 DIAGNOSIS — M9902 Segmental and somatic dysfunction of thoracic region: Secondary | ICD-10-CM | POA: Diagnosis not present

## 2018-05-07 DIAGNOSIS — M9905 Segmental and somatic dysfunction of pelvic region: Secondary | ICD-10-CM | POA: Diagnosis not present

## 2018-05-07 DIAGNOSIS — M9903 Segmental and somatic dysfunction of lumbar region: Secondary | ICD-10-CM | POA: Diagnosis not present

## 2018-05-08 DIAGNOSIS — M9901 Segmental and somatic dysfunction of cervical region: Secondary | ICD-10-CM | POA: Diagnosis not present

## 2018-05-08 DIAGNOSIS — M9905 Segmental and somatic dysfunction of pelvic region: Secondary | ICD-10-CM | POA: Diagnosis not present

## 2018-05-08 DIAGNOSIS — M9903 Segmental and somatic dysfunction of lumbar region: Secondary | ICD-10-CM | POA: Diagnosis not present

## 2018-05-08 DIAGNOSIS — M9902 Segmental and somatic dysfunction of thoracic region: Secondary | ICD-10-CM | POA: Diagnosis not present

## 2018-05-12 DIAGNOSIS — R69 Illness, unspecified: Secondary | ICD-10-CM | POA: Diagnosis not present

## 2018-05-14 DIAGNOSIS — M9901 Segmental and somatic dysfunction of cervical region: Secondary | ICD-10-CM | POA: Diagnosis not present

## 2018-05-14 DIAGNOSIS — M9902 Segmental and somatic dysfunction of thoracic region: Secondary | ICD-10-CM | POA: Diagnosis not present

## 2018-05-14 DIAGNOSIS — M9903 Segmental and somatic dysfunction of lumbar region: Secondary | ICD-10-CM | POA: Diagnosis not present

## 2018-05-14 DIAGNOSIS — M9905 Segmental and somatic dysfunction of pelvic region: Secondary | ICD-10-CM | POA: Diagnosis not present

## 2018-05-19 DIAGNOSIS — M9901 Segmental and somatic dysfunction of cervical region: Secondary | ICD-10-CM | POA: Diagnosis not present

## 2018-05-19 DIAGNOSIS — M9902 Segmental and somatic dysfunction of thoracic region: Secondary | ICD-10-CM | POA: Diagnosis not present

## 2018-05-19 DIAGNOSIS — M9905 Segmental and somatic dysfunction of pelvic region: Secondary | ICD-10-CM | POA: Diagnosis not present

## 2018-05-19 DIAGNOSIS — M9903 Segmental and somatic dysfunction of lumbar region: Secondary | ICD-10-CM | POA: Diagnosis not present

## 2018-05-22 ENCOUNTER — Encounter: Payer: Self-pay | Admitting: Adult Health

## 2018-05-22 ENCOUNTER — Ambulatory Visit (INDEPENDENT_AMBULATORY_CARE_PROVIDER_SITE_OTHER): Payer: 59 | Admitting: Adult Health

## 2018-05-22 VITALS — BP 131/94 | HR 60 | Resp 16 | Ht 67.0 in | Wt 257.0 lb

## 2018-05-22 DIAGNOSIS — R030 Elevated blood-pressure reading, without diagnosis of hypertension: Secondary | ICD-10-CM

## 2018-05-22 DIAGNOSIS — R5383 Other fatigue: Secondary | ICD-10-CM

## 2018-05-22 DIAGNOSIS — M5136 Other intervertebral disc degeneration, lumbar region: Secondary | ICD-10-CM

## 2018-05-22 DIAGNOSIS — R635 Abnormal weight gain: Secondary | ICD-10-CM

## 2018-05-22 DIAGNOSIS — E039 Hypothyroidism, unspecified: Secondary | ICD-10-CM | POA: Diagnosis not present

## 2018-05-22 DIAGNOSIS — E1165 Type 2 diabetes mellitus with hyperglycemia: Secondary | ICD-10-CM

## 2018-05-22 LAB — POCT GLYCOSYLATED HEMOGLOBIN (HGB A1C): HEMOGLOBIN A1C: 6 % — AB (ref 4.0–5.6)

## 2018-05-22 MED ORDER — LEVOTHYROXINE SODIUM 25 MCG PO TABS: 25.0000 ug | ORAL_TABLET | Freq: Every day | ORAL | 3 refills | Status: DC

## 2018-05-22 MED ORDER — TRAMADOL HCL 50 MG PO TABS: 50.0000 mg | ORAL_TABLET | Freq: Two times a day (BID) | ORAL | 0 refills | Status: DC | PRN

## 2018-05-22 NOTE — Patient Instructions (Signed)

## 2018-05-22 NOTE — Progress Notes (Signed)
[facility] [place], [place] [address]  Internal MEDICINE  Office Visit Note  Patient Name: Ashley Stephens  [id number]  [id number]  Date of Service: 05/29/2018  Chief Complaint  Patient presents with  . Hypothyroidism  . Quality Metric Gaps    physical    HPI Pt here for follow up on hypothyroidism.  Her A1C is 6.0 today. She reports she is fatigued and tired.  She is worried about her thyroid because she is gaining weight.  She reports profuse sweating, excessive thirst, and urinating more frequently.   Her labs were drawn and April and showed her TSH was still in normal range, however it had over doubled since last known draw. Will repeat at this time, to evaluate for medication adjustment. She is due for Physical.    Current Medication: Outpatient Encounter Medications as of 05/22/2018  Medication Sig  . famotidine (PEPCID) 20 MG tablet Take 1 tablet (20 mg total) by mouth 2 (two) times daily.  Marland Kitchen levothyroxine (SYNTHROID, LEVOTHROID) 25 MCG tablet Take 1 tablet (25 mcg total) by mouth daily before breakfast.  . naproxen (NAPROSYN) 500 MG tablet Take 1 tablet (500 mg total) by mouth 2 (two) times daily.  Marland Kitchen omeprazole (PRILOSEC) 20 MG capsule Take 1 capsule (20 mg total) by mouth 2 (two) times daily before a meal.  . [DISCONTINUED] levothyroxine (SYNTHROID, LEVOTHROID) 25 MCG tablet Take 1 tablet (25 mcg total) by mouth daily before breakfast.  . Sucralfate (CARAFATE PO) Take by mouth.  . traMADol (ULTRAM) 50 MG tablet Take 1 tablet (50 mg total) by mouth 2 (two) times daily as needed.   No facility-administered encounter medications on file as of 05/22/2018.     Surgical History: Past Surgical History:  Procedure Laterality Date  . ABDOMINAL HYSTERECTOMY  2013  . BREAST BIOPSY  1995  . GASTRIC BYPASS  2012  . TUBAL LIGATION  1996    Medical History: Past Medical History:  Diagnosis Date  . Anxiety   . Depression   . Hypertension   .  Thyroid disorder     Family History: Family History  Problem Relation Age of Onset  . Breast cancer Maternal Grandmother     Social History   Socioeconomic History  . Marital status: Single    Spouse name: Not on file  . Number of children: Not on file  . Years of education: Not on file  . Highest education level: Not on file  Occupational History  . Not on file  Social Needs  . Financial resource strain: Not on file  . Food insecurity:    Worry: Not on file    Inability: Not on file  . Transportation needs:    Medical: Not on file    Non-medical: Not on file  Tobacco Use  . Smoking status: Never Smoker  . Smokeless tobacco: Never Used  Substance and Sexual Activity  . Alcohol use: No    Alcohol/week: 0.0 standard drinks  . Drug use: No  . Sexual activity: Yes    Birth control/protection: Surgical  Lifestyle  . Physical activity:    Days per week: Not on file    Minutes per session: Not on file  . Stress: Not on file  Relationships  . Social connections:    Talks on phone: Not on file    Gets together: Not on file    Attends religious service: Not on file    Active member of club or organization: Not on  file    Attends meetings of clubs or organizations: Not on file    Relationship status: Not on file  . Intimate partner violence:    Fear of current or ex partner: Not on file    Emotionally abused: Not on file    Physically abused: Not on file    Forced sexual activity: Not on file  Other Topics Concern  . Not on file  Social History Narrative  . Not on file   Review of Systems  Constitutional: Positive for fatigue and unexpected weight change. Negative for chills.  HENT: Negative for congestion, rhinorrhea, sneezing and sore throat.   Eyes: Negative for photophobia, pain and redness.  Respiratory: Negative for cough, chest tightness and shortness of breath.   Cardiovascular: Negative for chest pain and palpitations.  Gastrointestinal: Negative for  abdominal pain, constipation, diarrhea, nausea and vomiting.  Endocrine: Negative.   Genitourinary: Negative for dysuria and frequency.  Musculoskeletal: Negative for arthralgias, back pain, joint swelling and neck pain.  Skin: Negative for rash.  Allergic/Immunologic: Negative.   Neurological: Negative for tremors and numbness.  Hematological: Negative for adenopathy. Does not bruise/bleed easily.  Psychiatric/Behavioral: Negative for behavioral problems and sleep disturbance. The patient is not nervous/anxious.    Vital Signs: BP (!) 131/94   Pulse 60   Resp 16   Ht 5\' 7"  (1.702 m)   Wt 257 lb (116.6 kg)   SpO2 99%   BMI 40.25 kg/m   Physical Exam  Constitutional: She is oriented to person, place, and time. She appears well-developed and well-nourished. No distress.  HENT:  Head: Normocephalic and atraumatic.  Mouth/Throat: Oropharynx is clear and moist. No oropharyngeal exudate.  Eyes: Pupils are equal, round, and reactive to light. EOM are normal.  Neck: Normal range of motion. Neck supple. No JVD present. No tracheal deviation present. No thyromegaly present.  Cardiovascular: Normal rate, regular rhythm and normal heart sounds. Exam reveals no gallop and no friction rub.  No murmur heard. Pulmonary/Chest: Effort normal and breath sounds normal. No respiratory distress. She has no wheezes. She has no rales. She exhibits no tenderness.  Abdominal: Soft. There is no tenderness. There is no guarding.  Musculoskeletal: Normal range of motion.  Lymphadenopathy:    She has no cervical adenopathy.  Neurological: She is alert and oriented to person, place, and time. No cranial nerve deficit.  Skin: Skin is warm and dry. She is not diaphoretic.  Psychiatric: She has a normal mood and affect. Her behavior is normal. Judgment and thought content normal.  Nursing note and vitals reviewed.   Assessment/Plan: 1. Uncontrolled type 2 diabetes mellitus with hyperglycemia (HCC) A1c 6.0  today.  Will follow.  - POCT HgB A1C  2. Elevated BP without diagnosis of hypertension Pt elevated today, if elevated at next visit will discuss medications.  Pt has strong family history of HTN.  3. Acquired hypothyroidism Will follow up with labs. - levothyroxine (SYNTHROID, LEVOTHROID) 25 MCG tablet; Take 1 tablet (25 mcg total) by mouth daily before breakfast.  Dispense: 30 tablet; Refill: 3  4. Fatigue, unspecified type Will follow labs results.  - TSH + free T4 - Fe+TIBC+Fer - B12 and Folate Panel  5. Abnormal weight gain Pt has gained 4 pounds in six months.   6. DDD (degenerative disc disease), lumbar Try tramadol for Low back pain.  Discussed caution with driving and somnolence.  - traMADol (ULTRAM) 50 MG tablet; Take 1 tablet (50 mg total) by mouth 2 (two) times daily  as needed.  Dispense: 14 tablet; Refill: 0  General Counseling: Gladine verbalizes understanding of the findings of todays visit and agrees with plan of treatment. I have discussed any further diagnostic evaluation that may be needed or ordered today. We also reviewed her medications today. she has been encouraged to call the office with any questions or concerns that should arise related to todays visit.    Orders Placed This Encounter  Procedures  . TSH + free T4  . Fe+TIBC+Fer  . B12 and Folate Panel  . POCT HgB A1C    Meds ordered this encounter  Medications  . levothyroxine (SYNTHROID, LEVOTHROID) 25 MCG tablet    Sig: Take 1 tablet (25 mcg total) by mouth daily before breakfast.    Dispense:  30 tablet    Refill:  3  . traMADol (ULTRAM) 50 MG tablet    Sig: Take 1 tablet (50 mg total) by mouth 2 (two) times daily as needed.    Dispense:  14 tablet    Refill:  0    Time spent: 25 Minutes   This patient was seen by Orson Gear AGNP-C in Collaboration with Dr Lavera Guise as a part of collaborative care agreement    Dr Lavera Guise Internal medicine

## 2018-06-06 ENCOUNTER — Other Ambulatory Visit: Payer: Self-pay | Admitting: Adult Health

## 2018-06-06 DIAGNOSIS — R5383 Other fatigue: Secondary | ICD-10-CM | POA: Diagnosis not present

## 2018-06-07 DIAGNOSIS — F39 Unspecified mood [affective] disorder: Secondary | ICD-10-CM | POA: Insufficient documentation

## 2018-06-07 DIAGNOSIS — F411 Generalized anxiety disorder: Secondary | ICD-10-CM

## 2018-06-07 LAB — IRON,TIBC AND FERRITIN PANEL
Ferritin: 11 ng/mL — ABNORMAL LOW (ref 15–150)
IRON SATURATION: 37 % (ref 15–55)
Iron: 136 ug/dL (ref 27–159)
TIBC: 372 ug/dL (ref 250–450)
UIBC: 236 ug/dL (ref 131–425)

## 2018-06-07 LAB — B12 AND FOLATE PANEL
Folate: 4.9 ng/mL (ref >3.0)
Vitamin B-12: 345 pg/mL (ref 232–1245)

## 2018-06-07 LAB — TSH+FREE T4
FREE T4: 1.01 ng/dL (ref 0.82–1.77)
TSH: 1.88 u[IU]/mL (ref 0.450–4.500)

## 2018-06-19 ENCOUNTER — Ambulatory Visit (INDEPENDENT_AMBULATORY_CARE_PROVIDER_SITE_OTHER): Payer: 59 | Admitting: Psychiatry

## 2018-06-19 ENCOUNTER — Encounter: Payer: Self-pay | Admitting: Psychiatry

## 2018-06-19 VITALS — BP 110/69 | HR 73

## 2018-06-19 DIAGNOSIS — F411 Generalized anxiety disorder: Secondary | ICD-10-CM | POA: Diagnosis not present

## 2018-06-19 DIAGNOSIS — F5101 Primary insomnia: Secondary | ICD-10-CM

## 2018-06-19 DIAGNOSIS — F39 Unspecified mood [affective] disorder: Secondary | ICD-10-CM

## 2018-06-19 DIAGNOSIS — R69 Illness, unspecified: Secondary | ICD-10-CM | POA: Diagnosis not present

## 2018-06-19 MED ORDER — HYDROXYZINE PAMOATE 50 MG PO CAPS: 50.0000 mg | ORAL_CAPSULE | Freq: Three times a day (TID) | ORAL | 1 refills | Status: DC | PRN

## 2018-06-19 MED ORDER — ALPRAZOLAM 0.25 MG PO TABS: 0.2500 mg | ORAL_TABLET | Freq: Two times a day (BID) | ORAL | 1 refills | Status: DC | PRN

## 2018-06-19 MED ORDER — DESVENLAFAXINE SUCCINATE ER 100 MG PO TB24: 100.0000 mg | ORAL_TABLET | ORAL | 1 refills | Status: DC

## 2018-06-19 MED ORDER — LAMOTRIGINE 150 MG PO TABS: 150.0000 mg | ORAL_TABLET | Freq: Two times a day (BID) | ORAL | 0 refills | Status: DC

## 2018-06-19 MED ORDER — DESVENLAFAXINE SUCCINATE ER 50 MG PO TB24: 50.0000 mg | ORAL_TABLET | Freq: Every evening | ORAL | 1 refills | Status: DC

## 2018-06-19 MED ORDER — PROPRANOLOL HCL 20 MG PO TABS: 20.0000 mg | ORAL_TABLET | Freq: Two times a day (BID) | ORAL | 1 refills | Status: DC | PRN

## 2018-06-19 NOTE — Progress Notes (Signed)
Ashley Stephens [id number] [birth year] 42 y.o.  Subjective:   Patient ID:  Ashley Stephens is a 12 y.o. (DOB 09-14-1975) female.  Chief Complaint:  Chief Complaint  Patient presents with  . Depression  . Anxiety  . Sleeping Problem    HPI Ashley Stephens presents to the office today for follow-up of mood and anxiety. She reports "I don't think the Lithium is for me." She reports feeling "different" in a negative way. Reports that she stopped taking it regularly after a week. Reports mood is "better than it was the last time I saw you, but I still have my moments." For example, she reports panic and severe irritability when she was unable to sign in to work remotely from home when she did not realize there was a system wide technical issue. She reports depression about 75% of the time, where as it was 95-100% at last visit. She reports frequent irritability and that Propranolol prn is effective for her irritability and anxiety. Had severe anxiety when she had to work in the office two days. Reports having panic attacks on one of the days she went into work. Reports frequent worry and anxious thoughts. Reports that it takes awhile for her to become sleepy and fall asleep and will sleep a total of 4-5 hours a night. She reports that her appetite "depends." She reports that her energy was very low- "zapped" during leave of absence from work and slept frequently. She reports that her energy has improved somewhat but remains very low. Motivation "comes and goes." Reports difficulty being able to focus while working and reports that concentration has been worse. Reports chronic suicidal thoughts are "normal, not that high" and occurs when she feels very overwhelmed by acute stressors. Denies current SI. Denies any impulsive or risky behaviors. Reports frequent mood lability.   Reports that she had an appointment with Rosary Lively, [facility] and it was cancelled by provider.   Medications:  I have reviewed the patient's current medications.  Medication Side Effects: Other: Reports feeling "different" with Lithium  Allergies: No Known Allergies  Past Medical History:  Diagnosis Date  . Anxiety   . Depression   . Gastric ulcer   . GERD (gastroesophageal reflux disease)   . Hypertension   . Thyroid disorder     Family History  Problem Relation Age of Onset  . Breast cancer Maternal Grandmother     Social History   Socioeconomic History  . Marital status: Single    Spouse name: Not on file  . Number of children: Not on file  . Years of education: Not on file  . Highest education level: Not on file  Occupational History  . Not on file  Social Needs  . Financial resource strain: Not on file  . Food insecurity:    Worry: Not on file    Inability: Not on file  . Transportation needs:    Medical: Not on file    Non-medical: Not on file  Tobacco Use  . Smoking status: Never Smoker  . Smokeless tobacco: Never Used  Substance and Sexual Activity  . Alcohol use: No    Alcohol/week: 0.0 standard drinks  . Drug use: No  . Sexual activity: Yes    Birth control/protection: Surgical  Lifestyle  . Physical activity:    Days per week: Not on file    Minutes per session: Not on file  . Stress: Not on file  Relationships  . Social connections:  Talks on phone: Not on file    Gets together: Not on file    Attends religious service: Not on file    Active member of club or organization: Not on file    Attends meetings of clubs or organizations: Not on file    Relationship status: Not on file  . Intimate partner violence:    Fear of current or ex partner: Not on file    Emotionally abused: Not on file    Physically abused: Not on file    Forced sexual activity: Not on file  Other Topics Concern  . Not on file  Social History Narrative  . Not on file    Past Medical History, Surgical history, Social history, and Family history were reviewed and updated as  appropriate.   Please see review of systems for further details on the patient's review from today.   Review of Systems:  Review of Systems  Constitutional: Positive for fatigue.  Gastrointestinal: Positive for constipation.  Musculoskeletal: Positive for back pain.  Psychiatric/Behavioral: Positive for dysphoric mood and sleep disturbance. The patient is nervous/anxious.     Objective:   Physical Exam:  BP 110/69   Pulse 73   Physical Exam  Constitutional: She is oriented to person, place, and time. She appears well-developed. No distress.  Musculoskeletal: Normal range of motion.  Neurological: She is alert and oriented to person, place, and time. Coordination normal.  Psychiatric: Her speech is normal and behavior is normal. Judgment and thought content normal. Her mood appears anxious. Her affect is not angry, not blunt, not labile and not inappropriate. Cognition and memory are normal. She exhibits a depressed mood. She expresses no homicidal and no suicidal ideation. She expresses no suicidal plans and no homicidal plans.    Lab Review:     Component Value Date/Time   NA 137 02/05/2018 1045   NA 138 12/27/2017 0740   NA 142 12/03/2011 1059   K 3.7 02/05/2018 1045   K 3.4 (L) 12/03/2011 1059   CL 104 02/05/2018 1045   CL 107 12/03/2011 1059   CO2 26 02/05/2018 1045   CO2 27 12/03/2011 1059   GLUCOSE 105 (H) 02/05/2018 1045   GLUCOSE 66 12/03/2011 1059   BUN 8 02/05/2018 1045   BUN 10 12/27/2017 0740   BUN 4 (L) 12/03/2011 1059   CREATININE 0.79 02/05/2018 1045   CREATININE 0.82 12/03/2011 1059   CALCIUM 9.0 02/05/2018 1045   CALCIUM 8.5 12/03/2011 1059   PROT 6.7 02/05/2018 1045   PROT 6.7 12/27/2017 0740   ALBUMIN 3.7 02/05/2018 1045   ALBUMIN 4.2 12/27/2017 0740   AST 19 02/05/2018 1045   ALT 16 02/05/2018 1045   ALKPHOS 61 02/05/2018 1045   BILITOT 0.2 (L) 02/05/2018 1045   BILITOT 0.2 12/27/2017 0740   GFRNONAA >60 02/05/2018 1045   GFRNONAA >60  12/03/2011 1059   GFRAA >60 02/05/2018 1045   GFRAA >60 12/03/2011 1059       Component Value Date/Time   WBC 4.7 02/05/2018 1045   RBC 4.65 02/05/2018 1045   HGB 13.4 02/05/2018 1045   HGB 13.9 12/27/2017 0740   HCT 39.5 02/05/2018 1045   HCT 40.9 12/27/2017 0740   PLT 294 02/05/2018 1045   PLT 331 12/27/2017 0740   MCV 84.9 02/05/2018 1045   MCV 85 12/27/2017 0740   MCH 28.8 02/05/2018 1045   MCHC 33.9 02/05/2018 1045   RDW 12.7 02/05/2018 1045   RDW 13.3 12/27/2017 0740  LYMPHSABS 1.4 02/05/2018 1045   LYMPHSABS 1.8 12/27/2017 0740   MONOABS 0.4 02/05/2018 1045   EOSABS 0.1 02/05/2018 1045   EOSABS 0.1 12/27/2017 0740   BASOSABS 0.0 02/05/2018 1045   BASOSABS 0.0 12/27/2017 0740    No results found for: POCLITH, LITHIUM   No results found for: PHENYTOIN, PHENOBARB, VALPROATE, CBMZ   .res Assessment: Plan:   Patient seen for 30 minutes and greater than 50% of visit spent counseling patient regarding treatment options to include potential benefits, risk, and side effects of increasing lamotrigine to a total of 300 mg daily since this has been 1 of the most effective medications for her mood.  Discussed option of obtaining a lamotrigine level to determine if drug level is within therapeutic range.  Discussed that she should continue to monitor for signs and symptoms of possible rash and to contact clinic immediately if she were to develop a rash.  Discussed that she should also avoid missing multiple consecutive days of Lamictal and if she were ever without Lamictal longer than several days she should contact office prior to restarting Lamictal. Generalized anxiety disorder - Plan: ALPRAZolam (XANAX) 0.25 MG tablet, desvenlafaxine (PRISTIQ) 100 MG 24 hr tablet, desvenlafaxine (PRISTIQ) 50 MG 24 hr tablet, propranolol (INDERAL) 20 MG tablet  Mood disorder (HCC) - Plan: lamoTRIgine (LAMICTAL) 150 MG tablet, desvenlafaxine (PRISTIQ) 100 MG 24 hr tablet, desvenlafaxine (PRISTIQ)  50 MG 24 hr tablet  Primary insomnia - Plan: hydrOXYzine (VISTARIL) 50 MG capsule  Please see After Visit Summary for patient specific instructions.  Future Appointments  Date Time Provider [place]  06/20/2018 10:30 AM Kendell Bane, NP [facility] None  07/25/2018  9:30 AM [name], PMHNP [facility] None  08/22/2018  9:45 AM Versie Starks, Audie Clear, NP [facility] None    No orders of the defined types were placed in this encounter.     -------------------------------

## 2018-06-20 ENCOUNTER — Encounter: Payer: Self-pay | Admitting: Adult Health

## 2018-06-20 ENCOUNTER — Other Ambulatory Visit: Payer: Self-pay

## 2018-06-20 ENCOUNTER — Ambulatory Visit (INDEPENDENT_AMBULATORY_CARE_PROVIDER_SITE_OTHER): Payer: 59 | Admitting: Adult Health

## 2018-06-20 VITALS — BP 132/78 | HR 84 | Resp 16 | Ht 67.0 in | Wt 258.0 lb

## 2018-06-20 DIAGNOSIS — Z6841 Body Mass Index (BMI) 40.0 and over, adult: Secondary | ICD-10-CM | POA: Diagnosis not present

## 2018-06-20 DIAGNOSIS — E1165 Type 2 diabetes mellitus with hyperglycemia: Secondary | ICD-10-CM | POA: Diagnosis not present

## 2018-06-20 DIAGNOSIS — R030 Elevated blood-pressure reading, without diagnosis of hypertension: Secondary | ICD-10-CM | POA: Diagnosis not present

## 2018-06-20 DIAGNOSIS — R635 Abnormal weight gain: Secondary | ICD-10-CM | POA: Diagnosis not present

## 2018-06-20 DIAGNOSIS — R5383 Other fatigue: Secondary | ICD-10-CM | POA: Diagnosis not present

## 2018-06-20 DIAGNOSIS — R300 Dysuria: Secondary | ICD-10-CM

## 2018-06-20 DIAGNOSIS — F411 Generalized anxiety disorder: Secondary | ICD-10-CM

## 2018-06-20 DIAGNOSIS — Z0001 Encounter for general adult medical examination with abnormal findings: Secondary | ICD-10-CM

## 2018-06-20 DIAGNOSIS — R69 Illness, unspecified: Secondary | ICD-10-CM | POA: Diagnosis not present

## 2018-06-20 MED ORDER — OMEPRAZOLE 20 MG PO CPDR: 20.0000 mg | DELAYED_RELEASE_CAPSULE | Freq: Two times a day (BID) | ORAL | 0 refills | Status: DC

## 2018-06-20 NOTE — Progress Notes (Signed)
[facility] [place], [place] [address]  Internal MEDICINE  Office Visit Note  Patient Name: Ashley Stephens  [id number]  [id number]  Date of Service: 06/20/2018  Chief Complaint  Patient presents with  . Annual Exam  . Hypothyroidism  . Depression  . Gastroesophageal Reflux     HPI Pt is here for routine health maintenance examination.  She is a well-appearing 42 year old African-American female.  She is a history of hypothyroidism, depression, and GERD.  Her biggest concern at today's visit is that she has gained another pound since her last visit.  She is starting an exercise and diet point this week, she would like to discuss the possibility of appetite suppressant in the future.  She had a gastric bypass surgery a few years ago and has slowly started to gain weight.  Her thyroid is well controlled at this time, she denies any issues with her depression.  Her GERD is also well controlled.  She denies tobacco, alcohol, or illicit drug use.  She is up-to-date on her preventive health maintenance.  Current Medication: Outpatient Encounter Medications as of 06/20/2018  Medication Sig  . [START ON 07/09/2018] ALPRAZolam (XANAX) 0.25 MG tablet Take 1 tablet (0.25 mg total) by mouth 2 (two) times daily as needed for anxiety.  Marland Kitchen desvenlafaxine (PRISTIQ) 100 MG 24 hr tablet Take 1 tablet (100 mg total) by mouth every morning.  . desvenlafaxine (PRISTIQ) 50 MG 24 hr tablet Take 1 tablet (50 mg total) by mouth every evening.  . hydrOXYzine (VISTARIL) 50 MG capsule Take 1 capsule (50 mg total) by mouth 3 (three) times daily as needed.  . lamoTRIgine (LAMICTAL) 150 MG tablet Take 1 tablet (150 mg total) by mouth 2 (two) times daily.  Marland Kitchen levothyroxine (SYNTHROID, LEVOTHROID) 25 MCG tablet Take 1 tablet (25 mcg total) by mouth daily before breakfast.  . omeprazole (PRILOSEC) 20 MG capsule Take 1 capsule (20 mg total) by mouth 2 (two) times daily before a meal.  .  propranolol (INDERAL) 20 MG tablet Take 1 tablet (20 mg total) by mouth 2 (two) times daily as needed.  . [DISCONTINUED] omeprazole (PRILOSEC) 20 MG capsule Take 1 capsule (20 mg total) by mouth 2 (two) times daily before a meal.  . [DISCONTINUED] famotidine (PEPCID) 20 MG tablet Take 1 tablet (20 mg total) by mouth 2 (two) times daily. (Patient not taking: Reported on 06/19/2018)  . [DISCONTINUED] lithium carbonate 150 MG capsule Take 150 mg by mouth at bedtime. Po qhs x 3-5 days  . [DISCONTINUED] Lurasidone HCl (LATUDA) 60 MG TABS Take by mouth.  . [DISCONTINUED] Lurasidone HCl (LATUDA) 60 MG TABS Take 60 mg by mouth daily.  . [DISCONTINUED] naproxen (NAPROSYN) 500 MG tablet Take 1 tablet (500 mg total) by mouth 2 (two) times daily. (Patient not taking: Reported on 06/19/2018)  . [DISCONTINUED] Sucralfate (CARAFATE PO) Take by mouth.  . [DISCONTINUED] traMADol (ULTRAM) 50 MG tablet Take 1 tablet (50 mg total) by mouth 2 (two) times daily as needed. (Patient not taking: Reported on 06/19/2018)   No facility-administered encounter medications on file as of 06/20/2018.     Surgical History: Past Surgical History:  Procedure Laterality Date  . ABDOMINAL HYSTERECTOMY  2013  . BARIATRIC SURGERY    . BREAST BIOPSY  1995  . GASTRIC BYPASS  2012  . TUBAL LIGATION  1996    Medical History: Past Medical History:  Diagnosis Date  . Anxiety   . Depression   . Gastric ulcer   .  GERD (gastroesophageal reflux disease)   . Hypertension   . Thyroid disorder     Family History: Family History  Problem Relation Age of Onset  . Breast cancer Maternal Grandmother       Review of Systems  Constitutional: Negative for chills, fatigue and unexpected weight change.  HENT: Negative for congestion, rhinorrhea, sneezing and sore throat.   Eyes: Negative for photophobia, pain and redness.  Respiratory: Negative for cough, chest tightness and shortness of breath.   Cardiovascular: Negative for  chest pain and palpitations.  Gastrointestinal: Negative for abdominal pain, constipation, diarrhea, nausea and vomiting.  Endocrine: Negative.   Genitourinary: Negative for dysuria and frequency.  Musculoskeletal: Negative for arthralgias, back pain, joint swelling and neck pain.  Skin: Negative for rash.  Allergic/Immunologic: Negative.   Neurological: Negative for tremors and numbness.  Hematological: Negative for adenopathy. Does not bruise/bleed easily.  Psychiatric/Behavioral: Negative for behavioral problems and sleep disturbance. The patient is not nervous/anxious.      Vital Signs: BP 132/78   Pulse 84   Resp 16   Ht 5\' 7"  (1.702 m)   Wt 258 lb (117 kg)   SpO2 97%   BMI 40.41 kg/m    Physical Exam  Constitutional: She is oriented to person, place, and time. She appears well-developed and well-nourished. No distress.  HENT:  Head: Normocephalic and atraumatic.  Mouth/Throat: Oropharynx is clear and moist. No oropharyngeal exudate.  Eyes: Pupils are equal, round, and reactive to light. EOM are normal.  Neck: Normal range of motion. Neck supple. No JVD present. No tracheal deviation present. No thyromegaly present.  Cardiovascular: Normal rate, regular rhythm and normal heart sounds. Exam reveals no gallop and no friction rub.  No murmur heard. Pulmonary/Chest: Effort normal and breath sounds normal. No respiratory distress. She has no wheezes. She has no rales. She exhibits no tenderness. No breast swelling, tenderness, discharge or bleeding. Breasts are symmetrical.  Exam Chaperoned by Dorna Bloom CMA  Abdominal: Soft. There is no tenderness. There is no guarding.  Musculoskeletal: Normal range of motion.  Lymphadenopathy:    She has no cervical adenopathy.  Neurological: She is alert and oriented to person, place, and time. No cranial nerve deficit.  Skin: Skin is warm and dry. She is not diaphoretic.  Psychiatric: She has a normal mood and affect. Her behavior is  normal. Judgment and thought content normal.  Nursing note and vitals reviewed.    LABS: Recent Results (from the past 2160 hour(s))  POCT HgB A1C     Status: Abnormal   Collection Time: 05/22/18  3:33 PM  Result Value Ref Range   Hemoglobin A1C 6.0 (A) 4.0 - 5.6 %   HbA1c POC (<> result, manual entry)     HbA1c, POC (prediabetic range)     HbA1c, POC (controlled diabetic range)    Iron, TIBC and Ferritin Panel     Status: Abnormal   Collection Time: 06/06/18 10:39 AM  Result Value Ref Range   Total Iron Binding Capacity 372 250 - 450 ug/dL   UIBC 236 131 - 425 ug/dL   Iron 136 27 - 159 ug/dL   Iron Saturation 37 15 - 55 %   Ferritin 11 (L) 15 - 150 ng/mL  TSH + free T4     Status: None   Collection Time: 06/06/18 10:39 AM  Result Value Ref Range   TSH 1.880 0.450 - 4.500 uIU/mL   Free T4 1.01 0.82 - 1.77 ng/dL  B12 and Folate Panel  Status: None   Collection Time: 06/06/18 10:39 AM  Result Value Ref Range   Vitamin B-12 345 232 - 1,245 pg/mL   Folate 4.9 >3.0 ng/mL    Comment: A serum folate concentration of less than 3.1 ng/mL is considered to represent clinical deficiency.     Assessment/Plan: 1. Encounter for general adult medical examination with abnormal findings Up-to-date on preventative health maintenance.  Diabetic foot exam provided at this visit.  2. Uncontrolled type 2 diabetes mellitus with hyperglycemia ([place]) Patient did have A1c rechecked in December.  Appears to be decently controlled at this time.  Most recent A1c was 6.0.  3. Elevated BP without diagnosis of hypertension Patient has significant familial cardiac history.  Multiple siblings both parents cousins, aunts, uncles, and grandparents all had significant cardiac disease.  She would like to see cardiology and obtain possibly a stress test.  She denies any recent chest pain or shortness of breath, however she has anxiety surrounding coronary artery disease in her family history. - Ambulatory  referral to Cardiology  4. Abnormal weight gain Patient continues to gain weight and we had an extensive discussion at this visit about diet and exercise. Marland Kitchenajs  5. Generalized anxiety disorder Her anxiety is well controlled at this time, continue current medication therapy as prescribed.  6. Fatigue, unspecified type Fatigue symptoms appear to be mild as of now.  Patient reports she has started going to the gym and plans to increase frequency this at this time.  Her fatigue lab work-up was benign save for a low ferritin level.  We discussed over-the-counter iron supplementation.  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  7. Dysuria - UA/M w/rflx Culture, Routine  8. Morbid obesity with BMI of 40.0-44.9, adult (HCC) Obesity Counseling: Risk Assessment: An assessment of behavioral risk factors was made today and includes lack of exercise sedentary lifestyle, lack of portion control and poor dietary habits.  Risk Modification Advice: She was counseled on portion control guidelines. Restricting daily caloric intake to. . The detrimental long term effects of obesity on her health and ongoing poor compliance was also discussed with the patient.   General Counseling: Shanera verbalizes understanding of the findings of todays visit and agrees with plan of treatment. I have discussed any further diagnostic evaluation that may be needed or ordered today. We also reviewed her medications today. she has been encouraged to call the office with any questions or concerns that should arise related to todays visit.   Orders Placed This Encounter  Procedures  . UA/M w/rflx Culture, Routine  . Ambulatory referral to Cardiology    Meds ordered this encounter  Medications  . omeprazole (PRILOSEC) 20 MG capsule    Sig: Take 1 capsule (20 mg total) by mouth 2 (two) times daily before a meal.    Dispense:  60 capsule    Refill:  0    Time  spent: 30 Minutes   This patient was seen by Orson Gear AGNP-C in Collaboration with Dr Lavera Guise as a part of collaborative care agreement    Kendell Bane AGNP-C Internal Medicine

## 2018-06-20 NOTE — Patient Instructions (Signed)

## 2018-06-21 LAB — UA/M W/RFLX CULTURE, ROUTINE
Bilirubin, UA: NEGATIVE
GLUCOSE, UA: NEGATIVE
Leukocytes, UA: NEGATIVE
Nitrite, UA: NEGATIVE
Protein, UA: NEGATIVE
RBC, UA: NEGATIVE
Specific Gravity, UA: 1.02 (ref 1.005–1.030)
UUROB: 0.2 mg/dL (ref 0.2–1.0)
pH, UA: 5 (ref 5.0–7.5)

## 2018-06-21 LAB — MICROSCOPIC EXAMINATION: CASTS: NONE SEEN /LPF

## 2018-06-30 ENCOUNTER — Emergency Department (HOSPITAL_BASED_OUTPATIENT_CLINIC_OR_DEPARTMENT_OTHER)
Admission: EM | Admit: 2018-06-30 | Discharge: 2018-06-30 | Disposition: A | Discharge status: Home / Self Care | Payer: 59 | Attending: Emergency Medicine | Admitting: Emergency Medicine

## 2018-06-30 ENCOUNTER — Other Ambulatory Visit: Payer: Self-pay

## 2018-06-30 ENCOUNTER — Encounter (HOSPITAL_BASED_OUTPATIENT_CLINIC_OR_DEPARTMENT_OTHER): Payer: Self-pay | Admitting: *Deleted

## 2018-06-30 DIAGNOSIS — K279 Peptic ulcer, site unspecified, unspecified as acute or chronic, without hemorrhage or perforation: Secondary | ICD-10-CM | POA: Diagnosis not present

## 2018-06-30 DIAGNOSIS — Z8711 Personal history of peptic ulcer disease: Secondary | ICD-10-CM | POA: Insufficient documentation

## 2018-06-30 DIAGNOSIS — J029 Acute pharyngitis, unspecified: Secondary | ICD-10-CM | POA: Insufficient documentation

## 2018-06-30 DIAGNOSIS — J028 Acute pharyngitis due to other specified organisms: Secondary | ICD-10-CM | POA: Diagnosis not present

## 2018-06-30 LAB — GROUP A STREP BY PCR: GROUP A STREP BY PCR: NOT DETECTED

## 2018-06-30 MED ORDER — DICYCLOMINE HCL 10 MG/5ML PO SOLN
10.0000 mg | Freq: Once | ORAL | Status: DC
  Filled 2018-06-30: qty 5

## 2018-06-30 MED ORDER — ALUM & MAG HYDROXIDE-SIMETH 200-200-20 MG/5ML PO SUSP
15.0000 mL | Freq: Once | ORAL | Status: AC
  Administered 2018-06-30: 15 mL via ORAL
  Filled 2018-06-30: qty 30

## 2018-06-30 MED ORDER — DICYCLOMINE HCL 10 MG PO CAPS
ORAL_CAPSULE | ORAL | Status: AC
  Filled 2018-06-30: qty 1

## 2018-06-30 MED ORDER — LIDOCAINE VISCOUS HCL 2 % MT SOLN
15.0000 mL | Freq: Once | OROMUCOSAL | Status: AC
  Administered 2018-06-30: 15 mL via ORAL
  Filled 2018-06-30: qty 15

## 2018-06-30 MED ORDER — DICYCLOMINE HCL 10 MG PO CAPS
10.0000 mg | ORAL_CAPSULE | Freq: Once | ORAL | Status: AC
  Administered 2018-06-30: 10 mg via ORAL

## 2018-06-30 NOTE — ED Notes (Signed)
ED Provider at bedside. 

## 2018-06-30 NOTE — Discharge Instructions (Addendum)
Contact a health care provider if: Your symptoms do not improve within 7 days of starting treatment. You have ongoing indigestion or heartburn. Get help right away if: You have sudden, sharp, or persistent pain in your abdomen. You have bloody or dark black, tarry stools. You vomit blood or material that looks like coffee grounds. You become light-headed or you feel faint. You become weak. You become sweaty or clammy. You have difficulty breathing. You cannot swallow fluids, soft foods, or your saliva. You have increased swelling in your throat or neck. You have persistent nausea and vomiting.

## 2018-06-30 NOTE — ED Provider Notes (Signed)
[facility] EMERGENCY DEPARTMENT Provider Note   CSN: [id number] Arrival date & time: 06/30/18  1503     History   Chief Complaint Chief Complaint  Patient presents with  . Sore Throat    HPI Ashley Stephens is a 42 y.o. female.  She presents the emergency department with chief complaint of sore throat and epigastric pain.  She is status post gastric bypass surgery.  Earlier this year she began developing epigastric pain and was found to have anastomotic ulceration after upper endoscopy.  Patient has been taking Carafate and Prilosec.  She states that today she is having significant burning pain that has been unable to quell the burning in her abdomen.  She denies vomiting, hematemesis, fevers, chills, diarrhea or constipation.  She also complains of sore throat that started 3 days ago.  She has been gargling with warm salt water without significant relief.  She states she thinks she has strep or postnasal drip.  She has associated nasal congestion and tender cervical lymphadenopathy that is painful with swallowing.  She denies changes in voice, fevers or chills, inability to tolerate swallowing liquids.  HPI  Past Medical History:  Diagnosis Date  . Anxiety   . Depression   . Gastric ulcer   . GERD (gastroesophageal reflux disease)   . Hypertension   . Thyroid disorder     Patient Active Problem List   Diagnosis Date Noted  . Mild mood disorder ([place]) 06/07/2018  . Generalized anxiety disorder 06/07/2018  . Uncontrolled type 2 diabetes mellitus with hypoglycemia ([place]) 12/14/2017  . Impaired fasting glucose 12/14/2017  . Fatigue 12/14/2017  . Abnormal weight gain 12/14/2017  . Vitamin D deficiency 12/14/2017  . Acquired hypothyroidism 12/14/2017    Past Surgical History:  Procedure Laterality Date  . ABDOMINAL HYSTERECTOMY  2013  . BARIATRIC SURGERY    . BREAST BIOPSY  1995  . GASTRIC BYPASS  2012  . TUBAL LIGATION  1996     OB History    Gravida   2   Para  2   Term      Preterm      AB      Living  2     SAB      TAB      Ectopic      Multiple      Live Births           Obstetric Comments  1st Menstrual Cycle:  10 1st Pregnancy:  36         Home Medications    Prior to Admission medications   Medication Sig Start Date End Date Taking? Authorizing Provider  ALPRAZolam (XANAX) 0.25 MG tablet Take 1 tablet (0.25 mg total) by mouth 2 (two) times daily as needed for anxiety. 07/09/18 08/08/18  [name], PMHNP  desvenlafaxine (PRISTIQ) 100 MG 24 hr tablet Take 1 tablet (100 mg total) by mouth every morning. 06/19/18   [name], PMHNP  desvenlafaxine (PRISTIQ) 50 MG 24 hr tablet Take 1 tablet (50 mg total) by mouth every evening. 06/19/18   [name], PMHNP  hydrOXYzine (VISTARIL) 50 MG capsule Take 1 capsule (50 mg total) by mouth 3 (three) times daily as needed. 06/19/18   [name], PMHNP  lamoTRIgine (LAMICTAL) 150 MG tablet Take 1 tablet (150 mg total) by mouth 2 (two) times daily. 06/19/18   [name], PMHNP  levothyroxine (SYNTHROID, LEVOTHROID) 25 MCG tablet Take 1 tablet (25 mcg total) by mouth daily before  breakfast. 05/22/18   Kendell Bane, NP  omeprazole (PRILOSEC) 20 MG capsule Take 1 capsule (20 mg total) by mouth 2 (two) times daily before a meal. 06/20/18   [place], Audie Clear, NP  propranolol (INDERAL) 20 MG tablet Take 1 tablet (20 mg total) by mouth 2 (two) times daily as needed. 06/19/18   [name], PMHNP    Family History Family History  Problem Relation Age of Onset  . Breast cancer Maternal Grandmother     Social History Social History   Tobacco Use  . Smoking status: Never Smoker  . Smokeless tobacco: Never Used  Substance Use Topics  . Alcohol use: No    Alcohol/week: 0.0 standard drinks  . Drug use: No     Allergies   Patient has no known allergies.   Review of Systems Review of Systems  Ten systems reviewed and are negative for  acute change, except as noted in the HPI.   Physical Exam Updated Vital Signs BP 122/81 (BP Location: Right Arm)   Pulse 82   Temp 99.1 F (37.3 C) (Oral)   Resp 16   Ht 5\' 7"  (1.702 m)   Wt 113.4 kg   SpO2 100%   BMI 39.16 kg/m   Physical Exam  Constitutional: She is oriented to person, place, and time. She appears well-developed and well-nourished. No distress.  HENT:  Head: Normocephalic and atraumatic.  Right Ear: Hearing, tympanic membrane and ear canal normal. No drainage.  Left Ear: Hearing, tympanic membrane and ear canal normal. No drainage.  Mouth/Throat: Uvula is midline, oropharynx is clear and moist and mucous membranes are normal. No oral lesions. No uvula swelling. No oropharyngeal exudate, posterior oropharyngeal edema, posterior oropharyngeal erythema or tonsillar abscesses.  Eyes: Pupils are equal, round, and reactive to light. Conjunctivae and EOM are normal. No scleral icterus.  Neck: Normal range of motion. Neck supple.  Cardiovascular: Normal rate, regular rhythm and normal heart sounds. Exam reveals no gallop and no friction rub.  No murmur heard. Pulmonary/Chest: Effort normal and breath sounds normal. No respiratory distress.  Abdominal: Soft. Bowel sounds are normal. She exhibits no distension and no mass. There is tenderness (Epigastrium). There is no guarding.  Lymphadenopathy:    She has cervical adenopathy.  Neurological: She is alert and oriented to person, place, and time.  Skin: Skin is warm and dry. She is not diaphoretic.  Psychiatric: Her behavior is normal.  Nursing note and vitals reviewed.    ED Treatments / Results  Labs (all labs ordered are listed, but only abnormal results are displayed) Labs Reviewed  GROUP A STREP BY PCR    EKG None  Radiology No results found.  Procedures Procedures (including critical care time)  Medications Ordered in ED Medications  alum & mag hydroxide-simeth (MAALOX/MYLANTA) 200-200-20 MG/5ML  suspension 15 mL (15 mLs Oral Given 06/30/18 1654)    And  lidocaine (XYLOCAINE) 2 % viscous mouth solution 15 mL (15 mLs Oral Given 06/30/18 1655)  dicyclomine (BENTYL) capsule 10 mg (10 mg Oral Given 06/30/18 1658)     Initial Impression / Assessment and Plan / ED Course  I have reviewed the triage vital signs and the nursing notes.  Pertinent labs & imaging results that were available during my care of the patient were reviewed by me and considered in my medical decision making (see chart for details).     Patient with history of peptic ulcer disease.  She was given a GI cocktail with complete resolution of  her burning epigastric abdominal pain and her sore throat pain.  Patient's GI specialist was in [place] and she does not live they are and would like someone local.  Given a referral to our GI specialist here.  She denies chest pain or shortness of breath and I have no suspicion for ACS as the cause of her symptoms which are chronic and unchanged.  Patient's strep test is negative and she appears to have a viral pharyngitis.  Suggest continuing with salt water gargles, throat lozenges, and other symptomatic treatment.  Patient appears appropriate for discharge at this time.  She should follow-up with her primary care physician.  Final Clinical Impressions(s) / ED Diagnoses   Final diagnoses:  None    ED Discharge Orders    None       Margarita Mail, PA-C 06/30/18 1751    Isla Pence, MD 06/30/18 2327

## 2018-06-30 NOTE — ED Triage Notes (Signed)
Pt c/o sore throat x2 days 

## 2018-07-01 DIAGNOSIS — J069 Acute upper respiratory infection, unspecified: Secondary | ICD-10-CM | POA: Diagnosis not present

## 2018-07-06 ENCOUNTER — Encounter: Payer: Self-pay | Admitting: Emergency Medicine

## 2018-07-14 DIAGNOSIS — Z0289 Encounter for other administrative examinations: Secondary | ICD-10-CM

## 2018-07-25 ENCOUNTER — Encounter: Payer: Self-pay | Admitting: Psychiatry

## 2018-07-25 ENCOUNTER — Ambulatory Visit (INDEPENDENT_AMBULATORY_CARE_PROVIDER_SITE_OTHER): Payer: 59 | Admitting: Psychiatry

## 2018-07-25 VITALS — BP 111/69 | HR 84

## 2018-07-25 DIAGNOSIS — F411 Generalized anxiety disorder: Secondary | ICD-10-CM

## 2018-07-25 DIAGNOSIS — Z79899 Other long term (current) drug therapy: Secondary | ICD-10-CM | POA: Diagnosis not present

## 2018-07-25 DIAGNOSIS — F39 Unspecified mood [affective] disorder: Secondary | ICD-10-CM

## 2018-07-25 DIAGNOSIS — R69 Illness, unspecified: Secondary | ICD-10-CM | POA: Diagnosis not present

## 2018-07-25 MED ORDER — LAMOTRIGINE 150 MG PO TABS: 150.0000 mg | ORAL_TABLET | Freq: Two times a day (BID) | ORAL | 0 refills | Status: DC

## 2018-07-25 MED ORDER — ALPRAZOLAM 0.25 MG PO TABS: 0.2500 mg | ORAL_TABLET | Freq: Two times a day (BID) | ORAL | 3 refills | Status: DC | PRN

## 2018-07-25 MED ORDER — LURASIDONE HCL 20 MG PO TABS: ORAL_TABLET | ORAL | Status: DC

## 2018-07-25 NOTE — Progress Notes (Signed)
Ashley Stephens [id number] Jan 20, 1976 42 y.o.  Subjective:   Patient ID:  Ashley Stephens is a 66 y.o. (DOB October 01, 1975) female.  Chief Complaint:  Chief Complaint  Patient presents with  . Depression  . Anxiety  . Insomnia    HPI Ashley Stephens presents to the office today for follow-up of mood and anxiety. She reports that she continues to experience frequent mood and anxiety s/s. She reports that she has not had any acute exacerbations of mood and anxiety.  She reports periods of depression where mood is sad and has severely low energy, low motivation, decreased appetite, hopelessness, and passive death wishes with difficulty getting out of bed. She reports that these episodes can last up to 3-4 days and occur 2-3 times a month. She reports that she continues to have acute exacerbations of depression and that the remainder of the time "I'm still depressed" but that overall mood is not as low. She reports periods of irritability in response to work stress. She reports "anxiety has been a little bit better." Denies any recent panic attacks. Reports frequent worry, anxious thoughts, and feeling nervous.  She reports that hydroxyzine no longer seems to be effective for her sleep. She reports awakening every 2-3 hours and that she has had difficulty falling asleep and that she often does not fall asleep until 2-3 hours after desired bedtime. Estimates sleeping about 5 hours a night. She reports that she frequently feels tired and energy is low. Reports that energy is significantly lower after 2-3 pm. Denies any change in motivation and has to push herself to do what she has to do. She reports that she has been eating more and attributes this to being bored. She reports that concentration is ok during the morning and decreases as the day progresses.   Denies any SI since last visit.   She reports that timing of her lunch break has been inconsistent and that this has caused  some anxiety and worsening depression.  Patient reports that having regular lunch break would be helpful to establish regular routine, predictability, and since of some control which would have a positive effect on her mood and anxiety.  Past medication trials: Wellbutrin Effexor Pristiq-effective Lamictal-effective for mood Latuda Xanax Propanolol Hydroxyzine Rexulti  Review of Systems:  Review of Systems  Gastrointestinal:       Recent heart burn  Musculoskeletal: Positive for back pain. Negative for gait problem.  Neurological: Negative for tremors.  Psychiatric/Behavioral:       Please refer to HPI    Medications: I have reviewed the patient's current medications.  Current Outpatient Medications  Medication Sig Dispense Refill  . [START ON 09/03/2018] ALPRAZolam (XANAX) 0.25 MG tablet Take 1 tablet (0.25 mg total) by mouth 2 (two) times daily as needed for anxiety. 45 tablet 3  . desvenlafaxine (PRISTIQ) 100 MG 24 hr tablet Take 1 tablet (100 mg total) by mouth every morning. 90 tablet 1  . desvenlafaxine (PRISTIQ) 50 MG 24 hr tablet Take 1 tablet (50 mg total) by mouth every evening. 90 tablet 1  . hydrOXYzine (VISTARIL) 50 MG capsule Take 1 capsule (50 mg total) by mouth 3 (three) times daily as needed. 90 capsule 1  . lamoTRIgine (LAMICTAL) 150 MG tablet Take 1 tablet (150 mg total) by mouth 2 (two) times daily. 180 tablet 0  . levothyroxine (SYNTHROID, LEVOTHROID) 25 MCG tablet Take 1 tablet (25 mcg total) by mouth daily before breakfast. 30 tablet 3  . Lurasidone HCl (  LATUDA) 60 MG TABS Take 60 mg by mouth daily.    Marland Kitchen omeprazole (PRILOSEC) 20 MG capsule Take 1 capsule (20 mg total) by mouth 2 (two) times daily before a meal. 60 capsule 0  . propranolol (INDERAL) 20 MG tablet Take 1 tablet (20 mg total) by mouth 2 (two) times daily as needed. 180 tablet 1  . lurasidone (LATUDA) 20 MG TABS tablet Take 1 tab with Latuda 60 mg to equal total dose of 80 mg. Samples given. 60  tablet    No current facility-administered medications for this visit.     Medication Side Effects: None  Allergies: No Known Allergies  Past Medical History:  Diagnosis Date  . Anxiety   . Depression   . Gastric ulcer   . GERD (gastroesophageal reflux disease)   . Hypertension   . Thyroid disorder     Family History  Problem Relation Age of Onset  . Breast cancer Maternal Grandmother     Social History   Socioeconomic History  . Marital status: Single    Spouse name: Not on file  . Number of children: Not on file  . Years of education: Not on file  . Highest education level: Not on file  Occupational History  . Not on file  Social Needs  . Financial resource strain: Not on file  . Food insecurity:    Worry: Not on file    Inability: Not on file  . Transportation needs:    Medical: Not on file    Non-medical: Not on file  Tobacco Use  . Smoking status: Never Smoker  . Smokeless tobacco: Never Used  Substance and Sexual Activity  . Alcohol use: No    Alcohol/week: 0.0 standard drinks  . Drug use: No  . Sexual activity: Yes    Birth control/protection: Surgical  Lifestyle  . Physical activity:    Days per week: Not on file    Minutes per session: Not on file  . Stress: Not on file  Relationships  . Social connections:    Talks on phone: Not on file    Gets together: Not on file    Attends religious service: Not on file    Active member of club or organization: Not on file    Attends meetings of clubs or organizations: Not on file    Relationship status: Not on file  . Intimate partner violence:    Fear of current or ex partner: Not on file    Emotionally abused: Not on file    Physically abused: Not on file    Forced sexual activity: Not on file  Other Topics Concern  . Not on file  Social History Narrative  . Not on file    Past Medical History, Surgical history, Social history, and Family history were reviewed and updated as appropriate.    Please see review of systems for further details on the patient's review from today.   Objective:   Physical Exam:  BP 111/69   Pulse 84   Physical Exam  Constitutional: She is oriented to person, place, and time. She appears well-developed. No distress.  Musculoskeletal: She exhibits no deformity.  Neurological: She is alert and oriented to person, place, and time. Coordination normal.  Psychiatric: Her speech is normal and behavior is normal. Judgment and thought content normal. Her mood appears anxious. Her affect is not angry, not blunt, not labile and not inappropriate. Cognition and memory are normal. She exhibits a depressed mood. She expresses  no homicidal and no suicidal ideation. She expresses no suicidal plans and no homicidal plans.  Insight intact. No auditory or visual hallucinations. No delusions.     Lab Review:     Component Value Date/Time   NA 137 02/05/2018 1045   NA 138 12/27/2017 0740   NA 142 12/03/2011 1059   K 3.7 02/05/2018 1045   K 3.4 (L) 12/03/2011 1059   CL 104 02/05/2018 1045   CL 107 12/03/2011 1059   CO2 26 02/05/2018 1045   CO2 27 12/03/2011 1059   GLUCOSE 105 (H) 02/05/2018 1045   GLUCOSE 66 12/03/2011 1059   BUN 8 02/05/2018 1045   BUN 10 12/27/2017 0740   BUN 4 (L) 12/03/2011 1059   CREATININE 0.79 02/05/2018 1045   CREATININE 0.82 12/03/2011 1059   CALCIUM 9.0 02/05/2018 1045   CALCIUM 8.5 12/03/2011 1059   PROT 6.7 02/05/2018 1045   PROT 6.7 12/27/2017 0740   ALBUMIN 3.7 02/05/2018 1045   ALBUMIN 4.2 12/27/2017 0740   AST 19 02/05/2018 1045   ALT 16 02/05/2018 1045   ALKPHOS 61 02/05/2018 1045   BILITOT 0.2 (L) 02/05/2018 1045   BILITOT 0.2 12/27/2017 0740   GFRNONAA >60 02/05/2018 1045   GFRNONAA >60 12/03/2011 1059   GFRAA >60 02/05/2018 1045   GFRAA >60 12/03/2011 1059       Component Value Date/Time   WBC 4.7 02/05/2018 1045   RBC 4.65 02/05/2018 1045   HGB 13.4 02/05/2018 1045   HGB 13.9 12/27/2017 0740   HCT  39.5 02/05/2018 1045   HCT 40.9 12/27/2017 0740   PLT 294 02/05/2018 1045   PLT 331 12/27/2017 0740   MCV 84.9 02/05/2018 1045   MCV 85 12/27/2017 0740   MCH 28.8 02/05/2018 1045   MCHC 33.9 02/05/2018 1045   RDW 12.7 02/05/2018 1045   RDW 13.3 12/27/2017 0740   LYMPHSABS 1.4 02/05/2018 1045   LYMPHSABS 1.8 12/27/2017 0740   MONOABS 0.4 02/05/2018 1045   EOSABS 0.1 02/05/2018 1045   EOSABS 0.1 12/27/2017 0740   BASOSABS 0.0 02/05/2018 1045   BASOSABS 0.0 12/27/2017 0740    No results found for: POCLITH, LITHIUM   No results found for: PHENYTOIN, PHENOBARB, VALPROATE, CBMZ   .res Assessment: Plan:   Patient seen for 30 minutes and greater than 50% of visit spent counseling patient regarding treatment plan.  Agree with patient that scheduled, consistent lunch break with help establish routine and predictability throughout her day and would likely result in improved mood and anxiety.  Discussed potential benefits, risks, and side effects of increasing Latuda to 80 mg daily since patient continues to report some mood cycling.  Patient provided with samples of Latuda 20 mg to take in combination with 60 mg tabs to equal total dose of 80 mg daily.  Patient advised to contact office if 80 mg dose is more effective and then prescription for Latuda 80 mg daily can be sent to her pharmacy.  Also discussed obtaining a lamotrigine level to determine if further dose adjustment and lamotrigine may be beneficial since lamotrigine has been helpful for her mood signs and symptoms.  Recommend continuing therapy. Mood disorder ([place]) - Plan: lamoTRIgine (LAMICTAL) 150 MG tablet, lurasidone (LATUDA) 20 MG TABS tablet  Generalized anxiety disorder - Plan: ALPRAZolam (XANAX) 0.25 MG tablet  High risk medication use - Plan: Lamotrigine level  Please see After Visit Summary for patient specific instructions.  Future Appointments  Date Time Provider [place]  08/08/2018  8:30 AM Kendell Bane,  NP [facility] None  08/22/2018  9:45 AM Kendell Bane, NP [facility] None  08/22/2018  3:00 PM Wellington Hampshire, MD [facility]  09/12/2018  8:30 AM [name], PMHNP [facility] None  06/26/2019  9:00 AM Versie Starks, Audie Clear, NP [facility] None    Orders Placed This Encounter  Procedures  . Lamotrigine level      -------------------------------

## 2018-07-27 ENCOUNTER — Other Ambulatory Visit: Payer: Self-pay | Admitting: Psychiatry

## 2018-07-27 DIAGNOSIS — F411 Generalized anxiety disorder: Secondary | ICD-10-CM

## 2018-07-28 NOTE — Telephone Encounter (Signed)
You have sent refills but it says start in Jan. 2020, can she have some now? Last fill was 10/10 for #45.

## 2018-07-28 NOTE — Telephone Encounter (Signed)
Check pmp first

## 2018-07-29 NOTE — Telephone Encounter (Signed)
Have you seen this refill request?   Thanks

## 2018-07-29 NOTE — Telephone Encounter (Signed)
Spoke with pharmacy they have no rx's from 02/2018, called pt and she said she will look and give [place] a call back.

## 2018-08-08 ENCOUNTER — Encounter: Payer: Self-pay | Admitting: Adult Health

## 2018-08-08 ENCOUNTER — Ambulatory Visit (INDEPENDENT_AMBULATORY_CARE_PROVIDER_SITE_OTHER): Payer: 59 | Admitting: Adult Health

## 2018-08-08 VITALS — BP 126/78 | HR 80 | Resp 16 | Ht 67.5 in | Wt 260.0 lb

## 2018-08-08 DIAGNOSIS — Z6841 Body Mass Index (BMI) 40.0 and over, adult: Secondary | ICD-10-CM

## 2018-08-08 DIAGNOSIS — F411 Generalized anxiety disorder: Secondary | ICD-10-CM

## 2018-08-08 DIAGNOSIS — R5383 Other fatigue: Secondary | ICD-10-CM | POA: Diagnosis not present

## 2018-08-08 DIAGNOSIS — E039 Hypothyroidism, unspecified: Secondary | ICD-10-CM | POA: Diagnosis not present

## 2018-08-08 MED ORDER — PHENTERMINE HCL 37.5 MG PO TABS: 37.5000 mg | ORAL_TABLET | Freq: Every day | ORAL | 0 refills | Status: DC

## 2018-08-08 NOTE — Progress Notes (Signed)
[facility] [place], [place] [address]  Internal MEDICINE  Office Visit Note  Patient Name: Ashley Stephens  [id number]  [id number]  Date of Service: 08/08/2018  Chief Complaint  Patient presents with  . Medical Management of Chronic Issues    weight loss management    HPI  Patient is here for follow-up on anxiety as well as to discuss medical management of weight loss.  Patient has a history of gastric bypass surgery however has begun to put her weight back on.  She reports that she is currently walking about 45 minutes 3 times a week.  She has used weight loss medication in the past but she does not recall what medication she used.  She denies any other issues at this time.   Current Medication: Outpatient Encounter Medications as of 08/08/2018  Medication Sig  . ALPRAZolam (XANAX) 0.25 MG tablet TAKE 1 TABLET BY MOUTH TWICE DAILY AS NEEDED  . desvenlafaxine (PRISTIQ) 100 MG 24 hr tablet Take 1 tablet (100 mg total) by mouth every morning.  . desvenlafaxine (PRISTIQ) 50 MG 24 hr tablet Take 1 tablet (50 mg total) by mouth every evening.  . hydrOXYzine (VISTARIL) 50 MG capsule Take 1 capsule (50 mg total) by mouth 3 (three) times daily as needed.  . lamoTRIgine (LAMICTAL) 150 MG tablet Take 1 tablet (150 mg total) by mouth 2 (two) times daily.  Marland Kitchen levothyroxine (SYNTHROID, LEVOTHROID) 25 MCG tablet Take 1 tablet (25 mcg total) by mouth daily before breakfast.  . lurasidone (LATUDA) 20 MG TABS tablet Take 1 tab with Latuda 60 mg to equal total dose of 80 mg. Samples given.  . Lurasidone HCl (LATUDA) 60 MG TABS Take 60 mg by mouth daily.  Marland Kitchen omeprazole (PRILOSEC) 20 MG capsule Take 1 capsule (20 mg total) by mouth 2 (two) times daily before a meal.  . propranolol (INDERAL) 20 MG tablet Take 1 tablet (20 mg total) by mouth 2 (two) times daily as needed.  . phentermine (ADIPEX-P) 37.5 MG tablet Take 1 tablet (37.5 mg total) by mouth daily before breakfast.    No facility-administered encounter medications on file as of 08/08/2018.     Surgical History: Past Surgical History:  Procedure Laterality Date  . ABDOMINAL HYSTERECTOMY  2013  . BARIATRIC SURGERY    . BREAST BIOPSY  1995  . GASTRIC BYPASS  2012  . TUBAL LIGATION  1996    Medical History: Past Medical History:  Diagnosis Date  . Anxiety   . Depression   . Gastric ulcer   . GERD (gastroesophageal reflux disease)   . Hypertension   . Thyroid disorder     Family History: Family History  Problem Relation Age of Onset  . Breast cancer Maternal Grandmother     Social History   Socioeconomic History  . Marital status: Single    Spouse name: Not on file  . Number of children: Not on file  . Years of education: Not on file  . Highest education level: Not on file  Occupational History  . Not on file  Social Needs  . Financial resource strain: Not on file  . Food insecurity:    Worry: Not on file    Inability: Not on file  . Transportation needs:    Medical: Not on file    Non-medical: Not on file  Tobacco Use  . Smoking status: Never Smoker  . Smokeless tobacco: Never Used  Substance and Sexual Activity  . Alcohol use:  No    Alcohol/week: 0.0 standard drinks  . Drug use: No  . Sexual activity: Yes    Birth control/protection: Surgical  Lifestyle  . Physical activity:    Days per week: Not on file    Minutes per session: Not on file  . Stress: Not on file  Relationships  . Social connections:    Talks on phone: Not on file    Gets together: Not on file    Attends religious service: Not on file    Active member of club or organization: Not on file    Attends meetings of clubs or organizations: Not on file    Relationship status: Not on file  . Intimate partner violence:    Fear of current or ex partner: Not on file    Emotionally abused: Not on file    Physically abused: Not on file    Forced sexual activity: Not on file  Other Topics Concern  . Not  on file  Social History Narrative  . Not on file      Review of Systems  Constitutional: Negative for chills, fatigue and unexpected weight change.  HENT: Negative for congestion, rhinorrhea, sneezing and sore throat.   Eyes: Negative for photophobia, pain and redness.  Respiratory: Negative for cough, chest tightness and shortness of breath.   Cardiovascular: Negative for chest pain and palpitations.  Gastrointestinal: Negative for abdominal pain, constipation, diarrhea, nausea and vomiting.  Endocrine: Negative.   Genitourinary: Negative for dysuria and frequency.  Musculoskeletal: Negative for arthralgias, back pain, joint swelling and neck pain.  Skin: Negative for rash.  Allergic/Immunologic: Negative.   Neurological: Negative for tremors and numbness.  Hematological: Negative for adenopathy. Does not bruise/bleed easily.  Psychiatric/Behavioral: Negative for behavioral problems and sleep disturbance. The patient is not nervous/anxious.     Vital Signs: BP 126/78 (BP Location: Left Arm, Patient Position: Sitting, Cuff Size: Normal)   Pulse 80   Resp 16   Ht 5' 7.5" (1.715 m)   Wt 260 lb (117.9 kg)   SpO2 99%   BMI 40.12 kg/m    Physical Exam  Constitutional: She is oriented to person, place, and time. She appears well-developed and well-nourished. No distress.  HENT:  Head: Normocephalic and atraumatic.  Mouth/Throat: Oropharynx is clear and moist. No oropharyngeal exudate.  Eyes: Pupils are equal, round, and reactive to light. EOM are normal.  Neck: Normal range of motion. Neck supple. No JVD present. No tracheal deviation present. No thyromegaly present.  Cardiovascular: Normal rate, regular rhythm and normal heart sounds. Exam reveals no gallop and no friction rub.  No murmur heard. Pulmonary/Chest: Effort normal and breath sounds normal. No respiratory distress. She has no wheezes. She has no rales. She exhibits no tenderness.  Abdominal: Soft. There is no  tenderness. There is no guarding.  Musculoskeletal: Normal range of motion.  Lymphadenopathy:    She has no cervical adenopathy.  Neurological: She is alert and oriented to person, place, and time. No cranial nerve deficit.  Skin: Skin is warm and dry. She is not diaphoretic.  Psychiatric: She has a normal mood and affect. Her behavior is normal. Judgment and thought content normal.  Nursing note and vitals reviewed.  Assessment/Plan: 1. Generalized anxiety disorder Stable, continue current medication management for symptoms.  2. Morbid obesity with BMI of 40.0-44.9, adult ([place]) Discussed continued exercise routine in combination with medication and diet.  Encourage patient to eat a diet less than 1800 cal/day.  We discussed making good  choices and high-protein low carbohydrate foods. Obesity Counseling: Risk Assessment: An assessment of behavioral risk factors was made today and includes lack of exercise sedentary lifestyle, lack of portion control and poor dietary habits.  Risk Modification Advice: She was counseled on portion control guidelines. Restricting daily caloric intake to. . The detrimental long term effects of obesity on her health and ongoing poor compliance was also discussed with the patient.  There is a liability release in patients' chart. There has been a 10 minute discussion about the side effects including but not limited to elevated blood pressure, anxiety, lack of sleep and dry mouth. Pt understands and will like to start/continue on appetite suppressant at this time. There will be one month RX given at the time of visit with proper follow up. [place] diet plan with restricted calories is given to the pt. Pt understands and agrees with  plan of treatment - Metabolic Test - phentermine (ADIPEX-P) 37.5 MG tablet; Take 1 tablet (37.5 mg total) by mouth daily before breakfast.  Dispense: 30 tablet; Refill: 0 Refilled Controlled medications today. Reviewed risks and possible  side effects associated with taking Stimulants. Combination of these drugs with other psychotropic medications could cause dizziness and drowsiness. Pt needs to Monitor symptoms and exercise caution in driving and operating heavy machinery to avoid damages to oneself, to others and to the surroundings. Patient verbalized understanding in this matter. Dependence and abuse for these drugs will be monitored closely. A Controlled substance policy and procedure is on file which allows [facility] to order a urine drug screen test at any visit. Patient understands and agrees with the plan..  3. Acquired hypothyroidism Stable, most recent thyroid panel is within normal limits.  4. Fatigue, unspecified type Patient reports that she believes she is becoming menopausal. - FSH, LH labs ordered.  General Counseling: Samiah verbalizes understanding of the findings of todays visit and agrees with plan of treatment. I have discussed any further diagnostic evaluation that may be needed or ordered today. We also reviewed her medications today. she has been encouraged to call the office with any questions or concerns that should arise related to todays visit.    Orders Placed This Encounter  Procedures  . Metabolic Test    Meds ordered this encounter  Medications  . phentermine (ADIPEX-P) 37.5 MG tablet    Sig: Take 1 tablet (37.5 mg total) by mouth daily before breakfast.    Dispense:  30 tablet    Refill:  0    Time spent: 25 Minutes   This patient was seen by Orson Gear AGNP-C in Collaboration with Dr Lavera Guise as a part of collaborative care agreement     Kendell Bane AGNP-C Internal medicine

## 2018-08-08 NOTE — Patient Instructions (Signed)
Phentermine orally disintegrating tablets (ODT) What is this medicine? PHENTERMINE (FEN ter meen) decreases your appetite. It is used with a reduced calorie diet and exercise to help you lose weight. This medicine may be used for other purposes; ask your health care provider or pharmacist if you have questions. COMMON BRAND NAME(S): Suprenza What should I tell my health care provider before I take this medicine? They need to know if you have any of these conditions: -agitation -glaucoma -heart disease -high blood pressure -history of substance abuse -lung disease called Primary Pulmonary Hypertension (PPH) -taken an MAOI like Carbex, Eldepryl, Marplan, Nardil, or Parnate in last 14 days -thyroid disease -an unusual or allergic reaction to phentermine, other medicines, foods, dyes, or preservatives -pregnant or trying to get pregnant -breast-feeding How should I use this medicine? Take this medicine by mouth. Follow the directions on the prescription label. The tablets should stay in the bottle until immediately before you take your dose. Use dry hands to remove the dose from the bottle. Do not cut or split the tablets in half. Place the tablet in the mouth and allow it to dissolve, and then swallow. While you may take these tablets with water, it is not necessary to do so. Take your doses at regular intervals. Do not take your medicine more often than directed. Do not stop taking except on the advice of your doctor or health care professional. Talk to your pediatrician regarding the use of this medicine in children. Special care may be needed. Overdosage: If you think you have taken too much of this medicine contact a poison control center or emergency room at once. NOTE: This medicine is only for you. Do not share this medicine with others. What if I miss a dose? If you miss a dose, take it as soon as you can. If it is almost time for your next dose, take only that dose. Do not take double  or extra doses. What may interact with this medicine? Do not take this medicine with any of the following medications: -duloxetine -MAOIs like Carbex, Eldepryl, Marplan, Nardil, and Parnate -medicines for colds or breathing difficulties like pseudoephedrine or phenylephrine -procarbazine -sibutramine -SSRIs like citalopram, escitalopram, fluoxetine, fluvoxamine, paroxetine, and sertraline -stimulants like dexmethylphenidate, methylphenidate or modafinil -venlafaxine This medicine may also interact with the following medications: -medicines for diabetes This list may not describe all possible interactions. Give your health care provider a list of all the medicines, herbs, non-prescription drugs, or dietary supplements you use. Also tell them if you smoke, drink alcohol, or use illegal drugs. Some items may interact with your medicine. What should I watch for while using this medicine? Notify your physician immediately if you become short of breath while doing your normal activities. Do not take this medicine within 6 hours of bedtime. It can keep you from getting to sleep. Avoid drinks that contain caffeine and try to stick to a regular bedtime every night. This medicine is intended to be used in addition to a healthy diet and exercise. The best results are achieved this way. This medicine is only indicated for short-term use. Eventually your weight loss may level out. At that point, the drug will only help you maintain your new weight. Do not increase or in any way change your dose without consulting your doctor. You may get drowsy or dizzy. Do not drive, use machinery, or do anything that needs mental alertness until you know how this medicine affects you. Do not stand or sit up quickly,  especially if you are an older patient. This reduces the risk of dizzy or fainting spells. Alcohol may increase dizziness and drowsiness. Avoid alcoholic drinks. What side effects may I notice from receiving this  medicine? Side effects that you should report to your doctor or health care professional as soon as possible: -chest pain, palpitations -depression or severe changes in mood -increased blood pressure -irritability -nervousness or restlessness -severe dizziness -shortness of breath -problems urinating -unusual swelling of the legs -vomiting Side effects that usually do not require medical attention (report to your doctor or health care professional if they continue or are bothersome): -blurred vision or other eye problems -changes in sexual ability or desire -constipation or diarrhea -difficulty sleeping -dry mouth or unpleasant taste -headache -nausea This list may not describe all possible side effects. Call your doctor for medical advice about side effects. You may report side effects to FDA at 1-800-FDA-1088. Where should I keep my medicine? Keep out of the reach of children. This medicine can be abused. Keep your medicine in a safe place to protect it from theft. Do not share this medicine with anyone. Selling or giving away this medicine is dangerous and against the law. This medicine may cause accidental overdose and death if taken by other adults, children, or pets. Mix any unused medicine with a substance like cat litter or coffee grounds. Then throw the medicine away in a sealed container like a sealed bag or a coffee can with a lid. Do not use the medicine after the expiration date. Store at room temperature between 20 and 25 degrees C (68 and 77 degrees F). Keep container tightly closed. NOTE: This sheet is a summary. It may not cover all possible information. If you have questions about this medicine, talk to your doctor, pharmacist, or health care provider.  2018 Elsevier/Gold Standard (2014-05-11 16:18:47)

## 2018-08-22 ENCOUNTER — Ambulatory Visit: Payer: 59 | Admitting: Cardiovascular Disease

## 2018-08-22 ENCOUNTER — Ambulatory Visit: Payer: Self-pay | Admitting: Adult Health

## 2018-08-22 ENCOUNTER — Encounter

## 2018-08-25 ENCOUNTER — Encounter: Payer: Self-pay | Admitting: Cardiovascular Disease

## 2018-09-05 ENCOUNTER — Ambulatory Visit: Payer: Self-pay | Admitting: Adult Health

## 2018-09-12 ENCOUNTER — Encounter: Payer: Self-pay | Admitting: Psychiatry

## 2018-09-12 ENCOUNTER — Ambulatory Visit: Payer: 59 | Admitting: Psychiatry

## 2018-09-12 DIAGNOSIS — F411 Generalized anxiety disorder: Secondary | ICD-10-CM

## 2018-09-12 DIAGNOSIS — F39 Unspecified mood [affective] disorder: Secondary | ICD-10-CM | POA: Diagnosis not present

## 2018-09-12 DIAGNOSIS — F5101 Primary insomnia: Secondary | ICD-10-CM | POA: Diagnosis not present

## 2018-09-12 DIAGNOSIS — R69 Illness, unspecified: Secondary | ICD-10-CM | POA: Diagnosis not present

## 2018-09-12 DIAGNOSIS — Z0289 Encounter for other administrative examinations: Secondary | ICD-10-CM

## 2018-09-12 MED ORDER — ALPRAZOLAM 0.25 MG PO TABS: 0.2500 mg | ORAL_TABLET | Freq: Two times a day (BID) | ORAL | 5 refills | Status: DC | PRN

## 2018-09-12 MED ORDER — HYDROXYZINE PAMOATE 50 MG PO CAPS: 50.0000 mg | ORAL_CAPSULE | Freq: Three times a day (TID) | ORAL | 1 refills | Status: DC | PRN

## 2018-09-12 MED ORDER — LURASIDONE HCL 80 MG PO TABS: 80.0000 mg | ORAL_TABLET | Freq: Every day | ORAL | 3 refills | Status: DC

## 2018-09-12 MED ORDER — LAMOTRIGINE 150 MG PO TABS: 150.0000 mg | ORAL_TABLET | Freq: Two times a day (BID) | ORAL | 0 refills | Status: DC

## 2018-09-12 NOTE — Progress Notes (Signed)
Ashley Stephens [id number] November 14, 1975 43 y.o.  Subjective:   Patient ID:  Ashley Stephens is a 43 y.o. (DOB 30-Aug-1976) female.  Chief Complaint:  Chief Complaint  Patient presents with  . Anxiety  . Depression  . Insomnia    HPI Ashley Stephens presents to the office today for follow-up of mood and anxiety s/s. She reports some "mood swings" and some depression over the holidays which she anticipated. Reports slight improvement in mood lability. Reports irritability is "about the same." Reports "a little bit less anxiety." Reports moving 12/21-12/31 and this was stressful with minimal help. Reports that she plans to work on getting things settled and organized this weekend. Reports stress at work as well. Denies any recent panic attacks. She reports that she has periods of increased anxiety throughout the day and is able to use prn medications to manage increased anxiety. She reports poor concentration and "I feel all over the place." She reports difficulty keeping up with things (ie. Misplaced Xanax script from June 2019 and returned it today). She reports low energy and low motivation and reports that she is frequently using home delivery service for meals and is not leaving her home unless absolutely necessary. Now working 11 am-8 pm. Reports that her body is adjusting to new schedule and not falling asleep until later. Reports that she is falling asleep more easily. Reports that she has increased hydroxyzine to four 25 mg at HS to stay asleep. Appetite has been stable. She reports that motivation and energy were adequate for her move and pushed herself and then has had lower motivation afterwards. Denies SI.   Reports that she has not had an opportunity to have labs drawn yet.   Past medication trials: Wellbutrin Effexor Pristiq-effective Lamictal-effective for mood Latuda Xanax Propanolol Hydroxyzine Rexulti  Review of Systems:  Review of Systems   Musculoskeletal: Negative for gait problem.  Skin: Negative for rash.  Neurological: Negative for tremors.  Psychiatric/Behavioral:       Please refer to HPI    Medications: I have reviewed the patient's current medications.  Current Outpatient Medications  Medication Sig Dispense Refill  . ALPRAZolam (XANAX) 0.25 MG tablet Take 1 tablet (0.25 mg total) by mouth 2 (two) times daily as needed. 45 tablet 5  . desvenlafaxine (PRISTIQ) 100 MG 24 hr tablet Take 1 tablet (100 mg total) by mouth every morning. 90 tablet 1  . desvenlafaxine (PRISTIQ) 50 MG 24 hr tablet Take 1 tablet (50 mg total) by mouth every evening. 90 tablet 1  . lamoTRIgine (LAMICTAL) 150 MG tablet Take 1 tablet (150 mg total) by mouth 2 (two) times daily. 180 tablet 0  . levothyroxine (SYNTHROID, LEVOTHROID) 25 MCG tablet Take 1 tablet (25 mcg total) by mouth daily before breakfast. 30 tablet 3  . omeprazole (PRILOSEC) 20 MG capsule Take 1 capsule (20 mg total) by mouth 2 (two) times daily before a meal. 60 capsule 0  . phentermine (ADIPEX-P) 37.5 MG tablet Take 1 tablet (37.5 mg total) by mouth daily before breakfast. 30 tablet 0  . propranolol (INDERAL) 20 MG tablet Take 1 tablet (20 mg total) by mouth 2 (two) times daily as needed. 180 tablet 1  . hydrOXYzine (VISTARIL) 50 MG capsule Take 1 capsule (50 mg total) by mouth 3 (three) times daily as needed. 90 capsule 1  . lurasidone (LATUDA) 80 MG TABS tablet Take 1 tablet (80 mg total) by mouth daily with breakfast. 30 tablet 3   No current facility-administered  medications for this visit.     Medication Side Effects: None  Allergies: No Known Allergies  Past Medical History:  Diagnosis Date  . Anxiety   . Depression   . Gastric ulcer   . GERD (gastroesophageal reflux disease)   . Hypertension   . Thyroid disorder     Family History  Problem Relation Age of Onset  . Breast cancer Maternal Grandmother     Social History   Socioeconomic History  .  Marital status: Single    Spouse name: Not on file  . Number of children: Not on file  . Years of education: Not on file  . Highest education level: Not on file  Occupational History  . Not on file  Social Needs  . Financial resource strain: Not on file  . Food insecurity:    Worry: Not on file    Inability: Not on file  . Transportation needs:    Medical: Not on file    Non-medical: Not on file  Tobacco Use  . Smoking status: Never Smoker  . Smokeless tobacco: Never Used  Substance and Sexual Activity  . Alcohol use: No    Alcohol/week: 0.0 standard drinks  . Drug use: No  . Sexual activity: Yes    Birth control/protection: Surgical  Lifestyle  . Physical activity:    Days per week: Not on file    Minutes per session: Not on file  . Stress: Not on file  Relationships  . Social connections:    Talks on phone: Not on file    Gets together: Not on file    Attends religious service: Not on file    Active member of club or organization: Not on file    Attends meetings of clubs or organizations: Not on file    Relationship status: Not on file  . Intimate partner violence:    Fear of current or ex partner: Not on file    Emotionally abused: Not on file    Physically abused: Not on file    Forced sexual activity: Not on file  Other Topics Concern  . Not on file  Social History Narrative  . Not on file    Past Medical History, Surgical history, Social history, and Family history were reviewed and updated as appropriate.   Please see review of systems for further details on the patient's review from today.   Objective:   Physical Exam:  BP 106/64   Pulse (!) 56   Physical Exam Constitutional:      General: She is not in acute distress.    Appearance: She is well-developed.  Musculoskeletal:        General: No deformity.  Neurological:     Mental Status: She is alert and oriented to person, place, and time.     Coordination: Coordination normal.  Psychiatric:         Mood and Affect: Mood is anxious. Affect is not labile, blunt, angry or inappropriate.        Speech: Speech normal.        Behavior: Behavior normal.        Thought Content: Thought content normal. Thought content does not include homicidal or suicidal ideation. Thought content does not include homicidal or suicidal plan.        Judgment: Judgment normal.     Comments: Insight intact. No auditory or visual hallucinations. No delusions.  Mood presents as less depressed compared to some past exams.     Lab  Review:     Component Value Date/Time   NA 137 02/05/2018 1045   NA 138 12/27/2017 0740   NA 142 12/03/2011 1059   K 3.7 02/05/2018 1045   K 3.4 (L) 12/03/2011 1059   CL 104 02/05/2018 1045   CL 107 12/03/2011 1059   CO2 26 02/05/2018 1045   CO2 27 12/03/2011 1059   GLUCOSE 105 (H) 02/05/2018 1045   GLUCOSE 66 12/03/2011 1059   BUN 8 02/05/2018 1045   BUN 10 12/27/2017 0740   BUN 4 (L) 12/03/2011 1059   CREATININE 0.79 02/05/2018 1045   CREATININE 0.82 12/03/2011 1059   CALCIUM 9.0 02/05/2018 1045   CALCIUM 8.5 12/03/2011 1059   PROT 6.7 02/05/2018 1045   PROT 6.7 12/27/2017 0740   ALBUMIN 3.7 02/05/2018 1045   ALBUMIN 4.2 12/27/2017 0740   AST 19 02/05/2018 1045   ALT 16 02/05/2018 1045   ALKPHOS 61 02/05/2018 1045   BILITOT 0.2 (L) 02/05/2018 1045   BILITOT 0.2 12/27/2017 0740   GFRNONAA >60 02/05/2018 1045   GFRNONAA >60 12/03/2011 1059   GFRAA >60 02/05/2018 1045   GFRAA >60 12/03/2011 1059       Component Value Date/Time   WBC 4.7 02/05/2018 1045   RBC 4.65 02/05/2018 1045   HGB 13.4 02/05/2018 1045   HGB 13.9 12/27/2017 0740   HCT 39.5 02/05/2018 1045   HCT 40.9 12/27/2017 0740   PLT 294 02/05/2018 1045   PLT 331 12/27/2017 0740   MCV 84.9 02/05/2018 1045   MCV 85 12/27/2017 0740   MCH 28.8 02/05/2018 1045   MCHC 33.9 02/05/2018 1045   RDW 12.7 02/05/2018 1045   RDW 13.3 12/27/2017 0740   LYMPHSABS 1.4 02/05/2018 1045   LYMPHSABS 1.8  12/27/2017 0740   MONOABS 0.4 02/05/2018 1045   EOSABS 0.1 02/05/2018 1045   EOSABS 0.1 12/27/2017 0740   BASOSABS 0.0 02/05/2018 1045   BASOSABS 0.0 12/27/2017 0740    No results found for: POCLITH, LITHIUM   No results found for: PHENYTOIN, PHENOBARB, VALPROATE, CBMZ   .res Assessment: Plan:   Discussed treatment options with patient and agree with continuing current medications at this time since there has been some improvement in her mood and anxiety signs and symptoms.  Discussed that it is also difficult to determine response to recent medication changes since patient has had multiple stressors since medication change to include moving, the holidays, and work stress. Continue Latuda 80 mg daily for mood Continue lamotrigine for mood. Continue Pristiq 100 mg in the morning and 50 mg in the afternoon for depression and anxiety. Continue Xanax as needed for anxiety. Continue hydroxyzine for insomnia. Continue propanolol as needed for anxiety. Recommend continuing to see Rosary Lively, [facility] for psychotherapy.  .Primary insomnia - Plan: hydrOXYzine (VISTARIL) 50 MG capsule  Generalized anxiety disorder - Plan: ALPRAZolam (XANAX) 0.25 MG tablet  Mood disorder (HCC) - Plan: lamoTRIgine (LAMICTAL) 150 MG tablet, lurasidone (LATUDA) 80 MG TABS tablet  Please see After Visit Summary for patient specific instructions.  Future Appointments  Date Time Provider [place]  10/03/2018 10:45 AM Kendell Bane, NP [facility] None  11/06/2018  8:30 AM [name], PMHNP [facility] None  06/26/2019  9:00 AM Kendell Bane, NP [facility] None    No orders of the defined types were placed in this encounter.     -------------------------------

## 2018-09-17 DIAGNOSIS — Z0289 Encounter for other administrative examinations: Secondary | ICD-10-CM

## 2018-10-03 ENCOUNTER — Encounter: Payer: Self-pay | Admitting: Adult Health

## 2018-10-03 ENCOUNTER — Ambulatory Visit: Payer: 59 | Admitting: Adult Health

## 2018-10-03 VITALS — BP 118/78 | HR 62 | Resp 16 | Ht 67.5 in | Wt 260.0 lb

## 2018-10-03 DIAGNOSIS — Z6841 Body Mass Index (BMI) 40.0 and over, adult: Secondary | ICD-10-CM | POA: Diagnosis not present

## 2018-10-03 DIAGNOSIS — K279 Peptic ulcer, site unspecified, unspecified as acute or chronic, without hemorrhage or perforation: Secondary | ICD-10-CM

## 2018-10-03 DIAGNOSIS — R739 Hyperglycemia, unspecified: Secondary | ICD-10-CM | POA: Diagnosis not present

## 2018-10-03 DIAGNOSIS — F411 Generalized anxiety disorder: Secondary | ICD-10-CM

## 2018-10-03 LAB — POCT GLYCOSYLATED HEMOGLOBIN (HGB A1C): Hemoglobin A1C: 5.8 % — AB (ref 4.0–5.6)

## 2018-10-03 MED ORDER — SUCRALFATE 1 GM/10ML PO SUSP: 1.0000 g | Freq: Three times a day (TID) | ORAL | 0 refills | Status: DC

## 2018-10-03 NOTE — Progress Notes (Signed)
[facility] [place],  [id number]  Internal MEDICINE  Office Visit Note  Patient Name: Ashley Stephens  [id number]  [id number]  Date of Service: 10/24/2018  Chief Complaint  Patient presents with  . Medical Management of Chronic Issues    weight loss management , did not start phentermine due to hurting back , would like a1c checked today    HPI  Patient here for follow-up on medical management of weight loss.  She reports that after our last visit she did not start taking the phentermine because she began to have some back pain and was unable to exercise so she did not feel like it was a good time to start taking the medication.  Now that her back pain has essentially resolved she is ready to start taking the medication.  She is also has concerns today that she thinks her A1c needs to be checked.  She has times where she feels like her blood sugar very high and has no way to check it at home.  We will provide that for her today.  She also reports that she has a history of an ulcer post gastric bypass surgery.  A GI doctor she saw put her on Carafate when this initially happened and that helped resolve it.  That has been over a year and she feels like she is been having some epigastric pain burning and would like scription for Carafate.   Current Medication: Outpatient Encounter Medications as of 10/03/2018  Medication Sig  . [EXPIRED] ALPRAZolam (XANAX) 0.25 MG tablet Take 1 tablet (0.25 mg total) by mouth 2 (two) times daily as needed.  . desvenlafaxine (PRISTIQ) 100 MG 24 hr tablet Take 1 tablet (100 mg total) by mouth every morning.  . desvenlafaxine (PRISTIQ) 50 MG 24 hr tablet Take 1 tablet (50 mg total) by mouth every evening.  . hydrOXYzine (VISTARIL) 50 MG capsule Take 1 capsule (50 mg total) by mouth 3 (three) times daily as needed.  . lamoTRIgine (LAMICTAL) 150 MG tablet Take 1 tablet (150 mg total) by mouth 2 (two) times daily.  Marland Kitchen  levothyroxine (SYNTHROID, LEVOTHROID) 25 MCG tablet Take 1 tablet (25 mcg total) by mouth daily before breakfast.  . lurasidone (LATUDA) 80 MG TABS tablet Take 1 tablet (80 mg total) by mouth daily with breakfast.  . omeprazole (PRILOSEC) 20 MG capsule Take 1 capsule (20 mg total) by mouth 2 (two) times daily before a meal.  . propranolol (INDERAL) 20 MG tablet Take 1 tablet (20 mg total) by mouth 2 (two) times daily as needed.  . phentermine (ADIPEX-P) 37.5 MG tablet Take 1 tablet (37.5 mg total) by mouth daily before breakfast. (Patient not taking: Reported on 10/03/2018)  . sucralfate (CARAFATE) 1 GM/10ML suspension Take 10 mLs (1 g total) by mouth 4 (four) times daily -  with meals and at bedtime.   No facility-administered encounter medications on file as of 10/03/2018.     Surgical History: Past Surgical History:  Procedure Laterality Date  . ABDOMINAL HYSTERECTOMY  2013  . BARIATRIC SURGERY    . BREAST BIOPSY  1995  . GASTRIC BYPASS  2012  . TUBAL LIGATION  1996    Medical History: Past Medical History:  Diagnosis Date  . Anxiety   . Depression   . Gastric ulcer   . GERD (gastroesophageal reflux disease)   . Hypertension   . Thyroid disorder     Family History: Family History  Problem Relation Age of  Onset  . Breast cancer Maternal Grandmother     Social History   Socioeconomic History  . Marital status: Single    Spouse name: Not on file  . Number of children: Not on file  . Years of education: Not on file  . Highest education level: Not on file  Occupational History  . Not on file  Social Needs  . Financial resource strain: Not on file  . Food insecurity:    Worry: Not on file    Inability: Not on file  . Transportation needs:    Medical: Not on file    Non-medical: Not on file  Tobacco Use  . Smoking status: Never Smoker  . Smokeless tobacco: Never Used  Substance and Sexual Activity  . Alcohol use: No    Alcohol/week: 0.0 standard drinks  . Drug  use: No  . Sexual activity: Yes    Birth control/protection: Surgical  Lifestyle  . Physical activity:    Days per week: Not on file    Minutes per session: Not on file  . Stress: Not on file  Relationships  . Social connections:    Talks on phone: Not on file    Gets together: Not on file    Attends religious service: Not on file    Active member of club or organization: Not on file    Attends meetings of clubs or organizations: Not on file    Relationship status: Not on file  . Intimate partner violence:    Fear of current or ex partner: Not on file    Emotionally abused: Not on file    Physically abused: Not on file    Forced sexual activity: Not on file  Other Topics Concern  . Not on file  Social History Narrative  . Not on file      Review of Systems  Constitutional: Negative for chills, fatigue and unexpected weight change.  HENT: Negative for congestion, rhinorrhea, sneezing and sore throat.   Eyes: Negative for photophobia, pain and redness.  Respiratory: Negative for cough, chest tightness and shortness of breath.   Cardiovascular: Negative for chest pain and palpitations.  Gastrointestinal: Negative for abdominal pain, constipation, diarrhea, nausea and vomiting.  Endocrine: Negative.   Genitourinary: Negative for dysuria and frequency.  Musculoskeletal: Negative for arthralgias, back pain, joint swelling and neck pain.  Skin: Negative for rash.  Allergic/Immunologic: Negative.   Neurological: Negative for tremors and numbness.  Hematological: Negative for adenopathy. Does not bruise/bleed easily.  Psychiatric/Behavioral: Negative for behavioral problems and sleep disturbance. The patient is not nervous/anxious.     Vital Signs: BP 118/78   Pulse 62   Resp 16   Ht 5' 7.5" (1.715 m)   Wt 260 lb (117.9 kg)   SpO2 98%   BMI 40.12 kg/m    Physical Exam Vitals signs and nursing note reviewed.  Constitutional:      General: She is not in acute  distress.    Appearance: She is well-developed. She is not diaphoretic.  HENT:     Head: Normocephalic and atraumatic.     Mouth/Throat:     Pharynx: No oropharyngeal exudate.  Eyes:     Pupils: Pupils are equal, round, and reactive to light.  Neck:     Musculoskeletal: Normal range of motion and neck supple.     Thyroid: No thyromegaly.     Vascular: No JVD.     Trachea: No tracheal deviation.  Cardiovascular:     Rate and  Rhythm: Normal rate and regular rhythm.     Heart sounds: Normal heart sounds. No murmur. No friction rub. No gallop.   Pulmonary:     Effort: Pulmonary effort is normal. No respiratory distress.     Breath sounds: Normal breath sounds. No wheezing or rales.  Chest:     Chest wall: No tenderness.  Abdominal:     Palpations: Abdomen is soft.     Tenderness: There is no abdominal tenderness. There is no guarding.  Musculoskeletal: Normal range of motion.  Lymphadenopathy:     Cervical: No cervical adenopathy.  Skin:    General: Skin is warm and dry.  Neurological:     Mental Status: She is alert and oriented to person, place, and time.     Cranial Nerves: No cranial nerve deficit.  Psychiatric:        Behavior: Behavior normal.        Thought Content: Thought content normal.        Judgment: Judgment normal.    Assessment/Plan: 1. Peptic ulcer Due to patient's history of gastric bypass surgery and ulcer.  I have sent her prescription for Carafate to take 4 times a day with meals at bedtime.  Also encouraged her to follow-up with GI as she may need an endoscopy to evaluate her esophagus and stomach. - Ambulatory referral to Gastroenterology - sucralfate (CARAFATE) 1 GM/10ML suspension; Take 10 mLs (1 g total) by mouth 4 (four) times daily -  with meals and at bedtime.  Dispense: 420 mL; Refill: 0  2. Morbid obesity with BMI of 40.0-44.9, adult (HCC) Obesity Counseling: Risk Assessment: An assessment of behavioral risk factors was made today and includes  lack of exercise sedentary lifestyle, lack of portion control and poor dietary habits.  Risk Modification Advice: She was counseled on portion control guidelines. Restricting daily caloric intake to. . The detrimental long term effects of obesity on her health and ongoing poor compliance was also discussed with the patient.  3. Generalized anxiety disorder Patient reports overall her anxiety is doing well and she denies any issues currently.  4. Elevated blood sugar Patient's hemoglobin A1c 5.8 today. - POCT HgB A1C  General Counseling: Jodiann verbalizes understanding of the findings of todays visit and agrees with plan of treatment. I have discussed any further diagnostic evaluation that may be needed or ordered today. We also reviewed her medications today. she has been encouraged to call the office with any questions or concerns that should arise related to todays visit.    Orders Placed This Encounter  Procedures  . Ambulatory referral to Gastroenterology  . POCT HgB A1C    Meds ordered this encounter  Medications  . sucralfate (CARAFATE) 1 GM/10ML suspension    Sig: Take 10 mLs (1 g total) by mouth 4 (four) times daily -  with meals and at bedtime.    Dispense:  420 mL    Refill:  0    Time spent: 25 Minutes   This patient was seen by Orson Gear AGNP-C in Collaboration with Dr Lavera Guise as a part of collaborative care agreement     Kendell Bane AGNP-C Internal medicine

## 2018-10-07 ENCOUNTER — Encounter: Payer: Self-pay | Admitting: Gastroenterology

## 2018-10-24 ENCOUNTER — Encounter: Payer: Self-pay | Admitting: Adult Health

## 2018-10-31 ENCOUNTER — Ambulatory Visit: Payer: 59 | Admitting: Gastroenterology

## 2018-11-06 ENCOUNTER — Ambulatory Visit: Payer: 59 | Admitting: Psychiatry

## 2018-11-06 DIAGNOSIS — M545 Low back pain, unspecified: Secondary | ICD-10-CM | POA: Insufficient documentation

## 2018-11-14 ENCOUNTER — Other Ambulatory Visit: Payer: Self-pay

## 2018-11-14 ENCOUNTER — Encounter: Payer: Self-pay | Admitting: Psychiatry

## 2018-11-14 ENCOUNTER — Ambulatory Visit: Payer: 59 | Admitting: Psychiatry

## 2018-11-14 DIAGNOSIS — F5101 Primary insomnia: Secondary | ICD-10-CM

## 2018-11-14 DIAGNOSIS — F411 Generalized anxiety disorder: Secondary | ICD-10-CM | POA: Diagnosis not present

## 2018-11-14 DIAGNOSIS — F39 Unspecified mood [affective] disorder: Secondary | ICD-10-CM | POA: Diagnosis not present

## 2018-11-14 DIAGNOSIS — M545 Low back pain: Secondary | ICD-10-CM | POA: Diagnosis not present

## 2018-11-14 DIAGNOSIS — R69 Illness, unspecified: Secondary | ICD-10-CM | POA: Diagnosis not present

## 2018-11-14 MED ORDER — LAMOTRIGINE 150 MG PO TABS: 150.0000 mg | ORAL_TABLET | Freq: Two times a day (BID) | ORAL | 0 refills | Status: DC

## 2018-11-14 MED ORDER — ALPRAZOLAM 0.25 MG PO TABS: 0.2500 mg | ORAL_TABLET | Freq: Two times a day (BID) | ORAL | 2 refills | Status: DC | PRN

## 2018-11-14 MED ORDER — PROPRANOLOL HCL 20 MG PO TABS: 20.0000 mg | ORAL_TABLET | Freq: Two times a day (BID) | ORAL | 1 refills | Status: DC | PRN

## 2018-11-14 MED ORDER — LURASIDONE HCL 80 MG PO TABS: 80.0000 mg | ORAL_TABLET | Freq: Every day | ORAL | 3 refills | Status: DC

## 2018-11-14 NOTE — Progress Notes (Signed)
Ashley Stephens [id number] 1976/06/10 43 y.o.  Subjective:   Patient ID:  Ashley Stephens is a 48 y.o. (DOB December 25, 1975) female.  Chief Complaint:  Chief Complaint  Patient presents with  . Other    Acute grief reaction  . Depression  . Anxiety  . Sleeping Problem    HPI Ashley Stephens presents to the office today for follow-up of depression, anxiety, and sleep disturbance.   "The lady that raised me died last week and we just buried her." Reports that the death was unexpected and that pt arrived on the scene while they were waiting 2 hours for the coroner to pronounce her death. Reports that his was a significant loss for her and that this person has been there for her and her family for years and pt's children are also grieving.  She reports "it's hard to eat, it's hard to sleep." She reports that she is having difficulty concentrating and keeps thinking about this. She reports some intrusive memories of these images. Denies re-experiencing.   Reports that she has to resume work Sunday and she is "scared... I'm sad and I am afraid because I don't know what life is like without her." Reports that she has been grieving with family and friends "and I haven't really had to be home by myself." She reports difficulty getting up and getting going in the mornings. Reports that once she is awake and going she feels compelled to be with others that are grieving and has been neglecting other things that she needs to do, such as getting gas and groceries or eating. She reports that her energy and motivation are low. "If it's not something I have to do, I don't want to do it." She reports that she tends to focus more on the loss when she is at home. Reports that she has had episodes of severe anxiety. Reports that she has been having about 2 panic attacks daily. Has had decreased appetite and reports that she has been tired of eating. Recalls recently picking at food and not wanting  to eat meal that she was given.   Had MRI this morning and reports that she had severe anxiety with being in closed in space and kept having memories of loved one's dead body. Has been taking gabapentin for back pain.  Reports that gabapentin causes her to feel drowsy and she is using Xanax to help sleep and then is able to sleep about 4 hours. Reports vague suicidal thoughts. Denies plan or intent. Contracts for safety. Started Prednisone on Tuesday for back pain.   Denies any recent manic s/s.   She reports that before loss she was "still depressed with moments of anxiety... more of a stable situation."   Past medication trials: Wellbutrin Effexor Pristiq-effective Lamictal-effective for mood Latuda Xanax Propanolol Hydroxyzine Rexulti Gabapentin- prescribed for back pain   Review of Systems:  Review of Systems  Musculoskeletal: Positive for back pain. Negative for gait problem.  Neurological: Negative for tremors.  Psychiatric/Behavioral:       Please refer to HPI   Reports back pain after recent fall. 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  Medications: I have reviewed the patient's current medications.  Current Outpatient Medications  Medication Sig Dispense Refill  . desvenlafaxine (PRISTIQ) 100 MG 24 hr tablet Take 1 tablet (100 mg total) by mouth every morning. 90 tablet 1  . desvenlafaxine (PRISTIQ) 50 MG 24 hr tablet Take 1 tablet (50 mg total) by  mouth every evening. 90 tablet 1  . gabapentin (NEURONTIN) 300 MG capsule TAKE 1 CAPSULE BY MOUTH IN THE MORNING AND 2 AT NIGHT    . hydrOXYzine (VISTARIL) 50 MG capsule Take 1 capsule (50 mg total) by mouth 3 (three) times daily as needed. 90 capsule 1  . lamoTRIgine (LAMICTAL) 150 MG tablet Take 1 tablet (150 mg total) by mouth 2 (two) times daily. 180 tablet 0  . levothyroxine (SYNTHROID, LEVOTHROID) 25 MCG tablet Take 1 tablet (25 mcg total) by mouth daily before breakfast. 30 tablet 3  . omeprazole  (PRILOSEC) 20 MG capsule Take 1 capsule (20 mg total) by mouth 2 (two) times daily before a meal. (Patient taking differently: Take 20 mg by mouth 2 (two) times daily as needed. ) 60 capsule 0  . propranolol (INDERAL) 20 MG tablet Take 1 tablet (20 mg total) by mouth 2 (two) times daily as needed. 180 tablet 1  . ALPRAZolam (XANAX) 0.25 MG tablet Take 1 tablet (0.25 mg total) by mouth 2 (two) times daily as needed for up to 30 days for anxiety. 45 tablet 2  . lurasidone (LATUDA) 80 MG TABS tablet Take 1 tablet (80 mg total) by mouth daily with breakfast for 30 days. 30 tablet 3  . predniSONE (DELTASONE) 10 MG tablet     . sucralfate (CARAFATE) 1 GM/10ML suspension Take 10 mLs (1 g total) by mouth 4 (four) times daily -  with meals and at bedtime. (Patient not taking: Reported on 11/14/2018) 420 mL 0   No current facility-administered medications for this visit.     Medication Side Effects: None  Allergies: No Known Allergies  Past Medical History:  Diagnosis Date  . Anxiety   . Depression   . Gastric ulcer   . GERD (gastroesophageal reflux disease)   . Hypertension   . Thyroid disorder     Family History  Problem Relation Age of Onset  . Breast cancer Maternal Grandmother     Social History   Socioeconomic History  . Marital status: Single    Spouse name: Not on file  . Number of children: Not on file  . Years of education: Not on file  . Highest education level: Not on file  Occupational History  . Not on file  Social Needs  . Financial resource strain: Not on file  . Food insecurity:    Worry: Not on file    Inability: Not on file  . Transportation needs:    Medical: Not on file    Non-medical: Not on file  Tobacco Use  . Smoking status: Never Smoker  . Smokeless tobacco: Never Used  Substance and Sexual Activity  . Alcohol use: No    Alcohol/week: 0.0 standard drinks  . Drug use: No  . Sexual activity: Yes    Birth control/protection: Surgical  Lifestyle  .  Physical activity:    Days per week: Not on file    Minutes per session: Not on file  . Stress: Not on file  Relationships  . Social connections:    Talks on phone: Not on file    Gets together: Not on file    Attends religious service: Not on file    Active member of club or organization: Not on file    Attends meetings of clubs or organizations: Not on file    Relationship status: Not on file  . Intimate partner violence:    Fear of current or ex partner: Not on file  Emotionally abused: Not on file    Physically abused: Not on file    Forced sexual activity: Not on file  Other Topics Concern  . Not on file  Social History Narrative  . Not on file    Past Medical History, Surgical history, Social history, and Family history were reviewed and updated as appropriate.   Please see review of systems for further details on the patient's review from today.   Objective:   Physical Exam:  There were no vitals taken for this visit.  Physical Exam Constitutional:      General: She is not in acute distress.    Appearance: She is well-developed.  Musculoskeletal:        General: No deformity.  Neurological:     Mental Status: She is alert and oriented to person, place, and time.     Coordination: Coordination normal.  Psychiatric:        Attention and Perception: Attention and perception normal. She does not perceive auditory or visual hallucinations.        Mood and Affect: Mood is anxious and depressed. Affect is tearful. Affect is not labile, blunt, angry or inappropriate.        Speech: Speech normal.        Behavior: Behavior normal.        Thought Content: Thought content normal. Thought content does not include homicidal or suicidal ideation. Thought content does not include homicidal or suicidal plan.        Cognition and Memory: Cognition and memory normal.        Judgment: Judgment normal.     Comments: Insight intact. No delusions.      Lab Review:      Component Value Date/Time   NA 137 02/05/2018 1045   NA 138 12/27/2017 0740   NA 142 12/03/2011 1059   K 3.7 02/05/2018 1045   K 3.4 (L) 12/03/2011 1059   CL 104 02/05/2018 1045   CL 107 12/03/2011 1059   CO2 26 02/05/2018 1045   CO2 27 12/03/2011 1059   GLUCOSE 105 (H) 02/05/2018 1045   GLUCOSE 66 12/03/2011 1059   BUN 8 02/05/2018 1045   BUN 10 12/27/2017 0740   BUN 4 (L) 12/03/2011 1059   CREATININE 0.79 02/05/2018 1045   CREATININE 0.82 12/03/2011 1059   CALCIUM 9.0 02/05/2018 1045   CALCIUM 8.5 12/03/2011 1059   PROT 6.7 02/05/2018 1045   PROT 6.7 12/27/2017 0740   ALBUMIN 3.7 02/05/2018 1045   ALBUMIN 4.2 12/27/2017 0740   AST 19 02/05/2018 1045   ALT 16 02/05/2018 1045   ALKPHOS 61 02/05/2018 1045   BILITOT 0.2 (L) 02/05/2018 1045   BILITOT 0.2 12/27/2017 0740   GFRNONAA >60 02/05/2018 1045   GFRNONAA >60 12/03/2011 1059   GFRAA >60 02/05/2018 1045   GFRAA >60 12/03/2011 1059       Component Value Date/Time   WBC 4.7 02/05/2018 1045   RBC 4.65 02/05/2018 1045   HGB 13.4 02/05/2018 1045   HGB 13.9 12/27/2017 0740   HCT 39.5 02/05/2018 1045   HCT 40.9 12/27/2017 0740   PLT 294 02/05/2018 1045   PLT 331 12/27/2017 0740   MCV 84.9 02/05/2018 1045   MCV 85 12/27/2017 0740   MCH 28.8 02/05/2018 1045   MCHC 33.9 02/05/2018 1045   RDW 12.7 02/05/2018 1045   RDW 13.3 12/27/2017 0740   LYMPHSABS 1.4 02/05/2018 1045   LYMPHSABS 1.8 12/27/2017 0740   MONOABS 0.4 02/05/2018 1045  EOSABS 0.1 02/05/2018 1045   EOSABS 0.1 12/27/2017 0740   BASOSABS 0.0 02/05/2018 1045   BASOSABS 0.0 12/27/2017 0740    No results found for: POCLITH, LITHIUM   No results found for: PHENYTOIN, PHENOBARB, VALPROATE, CBMZ   .res Assessment: Plan:   Discussed treatment options and patient reports that she would like to continue current medications since she attributes recent worsening in mood and anxiety to acute grief and that medications have been beneficial overall.   Recommended patient consider seeing therapist to process recent events and for grief support. Will continue current medications. Patient advised to contact office with any questions, adverse effects, or acute worsening in signs and symptoms.  Mood disorder ([place]) - Plan: lamoTRIgine (LAMICTAL) 150 MG tablet, lurasidone (LATUDA) 80 MG TABS tablet  Generalized anxiety disorder - Plan: propranolol (INDERAL) 20 MG tablet, ALPRAZolam (XANAX) 0.25 MG tablet  Primary insomnia  Mood disorder ([facility]) - Chronic - Plan: lamoTRIgine (LAMICTAL) 150 MG tablet, lurasidone (LATUDA) 80 MG TABS tablet  Please see After Visit Summary for patient specific instructions.  Future Appointments  Date Time Provider [place]  11/28/2018 11:00 AM Kendell Bane, NP [facility] None  12/09/2018  8:30 AM [name], PMHNP [facility] None  06/26/2019  9:00 AM Kendell Bane, NP [facility] None    No orders of the defined types were placed in this encounter.     -------------------------------

## 2018-11-21 DIAGNOSIS — M545 Low back pain: Secondary | ICD-10-CM | POA: Diagnosis not present

## 2018-11-28 ENCOUNTER — Ambulatory Visit: Payer: Self-pay | Admitting: Nurse Practitioner

## 2018-12-04 DIAGNOSIS — M5136 Other intervertebral disc degeneration, lumbar region: Secondary | ICD-10-CM | POA: Diagnosis not present

## 2018-12-09 ENCOUNTER — Ambulatory Visit: Payer: 59 | Admitting: Psychiatry

## 2018-12-15 ENCOUNTER — Other Ambulatory Visit: Payer: Self-pay

## 2018-12-15 DIAGNOSIS — E039 Hypothyroidism, unspecified: Secondary | ICD-10-CM

## 2018-12-15 MED ORDER — LEVOTHYROXINE SODIUM 25 MCG PO TABS: 25.0000 ug | ORAL_TABLET | Freq: Every day | ORAL | 2 refills | Status: DC

## 2018-12-15 NOTE — Telephone Encounter (Signed)
Pt advised need follow in 2 months for med refills

## 2018-12-18 ENCOUNTER — Other Ambulatory Visit: Payer: Self-pay

## 2018-12-18 DIAGNOSIS — E039 Hypothyroidism, unspecified: Secondary | ICD-10-CM

## 2018-12-18 MED ORDER — LEVOTHYROXINE SODIUM 25 MCG PO TABS: 25.0000 ug | ORAL_TABLET | Freq: Every day | ORAL | 2 refills | Status: DC

## 2019-01-01 DIAGNOSIS — M5136 Other intervertebral disc degeneration, lumbar region: Secondary | ICD-10-CM | POA: Diagnosis not present

## 2019-01-23 ENCOUNTER — Encounter: Payer: Self-pay | Admitting: Psychiatry

## 2019-01-23 ENCOUNTER — Other Ambulatory Visit: Payer: Self-pay

## 2019-01-23 ENCOUNTER — Ambulatory Visit (INDEPENDENT_AMBULATORY_CARE_PROVIDER_SITE_OTHER): Payer: 59 | Admitting: Psychiatry

## 2019-01-23 DIAGNOSIS — R69 Illness, unspecified: Secondary | ICD-10-CM | POA: Diagnosis not present

## 2019-01-23 DIAGNOSIS — F39 Unspecified mood [affective] disorder: Secondary | ICD-10-CM

## 2019-01-23 DIAGNOSIS — F5101 Primary insomnia: Secondary | ICD-10-CM | POA: Diagnosis not present

## 2019-01-23 DIAGNOSIS — F411 Generalized anxiety disorder: Secondary | ICD-10-CM | POA: Diagnosis not present

## 2019-01-23 MED ORDER — OXCARBAZEPINE 300 MG PO TABS: ORAL_TABLET | ORAL | 1 refills | Status: DC

## 2019-01-23 MED ORDER — DESVENLAFAXINE SUCCINATE ER 50 MG PO TB24: 50.0000 mg | ORAL_TABLET | Freq: Every evening | ORAL | 1 refills | Status: DC

## 2019-01-23 MED ORDER — DESVENLAFAXINE SUCCINATE ER 100 MG PO TB24: 100.0000 mg | ORAL_TABLET | ORAL | 1 refills | Status: DC

## 2019-01-23 MED ORDER — LURASIDONE HCL 80 MG PO TABS: 80.0000 mg | ORAL_TABLET | Freq: Every day | ORAL | 3 refills | Status: DC

## 2019-01-23 MED ORDER — HYDROXYZINE PAMOATE 50 MG PO CAPS: 50.0000 mg | ORAL_CAPSULE | Freq: Three times a day (TID) | ORAL | 1 refills | Status: DC | PRN

## 2019-01-23 MED ORDER — LAMOTRIGINE 150 MG PO TABS: 150.0000 mg | ORAL_TABLET | Freq: Two times a day (BID) | ORAL | 0 refills | Status: DC

## 2019-01-23 NOTE — Progress Notes (Signed)
Ashley Stephens [id number] Mar 29, 1976 43 y.o.  Virtual Visit via Telephone Note  I connected with pt on 01/23/19 at  9:30 AM EDT by telephone and verified that I am speaking with the correct person using two identifiers.   I discussed the limitations, risks, security and privacy concerns of performing an evaluation and management service by telephone and the availability of in person appointments. I also discussed with the patient that there may be a patient responsible charge related to this service. The patient expressed understanding and agreed to proceed.   I discussed the assessment and treatment plan with the patient. The patient was provided an opportunity to ask questions and all were answered. The patient agreed with the plan and demonstrated an understanding of the instructions.   The patient was advised to call back or seek an in-person evaluation if the symptoms worsen or if the condition fails to improve as anticipated.  I provided 30 minutes of non-face-to-face time during this encounter.  The patient was located at home.  The provider was located at home.   [name], PMHNP   Subjective:   Patient ID:  Tammey Deeg is a 43 y.o. (DOB Aug 11, 1976) female.  Chief Complaint:  Chief Complaint  Patient presents with  . Depression  . Anxiety  . Insomnia    HPI [facility] presents for follow-up of mood and anxiety. She reports that she experienced a "downward spiral" after last office visit in response to losing her aunt that was her mother figure. "To cope I have taken more of my medication than I am supposed to take." Mood has been persistently sad- "it's grief." Has also been experiencing irritability. She reports that she has been having severe anxiety. She reports that her sleep has been poor and can be physically tired "but my mind takes over and I am wide awake." Reports that anxiety has been persistently elevated. She reports frequent  worry and anxious thoughts. Reports that she is frequently thinking about how her aunt died and if she is ok. She reports that she has also been experiencing panic attacks. Had severe anxiety with going to a store recently. Reports that she does not want to go out in public due to anxiety and being self-conscious about her appearance. Reports that she has been socially withdrawn. Reports feelings of hopelessness. She reports that her energy and motivation have been very low and is staying in bed when not working. Reports that she has been emotional eating- "I just constantly eat." Reports binge eating at times with eating large amounts of food without feeling hungry. She reports that she has difficulty with concentration and occasionally has difficulty paying attention to work calls and having to ask people to repeat themselves. Denies impulsive or risky behaviors.   Reports sleeping about 5 hours, fragmented. Denies nightmares.   Reports having vague thoughts of suicide. Denies suicidal intent or plan. Contracts for safety.   Continues to work from home during the pandemic.   Past medication trials: Wellbutrin Effexor Pristiq-effective Lamictal-effective for mood Latuda Xanax- Usually takes at night Propanolol- Effective Hydroxyzine Rexulti Gabapentin- prescribed for back pain. Dizziness, headaches.  Lithium   Review of Systems:  Review of Systems  Musculoskeletal: Positive for back pain. Negative for gait problem.  Neurological: Negative for tremors.  Psychiatric/Behavioral:       Please refer to HPI    Medications: I have reviewed the patient's current medications.  Current Outpatient Medications  Medication Sig Dispense Refill  .  desvenlafaxine (PRISTIQ) 100 MG 24 hr tablet Take 1 tablet (100 mg total) by mouth every morning. 90 tablet 1  . desvenlafaxine (PRISTIQ) 50 MG 24 hr tablet Take 1 tablet (50 mg total) by mouth every evening. 90 tablet 1  . lamoTRIgine (LAMICTAL) 150  MG tablet Take 1 tablet (150 mg total) by mouth 2 (two) times daily. 180 tablet 0  . levothyroxine (SYNTHROID, LEVOTHROID) 25 MCG tablet Take 1 tablet (25 mcg total) by mouth daily before breakfast. 30 tablet 2  . omeprazole (PRILOSEC) 20 MG capsule Take 1 capsule (20 mg total) by mouth 2 (two) times daily before a meal. (Patient taking differently: Take 20 mg by mouth daily as needed. ) 60 capsule 0  . propranolol (INDERAL) 20 MG tablet Take 1 tablet (20 mg total) by mouth 2 (two) times daily as needed. 180 tablet 1  . ALPRAZolam (XANAX) 0.25 MG tablet Take 1 tablet (0.25 mg total) by mouth 2 (two) times daily as needed for up to 30 days for anxiety. 45 tablet 2  . hydrOXYzine (VISTARIL) 50 MG capsule Take 1 capsule (50 mg total) by mouth 3 (three) times daily as needed. 90 capsule 1  . lurasidone (LATUDA) 80 MG TABS tablet Take 1 tablet (80 mg total) by mouth daily with breakfast for 30 days. 30 tablet 3  . Oxcarbazepine (TRILEPTAL) 300 MG tablet Take 1/2 tab po QHS x 3-5 days, then increase to 1 tab po QHS 30 tablet 1  . sucralfate (CARAFATE) 1 GM/10ML suspension Take 10 mLs (1 g total) by mouth 4 (four) times daily -  with meals and at bedtime. (Patient not taking: Reported on 11/14/2018) 420 mL 0   No current facility-administered medications for this visit.     Medication Side Effects: None  Allergies: No Known Allergies  Past Medical History:  Diagnosis Date  . Anxiety   . Depression   . Gastric ulcer   . GERD (gastroesophageal reflux disease)   . Hypertension   . Thyroid disorder     Family History  Problem Relation Age of Onset  . Breast cancer Maternal Grandmother     Social History   Socioeconomic History  . Marital status: Single    Spouse name: Not on file  . Number of children: Not on file  . Years of education: Not on file  . Highest education level: Not on file  Occupational History  . Not on file  Social Needs  . Financial resource strain: Not on file  .  Food insecurity:    Worry: Not on file    Inability: Not on file  . Transportation needs:    Medical: Not on file    Non-medical: Not on file  Tobacco Use  . Smoking status: Never Smoker  . Smokeless tobacco: Never Used  Substance and Sexual Activity  . Alcohol use: No    Alcohol/week: 0.0 standard drinks  . Drug use: No  . Sexual activity: Yes    Birth control/protection: Surgical  Lifestyle  . Physical activity:    Days per week: Not on file    Minutes per session: Not on file  . Stress: Not on file  Relationships  . Social connections:    Talks on phone: Not on file    Gets together: Not on file    Attends religious service: Not on file    Active member of club or organization: Not on file    Attends meetings of clubs or organizations: Not on file  Relationship status: Not on file  . Intimate partner violence:    Fear of current or ex partner: Not on file    Emotionally abused: Not on file    Physically abused: Not on file    Forced sexual activity: Not on file  Other Topics Concern  . Not on file  Social History Narrative  . Not on file    Past Medical History, Surgical history, Social history, and Family history were reviewed and updated as appropriate.   Please see review of systems for further details on the patient's review from today.   Objective:   Physical Exam:  There were no vitals taken for this visit.  Physical Exam Neurological:     Mental Status: She is alert and oriented to person, place, and time.     Cranial Nerves: No dysarthria.  Psychiatric:        Attention and Perception: Attention normal.        Mood and Affect: Mood is anxious and depressed.        Speech: Speech normal.        Behavior: Behavior is cooperative.        Thought Content: Thought content normal. Thought content is not paranoid or delusional. Thought content does not include homicidal or suicidal ideation. Thought content does not include homicidal or suicidal plan.         Cognition and Memory: Cognition and memory normal.        Judgment: Judgment normal.     Lab Review:     Component Value Date/Time   NA 137 02/05/2018 1045   NA 138 12/27/2017 0740   NA 142 12/03/2011 1059   K 3.7 02/05/2018 1045   K 3.4 (L) 12/03/2011 1059   CL 104 02/05/2018 1045   CL 107 12/03/2011 1059   CO2 26 02/05/2018 1045   CO2 27 12/03/2011 1059   GLUCOSE 105 (H) 02/05/2018 1045   GLUCOSE 66 12/03/2011 1059   BUN 8 02/05/2018 1045   BUN 10 12/27/2017 0740   BUN 4 (L) 12/03/2011 1059   CREATININE 0.79 02/05/2018 1045   CREATININE 0.82 12/03/2011 1059   CALCIUM 9.0 02/05/2018 1045   CALCIUM 8.5 12/03/2011 1059   PROT 6.7 02/05/2018 1045   PROT 6.7 12/27/2017 0740   ALBUMIN 3.7 02/05/2018 1045   ALBUMIN 4.2 12/27/2017 0740   AST 19 02/05/2018 1045   ALT 16 02/05/2018 1045   ALKPHOS 61 02/05/2018 1045   BILITOT 0.2 (L) 02/05/2018 1045   BILITOT 0.2 12/27/2017 0740   GFRNONAA >60 02/05/2018 1045   GFRNONAA >60 12/03/2011 1059   GFRAA >60 02/05/2018 1045   GFRAA >60 12/03/2011 1059       Component Value Date/Time   WBC 4.7 02/05/2018 1045   RBC 4.65 02/05/2018 1045   HGB 13.4 02/05/2018 1045   HGB 13.9 12/27/2017 0740   HCT 39.5 02/05/2018 1045   HCT 40.9 12/27/2017 0740   PLT 294 02/05/2018 1045   PLT 331 12/27/2017 0740   MCV 84.9 02/05/2018 1045   MCV 85 12/27/2017 0740   MCH 28.8 02/05/2018 1045   MCHC 33.9 02/05/2018 1045   RDW 12.7 02/05/2018 1045   RDW 13.3 12/27/2017 0740   LYMPHSABS 1.4 02/05/2018 1045   LYMPHSABS 1.8 12/27/2017 0740   MONOABS 0.4 02/05/2018 1045   EOSABS 0.1 02/05/2018 1045   EOSABS 0.1 12/27/2017 0740   BASOSABS 0.0 02/05/2018 1045   BASOSABS 0.0 12/27/2017 0740    No results found  for: POCLITH, LITHIUM   No results found for: PHENYTOIN, PHENOBARB, VALPROATE, CBMZ   .res Assessment: Plan:   Discussed several treatment options with patient to include potential benefits, risk, and side effects of Trileptal.   Discussed that Trileptal may be helpful for her insomnia, anxiety, and irritability.  Patient agrees to trial of Trileptal.  Will start Trileptal 300 mg one half tab at bedtime for 3 to 5 days, then increase to 1 tablet p.o. nightly. Will continue Pristiq 100 mg in the morning and 50 mg in the evening for depression. Continue Lamictal 150 mg twice daily for mood signs and symptoms. Continue Latuda 80 mg daily with a meal for mood signs and symptoms. Continue propranolol and alprazolam as needed for anxiety. Continue hydroxyzine as needed for anxiety and insomnia. Recommended seeing therapist to have support with grief and to process loss and also to help with mood and anxiety signs and symptoms.  Patient agrees with this plan. Patient to follow-up with this provider in 4 weeks or sooner if clinically indicated. Patient advised to contact office with any questions, adverse effects, or acute worsening in signs and symptoms.  Mood disorder ([place]) - Plan: Oxcarbazepine (TRILEPTAL) 300 MG tablet, desvenlafaxine (PRISTIQ) 100 MG 24 hr tablet, desvenlafaxine (PRISTIQ) 50 MG 24 hr tablet, lamoTRIgine (LAMICTAL) 150 MG tablet, lurasidone (LATUDA) 80 MG TABS tablet  Generalized anxiety disorder - Plan: Oxcarbazepine (TRILEPTAL) 300 MG tablet, desvenlafaxine (PRISTIQ) 100 MG 24 hr tablet, desvenlafaxine (PRISTIQ) 50 MG 24 hr tablet  Primary insomnia - Plan: Oxcarbazepine (TRILEPTAL) 300 MG tablet  Please see After Visit Summary for patient specific instructions.  Future Appointments  Date Time Provider [place]  01/30/2019  9:00 AM Rosary Lively, [facility] None  02/20/2019  8:30 AM [name], PMHNP [facility] None  06/26/2019  9:00 AM Versie Starks, Audie Clear, NP [facility] None    No orders of the defined types were placed in this encounter.     -------------------------------

## 2019-01-30 ENCOUNTER — Encounter: Payer: Self-pay | Admitting: Mental Health

## 2019-01-30 ENCOUNTER — Ambulatory Visit (INDEPENDENT_AMBULATORY_CARE_PROVIDER_SITE_OTHER): Payer: 59 | Admitting: Mental Health

## 2019-01-30 ENCOUNTER — Other Ambulatory Visit: Payer: Self-pay

## 2019-01-30 DIAGNOSIS — F39 Unspecified mood [affective] disorder: Secondary | ICD-10-CM | POA: Diagnosis not present

## 2019-01-30 DIAGNOSIS — F411 Generalized anxiety disorder: Secondary | ICD-10-CM | POA: Diagnosis not present

## 2019-01-30 DIAGNOSIS — R69 Illness, unspecified: Secondary | ICD-10-CM | POA: Diagnosis not present
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
## 2019-02-20 ENCOUNTER — Ambulatory Visit: Payer: 59 | Admitting: Psychiatry

## 2019-02-20 ENCOUNTER — Other Ambulatory Visit: Payer: Self-pay

## 2019-02-20 ENCOUNTER — Encounter: Payer: Self-pay | Admitting: Psychiatry

## 2019-02-20 DIAGNOSIS — F5101 Primary insomnia: Secondary | ICD-10-CM | POA: Diagnosis not present

## 2019-02-20 DIAGNOSIS — F411 Generalized anxiety disorder: Secondary | ICD-10-CM

## 2019-02-20 DIAGNOSIS — F39 Unspecified mood [affective] disorder: Secondary | ICD-10-CM | POA: Diagnosis not present

## 2019-02-20 DIAGNOSIS — R69 Illness, unspecified: Secondary | ICD-10-CM | POA: Diagnosis not present

## 2019-02-20 MED ORDER — LURASIDONE HCL 80 MG PO TABS: 80.0000 mg | ORAL_TABLET | Freq: Every day | ORAL | 3 refills | Status: DC

## 2019-02-20 MED ORDER — LAMOTRIGINE 150 MG PO TABS: 150.0000 mg | ORAL_TABLET | Freq: Two times a day (BID) | ORAL | 0 refills | Status: DC

## 2019-02-20 MED ORDER — ALPRAZOLAM 0.25 MG PO TABS: 0.2500 mg | ORAL_TABLET | Freq: Two times a day (BID) | ORAL | 2 refills | Status: DC | PRN

## 2019-02-20 MED ORDER — OXCARBAZEPINE 300 MG PO TABS: ORAL_TABLET | ORAL | 0 refills | Status: DC
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
## 2019-03-12 ENCOUNTER — Other Ambulatory Visit: Payer: Self-pay | Admitting: Psychiatry

## 2019-03-13 ENCOUNTER — Ambulatory Visit: Payer: 59 | Admitting: Psychiatry

## 2019-03-13 ENCOUNTER — Other Ambulatory Visit: Payer: Self-pay

## 2019-03-13 DIAGNOSIS — F39 Unspecified mood [affective] disorder: Secondary | ICD-10-CM

## 2019-03-13 DIAGNOSIS — R69 Illness, unspecified: Secondary | ICD-10-CM | POA: Diagnosis not present

## 2019-03-13 NOTE — Progress Notes (Signed)
[facility] Counsel[facility] Initial Adult Exam  Name: Ashley Stephens Date: 03/13/2019 MRN: [id number] DOB: 1976-04-25 PCP: Lavera Guise, MD    Time spent: 60 minutes     9:00am to 10:00am  Symptoms:  Grief (aunt who had some health issues raised her died in 12/01/2018, history of depression).  Still feeling helpless and hopeless.  Stays in bed often and that feels "protective".  Mental Status Exam:   Appearance:   casual  Behavior:  Sharing   Motor:  normal  Speech/Language:   Clear and Coherent  Affect:  Depressed, Tearful and grieving  Mood:  anxious, depressed, irritable and sad  Thought process:  negative  Thought content:    Rumination and negative  Sensory/Perceptual disturbances:    WNL  Orientation:  oriented to person, place and time/date  Attention:  Good  Concentration:  Good  Memduring the y:  WNL  Fund of knowledge:   Good  Insight:    Good  Judgment:   Good  Impulse Control:  Good   Reported Symptoms:  Sad, grieving, feels helpless and hopeless. Staying in the bed during the day.   Risk Assessment: Danger to Self:  No Self-injurious Behavior: No Danger to Others: No Duty to Warn:no Physical Aggression / Violence:No  Access to Firearms a concern: No  Gang Involvement:No  Patient / guardian was educated about steps to take if suicide or homicide risk level increases between visits: n/a While future psychiatric events cannot be accurately predicted, the patient does not currently require acute inpatient psychiatric care and does not currently meet [facility] involuntary commitment criteria.  Subjective: Patient has been transferred to me from a prior therapist, for followup in working on her grief and depression.  Works at home for [employer]On 1-10 scale, her tearfulness is an "8" and her depression is a "25 on Sep 27, 2022 scale".  Grief (due to death of maternal aunt that raised her,on top of her depression has been difficult.  Has 1 friend especially  that she met in 6th grade and they are still friends.  Patient living with 70 yr old daughter who is 3rd grade teacher at [employer] in [place].  "Best friends" with my daughter. Patient also has a son, 65 yr old still living in [place]. Enjoys restaurants, shopping, and she "longs for a dating relationship".  Admits that she uses food for comfort and shares that she has gained about 10-15 lbs since 12/01/22 of this year.  What brings me joy....Marland Kitchen"seeing others happy, being with my kids, experiencing love".  Admits to not having good boundaries with others. 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  Patient very verbal today about her feelings with "what all is going on in the world that upsets her".  Talked openly and admits that "I overthink things" and demonstrates this in her talking today.  Closer to end of session, she was calmer and able to focus some of goals but still easily distracted.  Some anger that she has held inside and she share some of that in her talking today. *  (homework) Looked at strategies and goals related to her grief and depression and encouraged patient to look "for what may go right instead of wrong, go looking for the positives."   Diagnoses:    ICD-10-CM   1. Mood disorder ([place])  F39    Areas to follow up on more next visit:  Anxiety:                           Depression                                                                                            Boundaries                                                                                      Relationships             Communication           Work/Life balance                    Problem-solving             Coping srategies   Goal-oriented tasks:  (follow up on next visit)  1. Maintains mood stabiity:  decreased symptoms of     depression     anxiety  2.   Practices pro-active self-care:   restful  sleep, nutrition, exercise, socialization  3.   Effective utilizes boundaries and sets limits  4.   Utliizes coping strategies and problem solving techniques for stress management  5.   Feels accurately heard, understood and validated  Other: Grief therapy    [name], LCSW

## 2019-03-30 ENCOUNTER — Other Ambulatory Visit: Payer: Self-pay

## 2019-03-30 ENCOUNTER — Ambulatory Visit (INDEPENDENT_AMBULATORY_CARE_PROVIDER_SITE_OTHER): Payer: 59 | Admitting: Psychiatry

## 2019-03-30 DIAGNOSIS — R69 Illness, unspecified: Secondary | ICD-10-CM | POA: Diagnosis not present

## 2019-03-30 DIAGNOSIS — F39 Unspecified mood [affective] disorder: Secondary | ICD-10-CM | POA: Diagnosis not present

## 2019-03-30 NOTE — Progress Notes (Signed)
      [facility] Counselor/Therapist Progress Note  Patient ID: Ashley Stephens, MRN: [id number],    Date: 03/30/2019  Time Spent: 60 minutes    8:00am to 9:00am  Treatment Type: Individual Therapy  Reported Symptoms: anxiety, depression, "still grieving aunt's death in 12/10/22- like a mom to me", feeling hopeless at times but denies any SI  Mental Status Exam:  Appearance:   Casual     Behavior:  Appropriate and Sharing  Motor:  Normal  Speech/Language:   Clear and Coherent  Affect:  anxiety  Mood:  anxious  Thought process:  normal  Thought content:    goal directed  Sensory/Perceptual disturbances:    WNL  Orientation:  oriented to person, place, time/date, situation, day of week, month of year and year  Attention:  Good  Concentration:  Good  Memory:  WNL  Fund of knowledge:   Good  Insight:    Good  Judgment:   Good  Impulse Control:  Good   Risk Assessment: Danger to Self:  No Self-injurious Behavior: No Danger to Others: No Duty to Warn:no Physical Aggression / Violence:No  Access to Firearms a concern: No  Gang Involvement:No   Subjective:  Patient reports today she is still experiencing anxiety, depression, and some grief issues re: loss of aunt who was like a mom to patient. Shares that she "has not patience and needs to work on that."  Reviewed homework re: beginning habit of looking for the positives vs negatives only. Working to change her mindset to be more motivating and positive and able to have more patience in "every area of my life."  Mood is better today with less depression. Met a friend (29, who had lost wife I recent past) and has spent some time together that has felt good, "but just friends." Shared how she caught herself many times looking for what may go wrong versus right----but did work to change that [place] and work to look for positives.  Long history of being anxious and anticipating the worst.  Issue in relationship with 45 yr old son  always leaning on her.  "He has some of the same issues I have."  50 yr old daughter is a [employer] and is more self-reliant.  Looked at some strategies to help her with developing more positive mindset, making better decisions, and managing anxiety and depression, and well as continuing to work on her grief over aunt's recent death.  Homework includes continued work on becoming more positive in her outlook and setting appropriate boundaries/limits with adult son.    Interventions: Cognitive Behavioral Therapy and Solution-Oriented/Positive Psychology  Diagnosis:   ICD-10-CM   1. Mood disorder ([place])  F39     Plan:   Plan to follow through again on homework and focus on good self care.  Especially focus on her automatic thoughts.  Goals discussed with patient (noted in [place]) and she is feeling much more motivated today. Return in 2 wks.  [name], LCSW

## 2019-04-03 ENCOUNTER — Ambulatory Visit (INDEPENDENT_AMBULATORY_CARE_PROVIDER_SITE_OTHER): Payer: 59 | Admitting: Psychiatry

## 2019-04-03 ENCOUNTER — Encounter: Payer: Self-pay | Admitting: Psychiatry

## 2019-04-03 ENCOUNTER — Other Ambulatory Visit: Payer: Self-pay

## 2019-04-03 DIAGNOSIS — F411 Generalized anxiety disorder: Secondary | ICD-10-CM

## 2019-04-03 DIAGNOSIS — F39 Unspecified mood [affective] disorder: Secondary | ICD-10-CM

## 2019-04-03 DIAGNOSIS — R69 Illness, unspecified: Secondary | ICD-10-CM | POA: Diagnosis not present

## 2019-04-03 DIAGNOSIS — F5101 Primary insomnia: Secondary | ICD-10-CM | POA: Diagnosis not present

## 2019-04-03 MED ORDER — HYDROXYZINE PAMOATE 50 MG PO CAPS: ORAL_CAPSULE | ORAL | 0 refills | Status: DC

## 2019-04-03 MED ORDER — PROPRANOLOL HCL 20 MG PO TABS: 20.0000 mg | ORAL_TABLET | Freq: Two times a day (BID) | ORAL | 1 refills | Status: DC | PRN

## 2019-04-03 MED ORDER — LAMOTRIGINE 150 MG PO TABS: 150.0000 mg | ORAL_TABLET | Freq: Two times a day (BID) | ORAL | 1 refills | Status: DC

## 2019-04-03 MED ORDER — OXCARBAZEPINE 300 MG PO TABS: 300.0000 mg | ORAL_TABLET | Freq: Every day | ORAL | 0 refills | Status: DC

## 2019-04-03 NOTE — Progress Notes (Signed)
Ashley Stephens [id number] 21-Mar-1976 43 y.o.  Subjective:   Patient ID:  Ashley Stephens is a 1 y.o. (DOB 11/11/1975) female.  Chief Complaint:  Chief Complaint  Patient presents with  . Follow-up    Depression, Grief, Anxiety, and Insomnia    HPI Ashley Stephens presents to the office today for follow-up of anxiety and mood disturbance. She reports that 03-29-2025 was her deceased aunt's birthday and "went into a downward spiral" and spent most of her time lying in the bed and experiencing severe depression and grief. She reports that her mood seems to be improving some. Reports that she has had to push herself to get out of bed. Reports low energy and motivation. She reports that her anxiety was worse with return to work. Reports that she worries about how her supervisors and coworkers perceive her. She reports anxiety with answering her calls at work and wondering what type of caller she might have and what she may be faced with. Denies panic attacks. She reports that her sleep has improved with medication and new bed. She reports that appetite is normal and eating when she is hungry and no longer emotional eating. She reports concentration has been poor. Reports that she had some suicidal thoughts around her aunt's birthday. Denies current SI.   Started seeing [name], LCSW and reports that this has been helpful for her.    Review of Systems:  Review of Systems  Gastrointestinal: Negative.   Musculoskeletal: Negative for gait problem.  Neurological: Negative for tremors and headaches.  Psychiatric/Behavioral:       Please refer to HPI    Medications: I have reviewed the patient's current medications.  Current Outpatient Medications  Medication Sig Dispense Refill  . ALPRAZolam (XANAX) 0.25 MG tablet Take 1 tablet (0.25 mg total) by mouth 2 (two) times daily as needed for up to 30 days for anxiety. 45 tablet 2  . desvenlafaxine (PRISTIQ) 100 MG 24 hr tablet  Take 1 tablet (100 mg total) by mouth every morning. 90 tablet 1  . desvenlafaxine (PRISTIQ) 50 MG 24 hr tablet Take 1 tablet (50 mg total) by mouth every evening. 90 tablet 1  . levothyroxine (SYNTHROID, LEVOTHROID) 25 MCG tablet Take 1 tablet (25 mcg total) by mouth daily before breakfast. 30 tablet 2  . omeprazole (PRILOSEC) 20 MG capsule Take 1 capsule (20 mg total) by mouth 2 (two) times daily before a meal. (Patient taking differently: Take 20 mg by mouth daily as needed. ) 60 capsule 0  . hydrOXYzine (VISTARIL) 50 MG capsule Take 1 capsule by mouth three times daily as needed 90 capsule 0  . lamoTRIgine (LAMICTAL) 150 MG tablet Take 1 tablet (150 mg total) by mouth 2 (two) times daily. 180 tablet 1  . lurasidone (LATUDA) 80 MG TABS tablet Take 1 tablet (80 mg total) by mouth daily with breakfast for 30 days. 30 tablet 3  . Oxcarbazepine (TRILEPTAL) 300 MG tablet Take 1 tablet (300 mg total) by mouth at bedtime. 90 tablet 0  . propranolol (INDERAL) 20 MG tablet Take 1 tablet (20 mg total) by mouth 2 (two) times daily as needed. 180 tablet 1  . sucralfate (CARAFATE) 1 GM/10ML suspension Take 10 mLs (1 g total) by mouth 4 (four) times daily -  with meals and at bedtime. (Patient not taking: Reported on 11/14/2018) 420 mL 0   No current facility-administered medications for this visit.     Medication Side Effects: None  Allergies: No Known  Allergies  Past Medical History:  Diagnosis Date  . Anxiety   . Depression   . Gastric ulcer   . GERD (gastroesophageal reflux disease)   . Hypertension   . Thyroid disorder     Family History  Problem Relation Age of Onset  . Breast cancer Maternal Grandmother     Social History   Socioeconomic History  . Marital status: Single    Spouse name: Not on file  . Number of children: Not on file  . Years of education: Not on file  . Highest education level: Not on file  Occupational History  . Not on file  Social Needs  . Financial resource  strain: Not on file  . Food insecurity    Worry: Not on file    Inability: Not on file  . Transportation needs    Medical: Not on file    Non-medical: Not on file  Tobacco Use  . Smoking status: Never Smoker  . Smokeless tobacco: Never Used  Substance and Sexual Activity  . Alcohol use: No    Alcohol/week: 0.0 standard drinks  . Drug use: No  . Sexual activity: Yes    Birth control/protection: Surgical  Lifestyle  . Physical activity    Days per week: Not on file    Minutes per session: Not on file  . Stress: Not on file  Relationships  . Social Herbalist on phone: Not on file    Gets together: Not on file    Attends religious service: Not on file    Active member of club or organization: Not on file    Attends meetings of clubs or organizations: Not on file    Relationship status: Not on file  . Intimate partner violence    Fear of current or ex partner: Not on file    Emotionally abused: Not on file    Physically abused: Not on file    Forced sexual activity: Not on file  Other Topics Concern  . Not on file  Social History Narrative  . Not on file    Past Medical History, Surgical history, Social history, and Family history were reviewed and updated as appropriate.   Please see review of systems for further details on the patient's review from today.   Objective:   Physical Exam:  BP 133/84   Pulse 91   Physical Exam Constitutional:      General: She is not in acute distress.    Appearance: She is well-developed.  Musculoskeletal:        General: No deformity.  Neurological:     Mental Status: She is alert and oriented to person, place, and time.     Coordination: Coordination normal.  Psychiatric:        Attention and Perception: Attention and perception normal. She does not perceive auditory or visual hallucinations.        Mood and Affect: Affect is not labile, blunt, angry or inappropriate.        Speech: Speech normal.        Behavior:  Behavior normal.        Thought Content: Thought content normal. Thought content does not include homicidal or suicidal ideation. Thought content does not include homicidal or suicidal plan.        Cognition and Memory: Cognition and memory normal.        Judgment: Judgment normal.     Comments: Mood is appropriate to content.  Affect is congruent.  Insight intact. No delusions.      Lab Review:     Component Value Date/Time   NA 137 02/05/2018 1045   NA 138 12/27/2017 0740   NA 142 12/03/2011 1059   K 3.7 02/05/2018 1045   K 3.4 (L) 12/03/2011 1059   CL 104 02/05/2018 1045   CL 107 12/03/2011 1059   CO2 26 02/05/2018 1045   CO2 27 12/03/2011 1059   GLUCOSE 105 (H) 02/05/2018 1045   GLUCOSE 66 12/03/2011 1059   BUN 8 02/05/2018 1045   BUN 10 12/27/2017 0740   BUN 4 (L) 12/03/2011 1059   CREATININE 0.79 02/05/2018 1045   CREATININE 0.82 12/03/2011 1059   CALCIUM 9.0 02/05/2018 1045   CALCIUM 8.5 12/03/2011 1059   PROT 6.7 02/05/2018 1045   PROT 6.7 12/27/2017 0740   ALBUMIN 3.7 02/05/2018 1045   ALBUMIN 4.2 12/27/2017 0740   AST 19 02/05/2018 1045   ALT 16 02/05/2018 1045   ALKPHOS 61 02/05/2018 1045   BILITOT 0.2 (L) 02/05/2018 1045   BILITOT 0.2 12/27/2017 0740   GFRNONAA >60 02/05/2018 1045   GFRNONAA >60 12/03/2011 1059   GFRAA >60 02/05/2018 1045   GFRAA >60 12/03/2011 1059       Component Value Date/Time   WBC 4.7 02/05/2018 1045   RBC 4.65 02/05/2018 1045   HGB 13.4 02/05/2018 1045   HGB 13.9 12/27/2017 0740   HCT 39.5 02/05/2018 1045   HCT 40.9 12/27/2017 0740   PLT 294 02/05/2018 1045   PLT 331 12/27/2017 0740   MCV 84.9 02/05/2018 1045   MCV 85 12/27/2017 0740   MCH 28.8 02/05/2018 1045   MCHC 33.9 02/05/2018 1045   RDW 12.7 02/05/2018 1045   RDW 13.3 12/27/2017 0740   LYMPHSABS 1.4 02/05/2018 1045   LYMPHSABS 1.8 12/27/2017 0740   MONOABS 0.4 02/05/2018 1045   EOSABS 0.1 02/05/2018 1045   EOSABS 0.1 12/27/2017 0740   BASOSABS 0.0 02/05/2018  1045   BASOSABS 0.0 12/27/2017 0740    No results found for: POCLITH, LITHIUM   No results found for: PHENYTOIN, PHENOBARB, VALPROATE, CBMZ   .res Assessment: Plan:   Will continue current medications since patient reports that medications have been helpful for target signs and symptoms without significant tolerability issues. Recommend continuing psychotherapy with [name], LCSW. Patient to follow-up with this provider in 2 months or sooner if clinically indicated. Patient advised to contact office with any questions, adverse effects, or acute worsening in signs and symptoms.  Elfie was seen today for follow-up.  Diagnoses and all orders for this visit:  Mood disorder ([place]) -     lamoTRIgine (LAMICTAL) 150 MG tablet; Take 1 tablet (150 mg total) by mouth 2 (two) times daily. -     Oxcarbazepine (TRILEPTAL) 300 MG tablet; Take 1 tablet (300 mg total) by mouth at bedtime.  Generalized anxiety disorder -     Oxcarbazepine (TRILEPTAL) 300 MG tablet; Take 1 tablet (300 mg total) by mouth at bedtime. -     propranolol (INDERAL) 20 MG tablet; Take 1 tablet (20 mg total) by mouth 2 (two) times daily as needed.  Primary insomnia -     Oxcarbazepine (TRILEPTAL) 300 MG tablet; Take 1 tablet (300 mg total) by mouth at bedtime. -     hydrOXYzine (VISTARIL) 50 MG capsule; Take 1 capsule by mouth three times daily as needed     Please see After Visit Summary for patient specific instructions.  Future Appointments  Date Time  Provider [place]  04/17/2019  8:00 AM [name], LCSW [facility] None  05/01/2019  8:00 AM [name], LCSW [facility] None  05/15/2019  8:00 AM [name], LCSW [facility] None  05/29/2019  8:00 AM [name], LCSW [facility] None  05/29/2019  9:00 AM [name], PMHNP [facility] None  06/12/2019  8:00 AM [name], LCSW [facility] None  06/26/2019  9:00 AM Kendell Bane, NP [facility] None  06/26/2019  3:00 PM [name], LCSW [facility] None    No orders of  the defined types were placed in this encounter.   -------------------------------

## 2019-04-10 DIAGNOSIS — B373 Candidiasis of vulva and vagina: Secondary | ICD-10-CM | POA: Diagnosis not present

## 2019-04-10 DIAGNOSIS — N760 Acute vaginitis: Secondary | ICD-10-CM | POA: Diagnosis not present

## 2019-04-17 ENCOUNTER — Ambulatory Visit: Payer: 59 | Admitting: Psychiatry

## 2019-04-20 DIAGNOSIS — Z20828 Contact with and (suspected) exposure to other viral communicable diseases: Secondary | ICD-10-CM | POA: Diagnosis not present

## 2019-04-23 DIAGNOSIS — L700 Acne vulgaris: Secondary | ICD-10-CM | POA: Diagnosis not present

## 2019-05-01 ENCOUNTER — Ambulatory Visit (INDEPENDENT_AMBULATORY_CARE_PROVIDER_SITE_OTHER): Payer: 59 | Admitting: Psychiatry

## 2019-05-01 ENCOUNTER — Other Ambulatory Visit: Payer: Self-pay

## 2019-05-01 DIAGNOSIS — R69 Illness, unspecified: Secondary | ICD-10-CM | POA: Diagnosis not present

## 2019-05-01 DIAGNOSIS — F39 Unspecified mood [affective] disorder: Secondary | ICD-10-CM | POA: Diagnosis not present

## 2019-05-01 NOTE — Progress Notes (Signed)
[facility] Counselor/Therapist Progress Note  Patient ID: Ashley Stephens, MRN: [id number],    Date: 05/01/2019  Time Spent: 60 minutes    8:00am to 9:00am  Treatment Type: Individual Therapy  Reported Symptoms: anxiety, depression, "still grieving aunt's death in 2022/11/10- like a mom to me", frustrated, relationship concerns  Mental Status Exam:  Appearance:   Casual     Behavior:  Appropriate and Sharing  Motor:  Normal  Speech/Language:   Clear and Coherent  Affect:  anxiety  Mood:  anxious  Thought process:  normal  Thought content:    goal directed  Sensory/Perceptual disturbances:    WNL  Orientation:  oriented to person, place, time/date, situation, day of week, month of year and year  Attention:  Good  Concentration:  Good  Memory:  WNL  Fund of knowledge:   Good  Insight:    Good  Judgment:   Good  Impulse Control:  Good   Risk Assessment: Danger to Self:  No Self-injurious Behavior: No Danger to Others: No Duty to Warn:no Physical Aggression / Violence:No  Access to Firearms a concern: No  Gang Involvement:No   Subjective:   Patient reports today she is struggling with anxiety, frustration, depression, grief, and relationship concerns with someone she met recently. Feels that her anxiety and depression are mostly from history of family relationships that were very difficult.  Recent current relationship concerns has escalated and other person is pushing beyond limits patient set with them.  Also still working on her grief in loss of her aunt "(like a mom) that raised me for 22 yrs."    Today shared and processed difficulties in current relationship where several concerns exist. Talked about these at length.  Also looked at strategies for her to use in communicating with person as she is wanting to end the relationship, and other person is not respecting this. Communication techniques reviewed and practiced in session.  Patient also knows how to contact  police if needed for any potential safety concerns. Feeling much less stressed after talking through her feelings and concerns, in addition to setting better boundaries and not unintended mixed-messages, and how to eventually move forward. To follow up on this more next session, in addition to follow up on grief over loss of aunt that raised her.  Reviewed homework, which she is to continue, re: beginning habit of looking for the positives vs negatives only. Patient is working to change her mindset to be more motivating and positive and able to have more patience in "every area of my life."  Also to continue setting boundaries with son.  Interventions: Cognitive Behavioral Therapy and Solution-Oriented/Positive Psychology  Diagnosis:   ICD-10-CM   1. Mood disorder ([place])  F39     Plan:   Plan to follow through again on homework and focus also on good self care.  Especially focus on her automatic thoughts.  Goals discussed with patient (noted in [place]) and she is feeling much more motivated today. Return in 2 wks.  [name], LCSW

## 2019-05-04 DIAGNOSIS — L700 Acne vulgaris: Secondary | ICD-10-CM | POA: Diagnosis not present

## 2019-05-15 ENCOUNTER — Other Ambulatory Visit: Payer: Self-pay

## 2019-05-15 ENCOUNTER — Ambulatory Visit (INDEPENDENT_AMBULATORY_CARE_PROVIDER_SITE_OTHER): Payer: 59 | Admitting: Psychiatry

## 2019-05-15 DIAGNOSIS — R69 Illness, unspecified: Secondary | ICD-10-CM | POA: Diagnosis not present

## 2019-05-15 DIAGNOSIS — F39 Unspecified mood [affective] disorder: Secondary | ICD-10-CM

## 2019-05-15 NOTE — Progress Notes (Signed)
        [facility] Counselor/Therapist Progress Note  Patient ID: Ashley Stephens, MRN: [id number],    Date: 05/15/2019  Time Spent:  35 minutes    8:00am to 8:35am (patient couldn't stay for full appt due to car at repair shop)  Treatment Type: Individual Therapy  Reported Symptoms: anxiety, depression, frustration, anger, work issues  Mental Status Exam:  Appearance:   Casual     Behavior:  Sharing and Agitated  Motor:  Normal  Speech/Language:   Clear and Coherent  Affect:  anxious, angry, frustrated  Mood:  anxious, depressed and irritable  Thought process:  normal  Thought content:    WNL  Sensory/Perceptual disturbances:    WNL  Orientation:  oriented to person, place, time/date, situation, day of week, month of year and year  Attention:  Good  Concentration:  Good  Memory:  WNL  Fund of knowledge:   Good  Insight:    Fair  Judgment:   Good  Impulse Control:  Good   Risk Assessment: Danger to Self:  No Self-injurious Behavior: No Danger to Others: No Duty to Warn:no Physical Aggression / Violence:No  Access to Firearms a concern: No  Gang Involvement:No   Subjective: Patient frustrated, angry, having issues at work and at home with daughter.  Angry "other people expect me to be there for them but they are not there for me. Conflicts with work Probation officer. Ongoing conflict with daughter and states that they really need to live apart, and seems to be considering that. Discussed calming strategies for patient as well as setting appropriate limits with daughter.  Also worked with her on improved communication, to be better heard, especially in the midst of conflict.  Interventions: Assertiveness/Communication and Solution-Oriented/Positive Psychology  Diagnosis:   ICD-10-CM   1. Mood disorder ([place])  F39     Plan:   Treatment Goals: Patient not signing tx plan on computer screen due to [place].  Long term goal: Develop the ability to recognize, accept,  and cope with feelings of depression and anxiety.  Progressing-patient motivated but due to her anger and frustration today it was hard for her to focus on this.  Short term goal: Learn and implement personal skills for managing stress, solving daily problems, and resolving conflicts effectively.  Strategy: Teach patient calming strategies, problem solving skills, and conflict resolution skills to better manage daily stressors.  Progressing-patient motivated; just established these 2 goals today.   Next appt within 2 wks.  Patient states she may call for an earlier time. Denies any SI or HI.   [name], LCSW

## 2019-05-29 ENCOUNTER — Ambulatory Visit (INDEPENDENT_AMBULATORY_CARE_PROVIDER_SITE_OTHER): Payer: 59 | Admitting: Psychiatry

## 2019-05-29 ENCOUNTER — Other Ambulatory Visit: Payer: Self-pay

## 2019-05-29 ENCOUNTER — Encounter: Payer: Self-pay | Admitting: Psychiatry

## 2019-05-29 DIAGNOSIS — F39 Unspecified mood [affective] disorder: Secondary | ICD-10-CM

## 2019-05-29 DIAGNOSIS — F5101 Primary insomnia: Secondary | ICD-10-CM

## 2019-05-29 DIAGNOSIS — R202 Paresthesia of skin: Secondary | ICD-10-CM | POA: Diagnosis not present

## 2019-05-29 DIAGNOSIS — R000 Tachycardia, unspecified: Secondary | ICD-10-CM | POA: Diagnosis not present

## 2019-05-29 DIAGNOSIS — F411 Generalized anxiety disorder: Secondary | ICD-10-CM | POA: Diagnosis not present

## 2019-05-29 DIAGNOSIS — R42 Dizziness and giddiness: Secondary | ICD-10-CM | POA: Diagnosis not present

## 2019-05-29 DIAGNOSIS — R079 Chest pain, unspecified: Secondary | ICD-10-CM | POA: Diagnosis not present

## 2019-05-29 DIAGNOSIS — Z9884 Bariatric surgery status: Secondary | ICD-10-CM | POA: Diagnosis not present

## 2019-05-29 DIAGNOSIS — M25512 Pain in left shoulder: Secondary | ICD-10-CM | POA: Diagnosis not present

## 2019-05-29 DIAGNOSIS — R0789 Other chest pain: Secondary | ICD-10-CM | POA: Diagnosis not present

## 2019-05-29 DIAGNOSIS — R69 Illness, unspecified: Secondary | ICD-10-CM | POA: Diagnosis not present

## 2019-05-29 DIAGNOSIS — H9319 Tinnitus, unspecified ear: Secondary | ICD-10-CM | POA: Diagnosis not present

## 2019-05-29 DIAGNOSIS — Z87891 Personal history of nicotine dependence: Secondary | ICD-10-CM | POA: Diagnosis not present

## 2019-05-29 DIAGNOSIS — H9190 Unspecified hearing loss, unspecified ear: Secondary | ICD-10-CM | POA: Diagnosis not present

## 2019-05-29 MED ORDER — LURASIDONE HCL 80 MG PO TABS: 80.0000 mg | ORAL_TABLET | Freq: Every day | ORAL | 3 refills | Status: DC

## 2019-05-29 MED ORDER — OXCARBAZEPINE 300 MG PO TABS: 300.0000 mg | ORAL_TABLET | Freq: Every day | ORAL | 0 refills | Status: DC

## 2019-05-29 MED ORDER — HYDROXYZINE PAMOATE 50 MG PO CAPS: ORAL_CAPSULE | ORAL | 0 refills | Status: DC

## 2019-05-29 MED ORDER — DESVENLAFAXINE SUCCINATE ER 100 MG PO TB24: 100.0000 mg | ORAL_TABLET | ORAL | 1 refills | Status: DC

## 2019-05-29 MED ORDER — ALPRAZOLAM 0.25 MG PO TABS: 0.2500 mg | ORAL_TABLET | Freq: Two times a day (BID) | ORAL | 2 refills | Status: DC | PRN

## 2019-05-29 MED ORDER — PROPRANOLOL HCL 20 MG PO TABS: 20.0000 mg | ORAL_TABLET | Freq: Three times a day (TID) | ORAL | 1 refills | Status: DC | PRN

## 2019-05-29 MED ORDER — DESVENLAFAXINE SUCCINATE ER 50 MG PO TB24: 50.0000 mg | ORAL_TABLET | Freq: Every evening | ORAL | 1 refills | Status: DC

## 2019-05-29 NOTE — Progress Notes (Signed)
   05/29/19 0915  Facial and Oral Movements  Muscles of Facial Expression 0  Lips and Perioral Area 0  Jaw 0  Tongue 0  Extremity Movements  Upper (arms, wrists, hands, fingers) 0  Lower (legs, knees, ankles, toes) 0  Trunk Movements  Neck, shoulders, hips 0  Overall Severity  Severity of abnormal movements (highest score from questions above) 0  Incapacitation due to abnormal movements 0  Patient's awareness of abnormal movements (rate only patient's report) 0  AIMS Total Score  AIMS Total Score 0

## 2019-05-29 NOTE — Progress Notes (Signed)
Trenny Stenseth [id number] 1975/11/28 43 y.o.  Subjective:   Patient ID:  Ashley Stephens is a 25 y.o. (DOB 06-13-1976) female.  Chief Complaint:  Chief Complaint  Patient presents with  . Anxiety  . Depression    HPI [name] presents to the office today for follow-up of mood, anxiety, and insomnia. She reports that she has been experiencing significant anxiety and some depression. Denies any recent panic attacks. She reports feeling nervous and uneasy. Describes mood as depressed and "blah." She reports some irritation in response to not feeling well. Reports energy is low. Motivation has been lower the last 2 weeks. She reports weight gain. Reports that her appetite is up and down. Has not been sleeping well. Going to bed later and having difficulty falling asleep. Occ middle of the night awakenings. Denies nightmares. Concentration varies "depending on the day, depending on what is going on." Denies risky or impulsive behavior. Denies SI.   She reports that propranolol has been helpful for her anxiety.   Work schedule is Sunday-Thursday. Denies any significant changes in work or home life.   Past medication trials: Wellbutrin Effexor Pristiq-effective Lamictal-effective for mood Latuda Xanax- Usually takes at night Propanolol- Effective Hydroxyzine Rexulti Gabapentin- prescribed for back pain. Dizziness, headaches.  Lithium Trileptal   Review of Systems:  Review of Systems  Constitutional: Positive for fatigue.  Gastrointestinal:       Reports pain on right side  Musculoskeletal: Positive for back pain. Negative for gait problem.  Neurological: Positive for headaches. Negative for tremors.       Vertigo  Psychiatric/Behavioral:       Please refer to HPI    Medications: I have reviewed the patient's current medications.  Current Outpatient Medications  Medication Sig Dispense Refill  . desvenlafaxine (PRISTIQ) 100 MG 24 hr tablet Take  1 tablet (100 mg total) by mouth every morning. 90 tablet 1  . desvenlafaxine (PRISTIQ) 50 MG 24 hr tablet Take 1 tablet (50 mg total) by mouth every evening. 90 tablet 1  . hydrOXYzine (VISTARIL) 50 MG capsule Take 1 capsule by mouth three times daily as needed 90 capsule 0  . lamoTRIgine (LAMICTAL) 150 MG tablet Take 1 tablet (150 mg total) by mouth 2 (two) times daily. 180 tablet 1  . levothyroxine (SYNTHROID, LEVOTHROID) 25 MCG tablet Take 1 tablet (25 mcg total) by mouth daily before breakfast. 30 tablet 2  . Oxcarbazepine (TRILEPTAL) 300 MG tablet Take 1 tablet (300 mg total) by mouth at bedtime. 90 tablet 0  . propranolol (INDERAL) 20 MG tablet Take 1 tablet (20 mg total) by mouth 3 (three) times daily as needed. 270 tablet 1  . [START ON 06/08/2019] ALPRAZolam (XANAX) 0.25 MG tablet Take 1 tablet (0.25 mg total) by mouth 2 (two) times daily as needed for anxiety. 45 tablet 2  . lurasidone (LATUDA) 80 MG TABS tablet Take 1 tablet (80 mg total) by mouth daily with breakfast. 30 tablet 3  . omeprazole (PRILOSEC) 20 MG capsule Take 1 capsule (20 mg total) by mouth 2 (two) times daily before a meal. (Patient not taking: Reported on 05/29/2019) 60 capsule 0  . sucralfate (CARAFATE) 1 GM/10ML suspension Take 10 mLs (1 g total) by mouth 4 (four) times daily -  with meals and at bedtime. (Patient not taking: Reported on 11/14/2018) 420 mL 0   No current facility-administered medications for this visit.     Medication Side Effects: None  Allergies: No Known Allergies  Past Medical  History:  Diagnosis Date  . Anxiety   . Depression   . Gastric ulcer   . GERD (gastroesophageal reflux disease)   . Hypertension   . Thyroid disorder     Family History  Problem Relation Age of Onset  . Breast cancer Maternal Grandmother     Social History   Socioeconomic History  . Marital status: Single    Spouse name: Not on file  . Number of children: Not on file  . Years of education: Not on file  .  Highest education level: Not on file  Occupational History  . Not on file  Social Needs  . Financial resource strain: Not on file  . Food insecurity    Worry: Not on file    Inability: Not on file  . Transportation needs    Medical: Not on file    Non-medical: Not on file  Tobacco Use  . Smoking status: Never Smoker  . Smokeless tobacco: Never Used  Substance and Sexual Activity  . Alcohol use: No    Alcohol/week: 0.0 standard drinks  . Drug use: No  . Sexual activity: Yes    Birth control/protection: Surgical  Lifestyle  . Physical activity    Days per week: Not on file    Minutes per session: Not on file  . Stress: Not on file  Relationships  . Social Herbalist on phone: Not on file    Gets together: Not on file    Attends religious service: Not on file    Active member of club or organization: Not on file    Attends meetings of clubs or organizations: Not on file    Relationship status: Not on file  . Intimate partner violence    Fear of current or ex partner: Not on file    Emotionally abused: Not on file    Physically abused: Not on file    Forced sexual activity: Not on file  Other Topics Concern  . Not on file  Social History Narrative  . Not on file    Past Medical History, Surgical history, Social history, and Family history were reviewed and updated as appropriate.   Please see review of systems for further details on the patient's review from today.   Objective:   Physical Exam:  BP 136/86   Pulse 82   Physical Exam Constitutional:      General: She is not in acute distress.    Appearance: She is well-developed.  Musculoskeletal:        General: No deformity.  Neurological:     Mental Status: She is alert and oriented to person, place, and time.     Coordination: Coordination normal.  Psychiatric:        Attention and Perception: Attention and perception normal. She does not perceive auditory or visual hallucinations.        Mood  and Affect: Mood is anxious and depressed. Affect is not labile, blunt, angry or inappropriate.        Speech: Speech normal.        Behavior: Behavior normal.        Thought Content: Thought content normal. Thought content is not paranoid or delusional. Thought content does not include homicidal or suicidal ideation. Thought content does not include homicidal or suicidal plan.        Cognition and Memory: Cognition and memory normal.        Judgment: Judgment normal.     Comments: Dysphoric  mood Insight intact. No delusions.      Lab Review:     Component Value Date/Time   NA 137 02/05/2018 1045   NA 138 12/27/2017 0740   NA 142 12/03/2011 1059   K 3.7 02/05/2018 1045   K 3.4 (L) 12/03/2011 1059   CL 104 02/05/2018 1045   CL 107 12/03/2011 1059   CO2 26 02/05/2018 1045   CO2 27 12/03/2011 1059   GLUCOSE 105 (H) 02/05/2018 1045   GLUCOSE 66 12/03/2011 1059   BUN 8 02/05/2018 1045   BUN 10 12/27/2017 0740   BUN 4 (L) 12/03/2011 1059   CREATININE 0.79 02/05/2018 1045   CREATININE 0.82 12/03/2011 1059   CALCIUM 9.0 02/05/2018 1045   CALCIUM 8.5 12/03/2011 1059   PROT 6.7 02/05/2018 1045   PROT 6.7 12/27/2017 0740   ALBUMIN 3.7 02/05/2018 1045   ALBUMIN 4.2 12/27/2017 0740   AST 19 02/05/2018 1045   ALT 16 02/05/2018 1045   ALKPHOS 61 02/05/2018 1045   BILITOT 0.2 (L) 02/05/2018 1045   BILITOT 0.2 12/27/2017 0740   GFRNONAA >60 02/05/2018 1045   GFRNONAA >60 12/03/2011 1059   GFRAA >60 02/05/2018 1045   GFRAA >60 12/03/2011 1059       Component Value Date/Time   WBC 4.7 02/05/2018 1045   RBC 4.65 02/05/2018 1045   HGB 13.4 02/05/2018 1045   HGB 13.9 12/27/2017 0740   HCT 39.5 02/05/2018 1045   HCT 40.9 12/27/2017 0740   PLT 294 02/05/2018 1045   PLT 331 12/27/2017 0740   MCV 84.9 02/05/2018 1045   MCV 85 12/27/2017 0740   MCH 28.8 02/05/2018 1045   MCHC 33.9 02/05/2018 1045   RDW 12.7 02/05/2018 1045   RDW 13.3 12/27/2017 0740   LYMPHSABS 1.4 02/05/2018  1045   LYMPHSABS 1.8 12/27/2017 0740   MONOABS 0.4 02/05/2018 1045   EOSABS 0.1 02/05/2018 1045   EOSABS 0.1 12/27/2017 0740   BASOSABS 0.0 02/05/2018 1045   BASOSABS 0.0 12/27/2017 0740    No results found for: POCLITH, LITHIUM   No results found for: PHENYTOIN, PHENOBARB, VALPROATE, CBMZ   .res Assessment: Plan:   Will increase propanolol to 20 mg 3 times daily as needed and advised patient to resume propanolol since she reports running out of propanolol about 2 weeks ago and headache and vertigo beginning not long after she stopped taking propanolol.  Encourage patient to follow-up with medical provider if headache and vertigo do not improve. Recommend continuing all other medications as prescribed. Recommend continuing psychotherapy with [name], LCSW. Patient to follow-up with this provider in 1 month or sooner if clinically indicated. Patient advised to contact office with any questions, adverse effects, or acute worsening in signs and symptoms.  Vega was seen today for anxiety and depression.  Diagnoses and all orders for this visit:  Generalized anxiety disorder -     propranolol (INDERAL) 20 MG tablet; Take 1 tablet (20 mg total) by mouth 3 (three) times daily as needed. -     ALPRAZolam (XANAX) 0.25 MG tablet; Take 1 tablet (0.25 mg total) by mouth 2 (two) times daily as needed for anxiety. -     desvenlafaxine (PRISTIQ) 100 MG 24 hr tablet; Take 1 tablet (100 mg total) by mouth every morning. -     desvenlafaxine (PRISTIQ) 50 MG 24 hr tablet; Take 1 tablet (50 mg total) by mouth every evening. -     Oxcarbazepine (TRILEPTAL) 300 MG tablet; Take 1 tablet (300  mg total) by mouth at bedtime.  Mood disorder (HCC) -     desvenlafaxine (PRISTIQ) 100 MG 24 hr tablet; Take 1 tablet (100 mg total) by mouth every morning. -     desvenlafaxine (PRISTIQ) 50 MG 24 hr tablet; Take 1 tablet (50 mg total) by mouth every evening. -     lurasidone (LATUDA) 80 MG TABS tablet; Take 1  tablet (80 mg total) by mouth daily with breakfast. -     Oxcarbazepine (TRILEPTAL) 300 MG tablet; Take 1 tablet (300 mg total) by mouth at bedtime.  Primary insomnia -     hydrOXYzine (VISTARIL) 50 MG capsule; Take 1 capsule by mouth three times daily as needed -     Oxcarbazepine (TRILEPTAL) 300 MG tablet; Take 1 tablet (300 mg total) by mouth at bedtime.     Please see After Visit Summary for patient specific instructions.  Future Appointments  Date Time Provider [place]  06/12/2019  8:00 AM [name], LCSW [facility] None  06/26/2019  9:00 AM Ronnell Freshwater, NP [facility] None  06/26/2019  3:00 PM [name], LCSW [facility] None  07/03/2019  9:15 AM [name], PMHNP [facility] None  07/10/2019  8:00 AM [name], LCSW [facility] None  07/24/2019  8:00 AM [name], LCSW [facility] None    No orders of the defined types were placed in this encounter.   -------------------------------

## 2019-05-29 NOTE — Progress Notes (Signed)
      [facility] Counselor/Therapist Progress Note  Patient ID: Ashley Stephens, MRN: [id number],    Date: 05/29/2019  Time Spent: 60 minutes   8:00am to 9:00am  Treatment Type: Individual Therapy  Reported Symptoms: anxiety, frustration, depression  Mental Status Exam:  Appearance:   Casual     Behavior:  Appropriate and Sharing  Motor:  Normal  Speech/Language:   Clear and Coherent  Affect:  anxiety  Mood:  anxious  Thought process:  normal  Thought content:    WNL  Sensory/Perceptual disturbances:    WNL  Orientation:  oriented to person, place, time/date, situation, day of week, month of year and year  Attention:  Good  Concentration:  Good  Memory:  WNL  Fund of knowledge:   Good  Insight:    Good  Judgment:   Good  Impulse Control:  Good   Risk Assessment: Danger to Self:  No Self-injurious Behavior: No Danger to Others: No Duty to Warn:no Physical Aggression / Violence:No  Access to Firearms a concern: No  Gang Involvement:No   Subjective:   Patient anxious, irritable, some depression.  Anger and frustration with job and people there.  Feels she's not treated fairly because her issues involve "mental health" versus physical health. Has not heard any more from man with whom she had to set firm boundaries. Is having vertigo and is checking with her doctor on this.   Interventions: Cognitive Behavioral Therapy and Ego-Supportive  Diagnosis:   ICD-10-CM   1. Mood disorder ([place])  F39      Plan:  Patient not signing tx plan on computer screen due to [place].  Treatment Goals: Patient not signing tx plan on computer screen due to [place].  Long term goal: Develop the ability to recognize, accept, and cope with feelings of depression and anxiety.  Short term goal: Learn and implement personal skills for managing stress, solving daily problems, and resolving conflicts effectively.  Strategy: Teach patient calming strategies, problem solving  skills, and conflict resolution skills to better manage daily stressors.  Progressing- Working with patient on calming skills, self awareness, and self-regulation today due to her walking in the door very frustrated, with some anger, and feeling mistreated due to her mental health issues. Did become calmer in session.  Feels others have broken her down and admits that "I have contributed to it."  Talked about what she could do this weekend to continue working on the skills mentioned above and she states that she intentionally didn't make plans and should have a calmer weekend with more peace.  Mentioned her gratitude for things in her life which helps her to reflect on those things. Goal review with progress noted with patient.  Next appt within 2 weeks.   [name], LCSW

## 2019-06-04 DIAGNOSIS — L700 Acne vulgaris: Secondary | ICD-10-CM | POA: Diagnosis not present

## 2019-06-12 ENCOUNTER — Other Ambulatory Visit: Payer: Self-pay

## 2019-06-12 ENCOUNTER — Ambulatory Visit (INDEPENDENT_AMBULATORY_CARE_PROVIDER_SITE_OTHER): Payer: 59 | Admitting: Psychiatry

## 2019-06-12 DIAGNOSIS — F411 Generalized anxiety disorder: Secondary | ICD-10-CM

## 2019-06-12 DIAGNOSIS — R69 Illness, unspecified: Secondary | ICD-10-CM | POA: Diagnosis not present

## 2019-06-12 NOTE — Progress Notes (Signed)
      [facility] Counselor/Therapist Progress Note  Patient ID: Ashley Stephens, MRN: [id number],    Date: 06/12/2019  Time Spent: 60 minutes   8:00am to 9:00am  Treatment Type: Individual Therapy  Reported Symptoms: anxiety, frustration, some depression  Mental Status Exam:  Appearance:   Casual     Behavior:  Appropriate and Sharing  Motor:  Normal  Speech/Language:   Normal Rate  Affect:  anxious  Mood:  anxious and irritable  Thought process:  goal directed  Thought content:    WNL  Sensory/Perceptual disturbances:    WNL  Orientation:  oriented to person, place, time/date, situation, day of week, month of year and year  Attention:  Good  Concentration:  Good  Memory:  WNL  Fund of knowledge:   Good  Insight:    Good  Judgment:   Good  Impulse Control:  Good   Risk Assessment: Danger to Self:  No Self-injurious Behavior: No Danger to Others: No Duty to Warn:no Physical Aggression / Violence:No  Access to Firearms a concern: No  Gang Involvement:No   Subjective:  Patient in today reporting symptoms of anxiety, frustration, and some depression. Work remains stressful.  Things with daughter are up and down, but a bit better more recently.  Seems to know what she needs to do to handle work stressors more effectively.  Talked about what help and agreed that planning ahead and better self-talk would likely help.   Interventions: Cognitive Behavioral Therapy and Solution-Oriented/Positive Psychology  Diagnosis:   ICD-10-CM   1. Generalized anxiety disorder  F41.1     Plan:        Plan: Patient not signing tx plan on computer screen due to [place].  Treatment Goals: Patient not signing tx plan on computer screen due to [place].  Long term goal: Develop the ability to recognize, accept, and cope with feelings of depression and anxiety.  Short term goal: Learn and implement personal skills for managing stress, solving daily problems, and resolving  conflicts effectively.  Strategy: Teach patient calming strategies, problem solving skills, and conflict resolution skills to better manage daily stressors.  Progressing- Following up from last session, patient worked on increasing her self-awareness and ability to self-regulate when times are more tense for her and she feels overly emotional and stressed.  States that she's trying to have more self-control to modulate her intense emotions, being able to appropriately express them and still be grounded.  Also focusing on things in life that she is grateful for. Patient added before session end that she still is struggling some with some grief over death of her aunt, who was like a mother to her. Processed some of her feelings about her aunt and being without her here on earth now. Goal review and progress noted with patient.  Next appt within 2 weeks.   [name], LCSW

## 2019-06-26 ENCOUNTER — Ambulatory Visit (INDEPENDENT_AMBULATORY_CARE_PROVIDER_SITE_OTHER): Payer: 59 | Admitting: Psychiatry

## 2019-06-26 ENCOUNTER — Encounter: Payer: Self-pay | Admitting: Nurse Practitioner

## 2019-06-26 ENCOUNTER — Ambulatory Visit (INDEPENDENT_AMBULATORY_CARE_PROVIDER_SITE_OTHER): Payer: 59 | Admitting: Nurse Practitioner

## 2019-06-26 ENCOUNTER — Other Ambulatory Visit: Payer: Self-pay

## 2019-06-26 ENCOUNTER — Ambulatory Visit: Payer: 59 | Admitting: Psychiatry

## 2019-06-26 VITALS — BP 115/77 | HR 77 | Temp 96.6°F | Resp 16 | Ht 67.0 in | Wt 271.8 lb

## 2019-06-26 DIAGNOSIS — Z79899 Other long term (current) drug therapy: Secondary | ICD-10-CM

## 2019-06-26 DIAGNOSIS — F411 Generalized anxiety disorder: Secondary | ICD-10-CM | POA: Diagnosis not present

## 2019-06-26 DIAGNOSIS — R300 Dysuria: Secondary | ICD-10-CM

## 2019-06-26 DIAGNOSIS — R69 Illness, unspecified: Secondary | ICD-10-CM | POA: Diagnosis not present

## 2019-06-26 DIAGNOSIS — R7303 Prediabetes: Secondary | ICD-10-CM | POA: Diagnosis not present

## 2019-06-26 DIAGNOSIS — E1165 Type 2 diabetes mellitus with hyperglycemia: Secondary | ICD-10-CM

## 2019-06-26 DIAGNOSIS — Z1239 Encounter for other screening for malignant neoplasm of breast: Secondary | ICD-10-CM

## 2019-06-26 DIAGNOSIS — K219 Gastro-esophageal reflux disease without esophagitis: Secondary | ICD-10-CM | POA: Diagnosis not present

## 2019-06-26 DIAGNOSIS — E039 Hypothyroidism, unspecified: Secondary | ICD-10-CM

## 2019-06-26 DIAGNOSIS — Z0001 Encounter for general adult medical examination with abnormal findings: Secondary | ICD-10-CM | POA: Diagnosis not present

## 2019-06-26 LAB — POCT GLYCOSYLATED HEMOGLOBIN (HGB A1C): Hemoglobin A1C: 6.1 % — AB (ref 4.0–5.6)

## 2019-06-26 MED ORDER — OMEPRAZOLE 40 MG PO CPDR: 40.0000 mg | DELAYED_RELEASE_CAPSULE | Freq: Two times a day (BID) | ORAL | 3 refills | Status: DC

## 2019-06-26 MED ORDER — LEVOTHYROXINE SODIUM 25 MCG PO TABS: 25.0000 ug | ORAL_TABLET | Freq: Every day | ORAL | 2 refills | Status: DC

## 2019-06-26 NOTE — Progress Notes (Signed)
      [facility] Counselor/Therapist Progress Note  Patient ID: Ashley Stephens, MRN: [id number],    Date: 06/26/2019  Time Spent: 60 minutes   1:00pm to 2:00pm  Treatment Type: Individual Therapy   Reported Symptoms: anxiety, frustration, depression  Mental Status Exam:  Appearance:   Casual     Behavior:  Appropriate and Sharing  Motor:  Normal  Speech/Language:   Normal Rate  Affect:  anxious, depression  Mood:  anxious, depressed and frustration  Thought process:  goal directed  Thought content:    WNL  Sensory/Perceptual disturbances:    WNL  Orientation:  oriented to person, place, time/date, situation, day of week, month of year and year  Attention:  Good  Concentration:  Good  Memory:  WNL  Fund of knowledge:   Good  Insight:    Good  Judgment:   Good  Impulse Control:  Good   Risk Assessment: Danger to Self:  No Self-injurious Behavior: No Danger to Others: No Duty to Warn:no Physical Aggression / Violence:No  Access to Firearms a concern: No  Gang Involvement:No   Subjective:  Patient in today reporting anxiety, frustration, and depression.  Denies any SI.  Came in today frustrated, angry, short-tempered but is engaging with therapist to work on issues.  Interventions: Cognitive Behavioral Therapy and Solution-Oriented/Positive Psychology  Diagnosis:   ICD-10-CM   1. Generalized anxiety disorder  F41.1      Plan: Patient not signing tx plan on computer screen due to [place].  Treatment Goals: Patient not signing tx plan on computer screen due to [place].  Long term goal: Develop the ability to recognize, accept, and cope with feelings of depression and anxiety.  Short term goal: Learn and implement personal skills for managing stress, solving daily problems, and resolving conflicts effectively.  Strategy: Teach patient calming strategies, problem solving skills, and conflict resolution skills to better manage daily  stressors.  Progressing- Worked with patient in session today on her negative thoughts that she has been feeling most recently due to increased job stressors and she has been feeling excessive anxiety. Looked at what her thoughts have been more recently have included "work situation is too much and hopeless, I don't believe I can do this, and other self-negating thoughts."  Discussed how these thoughts are creating the feelings of sadness, hopelessness that she is feeling and that we need to work to help her change her thoughts.  Practiced interrupting her thoughts as she said them in session and together worked to replace them with more positive, realistic, and self-empowering thoughts that could help lead her to more positive outlook, behavior, and actions. Also worked today on some conflict resolution communication/behavioral skills, especially teaching skills of "active listening" (so as to really listen carefully and jump ahead and be thinking of a response) and paying attention to "how she says, what she says" so that she can be better heard. To work on these skills and we'll follow up next session.  States "I've focused a long time on the negative, and am trying to change and focus on the positives, but it's hard."   Goal review and progress noted.   Next appt within 2  weeks.   [name], LCSW

## 2019-06-26 NOTE — Progress Notes (Signed)
[facility] [place], [place] [address]  Internal MEDICINE  Office Visit Note  Patient Name: Ashley Stephens  [id number]  [id number]  Date of Service: 07/12/2019   Pt is here for routine health maintenance examination   Chief Complaint  Patient presents with  . Annual Exam  . Hypertension  . Gastroesophageal Reflux  . Quality Metric Gaps    diabetic foot and eye exam,      The patient is here for health maintenance exam. She is borderline diabetic. Her HgbA1c 6.1 today. She is due to have routine, fasting labs. She is also due to have screening mammogram. She continues to see psychiatry for management of depression and anxiety. She is doing well with current treatment.   Current Medication: Outpatient Encounter Medications as of 06/26/2019  Medication Sig  . ALPRAZolam (XANAX) 0.25 MG tablet Take 1 tablet (0.25 mg total) by mouth 2 (two) times daily as needed for anxiety.  Marland Kitchen desvenlafaxine (PRISTIQ) 100 MG 24 hr tablet Take 1 tablet (100 mg total) by mouth every morning.  . desvenlafaxine (PRISTIQ) 50 MG 24 hr tablet Take 1 tablet (50 mg total) by mouth every evening.  . hydrOXYzine (VISTARIL) 50 MG capsule Take 1 capsule by mouth three times daily as needed  . lamoTRIgine (LAMICTAL) 150 MG tablet Take 1 tablet (150 mg total) by mouth 2 (two) times daily.  Marland Kitchen levothyroxine (SYNTHROID) 25 MCG tablet Take 1 tablet (25 mcg total) by mouth daily before breakfast.  . lurasidone (LATUDA) 80 MG TABS tablet Take 1 tablet (80 mg total) by mouth daily with breakfast.  . omeprazole (PRILOSEC) 40 MG capsule Take 1 capsule (40 mg total) by mouth 2 (two) times daily before a meal.  . Oxcarbazepine (TRILEPTAL) 300 MG tablet Take 1 tablet (300 mg total) by mouth at bedtime.  . propranolol (INDERAL) 20 MG tablet Take 1 tablet (20 mg total) by mouth 3 (three) times daily as needed.  . sucralfate (CARAFATE) 1 GM/10ML suspension Take 10 mLs (1 g total) by mouth 4  (four) times daily -  with meals and at bedtime.  . [DISCONTINUED] levothyroxine (SYNTHROID, LEVOTHROID) 25 MCG tablet Take 1 tablet (25 mcg total) by mouth daily before breakfast.  . [DISCONTINUED] omeprazole (PRILOSEC) 20 MG capsule Take 1 capsule (20 mg total) by mouth 2 (two) times daily before a meal.   No facility-administered encounter medications on file as of 06/26/2019.     Surgical History: Past Surgical History:  Procedure Laterality Date  . ABDOMINAL HYSTERECTOMY  2013  . BARIATRIC SURGERY    . BREAST BIOPSY  1995  . GASTRIC BYPASS  2012  . TUBAL LIGATION  1996    Medical History: Past Medical History:  Diagnosis Date  . Anxiety   . Depression   . Gastric ulcer   . GERD (gastroesophageal reflux disease)   . Hypertension   . Thyroid disorder     Family History: Family History  Problem Relation Age of Onset  . Breast cancer Maternal Grandmother       Review of Systems  Constitutional: Negative for chills, fatigue and unexpected weight change.  HENT: Negative for congestion, postnasal drip, rhinorrhea, sneezing and sore throat.   Respiratory: Negative for cough, chest tightness, shortness of breath and wheezing.   Cardiovascular: Negative for chest pain and palpitations.  Gastrointestinal: Negative for abdominal pain, constipation, diarrhea, nausea and vomiting.  Endocrine: Negative for cold intolerance, heat intolerance, polydipsia and polyuria.  History of prediabetes .  Genitourinary: Negative for dysuria, frequency, menstrual problem, vaginal bleeding and vaginal discharge.  Musculoskeletal: Negative for arthralgias, back pain, joint swelling and neck pain.  Skin: Negative for rash.  Neurological: Negative for dizziness, tremors, weakness, numbness and headaches.  Hematological: Negative for adenopathy. Does not bruise/bleed easily.  Psychiatric/Behavioral: Positive for dysphoric mood. Negative for behavioral problems (Depression), sleep disturbance  and suicidal ideas. The patient is nervous/anxious.      Today's Vitals   06/26/19 0907  BP: 115/77  Pulse: 77  Resp: 16  Temp: (!) 96.6 F (35.9 C)  SpO2: 97%  Weight: 271 lb 12.8 oz (123.3 kg)  Height: 5\' 7"  (1.702 m)   Body mass index is 42.57 kg/m.  Physical Exam Vitals signs and nursing note reviewed.  Constitutional:      General: She is not in acute distress.    Appearance: Normal appearance. She is well-developed. She is obese. She is not diaphoretic.  HENT:     Head: Normocephalic and atraumatic.     Nose: Nose normal.     Mouth/Throat:     Pharynx: No oropharyngeal exudate.  Eyes:     Pupils: Pupils are equal, round, and reactive to light.  Neck:     Musculoskeletal: Normal range of motion and neck supple.     Thyroid: No thyromegaly.     Vascular: No JVD.     Trachea: No tracheal deviation.  Cardiovascular:     Rate and Rhythm: Normal rate and regular rhythm.     Pulses: Normal pulses.     Heart sounds: Normal heart sounds. No murmur. No friction rub. No gallop.   Pulmonary:     Effort: Pulmonary effort is normal. No respiratory distress.     Breath sounds: Normal breath sounds. No wheezing or rales.  Chest:     Chest wall: No tenderness.     Breasts:        Right: Normal. No swelling, bleeding, inverted nipple, mass, nipple discharge, skin change or tenderness.        Left: Normal. No swelling, bleeding, inverted nipple, mass, nipple discharge, skin change or tenderness.  Abdominal:     General: Bowel sounds are normal.     Palpations: Abdomen is soft.     Tenderness: There is no abdominal tenderness.  Musculoskeletal: Normal range of motion.  Lymphadenopathy:     Cervical: No cervical adenopathy.     Upper Body:     Right upper body: No axillary adenopathy.     Left upper body: No axillary adenopathy.  Skin:    General: Skin is warm and dry.  Neurological:     Mental Status: She is alert and oriented to person, place, and time.     Cranial  Nerves: No cranial nerve deficit.  Psychiatric:        Mood and Affect: Mood normal.        Behavior: Behavior normal.        Thought Content: Thought content normal.        Judgment: Judgment normal.      LABS: Recent Results (from the past 2160 hour(s))  UA/M w/rflx Culture, Routine     Status: Abnormal   Collection Time: 06/26/19  9:09 AM   Specimen: Urine   URINE  Result Value Ref Range   Specific Gravity, UA 1.023 1.005 - 1.030   pH, UA 5.0 5.0 - 7.5   Color, UA Yellow Yellow   Appearance Ur Cloudy (A) Clear  Leukocytes,UA Negative Negative   Protein,UA Negative Negative/Trace   Glucose, UA Negative Negative   Ketones, UA Negative Negative   RBC, UA Negative Negative   Bilirubin, UA Negative Negative   Urobilinogen, Ur 1.0 0.2 - 1.0 mg/dL   Nitrite, UA Negative Negative   Microscopic Examination Comment     Comment: Microscopic follows if indicated.   Microscopic Examination See below:     Comment: Microscopic was indicated and was performed.   Urinalysis Reflex Comment     Comment: This specimen will not reflex to a Urine Culture.  Microscopic Examination     Status: Abnormal   Collection Time: 06/26/19  9:09 AM   URINE  Result Value Ref Range   WBC, UA 0-5 0 - 5 /hpf   RBC 0-2 0 - 2 /hpf   Epithelial Cells (non renal) >10 (A) 0 - 10 /hpf   Casts Present (A) None seen /lpf   Cast Type Hyaline casts N/A   Mucus, UA Present Not Estab.   Bacteria, UA Few None seen/Few  POCT HgB A1C     Status: Abnormal   Collection Time: 06/26/19  9:23 AM  Result Value Ref Range   Hemoglobin A1C 6.1 (A) 4.0 - 5.6 %   HbA1c POC (<> result, manual entry)     HbA1c, POC (prediabetic range)     HbA1c, POC (controlled diabetic range)      Assessment/Plan: 1. Encounter for general adult medical examination with abnormal findings Annual health maintenance exam today.   2. Borderline diabetes - POCT HgB A1C 6.1 today. Continue to control through diet and exercise. Refer for  diabetic eye exam . - Microalbumin, urine - Ambulatory referral to Ophthalmology  3. Acquired hypothyroidism Lab slip given to check thyroid panel. Adjust levothyroxine as indicated.  - levothyroxine (SYNTHROID) 25 MCG tablet; Take 1 tablet (25 mcg total) by mouth daily before breakfast.  Dispense: 30 tablet; Refill: 2  4. Gastroesophageal reflux disease without esophagitis - omeprazole (PRILOSEC) 40 MG capsule; Take 1 capsule (40 mg total) by mouth 2 (two) times daily before a meal.  Dispense: 30 capsule; Refill: 3  5. Encounter for breast cancer screening other than mammogram - MM DIGITAL SCREENING BILATERAL; Future  6. Dysuria - UA/M w/rflx Culture, Routine   General Counseling: Lutricia verbalizes understanding of the findings of todays visit and agrees with plan of treatment. I have discussed any further diagnostic evaluation that may be needed or ordered today. We also reviewed her medications today. she has been encouraged to call the office with any questions or concerns that should arise related to todays visit.    Counseling:  Diabetes Counseling:  1. Addition of ACE inh/ ARB'S for nephroprotection. Microalbumin is updated  2. Diabetic foot care, prevention of complications. Podiatry consult 3. Exercise and lose weight.  4. Diabetic eye examination, Diabetic eye exam is updated  5. Monitor blood sugar closlely. nutrition counseling.  6. Sign and symptoms of hypoglycemia including shaking sweating,confusion and headaches.  This patient was seen by Leretha Pol FNP Collaboration with Dr Lavera Guise as a part of collaborative care agreement  Orders Placed This Encounter  Procedures  . Microscopic Examination  . MM DIGITAL SCREENING BILATERAL  . UA/M w/rflx Culture, Routine  . Microalbumin, urine  . Ambulatory referral to Ophthalmology  . POCT HgB A1C    Meds ordered this encounter  Medications  . omeprazole (PRILOSEC) 40 MG capsule    Sig: Take 1 capsule (40 mg  total) by  mouth 2 (two) times daily before a meal.    Dispense:  30 capsule    Refill:  3    Order Specific Question:   Supervising Provider    Answer:   Lavera Guise [place]  . levothyroxine (SYNTHROID) 25 MCG tablet    Sig: Take 1 tablet (25 mcg total) by mouth daily before breakfast.    Dispense:  30 tablet    Refill:  2    Order Specific Question:   Supervising Provider    Answer:   Lavera Guise [id number]    Time spent: [place], MD  Internal Medicine

## 2019-06-27 LAB — UA/M W/RFLX CULTURE, ROUTINE
Bilirubin, UA: NEGATIVE
Glucose, UA: NEGATIVE
Ketones, UA: NEGATIVE
Leukocytes,UA: NEGATIVE
Nitrite, UA: NEGATIVE
Protein,UA: NEGATIVE
RBC, UA: NEGATIVE
Specific Gravity, UA: 1.023 (ref 1.005–1.030)
Urobilinogen, Ur: 1 mg/dL (ref 0.2–1.0)
pH, UA: 5 (ref 5.0–7.5)

## 2019-06-27 LAB — MICROSCOPIC EXAMINATION: Epithelial Cells (non renal): >10 /hpf — AB (ref 0–10)

## 2019-07-03 ENCOUNTER — Ambulatory Visit: Payer: 59 | Admitting: Psychiatry

## 2019-07-10 ENCOUNTER — Other Ambulatory Visit: Payer: Self-pay | Admitting: Nurse Practitioner

## 2019-07-10 ENCOUNTER — Other Ambulatory Visit: Payer: Self-pay

## 2019-07-10 ENCOUNTER — Ambulatory Visit: Payer: 59 | Admitting: Psychiatry

## 2019-07-10 DIAGNOSIS — F411 Generalized anxiety disorder: Secondary | ICD-10-CM

## 2019-07-10 DIAGNOSIS — R69 Illness, unspecified: Secondary | ICD-10-CM | POA: Diagnosis not present

## 2019-07-10 DIAGNOSIS — E1165 Type 2 diabetes mellitus with hyperglycemia: Secondary | ICD-10-CM | POA: Diagnosis not present

## 2019-07-10 DIAGNOSIS — Z0001 Encounter for general adult medical examination with abnormal findings: Secondary | ICD-10-CM | POA: Diagnosis not present

## 2019-07-10 DIAGNOSIS — E039 Hypothyroidism, unspecified: Secondary | ICD-10-CM | POA: Diagnosis not present

## 2019-07-10 DIAGNOSIS — E559 Vitamin D deficiency, unspecified: Secondary | ICD-10-CM | POA: Diagnosis not present

## 2019-07-10 DIAGNOSIS — D509 Iron deficiency anemia, unspecified: Secondary | ICD-10-CM | POA: Diagnosis not present

## 2019-07-10 NOTE — Progress Notes (Signed)
      [facility] Counselor/Therapist Progress Note  Patient ID: Ashley Stephens, MRN: [id number],    Date: 07/10/2019  Time Spent: 60 minutes  8:00am to 9:00am  Treatment Type: Individual Therapy  Reported Symptoms: anxiety, nervousness, some depression  Mental Status Exam:  Appearance:   Casual     Behavior:  Appropriate and Sharing  Motor:  Normal  Speech/Language:   Normal Rate  Affect:  anxious  Mood:  anxious and depressed  Thought process:  goal directed  Thought content:    WNL  Sensory/Perceptual disturbances:    WNL  Orientation:  oriented to person, place, time/date, situation, day of week, month of year and year  Attention:  Good  Concentration:  Good  Memory:  WNL  Fund of knowledge:   Good  Insight:    Good  Judgment:   Good  Impulse Control:  Good   Risk Assessment: Danger to Self:  No Self-injurious Behavior: No Danger to Others: No Duty to Warn:no Physical Aggression / Violence:No  Access to Firearms a concern: No  Gang Involvement:No   Subjective:  Patients in office today reporting increased anxiety and nervousness, some depression re: "my own issues and all the craziness going on in the world today."  Interventions: Cognitive Behavioral Therapy and Solution-Oriented/Positive Psychology  Diagnosis:   ICD-10-CM   1. Generalized anxiety disorder  F41.1     Plan: Patient not signing tx plan on computer screen due to [place].  Treatment Goals: Patient not signing tx plan on computer screen due to [place].  Long term goal: Develop the ability to recognize, accept, and cope with feelings of depression and anxiety.  Short term goal: Learn and implement personal skills for managing stress, solving daily problems, and resolving conflicts effectively.  Strategy: Teach patient calming strategies, problem solving skills, and conflict resolution skills to better manage daily stressors.  Progressing- Patient anxious, tearful, fearful,  and with some depression.  Denies any SI or HI.  Heightened anxiety and nervousness about all the world chaos and presidential election.  Very concerned about "what may happen in our country."  Very impatient and reluctant to work on that.  Challenged her on this some as it affects her in a lot of ways.  Processes how her job and her personality over the years all contribute to her impatience.  Looked at how the impatience can trip her up and hold her back but it's still difficult for her to imagine letting go of the impatience.  Worked on [place] including slow deep breathing exercises and positive self-talk. Hard for her to let go of long-standing negativity. Still challenging this as gave homework assignment where she is going to work on these thoughts and self-messages and what benefit it is to her to hang on to all this negativity and what is blocking her from changing.  Goal review and progress noted.  Next appt within 2 weeks.   [name], LCSW

## 2019-07-11 LAB — CBC
Hematocrit: 41 % (ref 34.0–46.6)
Hemoglobin: 13.5 g/dL (ref 11.1–15.9)
MCH: 28.3 pg (ref 26.6–33.0)
MCHC: 32.9 g/dL (ref 31.5–35.7)
MCV: 86 fL (ref 79–97)
Platelets: 340 10*3/uL (ref 150–450)
RBC: 4.77 x10E6/uL (ref 3.77–5.28)
RDW: 14 % (ref 11.7–15.4)
WBC: 4.9 10*3/uL (ref 3.4–10.8)

## 2019-07-11 LAB — IRON AND TIBC
Iron Saturation: 17 % (ref 15–55)
Iron: 63 ug/dL (ref 27–159)
Total Iron Binding Capacity: 369 ug/dL (ref 250–450)
UIBC: 306 ug/dL (ref 131–425)

## 2019-07-11 LAB — COMPREHENSIVE METABOLIC PANEL
ALT: 12 IU/L (ref 0–32)
AST: 17 IU/L (ref 0–40)
Albumin/Globulin Ratio: 1.8 (ref 1.2–2.2)
Albumin: 4.2 g/dL (ref 3.8–4.8)
Alkaline Phosphatase: 80 IU/L (ref 39–117)
BUN/Creatinine Ratio: 9 (ref 9–23)
BUN: 9 mg/dL (ref 6–24)
Bilirubin Total: <0.2 mg/dL (ref 0.0–1.2)
CO2: 24 mmol/L (ref 20–29)
Calcium: 9.5 mg/dL (ref 8.7–10.2)
Chloride: 101 mmol/L (ref 96–106)
Creatinine, Ser: 1.01 mg/dL — ABNORMAL HIGH (ref 0.57–1.00)
GFR calc Af Amer: 78 mL/min/{1.73_m2} (ref >59)
GFR calc non Af Amer: 67 mL/min/{1.73_m2} (ref >59)
Globulin, Total: 2.3 g/dL (ref 1.5–4.5)
Glucose: 82 mg/dL (ref 65–99)
Potassium: 5 mmol/L (ref 3.5–5.2)
Sodium: 139 mmol/L (ref 134–144)
Total Protein: 6.5 g/dL (ref 6.0–8.5)

## 2019-07-11 LAB — FERRITIN: Ferritin: 16 ng/mL (ref 15–150)

## 2019-07-11 LAB — LIPID PANEL W/O CHOL/HDL RATIO
Cholesterol, Total: 203 mg/dL — ABNORMAL HIGH (ref 100–199)
HDL: 43 mg/dL (ref >39)
LDL Chol Calc (NIH): 144 mg/dL — ABNORMAL HIGH (ref 0–99)
Triglycerides: 88 mg/dL (ref 0–149)
VLDL Cholesterol Cal: 16 mg/dL (ref 5–40)

## 2019-07-11 LAB — B12 AND FOLATE PANEL
Folate: 4.4 ng/mL (ref >3.0)
Vitamin B-12: 212 pg/mL — ABNORMAL LOW (ref 232–1245)

## 2019-07-11 LAB — VITAMIN D 25 HYDROXY (VIT D DEFICIENCY, FRACTURES): Vit D, 25-Hydroxy: 14.5 ng/mL — ABNORMAL LOW (ref 30.0–100.0)

## 2019-07-11 LAB — T4, FREE: Free T4: 0.91 ng/dL (ref 0.82–1.77)

## 2019-07-11 LAB — TSH: TSH: 1.77 u[IU]/mL (ref 0.450–4.500)

## 2019-07-12 DIAGNOSIS — Z0001 Encounter for general adult medical examination with abnormal findings: Secondary | ICD-10-CM | POA: Insufficient documentation

## 2019-07-12 DIAGNOSIS — R7303 Prediabetes: Secondary | ICD-10-CM | POA: Insufficient documentation

## 2019-07-12 DIAGNOSIS — K219 Gastro-esophageal reflux disease without esophagitis: Secondary | ICD-10-CM | POA: Insufficient documentation

## 2019-07-12 DIAGNOSIS — R300 Dysuria: Secondary | ICD-10-CM | POA: Insufficient documentation

## 2019-07-12 DIAGNOSIS — Z1239 Encounter for other screening for malignant neoplasm of breast: Secondary | ICD-10-CM | POA: Insufficient documentation

## 2019-07-20 ENCOUNTER — Other Ambulatory Visit: Payer: Self-pay | Admitting: Psychiatry

## 2019-07-20 DIAGNOSIS — F5101 Primary insomnia: Secondary | ICD-10-CM

## 2019-07-20 NOTE — Telephone Encounter (Signed)
Was due back end of October, nothing scheduled yet

## 2019-07-24 ENCOUNTER — Telehealth: Payer: Self-pay

## 2019-07-24 ENCOUNTER — Telehealth: Payer: Self-pay | Admitting: Internal Medicine

## 2019-07-24 ENCOUNTER — Ambulatory Visit: Payer: 59 | Admitting: Psychiatry

## 2019-07-24 NOTE — Telephone Encounter (Signed)
-----   Message from Lavera Guise, MD sent at 07/17/2019  3:15 PM EST ----- Review labs  ----- Message ----- From: Lavone Neri Lab Results In Sent: 07/13/2019   5:23 PM EST To: Ronnell Freshwater, NP

## 2019-07-24 NOTE — Telephone Encounter (Signed)
Confirmed appointment with patient. klh °

## 2019-07-27 DIAGNOSIS — L700 Acne vulgaris: Secondary | ICD-10-CM | POA: Diagnosis not present

## 2019-07-27 NOTE — Progress Notes (Signed)
Hey. She sees you 07/28/2019

## 2019-07-28 ENCOUNTER — Encounter: Payer: Self-pay | Admitting: Adult Health

## 2019-07-28 ENCOUNTER — Other Ambulatory Visit: Payer: Self-pay

## 2019-07-28 ENCOUNTER — Ambulatory Visit: Payer: 59 | Admitting: Adult Health

## 2019-07-28 VITALS — Ht 67.0 in | Wt 272.0 lb

## 2019-07-28 DIAGNOSIS — E559 Vitamin D deficiency, unspecified: Secondary | ICD-10-CM

## 2019-07-28 DIAGNOSIS — R7989 Other specified abnormal findings of blood chemistry: Secondary | ICD-10-CM | POA: Diagnosis not present

## 2019-07-28 DIAGNOSIS — E538 Deficiency of other specified B group vitamins: Secondary | ICD-10-CM

## 2019-07-28 MED ORDER — VITAMIN D (ERGOCALCIFEROL) 1.25 MG (50000 UNIT) PO CAPS: 50000.0000 [IU] | ORAL_CAPSULE | ORAL | 0 refills | Status: DC

## 2019-07-28 NOTE — Progress Notes (Signed)
[facility] [place], [place] [address]  Internal MEDICINE  Telephone Visit  Patient Name: Ashley Stephens  [id number]  [id number]  Date of Service: 08/09/2019  I connected with the patient at 940 by telephone and verified the patients identity using two identifiers.   I discussed the limitations, risks, security and privacy concerns of performing an evaluation and management service by telephone and the availability of in person appointments. I also discussed with the patient that there may be a patient responsible charge related to the service.  The patient expressed understanding and agrees to proceed.    Chief Complaint  Patient presents with  . Telephone Assessment  . Telephone Screen  . Follow-up    LABS  . Hypertension  . Depression    HPI  PT seen via video for follow up on labs.  Overall she is doing well. Her labs show a slightly elevated creatinine, as well as cholesterol.  Her vitamin D and B12 are noted to be low at this time. Otherwise her labs are normal.  She denies any current complaints.  Her HTN and depression are controlled, per patient.      Current Medication: Outpatient Encounter Medications as of 07/28/2019  Medication Sig  . desvenlafaxine (PRISTIQ) 100 MG 24 hr tablet Take 1 tablet (100 mg total) by mouth every morning.  . desvenlafaxine (PRISTIQ) 50 MG 24 hr tablet Take 1 tablet (50 mg total) by mouth every evening.  . hydrOXYzine (VISTARIL) 50 MG capsule Take 1 capsule by mouth three times daily as needed  . lamoTRIgine (LAMICTAL) 150 MG tablet Take 1 tablet (150 mg total) by mouth 2 (two) times daily.  Marland Kitchen levothyroxine (SYNTHROID) 25 MCG tablet Take 1 tablet (25 mcg total) by mouth daily before breakfast.  . omeprazole (PRILOSEC) 40 MG capsule Take 1 capsule (40 mg total) by mouth 2 (two) times daily before a meal.  . Oxcarbazepine (TRILEPTAL) 300 MG tablet Take 1 tablet (300 mg total) by mouth at bedtime.  .  propranolol (INDERAL) 20 MG tablet Take 1 tablet (20 mg total) by mouth 3 (three) times daily as needed.  . sucralfate (CARAFATE) 1 GM/10ML suspension Take 10 mLs (1 g total) by mouth 4 (four) times daily -  with meals and at bedtime.  . ALPRAZolam (XANAX) 0.25 MG tablet Take 1 tablet (0.25 mg total) by mouth 2 (two) times daily as needed for anxiety.  Marland Kitchen lurasidone (LATUDA) 80 MG TABS tablet Take 1 tablet (80 mg total) by mouth daily with breakfast.  . Vitamin D, Ergocalciferol, (DRISDOL) 1.25 MG (50000 UT) CAPS capsule Take 1 capsule (50,000 Units total) by mouth every 7 (seven) days.   No facility-administered encounter medications on file as of 07/28/2019.     Surgical History: Past Surgical History:  Procedure Laterality Date  . ABDOMINAL HYSTERECTOMY  2013  . BARIATRIC SURGERY    . BREAST BIOPSY  1995  . GASTRIC BYPASS  2012  . TUBAL LIGATION  1996    Medical History: Past Medical History:  Diagnosis Date  . Anxiety   . Depression   . Gastric ulcer   . GERD (gastroesophageal reflux disease)   . Hypertension   . Thyroid disorder     Family History: Family History  Problem Relation Age of Onset  . Breast cancer Maternal Grandmother     Social History   Socioeconomic History  . Marital status: Single    Spouse name: Not on file  . Number of children:  Not on file  . Years of education: Not on file  . Highest education level: Not on file  Occupational History  . Not on file  Social Needs  . Financial resource strain: Not on file  . Food insecurity    Worry: Not on file    Inability: Not on file  . Transportation needs    Medical: Not on file    Non-medical: Not on file  Tobacco Use  . Smoking status: Never Smoker  . Smokeless tobacco: Never Used  Substance and Sexual Activity  . Alcohol use: No    Alcohol/week: 0.0 standard drinks  . Drug use: No  . Sexual activity: Yes    Birth control/protection: Surgical  Lifestyle  . Physical activity    Days per  week: Not on file    Minutes per session: Not on file  . Stress: Not on file  Relationships  . Social Herbalist on phone: Not on file    Gets together: Not on file    Attends religious service: Not on file    Active member of club or organization: Not on file    Attends meetings of clubs or organizations: Not on file    Relationship status: Not on file  . Intimate partner violence    Fear of current or ex partner: Not on file    Emotionally abused: Not on file    Physically abused: Not on file    Forced sexual activity: Not on file  Other Topics Concern  . Not on file  Social History Narrative  . Not on file      Review of Systems  Constitutional: Negative for chills, fatigue and unexpected weight change.  HENT: Negative for congestion, rhinorrhea, sneezing and sore throat.   Eyes: Negative for photophobia, pain and redness.  Respiratory: Negative for cough, chest tightness and shortness of breath.   Cardiovascular: Negative for chest pain and palpitations.  Gastrointestinal: Negative for abdominal pain, constipation, diarrhea, nausea and vomiting.  Endocrine: Negative.   Genitourinary: Negative for dysuria and frequency.  Musculoskeletal: Negative for arthralgias, back pain, joint swelling and neck pain.  Skin: Negative for rash.  Allergic/Immunologic: Negative.   Neurological: Negative for tremors and numbness.  Hematological: Negative for adenopathy. Does not bruise/bleed easily.  Psychiatric/Behavioral: Negative for behavioral problems and sleep disturbance. The patient is not nervous/anxious.     Vital Signs: Ht 5\' 7"  (1.702 m)   Wt 272 lb (123.4 kg)   BMI 42.60 kg/m    Observation/Objective:  Well appearing, NAD noted.  Assessment/Plan: 1. Elevated serum creatinine Will repeat CMP in 6 weeks and follow up accordingly.  - Comprehensive Metabolic Panel (CMET)  2. B12 deficiency PT will schedule for B12 injection once weekly x 3 weeks, and then  monthly for 3 months.  Will recheck B12 level.   3. Vitamin D deficiency Take Drisodil as directed and then take daily otc vitamin, will reevaluate vit D level in the coming months.   General Counseling: Myers verbalizes understanding of the findings of today's phone visit and agrees with plan of treatment. I have discussed any further diagnostic evaluation that may be needed or ordered today. We also reviewed her medications today. she has been encouraged to call the office with any questions or concerns that should arise related to todays visit.    Orders Placed This Encounter  Procedures  . Comprehensive Metabolic Panel (CMET)    Meds ordered this encounter  Medications  . Vitamin  D, Ergocalciferol, (DRISDOL) 1.25 MG (50000 UT) CAPS capsule    Sig: Take 1 capsule (50,000 Units total) by mouth every 7 (seven) days.    Dispense:  6 capsule    Refill:  0    Time spent: [place] AGNP-C Internal medicine

## 2019-08-07 ENCOUNTER — Ambulatory Visit: Payer: 59 | Admitting: Psychiatry

## 2019-08-07 ENCOUNTER — Ambulatory Visit (INDEPENDENT_AMBULATORY_CARE_PROVIDER_SITE_OTHER): Payer: 59

## 2019-08-07 ENCOUNTER — Other Ambulatory Visit: Payer: Self-pay

## 2019-08-07 DIAGNOSIS — F411 Generalized anxiety disorder: Secondary | ICD-10-CM | POA: Diagnosis not present

## 2019-08-07 DIAGNOSIS — E538 Deficiency of other specified B group vitamins: Secondary | ICD-10-CM

## 2019-08-07 DIAGNOSIS — R69 Illness, unspecified: Secondary | ICD-10-CM | POA: Diagnosis not present

## 2019-08-07 MED ORDER — CYANOCOBALAMIN 1000 MCG/ML IJ SOLN
1000.0000 ug | Freq: Once | INTRAMUSCULAR | Status: AC
  Administered 2019-08-07: 1000 ug via INTRAMUSCULAR

## 2019-08-07 NOTE — Progress Notes (Signed)
      [facility] Counselor/Therapist Progress Note  Patient ID: Ashley Stephens, MRN: [id number],    Date: 08/07/2019  Time Spent: 60 minutes  9:00am to 10:00am  Treatment Type: Individual Therapy  Reported Symptoms: anxiety, frustration, stressed   Mental Status Exam:  Appearance:   Neat     Behavior:  Appropriate and Sharing  Motor:  Normal  Speech/Language:   Normal Rate  Affect:  anxious, stressed  Mood:  anxious  Thought process:  goal directed  Thought content:    WNL  Sensory/Perceptual disturbances:    WNL  Orientation:  oriented to person, place, time/date, situation, day of week, month of year and year  Attention:  Good  Concentration:  Good  Memory:  WNL  Fund of knowledge:   Good  Insight:    Good  Judgment:   Good  Impulse Control:  Good   Risk Assessment: Danger to Self:  No Self-injurious Behavior: No Danger to Others: No Duty to Warn:no Physical Aggression / Violence:No  Access to Firearms a concern: No  Gang Involvement:No   Subjective:  Patient in today dealing with anxiety, "anxiety, anxiety, anxiety".  Dealing with several volatile situations that have contributed to increase in her anxiety.    Interventions: Cognitive Behavioral Therapy and Solution-Oriented/Positive Psychology  Diagnosis:   ICD-10-CM   1. Generalized anxiety disorder  F41.1     Plan: Patient not signing tx plan on computer screen due to [place].  Treatment Goals: Patient not signing tx plan on computer screen due to [place].  Long term goal: Develop the ability to recognize, accept, and cope with feelings of depression and anxiety.  Short term goal: Learn and implement personal skills for managing stress, solving daily problems, and resolving conflicts effectively.  Strategy: Teach patient calming strategies, problem solving skills, and conflict resolution skills to better manage daily stressors.  Progressing- Patient continues working on her goals and  making progress. Working today on short term goal re: solving daily problems and using effective problem solving skills.  This quickly spilled over into significant self-acceptance and self-esteem issues.  Feeling "different" at times "but not crazy".  Looks down on herself.  "Puts on happy face when I'm really not feeling happy."  Patient worked well on this today and looking at her habit of not being open with what she is really feeling, and instead trying to give impression that all is ok.  Talked about how this is self-defeating.  Wants to stop overthinking and try to be more open and honest about how she is not being honest with herself.  To work more in between session on her self-talk which part of the base on her self-esteem issues.  Practiced in session, more positive self-talk and to continue working on this outside of session.  Goal review and progress/efforts noted with patient.   Next appt within 2 weeks.   [name], LCSW

## 2019-08-13 ENCOUNTER — Ambulatory Visit: Payer: 59 | Admitting: Psychiatry

## 2019-08-14 ENCOUNTER — Other Ambulatory Visit: Payer: Self-pay

## 2019-08-14 ENCOUNTER — Ambulatory Visit (INDEPENDENT_AMBULATORY_CARE_PROVIDER_SITE_OTHER): Payer: 59

## 2019-08-14 ENCOUNTER — Ambulatory Visit (INDEPENDENT_AMBULATORY_CARE_PROVIDER_SITE_OTHER): Payer: 59 | Admitting: Psychiatry

## 2019-08-14 ENCOUNTER — Encounter: Payer: Self-pay | Admitting: Psychiatry

## 2019-08-14 DIAGNOSIS — E538 Deficiency of other specified B group vitamins: Secondary | ICD-10-CM | POA: Diagnosis not present

## 2019-08-14 DIAGNOSIS — F5101 Primary insomnia: Secondary | ICD-10-CM | POA: Diagnosis not present

## 2019-08-14 DIAGNOSIS — F39 Unspecified mood [affective] disorder: Secondary | ICD-10-CM

## 2019-08-14 DIAGNOSIS — R69 Illness, unspecified: Secondary | ICD-10-CM | POA: Diagnosis not present

## 2019-08-14 DIAGNOSIS — F411 Generalized anxiety disorder: Secondary | ICD-10-CM

## 2019-08-14 MED ORDER — CYANOCOBALAMIN 1000 MCG/ML IJ SOLN
1000.0000 ug | Freq: Once | INTRAMUSCULAR | Status: AC
  Administered 2019-08-14: 1000 ug via INTRAMUSCULAR

## 2019-08-14 MED ORDER — LURASIDONE HCL 80 MG PO TABS: 80.0000 mg | ORAL_TABLET | Freq: Every day | ORAL | 3 refills | Status: DC

## 2019-08-14 MED ORDER — HYDROXYZINE PAMOATE 50 MG PO CAPS: ORAL_CAPSULE | ORAL | 1 refills | Status: DC

## 2019-08-14 MED ORDER — OXCARBAZEPINE 300 MG PO TABS: 300.0000 mg | ORAL_TABLET | Freq: Every day | ORAL | 0 refills | Status: DC

## 2019-08-14 MED ORDER — ALPRAZOLAM 0.25 MG PO TABS: 0.2500 mg | ORAL_TABLET | Freq: Two times a day (BID) | ORAL | 2 refills | Status: DC | PRN

## 2019-08-14 MED ORDER — LAMOTRIGINE 150 MG PO TABS: 150.0000 mg | ORAL_TABLET | Freq: Two times a day (BID) | ORAL | 1 refills | Status: DC

## 2019-08-14 NOTE — Progress Notes (Signed)
   08/14/19 0954  Facial and Oral Movements  Muscles of Facial Expression 0  Lips and Perioral Area 0  Jaw 0  Tongue 0  Extremity Movements  Upper (arms, wrists, hands, fingers) 0  Lower (legs, knees, ankles, toes) 0  Trunk Movements  Neck, shoulders, hips 0  Overall Severity  Severity of abnormal movements (highest score from questions above) 0  Incapacitation due to abnormal movements 0  Patient's awareness of abnormal movements (rate only patient's report) 0  AIMS Total Score  AIMS Total Score 0

## 2019-08-14 NOTE — Progress Notes (Signed)
Ashley Stephens [id number] Jan 02, 1976 43 y.o.  Subjective:   Patient ID:  Ashley Stephens is a 72 y.o. (DOB 1976/06/03) female.  Chief Complaint:  Chief Complaint  Patient presents with  . Anxiety  . Depression  . Sleeping Problem    HPI Ashley Stephens presents to the office today for follow-up of anxiety and depression. She reports that she continues to have worry and panic at work, particularly in the morning. Reports that she notices her energy drops in the afternoon from severe anxiety in the morning. Energy and motivation are low in the morning.  She reports that she frequently replays her conversations she has had with customers and critiques her performance and worries if she is responding appropriately. Reports anxiety in response to COVID and conflicting information. She reports that her mood is "stable. I don't have a lot of highs and I don't have a lot of lows." Reports decreased irritability. She reports that she has been trying to focus on one thing at a time. She reports that her sleep varies depending on what is going on and if she has had time to wind down and the type of day she had. Denies nightmares. Appetite fluctuates. Reports that her concentration also varies. Denies impulsive or risky behavior. Denies SI and reports that she is taking precautions not to get COVID and to ensure her health.   Has been seeing Ashley Ginger, LCSW and reports that this has been helpful.   Review of Systems:  Review of Systems  Musculoskeletal: Negative for gait problem.  Skin:       Hair loss.   Neurological: Negative for tremors.  Psychiatric/Behavioral:       Please refer to HPI    Medications: I have reviewed the patient's current medications.  Current Outpatient Medications  Medication Sig Dispense Refill  . Cyanocobalamin 1000 MCG/ML KIT Inject as directed.    . desvenlafaxine (PRISTIQ) 100 MG 24 hr tablet Take 1 tablet (100 mg total) by mouth every  morning. 90 tablet 1  . desvenlafaxine (PRISTIQ) 50 MG 24 hr tablet Take 1 tablet (50 mg total) by mouth every evening. 90 tablet 1  . hydrOXYzine (VISTARIL) 50 MG capsule Take 1 capsule by mouth three times daily as needed 90 capsule 1  . lamoTRIgine (LAMICTAL) 150 MG tablet Take 1 tablet (150 mg total) by mouth 2 (two) times daily. 180 tablet 1  . levothyroxine (SYNTHROID) 25 MCG tablet Take 1 tablet (25 mcg total) by mouth daily before breakfast. 30 tablet 2  . omeprazole (PRILOSEC) 40 MG capsule Take 1 capsule (40 mg total) by mouth 2 (two) times daily before a meal. 30 capsule 3  . Oxcarbazepine (TRILEPTAL) 300 MG tablet Take 1 tablet (300 mg total) by mouth at bedtime. 90 tablet 0  . propranolol (INDERAL) 20 MG tablet Take 1 tablet (20 mg total) by mouth 3 (three) times daily as needed. 270 tablet 1  . sucralfate (CARAFATE) 1 GM/10ML suspension Take 10 mLs (1 g total) by mouth 4 (four) times daily -  with meals and at bedtime. 420 mL 0  . Vitamin D, Ergocalciferol, (DRISDOL) 1.25 MG (50000 UT) CAPS capsule Take 1 capsule (50,000 Units total) by mouth every 7 (seven) days. 6 capsule 0  . [START ON 09/01/2019] ALPRAZolam (XANAX) 0.25 MG tablet Take 1 tablet (0.25 mg total) by mouth 2 (two) times daily as needed for anxiety. 45 tablet 2  . lurasidone (LATUDA) 80 MG TABS tablet Take 1 tablet (80  mg total) by mouth daily with breakfast. 30 tablet 3   No current facility-administered medications for this visit.    Medication Side Effects: None  Allergies: No Known Allergies  Past Medical History:  Diagnosis Date  . Anxiety   . Depression   . Gastric ulcer   . GERD (gastroesophageal reflux disease)   . Hypertension   . Thyroid disorder     Family History  Problem Relation Age of Onset  . Breast cancer Maternal Grandmother     Social History   Socioeconomic History  . Marital status: Single    Spouse name: Not on file  . Number of children: Not on file  . Years of education:  Not on file  . Highest education level: Not on file  Occupational History  . Not on file  Tobacco Use  . Smoking status: Never Smoker  . Smokeless tobacco: Never Used  Substance and Sexual Activity  . Alcohol use: No    Alcohol/week: 0.0 standard drinks  . Drug use: No  . Sexual activity: Yes    Birth control/protection: Surgical  Other Topics Concern  . Not on file  Social History Narrative  . Not on file   Social Determinants of Health   Financial Resource Strain:   . Difficulty of Paying Living Expenses: Not on file  Food Insecurity:   . Worried About Charity fundraiser in the Last Year: Not on file  . Ran Out of Food in the Last Year: Not on file  Transportation Needs:   . Lack of Transportation (Medical): Not on file  . Lack of Transportation (Non-Medical): Not on file  Physical Activity:   . Days of Exercise per Week: Not on file  . Minutes of Exercise per Session: Not on file  Stress:   . Feeling of Stress : Not on file  Social Connections:   . Frequency of Communication with Friends and Family: Not on file  . Frequency of Social Gatherings with Friends and Family: Not on file  . Attends Religious Services: Not on file  . Active Member of Clubs or Organizations: Not on file  . Attends Archivist Meetings: Not on file  . Marital Status: Not on file  Intimate Partner Violence:   . Fear of Current or Ex-Partner: Not on file  . Emotionally Abused: Not on file  . Physically Abused: Not on file  . Sexually Abused: Not on file    Past Medical History, Surgical history, Social history, and Family history were reviewed and updated as appropriate.   Please see review of systems for further details on the patient's review from today.   Objective:   Physical Exam:  There were no vitals taken for this visit.  Physical Exam Constitutional:      General: She is not in acute distress.    Appearance: She is well-developed.  Musculoskeletal:         General: No deformity.  Neurological:     Mental Status: She is alert and oriented to person, place, and time.     Coordination: Coordination normal.  Psychiatric:        Attention and Perception: Attention and perception normal. She does not perceive auditory or visual hallucinations.        Mood and Affect: Mood is anxious. Affect is not labile, blunt, angry or inappropriate.        Speech: Speech normal.        Behavior: Behavior normal.  Thought Content: Thought content normal. Thought content is not paranoid or delusional. Thought content does not include homicidal or suicidal ideation. Thought content does not include homicidal or suicidal plan.        Cognition and Memory: Cognition and memory normal.        Judgment: Judgment normal.     Comments: Insight intact Mood presents as sad when discussing loss and grief     Lab Review:     Component Value Date/Time   NA 139 07/10/2019 0953   NA 142 12/03/2011 1059   K 5.0 07/10/2019 0953   K 3.4 (L) 12/03/2011 1059   CL 101 07/10/2019 0953   CL 107 12/03/2011 1059   CO2 24 07/10/2019 0953   CO2 27 12/03/2011 1059   GLUCOSE 82 07/10/2019 0953   GLUCOSE 105 (H) 02/05/2018 1045   GLUCOSE 66 12/03/2011 1059   BUN 9 07/10/2019 0953   BUN 4 (L) 12/03/2011 1059   CREATININE 1.01 (H) 07/10/2019 0953   CREATININE 0.82 12/03/2011 1059   CALCIUM 9.5 07/10/2019 0953   CALCIUM 8.5 12/03/2011 1059   PROT 6.5 07/10/2019 0953   ALBUMIN 4.2 07/10/2019 0953   AST 17 07/10/2019 0953   ALT 12 07/10/2019 0953   ALKPHOS 80 07/10/2019 0953   BILITOT <0.2 07/10/2019 0953   GFRNONAA 67 07/10/2019 0953   GFRNONAA >60 12/03/2011 1059   GFRAA 78 07/10/2019 0953   GFRAA >60 12/03/2011 1059       Component Value Date/Time   WBC 4.9 07/10/2019 0953   WBC 4.7 02/05/2018 1045   RBC 4.77 07/10/2019 0953   RBC 4.65 02/05/2018 1045   HGB 13.5 07/10/2019 0953   HCT 41.0 07/10/2019 0953   PLT 340 07/10/2019 0953   MCV 86 07/10/2019 0953    MCH 28.3 07/10/2019 0953   MCH 28.8 02/05/2018 1045   MCHC 32.9 07/10/2019 0953   MCHC 33.9 02/05/2018 1045   RDW 14.0 07/10/2019 0953   LYMPHSABS 1.4 02/05/2018 1045   LYMPHSABS 1.8 12/27/2017 0740   MONOABS 0.4 02/05/2018 1045   EOSABS 0.1 02/05/2018 1045   EOSABS 0.1 12/27/2017 0740   BASOSABS 0.0 02/05/2018 1045   BASOSABS 0.0 12/27/2017 0740    No results found for: POCLITH, LITHIUM   No results found for: PHENYTOIN, PHENOBARB, VALPROATE, CBMZ   .res Assessment: Plan:   Pt seen for 30 minutes and greater than 50% of session spent counseling pt and coordination of care to include reviewing recent lab results, discussing metabolic risks of Latuda, and answering pt's questions re: cholesterol values. Discussed that lower Vit B 12 and Vit D levels may be contributing to fatigue and depression and pt may experience improved mood and energy with supplementation.  Will continue current medications since pt has reported improved mood and anxiety s/s with medications.  Recommend continuing psychotherapy with Ashley Cloud, LCSW.  Pt to f/u in 4 weeks or sooner if clinically indicated.  Patient advised to contact office with any questions, adverse effects, or acute worsening in signs and symptoms.  Ashley Stephens was seen today for anxiety, depression and sleeping problem.  Diagnoses and all orders for this visit:  Generalized anxiety disorder -     ALPRAZolam (XANAX) 0.25 MG tablet; Take 1 tablet (0.25 mg total) by mouth 2 (two) times daily as needed for anxiety. -     Oxcarbazepine (TRILEPTAL) 300 MG tablet; Take 1 tablet (300 mg total) by mouth at bedtime.  Primary insomnia -     hydrOXYzine (  VISTARIL) 50 MG capsule; Take 1 capsule by mouth three times daily as needed -     Oxcarbazepine (TRILEPTAL) 300 MG tablet; Take 1 tablet (300 mg total) by mouth at bedtime.  Mood disorder (HCC) -     Oxcarbazepine (TRILEPTAL) 300 MG tablet; Take 1 tablet (300 mg total) by mouth at bedtime. -      lamoTRIgine (LAMICTAL) 150 MG tablet; Take 1 tablet (150 mg total) by mouth 2 (two) times daily. -     lurasidone (LATUDA) 80 MG TABS tablet; Take 1 tablet (80 mg total) by mouth daily with breakfast.     Please see After Visit Summary for patient specific instructions.  Future Appointments  Date Time Provider [place]  08/21/2019  9:00 AM [name], LCSW [facility] None  08/21/2019  1:45 PM NOVA-NURSE [facility] None  09/11/2019  8:00 AM [name], LCSW [facility] None  09/15/2019  9:00 AM [name], PMHNP [facility] None  09/25/2019  8:00 AM [name], LCSW [facility] None  10/09/2019  8:00 AM [name], LCSW [facility] None  10/23/2019  8:00 AM [name], LCSW [facility] None  10/30/2019  8:30 AM Ronnell Freshwater, NP [facility] None    No orders of the defined types were placed in this encounter.   -------------------------------

## 2019-08-21 ENCOUNTER — Other Ambulatory Visit: Payer: Self-pay

## 2019-08-21 ENCOUNTER — Ambulatory Visit (INDEPENDENT_AMBULATORY_CARE_PROVIDER_SITE_OTHER): Payer: 59

## 2019-08-21 ENCOUNTER — Ambulatory Visit: Payer: 59 | Admitting: Psychiatry

## 2019-08-21 DIAGNOSIS — E538 Deficiency of other specified B group vitamins: Secondary | ICD-10-CM | POA: Diagnosis not present

## 2019-08-21 MED ORDER — CYANOCOBALAMIN 1000 MCG/ML IJ SOLN
1000.0000 ug | Freq: Once | INTRAMUSCULAR | Status: AC
  Administered 2019-08-21: 1000 ug via INTRAMUSCULAR

## 2019-09-11 ENCOUNTER — Other Ambulatory Visit: Payer: Self-pay

## 2019-09-11 ENCOUNTER — Encounter: Payer: Self-pay | Admitting: Psychiatry

## 2019-09-11 ENCOUNTER — Ambulatory Visit (INDEPENDENT_AMBULATORY_CARE_PROVIDER_SITE_OTHER): Payer: Managed Care, Other (non HMO) | Admitting: Psychiatry

## 2019-09-11 DIAGNOSIS — F411 Generalized anxiety disorder: Secondary | ICD-10-CM | POA: Diagnosis not present

## 2019-09-11 DIAGNOSIS — R69 Illness, unspecified: Secondary | ICD-10-CM | POA: Diagnosis not present

## 2019-09-11 NOTE — Progress Notes (Signed)
      [facility] Counselor/Therapist Progress Note  Patient ID: Ashley Stephens, MRN: [id number],    Date: 09/11/2019  Time Spent: 60 minutes 8:00am to 9:00am  Treatment Type: Individual Therapy  Reported Symptoms: anxiety, frustration work and the world, some depression, anger/discouraged at the world. Recent work incident (virtually, working at home) and was communicating with a co-worker---communication/misunderstanding/frustration for patient.  Mental Status Exam:  Appearance:   Casual     Behavior:  Appropriate and Sharing  Motor:  Normal  Speech/Language:   Normal Rate  Affect:  anxious, frustrated, angry, sad  Mood:  angry, anxious, depressed and irritable (initially)  Thought process:  goal directed  Thought content:    WNL  Sensory/Perceptual disturbances:    WNL  Orientation:  oriented to person, place, time/date, situation, day of week, month of year and year  Attention:  Good  Concentration:  Good  Memory:  WNL  Fund of knowledge:   Good  Insight:    Good  Judgment:   Good  Impulse Control:  Fair   Risk Assessment: Danger to Self:  No Self-injurious Behavior: No Danger to Others: No Duty to Warn:no Physical Aggression / Violence:No  Access to Firearms a concern: No  Gang Involvement:No   Subjective: Patient in today feeling "more defeated by myself and trouble coping with things like normal people do."  Interventions: Cognitive Behavioral Therapy and Solution-Oriented/Positive Psychology  Diagnosis:   ICD-10-CM   1. Generalized anxiety disorder  F41.1     Plan: Patient not signing tx plan on computer screen due to [place].  Treatment Goals: Patient not signing tx plan on computer screen due to [place].  Long term goal: Develop the ability to recognize, accept, and cope with feelings of depression and anxiety.  Short term goal: Learn and implement personal skills for managing stress, solving daily problems, and resolving conflicts  effectively.  Strategy: Teach patient calming strategies, problem solving skills, and conflict resolution skills to better manage daily stressors.  Progressing- Patient in today very upset, irritable, angry, depressed, and anxious about family situation, work, Charity fundraiser, Art therapist, and the world situation.  Used the session to allow and encourage her to vent what all she was feeling. Some tears and did a good job of putting words to her feelings and sharing openly how distressed she was. Goal-directed in our efforts to not only vent her feelings, but to also continue strategies in managing her intense feelings, which she is partly doing today in expressing her thoughts and feelings rather than acting out on them.  To work more on expressing her feelings in healthy ways and being more determined to "stay in the present versus the past, nor jumping to far ahead into the future."  Discussed patient's questions and offered suggestions in breaking habit of "holding onto the past."  She agrees that this is something to change and seems motivated. Goal review and progress/efforts noted with patient.  Next appt within 2 weeks.   [name], LCSW

## 2019-09-15 ENCOUNTER — Encounter: Payer: Self-pay | Admitting: Psychiatry

## 2019-09-15 ENCOUNTER — Ambulatory Visit (INDEPENDENT_AMBULATORY_CARE_PROVIDER_SITE_OTHER): Payer: Managed Care, Other (non HMO) | Admitting: Psychiatry

## 2019-09-15 DIAGNOSIS — F411 Generalized anxiety disorder: Secondary | ICD-10-CM | POA: Diagnosis not present

## 2019-09-15 DIAGNOSIS — R69 Illness, unspecified: Secondary | ICD-10-CM | POA: Diagnosis not present

## 2019-09-15 DIAGNOSIS — F39 Unspecified mood [affective] disorder: Secondary | ICD-10-CM | POA: Diagnosis not present

## 2019-09-15 DIAGNOSIS — F5101 Primary insomnia: Secondary | ICD-10-CM

## 2019-09-15 DIAGNOSIS — Z0289 Encounter for other administrative examinations: Secondary | ICD-10-CM

## 2019-09-15 NOTE — Progress Notes (Signed)
Ashley Stephens [id number] [birth year] 44 y.o.  Virtual Visit via Telephone Note  I connected with pt on 09/15/19 at  9:00 AM EST by telephone and verified that I am speaking with the correct person using two identifiers.   I discussed the limitations, risks, security and privacy concerns of performing an evaluation and management service by telephone and the availability of in person appointments. I also discussed with the patient that there may be a patient responsible charge related to this service. The patient expressed understanding and agreed to proceed.   I discussed the assessment and treatment plan with the patient. The patient was provided an opportunity to ask questions and all were answered. The patient agreed with the plan and demonstrated an understanding of the instructions.   The patient was advised to call back or seek an in-person evaluation if the symptoms worsen or if the condition fails to improve as anticipated.  I provided 25 minutes of non-face-to-face time during this encounter.  The patient was located at home.  The provider was located at [facility].   [name], PMHNP   Subjective:   Patient ID:  Ashley Stephens is a 44 y.o. (DOB Nov 25, 1975) female.  Chief Complaint:  Chief Complaint  Patient presents with  . Depression  . Other    Irritability  . Anxiety    HPI Ashley Stephens presents for follow-up of mood disturbance, anxiety, and insomnia. She reports feelings of "frustration" and some increased depression. She reports that she has been thinking about her aunt (maternal figure) more through the holidays and with the anniversary of her death approaching in the next couple of months. She reports that loss of aunt/maternal figure has accentuated the conflict with her biological mother, especially after hurtful incident directed towards her and her children over Christmas- "I'm done." She reports that her mood has been  "blah," irritable, and "explosive" at times.  "I'm tired of being bullied." Energy has been low. Reports motivation has been very low. She reports that she is able to fall asleep with medication. Some difficulty staying asleep. Appetite has been ok. She reports that appetite has fluctuated and "a lot of emotional eating." Reports poor concentration that has caused increased challenges with completing work. Reports that her thoughts are anxious and thinking about various stressors. Reports panic attack last week. Denies impulsive or risky behavior. Denies recent mood lability and mood has been persistently low and frustrated. Feelings of hopelessness and passive death wishes. "I can't give up" because of her children. Denies SI.   Past medication trials: Wellbutrin Effexor Pristiq-effective Lamictal-effective for mood Latuda Xanax- Usually takes at night Propanolol- Effective Hydroxyzine Rexulti Gabapentin- prescribed for back pain. Dizziness, headaches.  Lithium Trileptal   Review of Systems:  Review of Systems  Musculoskeletal: Negative for gait problem.  Neurological: Negative for tremors.  Psychiatric/Behavioral:       Please refer to HPI    Medications: I have reviewed the patient's current medications.  Current Outpatient Medications  Medication Sig Dispense Refill  . ALPRAZolam (XANAX) 0.25 MG tablet Take 1 tablet (0.25 mg total) by mouth 2 (two) times daily as needed for anxiety. 45 tablet 2  . Cyanocobalamin 1000 MCG/ML KIT Inject as directed.    . desvenlafaxine (PRISTIQ) 100 MG 24 hr tablet Take 1 tablet (100 mg total) by mouth every morning. 90 tablet 1  . desvenlafaxine (PRISTIQ) 50 MG 24 hr tablet Take 1 tablet (50 mg total) by mouth every evening. 90 tablet 1  .  hydrOXYzine (VISTARIL) 50 MG capsule Take 1 capsule by mouth three times daily as needed 90 capsule 1  . lamoTRIgine (LAMICTAL) 150 MG tablet Take 1 tablet (150 mg total) by mouth 2 (two) times daily. 180  tablet 1  . levothyroxine (SYNTHROID) 25 MCG tablet Take 1 tablet (25 mcg total) by mouth daily before breakfast. 30 tablet 2  . omeprazole (PRILOSEC) 40 MG capsule Take 1 capsule (40 mg total) by mouth 2 (two) times daily before a meal. 30 capsule 3  . Oxcarbazepine (TRILEPTAL) 300 MG tablet Take 1 tablet (300 mg total) by mouth at bedtime. 90 tablet 0  . sucralfate (CARAFATE) 1 GM/10ML suspension Take 10 mLs (1 g total) by mouth 4 (four) times daily -  with meals and at bedtime. 420 mL 0  . Vitamin D, Ergocalciferol, (DRISDOL) 1.25 MG (50000 UT) CAPS capsule Take 1 capsule (50,000 Units total) by mouth every 7 (seven) days. 6 capsule 0  . lurasidone (LATUDA) 80 MG TABS tablet Take 1 tablet (80 mg total) by mouth daily with breakfast. 30 tablet 3  . propranolol (INDERAL) 20 MG tablet Take 1 tablet (20 mg total) by mouth 3 (three) times daily as needed. 270 tablet 1   No current facility-administered medications for this visit.    Medication Side Effects: None  Allergies: No Known Allergies  Past Medical History:  Diagnosis Date  . Anxiety   . Depression   . Gastric ulcer   . GERD (gastroesophageal reflux disease)   . Hypertension   . Thyroid disorder     Family History  Problem Relation Age of Onset  . Breast cancer Maternal Grandmother     Social History   Socioeconomic History  . Marital status: Single    Spouse name: Not on file  . Number of children: Not on file  . Years of education: Not on file  . Highest education level: Not on file  Occupational History  . Not on file  Tobacco Use  . Smoking status: Never Smoker  . Smokeless tobacco: Never Used  Substance and Sexual Activity  . Alcohol use: No    Alcohol/week: 0.0 standard drinks  . Drug use: No  . Sexual activity: Yes    Birth control/protection: Surgical  Other Topics Concern  . Not on file  Social History Narrative  . Not on file   Social Determinants of Health   Financial Resource Strain:   .  Difficulty of Paying Living Expenses: Not on file  Food Insecurity:   . Worried About Charity fundraiser in the Last Year: Not on file  . Ran Out of Food in the Last Year: Not on file  Transportation Needs:   . Lack of Transportation (Medical): Not on file  . Lack of Transportation (Non-Medical): Not on file  Physical Activity:   . Days of Exercise per Week: Not on file  . Minutes of Exercise per Session: Not on file  Stress:   . Feeling of Stress : Not on file  Social Connections:   . Frequency of Communication with Friends and Family: Not on file  . Frequency of Social Gatherings with Friends and Family: Not on file  . Attends Religious Services: Not on file  . Active Member of Clubs or Organizations: Not on file  . Attends Archivist Meetings: Not on file  . Marital Status: Not on file  Intimate Partner Violence:   . Fear of Current or Ex-Partner: Not on file  . Emotionally  Abused: Not on file  . Physically Abused: Not on file  . Sexually Abused: Not on file    Past Medical History, Surgical history, Social history, and Family history were reviewed and updated as appropriate.   Please see review of systems for further details on the patient's review from today.   Objective:   Physical Exam:  There were no vitals taken for this visit.  Physical Exam Neurological:     Mental Status: She is alert and oriented to person, place, and time.     Cranial Nerves: No dysarthria.  Psychiatric:        Attention and Perception: Attention and perception normal.        Speech: Speech normal.        Behavior: Behavior is cooperative.        Thought Content: Thought content normal. Thought content is not paranoid or delusional. Thought content does not include homicidal or suicidal ideation. Thought content does not include homicidal or suicidal plan.        Cognition and Memory: Cognition and memory normal.        Judgment: Judgment normal.     Comments: Insight  intact Mood is appropriate to content. Mood presents as sad when discussing losses     Lab Review:     Component Value Date/Time   NA 139 07/10/2019 0953   NA 142 12/03/2011 1059   K 5.0 07/10/2019 0953   K 3.4 (L) 12/03/2011 1059   CL 101 07/10/2019 0953   CL 107 12/03/2011 1059   CO2 24 07/10/2019 0953   CO2 27 12/03/2011 1059   GLUCOSE 82 07/10/2019 0953   GLUCOSE 105 (H) 02/05/2018 1045   GLUCOSE 66 12/03/2011 1059   BUN 9 07/10/2019 0953   BUN 4 (L) 12/03/2011 1059   CREATININE 1.01 (H) 07/10/2019 0953   CREATININE 0.82 12/03/2011 1059   CALCIUM 9.5 07/10/2019 0953   CALCIUM 8.5 12/03/2011 1059   PROT 6.5 07/10/2019 0953   ALBUMIN 4.2 07/10/2019 0953   AST 17 07/10/2019 0953   ALT 12 07/10/2019 0953   ALKPHOS 80 07/10/2019 0953   BILITOT <0.2 07/10/2019 0953   GFRNONAA 67 07/10/2019 0953   GFRNONAA >60 12/03/2011 1059   GFRAA 78 07/10/2019 0953   GFRAA >60 12/03/2011 1059       Component Value Date/Time   WBC 4.9 07/10/2019 0953   WBC 4.7 02/05/2018 1045   RBC 4.77 07/10/2019 0953   RBC 4.65 02/05/2018 1045   HGB 13.5 07/10/2019 0953   HCT 41.0 07/10/2019 0953   PLT 340 07/10/2019 0953   MCV 86 07/10/2019 0953   MCH 28.3 07/10/2019 0953   MCH 28.8 02/05/2018 1045   MCHC 32.9 07/10/2019 0953   MCHC 33.9 02/05/2018 1045   RDW 14.0 07/10/2019 0953   LYMPHSABS 1.4 02/05/2018 1045   LYMPHSABS 1.8 12/27/2017 0740   MONOABS 0.4 02/05/2018 1045   EOSABS 0.1 02/05/2018 1045   EOSABS 0.1 12/27/2017 0740   BASOSABS 0.0 02/05/2018 1045   BASOSABS 0.0 12/27/2017 0740    No results found for: POCLITH, LITHIUM   No results found for: PHENYTOIN, PHENOBARB, VALPROATE, CBMZ   .res Assessment: Plan:   Pt seen for 30 minutes and time spent counseling pt re: responses to recent events and provided validation and support. Discussed that her feelings of loss and frustration are appropriate after accepting that a relationship is not healthy for her and deciding to  limit contact.  Pt reports that she  would prefer not to decrease any medication at this time since she feels that medications have been helpful for s/s and allowed her to maintain current level of function. Reports that long-term goal would be to reduce medication if possible. Continue Latuda, Lamictal, and Trileptal for mood s/s.  Continue Pristiq 150 mg po qd for mood and anxiety.  Continue Hydroxyzine and Propranolol prn anxiety. Recommend continuing psychotherapy for Ashley Cloud, LCSW. Pt to f/u with this provider in 4 weeks or sooner if clinically indicated.  Patient advised to contact office with any questions, adverse effects, or acute worsening in signs and symptoms.  Ashley Stephens was seen today for depression, other and anxiety.  Diagnoses and all orders for this visit:  Episodic mood disorder ([place])  Generalized anxiety disorder  Primary insomnia    Please see After Visit Summary for patient specific instructions.  Future Appointments  Date Time Provider [place]  09/25/2019  8:00 AM [name], LCSW [facility] None  10/09/2019  8:00 AM [name], LCSW [facility] None  10/16/2019 10:00 AM [name], PMHNP [facility] None  10/23/2019  8:00 AM [name], LCSW [facility] None  10/30/2019  8:30 AM Ronnell Freshwater, NP [facility] None  11/06/2019  8:00 AM [name], LCSW [facility] None  11/20/2019  9:00 AM [name], LCSW [facility] None  12/11/2019  8:00 AM [name], LCSW [facility] None  12/25/2019  8:00 AM [name], LCSW [facility] None    No orders of the defined types were placed in this encounter.     -------------------------------

## 2019-09-25 ENCOUNTER — Other Ambulatory Visit: Payer: Self-pay

## 2019-09-25 ENCOUNTER — Ambulatory Visit (INDEPENDENT_AMBULATORY_CARE_PROVIDER_SITE_OTHER): Payer: Managed Care, Other (non HMO) | Admitting: Psychiatry

## 2019-09-25 DIAGNOSIS — F411 Generalized anxiety disorder: Secondary | ICD-10-CM | POA: Diagnosis not present

## 2019-09-25 DIAGNOSIS — R69 Illness, unspecified: Secondary | ICD-10-CM | POA: Diagnosis not present

## 2019-09-25 NOTE — Progress Notes (Signed)
      [facility] Counselor/Therapist Progress Note  Patient ID: Ashley Stephens, MRN: [id number],    Date: 09/25/2019  Time Spent: 60 minutes 8:00am to 9:00am  Treatment Type: Individual Therapy  Reported Symptoms: anxiety, depression, sadness (another aunt just died)  Mental Status Exam:  Appearance:   Casual     Behavior:  Appropriate and Sharing  Motor:  Normal  Speech/Language:   Normal Rate  Affect:  Depressed, Tearful and anxious  Mood:  anxious, depressed and sad  Thought process:  goal directed  Thought content:    WNL  Sensory/Perceptual disturbances:    WNL  Orientation:  oriented to person, place, time/date, situation, day of week, month of year and year  Attention:  Good  Concentration:  Fair  Memory:  WNL  Fund of knowledge:   Good  Insight:    Good  Judgment:   Good  Impulse Control:  Good   Risk Assessment: Danger to Self:  No Self-injurious Behavior: No Danger to Others: No Duty to Warn:no Physical Aggression / Violence:No  Access to Firearms a concern: No  Gang Involvement:No   Subjective: Patient today reporting depression and anxiety.  Death of another aunt past week.   Interventions: Cognitive Behavioral Therapy and Solution-Oriented/Positive Psychology; Grief therapy  Diagnosis:   ICD-10-CM   1. Generalized anxiety disorder  F41.1     Plan: Patient not signing tx plan on computer screen due to [place].  Treatment Goals: Patient not signing tx plan on computer screen due to [place].  Long term goal: Develop the ability to recognize, accept, and cope with feelings of depression and anxiety.  Short term goal: Learn and implement personal skills for managing stress, solving daily problems, and resolving conflicts effectively.  Strategy: Teach patient calming strategies, problem solving skills, and conflict resolution skills to better manage daily stressors.  Progressing- Patient in today with increased anxiety and  depression that she reports is due to the loss this past week of another aunt. Very tearful for a while as she processed this recent death, as well as other family concerns that came up as she spoke.  Some family issues complicating her grief.  Shared more about the family issues and how they are impacting her now in the midst of grief.  Very emotional and eventually leveled out and discussed what can help support her right now including being in touch with both of her children, other friends and family members, take meds as prescribed, try to get some exercise, and consider limiting contact with certain people that she feels are not helpful to her.  Denies any SI. Was calmer and more grounded by session end.  Goal review and some progress noted with patient.    Next appt within 2 weeks.   [name], LCSW

## 2019-09-30 ENCOUNTER — Other Ambulatory Visit: Payer: Self-pay

## 2019-09-30 DIAGNOSIS — E039 Hypothyroidism, unspecified: Secondary | ICD-10-CM

## 2019-09-30 MED ORDER — LEVOTHYROXINE SODIUM 25 MCG PO TABS: 25.0000 ug | ORAL_TABLET | Freq: Every day | ORAL | 2 refills | Status: DC

## 2019-10-09 ENCOUNTER — Ambulatory Visit (INDEPENDENT_AMBULATORY_CARE_PROVIDER_SITE_OTHER): Payer: Managed Care, Other (non HMO) | Admitting: Psychiatry

## 2019-10-09 ENCOUNTER — Other Ambulatory Visit: Payer: Self-pay

## 2019-10-09 DIAGNOSIS — R69 Illness, unspecified: Secondary | ICD-10-CM | POA: Diagnosis not present

## 2019-10-09 DIAGNOSIS — F411 Generalized anxiety disorder: Secondary | ICD-10-CM

## 2019-10-09 NOTE — Progress Notes (Signed)
[facility] Counselor/Therapist Progress Note  Patient ID: Ashley Stephens, MRN: [id number],    Date: 10/09/2019  Time Spent: 60 minutes  8:00am to 9:00am   Treatment Type: Individual Therapy  Reported Symptoms: anxiety, depressed, stressed "but all are some better" more recently  Mental Status Exam:  Appearance:   Casual     Behavior:  Appropriate and Sharing  Motor:  Normal  Speech/Language:   Normal Rate  Affect:  anxious,  Mood:  anxious and depressed "but better"  Thought process:  normal  Thought content:    WNL  Sensory/Perceptual disturbances:    WNL  Orientation:  oriented to person, place, time/date, situation, day of week, month of year and year  Attention:  Good  Concentration:  Good  Memory:  WNL  Fund of knowledge:   Good  Insight:    Good  Judgment:   Good  Impulse Control:  Good   Risk Assessment: Danger to Self:  No Self-injurious Behavior: No Danger to Others: No Duty to Warn:no Physical Aggression / Violence:No  Access to Firearms a concern: No  Gang Involvement:No   Subjective: Patient reports feeling some more stability since last appt.  A bit more structured and not as volatile.  A little less anxious.    Interventions: Cognitive Behavioral Therapy and Solution-Oriented/Positive Psychology  Diagnosis:   ICD-10-CM   1. Generalized anxiety disorder  F41.1     Plan: Patient not signing tx plan on computer screen due to [place].  Treatment Goals: Patient not signing tx plan on computer screen due to [place].  Long term goal: Develop the ability to recognize, accept, and cope with feelings of depression and anxiety.  Short term goal: Learn and implement personal skills for managing stress, solving daily problems, and resolving conflicts effectively.  Strategy: Teach patient calming strategies, problem solving skills, and conflict resolution skills to better manage daily stressors.  Progressing- Patient reports being  less volatile emotionally most recently---sometimes intentionally and sometimes "it just happened". Recognizing some of rigidity in what she wants from others including when she is interacting with them, especially with her mother.  Very "black and white" with some view in situation with little flexibility, "and adds I'm good with being this way." Did make some better choices in situations within family where she chose to not react strongly during time of funeral for an aunt. Reports "I just let it go" but per conversation today, some of her feelings are still very present. "Work is always crazy and demanding and that stress never changes." Discussed some issues she is having with her 76 yr old son and 71 yr old daughter. Helped patient see how her anxious thoughts interfere with her feeling calmer and better handling stress.  Wanted to process some issues of forgiveness in relation to her adult children and added I just can't forget some things. I challenged her to consider working on "forgiving what she feels she can't forget" and she agreed "I need to do that" and "I can forgive myself". Commits to focus on what she feels she can't forget but needs to forgive.  To remain on meds as prescribed, include some exercise in her routine, continue to work on setting good boundaries especially with people that she finds contact with to be unhealthy. Goal review and progress noted with patient.  Next appt within 2 weeks.   [name], LCSW

## 2019-10-16 ENCOUNTER — Ambulatory Visit (INDEPENDENT_AMBULATORY_CARE_PROVIDER_SITE_OTHER): Payer: Managed Care, Other (non HMO) | Admitting: Psychiatry

## 2019-10-16 ENCOUNTER — Other Ambulatory Visit: Payer: Self-pay

## 2019-10-16 ENCOUNTER — Encounter: Payer: Self-pay | Admitting: Psychiatry

## 2019-10-16 DIAGNOSIS — F5101 Primary insomnia: Secondary | ICD-10-CM | POA: Diagnosis not present

## 2019-10-16 DIAGNOSIS — F39 Unspecified mood [affective] disorder: Secondary | ICD-10-CM

## 2019-10-16 DIAGNOSIS — R69 Illness, unspecified: Secondary | ICD-10-CM | POA: Diagnosis not present

## 2019-10-16 DIAGNOSIS — F411 Generalized anxiety disorder: Secondary | ICD-10-CM | POA: Diagnosis not present

## 2019-10-16 MED ORDER — LURASIDONE HCL 80 MG PO TABS: 80.0000 mg | ORAL_TABLET | Freq: Every day | ORAL | 3 refills | Status: DC

## 2019-10-16 MED ORDER — DESVENLAFAXINE SUCCINATE ER 100 MG PO TB24: 100.0000 mg | ORAL_TABLET | ORAL | 1 refills | Status: DC

## 2019-10-16 MED ORDER — OXCARBAZEPINE 300 MG PO TABS: 300.0000 mg | ORAL_TABLET | Freq: Every day | ORAL | 0 refills | Status: DC

## 2019-10-16 MED ORDER — PROPRANOLOL HCL 20 MG PO TABS: 20.0000 mg | ORAL_TABLET | Freq: Three times a day (TID) | ORAL | 1 refills | Status: DC | PRN

## 2019-10-16 MED ORDER — HYDROXYZINE PAMOATE 50 MG PO CAPS: ORAL_CAPSULE | ORAL | 1 refills | Status: DC

## 2019-10-16 MED ORDER — DESVENLAFAXINE SUCCINATE ER 50 MG PO TB24: 50.0000 mg | ORAL_TABLET | Freq: Every evening | ORAL | 1 refills | Status: DC
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
## 2019-10-23 ENCOUNTER — Ambulatory Visit (INDEPENDENT_AMBULATORY_CARE_PROVIDER_SITE_OTHER): Payer: Managed Care, Other (non HMO) | Admitting: Psychiatry

## 2019-10-23 DIAGNOSIS — F411 Generalized anxiety disorder: Secondary | ICD-10-CM

## 2019-10-23 DIAGNOSIS — R69 Illness, unspecified: Secondary | ICD-10-CM | POA: Diagnosis not present
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
## 2019-10-28 ENCOUNTER — Telehealth: Payer: Self-pay

## 2019-10-28 NOTE — Telephone Encounter (Signed)
Screened and confirmed for 10-30-19 ov.

## 2019-10-30 ENCOUNTER — Ambulatory Visit: Payer: 59 | Admitting: Nurse Practitioner

## 2019-11-03 DIAGNOSIS — L700 Acne vulgaris: Secondary | ICD-10-CM | POA: Diagnosis not present

## 2019-11-05 ENCOUNTER — Telehealth: Payer: Self-pay

## 2019-11-05 NOTE — Telephone Encounter (Signed)
BILLED MISSED APPT FEE FOR 10/30/2019

## 2019-11-06 ENCOUNTER — Ambulatory Visit (INDEPENDENT_AMBULATORY_CARE_PROVIDER_SITE_OTHER): Payer: 59 | Admitting: Psychiatry

## 2019-11-06 ENCOUNTER — Other Ambulatory Visit: Payer: Self-pay

## 2019-11-06 DIAGNOSIS — F411 Generalized anxiety disorder: Secondary | ICD-10-CM

## 2019-11-06 DIAGNOSIS — R69 Illness, unspecified: Secondary | ICD-10-CM | POA: Diagnosis not present
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
## 2019-11-13 DIAGNOSIS — M545 Low back pain: Secondary | ICD-10-CM | POA: Diagnosis not present

## 2019-11-13 DIAGNOSIS — Z6841 Body Mass Index (BMI) 40.0 and over, adult: Secondary | ICD-10-CM | POA: Diagnosis not present

## 2019-11-19 ENCOUNTER — Other Ambulatory Visit: Payer: Self-pay | Admitting: Orthopedic Surgery

## 2019-11-19 DIAGNOSIS — M545 Low back pain, unspecified: Secondary | ICD-10-CM

## 2019-11-20 ENCOUNTER — Ambulatory Visit (INDEPENDENT_AMBULATORY_CARE_PROVIDER_SITE_OTHER): Payer: 59 | Admitting: Psychiatry

## 2019-11-20 ENCOUNTER — Other Ambulatory Visit: Payer: Self-pay

## 2019-11-20 DIAGNOSIS — F411 Generalized anxiety disorder: Secondary | ICD-10-CM

## 2019-11-20 DIAGNOSIS — R69 Illness, unspecified: Secondary | ICD-10-CM | POA: Diagnosis not present

## 2019-11-20 DIAGNOSIS — M47816 Spondylosis without myelopathy or radiculopathy, lumbar region: Secondary | ICD-10-CM | POA: Diagnosis not present

## 2019-11-20 DIAGNOSIS — Z6841 Body Mass Index (BMI) 40.0 and over, adult: Secondary | ICD-10-CM | POA: Diagnosis not present
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
## 2019-11-25 ENCOUNTER — Telehealth: Payer: Self-pay

## 2019-11-25 ENCOUNTER — Other Ambulatory Visit: Payer: Self-pay | Admitting: Psychiatry

## 2019-11-25 DIAGNOSIS — F411 Generalized anxiety disorder: Secondary | ICD-10-CM

## 2019-11-25 NOTE — Telephone Encounter (Signed)
Apt 03/26

## 2019-11-25 NOTE — Telephone Encounter (Signed)
LMOM FOR PATIENT TO CONFIRM AND SCREEN FOR 11-27-19 OV.

## 2019-11-27 ENCOUNTER — Ambulatory Visit: Payer: 59 | Admitting: Nurse Practitioner

## 2019-11-27 ENCOUNTER — Ambulatory Visit (INDEPENDENT_AMBULATORY_CARE_PROVIDER_SITE_OTHER): Payer: 59 | Admitting: Psychiatry

## 2019-11-27 ENCOUNTER — Other Ambulatory Visit: Payer: Self-pay

## 2019-11-27 ENCOUNTER — Encounter: Payer: Self-pay | Admitting: Psychiatry

## 2019-11-27 DIAGNOSIS — F5101 Primary insomnia: Secondary | ICD-10-CM | POA: Diagnosis not present

## 2019-11-27 DIAGNOSIS — R69 Illness, unspecified: Secondary | ICD-10-CM | POA: Diagnosis not present

## 2019-11-27 DIAGNOSIS — F39 Unspecified mood [affective] disorder: Secondary | ICD-10-CM | POA: Diagnosis not present

## 2019-11-27 DIAGNOSIS — F411 Generalized anxiety disorder: Secondary | ICD-10-CM | POA: Diagnosis not present

## 2019-11-27 MED ORDER — ALPRAZOLAM 0.25 MG PO TABS: 0.2500 mg | ORAL_TABLET | Freq: Two times a day (BID) | ORAL | 2 refills | Status: DC | PRN

## 2019-11-27 MED ORDER — OXCARBAZEPINE 300 MG PO TABS: 300.0000 mg | ORAL_TABLET | Freq: Every day | ORAL | 0 refills | Status: DC

## 2019-11-27 NOTE — Progress Notes (Signed)
Nemiah Bubar [id number] 10-24-1975 43 y.o.  Subjective:   Patient ID:  Ashley Stephens is a 1 y.o. (DOB 08-06-1976) female.  Chief Complaint:  Chief Complaint  Patient presents with  . Depression  . Anxiety    HPI Ashley Stephens presents to the office today for follow-up of anxiety, depression, and mood disturbance. She reports that she has been, "more stressed, more depressed" in response to work and family stressors. She reports that depression has been worse overall and has had persistent depressed mood. Occasional irritability. "Everything is all over the place" and describes mood lability. Reports that she is anxious about "what is going to happen." Denies physical s/s of anxiety or recent panic attacks. Has been sleeping ok. Appetite has been up and down. She continues to have some emotional eating. Energy and motivation have been "so,so." She reports that some days she is staying in bed. She reports that she has missed work 1-2 times a week due to severe depression since last visit and it was previously about 1-3 days a month that she would miss work. Concentration difficulties. Reports that she had SI around her birthday. Denies current SI.   She reports that she was disappointed by family's response to her birthday. Reports that she stays in her room 99% of the time. March 8th was anniversary of aunt's birthday and this was very difficult for her.   Past medication trials: Wellbutrin Effexor Pristiq-effective Lamictal-effective for mood Latuda Xanax- Usually takes at night Propanolol- Effective Hydroxyzine Rexulti Gabapentin- prescribed for back pain. Dizziness, headaches.  Lithium Trileptal  AIMS     Office Visit from 11/27/2019 in [place] Visit from 08/14/2019 in [place] Visit from 05/29/2019 in [place] Total Score  0  0  0    PHQ2-9     Office Visit from  06/26/2019 in [facility], [facility] Office Visit from 10/03/2018 in [facility], [facility] Office Visit from 06/20/2018 in [facility], [facility] Office Visit from 12/13/2017 in [facility], [facility] Office Visit from 09/22/2015 in [facility] at [facility] Total Score  0  0  2  0  0  PHQ-9 Total Score  --  --  15  --  --       Review of Systems:  Review of Systems  Musculoskeletal: Positive for back pain. Negative for gait problem.  Neurological: Negative for tremors.  Psychiatric/Behavioral:       Please refer to HPI    Medications: I have reviewed the patient's current medications.  Current Outpatient Medications  Medication Sig Dispense Refill  . [START ON 12/24/2019] ALPRAZolam (XANAX) 0.25 MG tablet Take 1 tablet (0.25 mg total) by mouth 2 (two) times daily as needed. for anxiety 45 tablet 2  . Cyanocobalamin 1000 MCG/ML KIT Inject as directed.    . desvenlafaxine (PRISTIQ) 100 MG 24 hr tablet Take 1 tablet (100 mg total) by mouth every morning. 90 tablet 1  . desvenlafaxine (PRISTIQ) 50 MG 24 hr tablet Take 1 tablet (50 mg total) by mouth every evening. 90 tablet 1  . hydrOXYzine (VISTARIL) 50 MG capsule Take 1 capsule by mouth three times daily as needed 90 capsule 1  . lamoTRIgine (LAMICTAL) 150 MG tablet Take 1 tablet (150 mg total) by mouth 2 (two) times daily. 180 tablet 1  . levothyroxine (SYNTHROID) 25 MCG tablet Take 1 tablet (25 mcg total) by mouth daily before breakfast. 30 tablet 2  .  omeprazole (PRILOSEC) 40 MG capsule Take 1 capsule (40 mg total) by mouth 2 (two) times daily before a meal. 30 capsule 3  . Oxcarbazepine (TRILEPTAL) 300 MG tablet Take 1 tablet (300 mg total) by mouth at bedtime. 90 tablet 0  . propranolol (INDERAL) 20 MG tablet Take 1 tablet (20 mg total) by mouth 3 (three) times daily as needed. 270 tablet 1  . sucralfate (CARAFATE) 1 GM/10ML suspension Take 10 mLs (1 g total) by mouth 4 (four) times daily -  with meals and  at bedtime. 420 mL 0  . Vitamin D, Ergocalciferol, (DRISDOL) 1.25 MG (50000 UT) CAPS capsule Take 1 capsule (50,000 Units total) by mouth every 7 (seven) days. 6 capsule 0  . cyclobenzaprine (FLEXERIL) 5 MG tablet     . lurasidone (LATUDA) 80 MG TABS tablet Take 1 tablet (80 mg total) by mouth daily with breakfast. 30 tablet 3   No current facility-administered medications for this visit.    Medication Side Effects: None  Allergies: No Known Allergies  Past Medical History:  Diagnosis Date  . Anxiety   . Depression   . Gastric ulcer   . GERD (gastroesophageal reflux disease)   . Hypertension   . Thyroid disorder     Family History  Problem Relation Age of Onset  . Breast cancer Maternal Grandmother     Social History   Socioeconomic History  . Marital status: Single    Spouse name: Not on file  . Number of children: Not on file  . Years of education: Not on file  . Highest education level: Not on file  Occupational History  . Not on file  Tobacco Use  . Smoking status: Never Smoker  . Smokeless tobacco: Never Used  Substance and Sexual Activity  . Alcohol use: No    Alcohol/week: 0.0 standard drinks  . Drug use: No  . Sexual activity: Yes    Birth control/protection: Surgical  Other Topics Concern  . Not on file  Social History Narrative  . Not on file   Social Determinants of Health   Financial Resource Strain:   . Difficulty of Paying Living Expenses:   Food Insecurity:   . Worried About Charity fundraiser in the Last Year:   . Arboriculturist in the Last Year:   Transportation Needs:   . Film/video editor (Medical):   Marland Kitchen Lack of Transportation (Non-Medical):   Physical Activity:   . Days of Exercise per Week:   . Minutes of Exercise per Session:   Stress:   . Feeling of Stress :   Social Connections:   . Frequency of Communication with Friends and Family:   . Frequency of Social Gatherings with Friends and Family:   . Attends Religious  Services:   . Active Member of Clubs or Organizations:   . Attends Archivist Meetings:   Marland Kitchen Marital Status:   Intimate Partner Violence:   . Fear of Current or Ex-Partner:   . Emotionally Abused:   Marland Kitchen Physically Abused:   . Sexually Abused:     Past Medical History, Surgical history, Social history, and Family history were reviewed and updated as appropriate.   Please see review of systems for further details on the patient's review from today.   Objective:   Physical Exam:  BP 105/64   Pulse 67   Physical Exam Constitutional:      General: She is not in acute distress. Musculoskeletal:  General: No deformity.  Neurological:     Mental Status: She is alert and oriented to person, place, and time.     Coordination: Coordination normal.  Psychiatric:        Attention and Perception: Attention and perception normal. She does not perceive auditory or visual hallucinations.        Mood and Affect: Mood is anxious and depressed. Affect is not labile, blunt, angry or inappropriate.        Speech: Speech normal.        Behavior: Behavior normal.        Thought Content: Thought content normal. Thought content is not paranoid or delusional. Thought content does not include homicidal or suicidal ideation. Thought content does not include homicidal or suicidal plan.        Cognition and Memory: Cognition and memory normal.        Judgment: Judgment normal.     Comments: Insight intact     Lab Review:     Component Value Date/Time   NA 139 07/10/2019 0953   NA 142 12/03/2011 1059   K 5.0 07/10/2019 0953   K 3.4 (L) 12/03/2011 1059   CL 101 07/10/2019 0953   CL 107 12/03/2011 1059   CO2 24 07/10/2019 0953   CO2 27 12/03/2011 1059   GLUCOSE 82 07/10/2019 0953   GLUCOSE 105 (H) 02/05/2018 1045   GLUCOSE 66 12/03/2011 1059   BUN 9 07/10/2019 0953   BUN 4 (L) 12/03/2011 1059   CREATININE 1.01 (H) 07/10/2019 0953   CREATININE 0.82 12/03/2011 1059   CALCIUM 9.5  07/10/2019 0953   CALCIUM 8.5 12/03/2011 1059   PROT 6.5 07/10/2019 0953   ALBUMIN 4.2 07/10/2019 0953   AST 17 07/10/2019 0953   ALT 12 07/10/2019 0953   ALKPHOS 80 07/10/2019 0953   BILITOT <0.2 07/10/2019 0953   GFRNONAA 67 07/10/2019 0953   GFRNONAA >60 12/03/2011 1059   GFRAA 78 07/10/2019 0953   GFRAA >60 12/03/2011 1059       Component Value Date/Time   WBC 4.9 07/10/2019 0953   WBC 4.7 02/05/2018 1045   RBC 4.77 07/10/2019 0953   RBC 4.65 02/05/2018 1045   HGB 13.5 07/10/2019 0953   HCT 41.0 07/10/2019 0953   PLT 340 07/10/2019 0953   MCV 86 07/10/2019 0953   MCH 28.3 07/10/2019 0953   MCH 28.8 02/05/2018 1045   MCHC 32.9 07/10/2019 0953   MCHC 33.9 02/05/2018 1045   RDW 14.0 07/10/2019 0953   LYMPHSABS 1.4 02/05/2018 1045   LYMPHSABS 1.8 12/27/2017 0740   MONOABS 0.4 02/05/2018 1045   EOSABS 0.1 02/05/2018 1045   EOSABS 0.1 12/27/2017 0740   BASOSABS 0.0 02/05/2018 1045   BASOSABS 0.0 12/27/2017 0740    No results found for: POCLITH, LITHIUM   No results found for: PHENYTOIN, PHENOBARB, VALPROATE, CBMZ   .res Assessment: Plan:   Pt reports that she would like to continue current medications without changes at this time since she attributes recent worsening in mood and anxiety s/s to stressors and anniversary of the death of her aunt/mother-figure. Plan is to continue current medications and f/u in one month and consider change in treatment if s/s do not improve or worsen.  Recommend continuing to see [name], LCSW.  Pt to f/u in 4 weeks or sooner if clinically indicated.  Patient advised to contact office with any questions, adverse effects, or acute worsening in signs and symptoms.  Ashley Stephens was seen today for  depression and anxiety.  Diagnoses and all orders for this visit:  Generalized anxiety disorder -     Oxcarbazepine (TRILEPTAL) 300 MG tablet; Take 1 tablet (300 mg total) by mouth at bedtime. -     ALPRAZolam (XANAX) 0.25 MG tablet; Take 1  tablet (0.25 mg total) by mouth 2 (two) times daily as needed. for anxiety  Mood disorder (HCC) -     Oxcarbazepine (TRILEPTAL) 300 MG tablet; Take 1 tablet (300 mg total) by mouth at bedtime.  Primary insomnia -     Oxcarbazepine (TRILEPTAL) 300 MG tablet; Take 1 tablet (300 mg total) by mouth at bedtime.     Please see After Visit Summary for patient specific instructions.  Future Appointments  Date Time Provider [place]  12/03/2019  9:00 AM Ronnell Freshwater, NP [facility] None  12/11/2019  8:00 AM [name], LCSW CP-CP None  12/18/2019  5:00 PM GI-315 MR 3 GI-315MRI [facility] [address] [facility]  12/25/2019  8:00 AM [name], LCSW CP-CP None  12/25/2019  9:15 AM [name], PMHNP CP-CP None  01/08/2020  8:00 AM [name], LCSW CP-CP None  01/22/2020  8:00 AM [name], LCSW CP-CP None    No orders of the defined types were placed in this encounter.   -------------------------------

## 2019-11-27 NOTE — Progress Notes (Signed)
   11/27/19 0855  Facial and Oral Movements  Muscles of Facial Expression 0  Lips and Perioral Area 0  Jaw 0  Tongue 0  Extremity Movements  Upper (arms, wrists, hands, fingers) 0  Lower (legs, knees, ankles, toes) 0  Trunk Movements  Neck, shoulders, hips 0  Overall Severity  Severity of abnormal movements (highest score from questions above) 0  Incapacitation due to abnormal movements 0  Patient's awareness of abnormal movements (rate only patient's report) 0  AIMS Total Score  AIMS Total Score 0

## 2019-12-01 ENCOUNTER — Telehealth: Payer: Self-pay

## 2019-12-01 NOTE — Telephone Encounter (Signed)
CONFIRMED AND SCREENED FOR 12-03-19 OV. 

## 2019-12-03 ENCOUNTER — Ambulatory Visit: Payer: 59 | Admitting: Nurse Practitioner

## 2019-12-03 ENCOUNTER — Other Ambulatory Visit: Payer: Self-pay

## 2019-12-03 ENCOUNTER — Encounter: Payer: Self-pay | Admitting: Nurse Practitioner

## 2019-12-03 VITALS — BP 120/74 | HR 75 | Temp 97.2°F | Resp 16 | Ht 67.0 in | Wt 275.6 lb

## 2019-12-03 DIAGNOSIS — E039 Hypothyroidism, unspecified: Secondary | ICD-10-CM

## 2019-12-03 DIAGNOSIS — E538 Deficiency of other specified B group vitamins: Secondary | ICD-10-CM | POA: Diagnosis not present

## 2019-12-03 DIAGNOSIS — E1165 Type 2 diabetes mellitus with hyperglycemia: Secondary | ICD-10-CM | POA: Diagnosis not present

## 2019-12-03 DIAGNOSIS — M5136 Other intervertebral disc degeneration, lumbar region: Secondary | ICD-10-CM

## 2019-12-03 LAB — POCT GLYCOSYLATED HEMOGLOBIN (HGB A1C): Hemoglobin A1C: 6.3 % — AB (ref 4.0–5.6)

## 2019-12-03 MED ORDER — CYANOCOBALAMIN 1000 MCG/ML IJ SOLN
1000.0000 ug | Freq: Once | INTRAMUSCULAR | Status: AC
  Administered 2019-12-03: 1000 ug via INTRAMUSCULAR

## 2019-12-03 MED ORDER — LEVOTHYROXINE SODIUM 25 MCG PO TABS: 25.0000 ug | ORAL_TABLET | Freq: Every day | ORAL | 2 refills | Status: DC

## 2019-12-03 NOTE — Progress Notes (Signed)
[facility] [place], [place] [address]  Internal MEDICINE  Office Visit Note  Patient Name: Ashley Stephens  [id number]  [id number]  Date of Service: 12/16/2019  Chief Complaint  Patient presents with  . Follow-up    The patient is here for follow up visit. She is borderline diabetic. Her HgbA1c 6.3 today. She is managing with diet.she also had herniated disc in lumbar spine. She started seeing orthopedic provider right before covid hit. Since then, provider had been managing with medications which has not helped too much. She is now back in with the provider. She is going to start getting injections and possibly physical therapy. She is concerned about four pound weigt gain. She states that she has been working, strictly from home since beginning of COVID 19 pandemic. Added to pain from herniated lumbar disc, she has gained the four pounds.  She states that she is otherwise doing well. She did review her labs, back 07/2019. She was vitamin d and vitamin b12 deficient. She continues to take weekly vitamin d. She received four vitamin b12 injection, once weekly for a month, but has not received any since then. She states that she did notice a little inmprovement in energy levels and overall sense of well being while taking the b12 injections .      Current Medication: Outpatient Encounter Medications as of 12/03/2019  Medication Sig  . [START ON 12/24/2019] ALPRAZolam (XANAX) 0.25 MG tablet Take 1 tablet (0.25 mg total) by mouth 2 (two) times daily as needed. for anxiety  . Cyanocobalamin 1000 MCG/ML KIT Inject as directed.  . cyclobenzaprine (FLEXERIL) 5 MG tablet   . desvenlafaxine (PRISTIQ) 100 MG 24 hr tablet Take 1 tablet (100 mg total) by mouth every morning.  . desvenlafaxine (PRISTIQ) 50 MG 24 hr tablet Take 1 tablet (50 mg total) by mouth every evening.  . hydrOXYzine (VISTARIL) 50 MG capsule Take 1 capsule by mouth three times daily as needed  .  lamoTRIgine (LAMICTAL) 150 MG tablet Take 1 tablet (150 mg total) by mouth 2 (two) times daily.  Marland Kitchen levothyroxine (SYNTHROID) 25 MCG tablet Take 1 tablet (25 mcg total) by mouth daily before breakfast.  . omeprazole (PRILOSEC) 40 MG capsule Take 1 capsule (40 mg total) by mouth 2 (two) times daily before a meal.  . Oxcarbazepine (TRILEPTAL) 300 MG tablet Take 1 tablet (300 mg total) by mouth at bedtime.  . propranolol (INDERAL) 20 MG tablet Take 1 tablet (20 mg total) by mouth 3 (three) times daily as needed.  . sucralfate (CARAFATE) 1 GM/10ML suspension Take 10 mLs (1 g total) by mouth 4 (four) times daily -  with meals and at bedtime.  . Vitamin D, Ergocalciferol, (DRISDOL) 1.25 MG (50000 UT) CAPS capsule Take 1 capsule (50,000 Units total) by mouth every 7 (seven) days.  . [DISCONTINUED] levothyroxine (SYNTHROID) 25 MCG tablet Take 1 tablet (25 mcg total) by mouth daily before breakfast.  . lurasidone (LATUDA) 80 MG TABS tablet Take 1 tablet (80 mg total) by mouth daily with breakfast.  . [EXPIRED] cyanocobalamin ((VITAMIN B-12)) injection 1,000 mcg    No facility-administered encounter medications on file as of 12/03/2019.    Surgical History: Past Surgical History:  Procedure Laterality Date  . ABDOMINAL HYSTERECTOMY  2013  . BARIATRIC SURGERY    . BREAST BIOPSY  1995  . GASTRIC BYPASS  2012  . TUBAL LIGATION  1996    Medical History: Past Medical History:  Diagnosis Date  .  Anxiety   . Depression   . Gastric ulcer   . GERD (gastroesophageal reflux disease)   . Hypertension   . Thyroid disorder     Family History: Family History  Problem Relation Age of Onset  . Breast cancer Maternal Grandmother     Social History   Socioeconomic History  . Marital status: Single    Spouse name: Not on file  . Number of children: Not on file  . Years of education: Not on file  . Highest education level: Not on file  Occupational History  . Not on file  Tobacco Use  . Smoking  status: Never Smoker  . Smokeless tobacco: Never Used  Substance and Sexual Activity  . Alcohol use: No    Alcohol/week: 0.0 standard drinks  . Drug use: No  . Sexual activity: Yes    Birth control/protection: Surgical  Other Topics Concern  . Not on file  Social History Narrative  . Not on file   Social Determinants of Health   Financial Resource Strain:   . Difficulty of Paying Living Expenses:   Food Insecurity:   . Worried About Charity fundraiser in the Last Year:   . Arboriculturist in the Last Year:   Transportation Needs:   . Film/video editor (Medical):   Marland Kitchen Lack of Transportation (Non-Medical):   Physical Activity:   . Days of Exercise per Week:   . Minutes of Exercise per Session:   Stress:   . Feeling of Stress :   Social Connections:   . Frequency of Communication with Friends and Family:   . Frequency of Social Gatherings with Friends and Family:   . Attends Religious Services:   . Active Member of Clubs or Organizations:   . Attends Archivist Meetings:   Marland Kitchen Marital Status:   Intimate Partner Violence:   . Fear of Current or Ex-Partner:   . Emotionally Abused:   Marland Kitchen Physically Abused:   . Sexually Abused:       Review of Systems  Constitutional: Positive for fatigue. Negative for chills and unexpected weight change.       Four pound weight gain since most recent visit.   HENT: Negative for congestion, postnasal drip, rhinorrhea, sneezing and sore throat.   Respiratory: Negative for apnea, cough, chest tightness, shortness of breath and wheezing.   Cardiovascular: Negative for chest pain and palpitations.  Gastrointestinal: Negative for abdominal pain, constipation, diarrhea, nausea and vomiting.  Endocrine: Negative for cold intolerance, heat intolerance, polydipsia and polyuria.       History of prediabetes .  Musculoskeletal: Positive for back pain. Negative for arthralgias, joint swelling and neck pain.  Skin: Negative for rash.   Neurological: Negative for dizziness, tremors, weakness, numbness and headaches.  Hematological: Negative for adenopathy. Does not bruise/bleed easily.  Psychiatric/Behavioral: Positive for dysphoric mood. Negative for behavioral problems (Depression), sleep disturbance and suicidal ideas. The patient is nervous/anxious.        Patient seeing psychiatrist and therapist.    Today's Vitals   12/03/19 0836  BP: 120/74  Pulse: 75  Resp: 16  Temp: (!) 97.2 F (36.2 C)  SpO2: 96%  Weight: 275 lb 9.6 oz (125 kg)  Height: _0  (1.702 m)   Body mass index is 43.17 kg/m.   Physical Exam Vitals and nursing note reviewed.  Constitutional:      General: She is not in acute distress.    Appearance: Normal appearance. She is well-developed.  She is obese. She is not diaphoretic.  HENT:     Head: Normocephalic and atraumatic.     Nose: Nose normal.     Mouth/Throat:     Pharynx: No oropharyngeal exudate.  Eyes:     Pupils: Pupils are equal, round, and reactive to light.  Neck:     Thyroid: No thyromegaly.     Vascular: No JVD.     Trachea: No tracheal deviation.  Cardiovascular:     Rate and Rhythm: Normal rate and regular rhythm.     Heart sounds: Normal heart sounds. No murmur. No friction rub. No gallop.   Pulmonary:     Effort: Pulmonary effort is normal. No respiratory distress.     Breath sounds: Normal breath sounds. No wheezing or rales.  Chest:     Chest wall: No tenderness.  Abdominal:     General: Bowel sounds are normal.     Palpations: Abdomen is soft.  Musculoskeletal:        General: Normal range of motion.     Cervical back: Normal range of motion and neck supple.  Lymphadenopathy:     Cervical: No cervical adenopathy.  Skin:    General: Skin is warm and dry.  Neurological:     Mental Status: She is alert and oriented to person, place, and time.     Cranial Nerves: No cranial nerve deficit.  Psychiatric:        Behavior: Behavior normal.        Thought  Content: Thought content normal.        Judgment: Judgment normal.    Assessment/Plan: 1. Type 2 diabetes mellitus with hyperglycemia, without long-term current use of insulin (HCC) - POCT HgB A1C 6.3 today. conitnue to control with diet and exercise   2. Acquired hypothyroidism Stable. Continue levothyroxine as prescribed.  - levothyroxine (SYNTHROID) 25 MCG tablet; Take 1 tablet (25 mcg total) by mouth daily before breakfast.  Dispense: 90 tablet; Refill: 2  3. DDD (degenerative disc disease), lumbar Orthopedics follow ups as scheduled.   4. B12 deficiency b12 injection administered today. - cyanocobalamin ((VITAMIN B-12)) injection 1,000 mcg  General Counseling: Mathew verbalizes understanding of the findings of todays visit and agrees with plan of treatment. I have discussed any further diagnostic evaluation that may be needed or ordered today. We also reviewed her medications today. she has been encouraged to call the office with any questions or concerns that should arise related to todays visit.  Diabetes Counseling:  1. Addition of ACE inh/ ARB'S for nephroprotection. Microalbumin is updated  2. Diabetic foot care, prevention of complications. Podiatry consult 3. Exercise and lose weight.  4. Diabetic eye examination, Diabetic eye exam is updated  5. Monitor blood sugar closlely. nutrition counseling.  6. Sign and symptoms of hypoglycemia including shaking sweating,confusion and headaches.  This patient was seen by Leretha Pol FNP Collaboration with Dr Lavera Guise as a part of collaborative care agreement  Orders Placed This Encounter  Procedures  . POCT HgB A1C    Meds ordered this encounter  Medications  . levothyroxine (SYNTHROID) 25 MCG tablet    Sig: Take 1 tablet (25 mcg total) by mouth daily before breakfast.    Dispense:  90 tablet    Refill:  2    Order Specific Question:   Supervising Provider    Answer:   Lavera Guise [place]  . cyanocobalamin  ((VITAMIN B-12)) injection 1,000 mcg    Total time spent: 30 Minutes  Time spent includes review of chart, medications, test results, and follow up plan with the patient.      Dr Lavera Guise Internal medicine

## 2019-12-11 ENCOUNTER — Ambulatory Visit (INDEPENDENT_AMBULATORY_CARE_PROVIDER_SITE_OTHER): Payer: 59 | Admitting: Psychiatry

## 2019-12-11 ENCOUNTER — Other Ambulatory Visit: Payer: Self-pay

## 2019-12-11 DIAGNOSIS — R69 Illness, unspecified: Secondary | ICD-10-CM | POA: Diagnosis not present

## 2019-12-11 DIAGNOSIS — F411 Generalized anxiety disorder: Secondary | ICD-10-CM

## 2019-12-11 NOTE — Progress Notes (Signed)
[facility] Counselor/Therapist Progress Note  Patient ID: Ashley Stephens, MRN: [id number],    Date: 12/11/2019  Time Spent: 60 minutes  8:00am to 9:00am  Treatment Type: Individual Therapy  Reported Symptoms: anxiety, depression, frustration, sadness  Mental Status Exam:  Appearance:   Casual     Behavior:  Agitated and angry, frustrated, blaming others, became calmer eventually   Motor:  agitated but calmed during session  Speech/Language:   Clear and Coherent  Affect:  Depressed and anxious, angry  Mood:  angry, anxious, depressed and irritable  Thought process:  goal directed  Thought content:    WNL  Sensory/Perceptual disturbances:    WNL  Orientation:  oriented to person, place, time/date, situation, day of week, month of year and year  Attention:  Good/Fair  Concentration:  Good/Fair  Memory:  WNL  Fund of knowledge:   Good  Insight:    Good/Fair  Judgment:   Good  Impulse Control:  Good/Fair   Risk Assessment: Danger to Self:  patient reports occasional SI and adds that she would not harm self ; adds that she has not plans to harm self either, when I questioned further Self-injurious Behavior: No Danger to Others: No Duty to Warn:no Physical Aggression / Violence:No  Access to Firearms a concern: No  Gang Involvement:No   Subjective: Patient today is more depressed and anxious due to    Interventions: Cognitive Behavioral Therapy and Ego-Supportive  Diagnosis:   ICD-10-CM   1. Generalized anxiety disorder  F41.1     Plan: Patient not signing tx plan on computer screen due to [place].  Treatment Goals: Patient not signing tx plan on computer screen due to [place].  Long term goal: Develop the ability to recognize, accept, and cope with feelings of depression and anxiety.  Short term goal: Learn and implement personal skills for managing stress, solving daily problems, and resolving conflicts effectively.  Strategy: Teach patient  calming strategies, problem solving skills, and conflict resolution skills to better manage daily stressors.  Progressing- Patient in today with increased depression and anxiety "due to things happening."  Tension with other family members "and cousin called and filled me in on a brother living with their mother was saying he was going to kill himself and how he would do it with a gun along with other details." Brother stated he was depressed about ex-girlfriend and health issues.  Said several times he was going to harm himself.  Police came and he ended up talking with them and did accept ride to hospital to be evaluated and admitted at [facility].  Patient involved and irritated, frustrated, and agitated in her involvement in this whole situation with various family members.  Venting profusely today and sharing thoughts and feelings again today of how she and brother is treated very differently by mother and hard for patient to work to move beyond this. Cluster of family lives in [place] area and patient states "they're all crazy" however finds it difficult not be be involved in their turmoil, and especially long-standing issues and resentment with her mother.  Patient did eventually become calmer and the agitation subsided, and she actually apologized for "being so angry and verbally offensive."  Acknowledged with her that she really needed to vent her feelings today and that has probably help her to get more calm, rational, and still some anger towards self and some of her family.  Mad at self "for letting myself gain weight".  Hard for  her to have appropriate boundaries within her family even though she knows she needs to move away from some of the family situations and interactions.  Discussed better self care through setting boundaries with others especially family, healthier nutrition, exercise (that she can do withhin limits due to back problem), stress management per our earlier conversation,  being in touch with supportive people, and more positive self-talk.  Denies SI at this time and knows how to access emergency services through hospital if needed.  Goal review and progress/challenges noted with patient.  Next appt within 2 weeks.   [name], LCSW

## 2019-12-16 DIAGNOSIS — M5136 Other intervertebral disc degeneration, lumbar region: Secondary | ICD-10-CM | POA: Insufficient documentation

## 2019-12-16 DIAGNOSIS — M51369 Other intervertebral disc degeneration, lumbar region without mention of lumbar back pain or lower extremity pain: Secondary | ICD-10-CM | POA: Insufficient documentation

## 2019-12-16 DIAGNOSIS — E538 Deficiency of other specified B group vitamins: Secondary | ICD-10-CM | POA: Insufficient documentation

## 2019-12-18 ENCOUNTER — Ambulatory Visit
Admission: RE | Admit: 2019-12-18 | Discharge: 2019-12-18 | Disposition: A | Discharge status: Home / Self Care | Payer: 59 | Source: Ambulatory Visit | Attending: Orthopedic Surgery | Admitting: Orthopedic Surgery

## 2019-12-18 ENCOUNTER — Other Ambulatory Visit: Payer: Self-pay

## 2019-12-18 DIAGNOSIS — M48061 Spinal stenosis, lumbar region without neurogenic claudication: Secondary | ICD-10-CM | POA: Diagnosis not present

## 2019-12-18 DIAGNOSIS — M545 Low back pain, unspecified: Secondary | ICD-10-CM

## 2019-12-18 IMAGING — MR MR LUMBAR SPINE W/O CM
4 of 5 series · 24 of 48 positions shown · non-contrast
Comparison: None.

CLINICAL DATA: Low back pain radiating to both buttocks

EXAM:
MRI LUMBAR SPINE WITHOUT CONTRAST
TECHNIQUE: Multiplanar, multisequence MR imaging of the lumbar spine was
performed. No intravenous contrast was administered.

[Series 2: T2 · sagittal · 4.0mm · 0.55mm/px · 6 of 16 slices shown (1 of 2)]
[im 1/16]
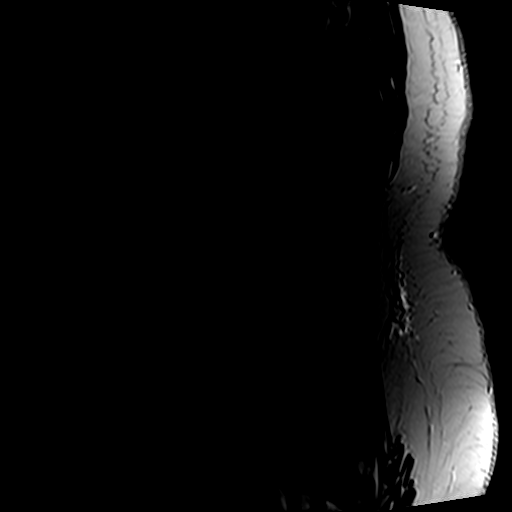
[im 4/16]
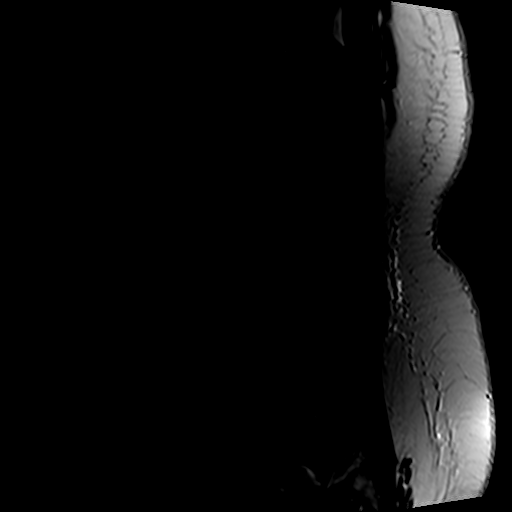
[im 7/16]
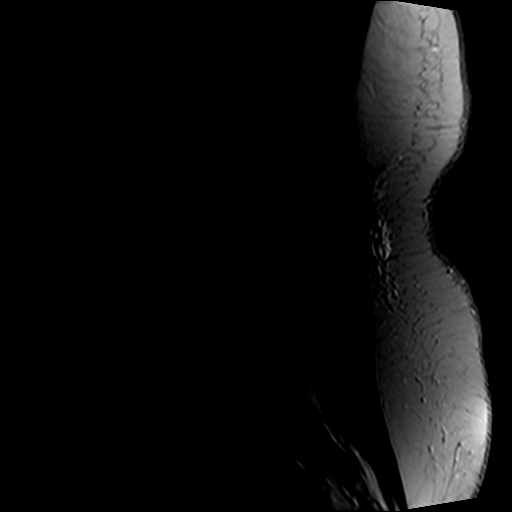
[im 10/16]
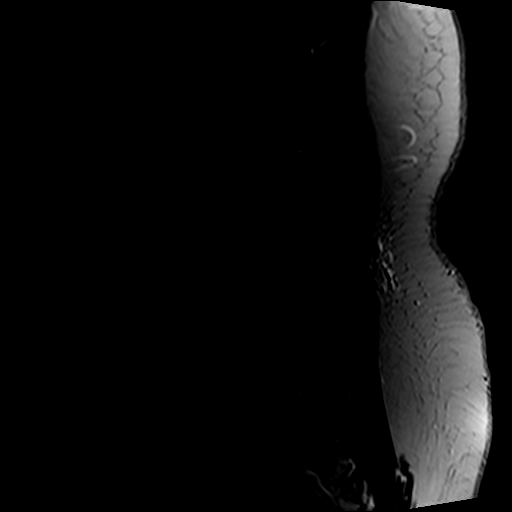
[im 13/16]
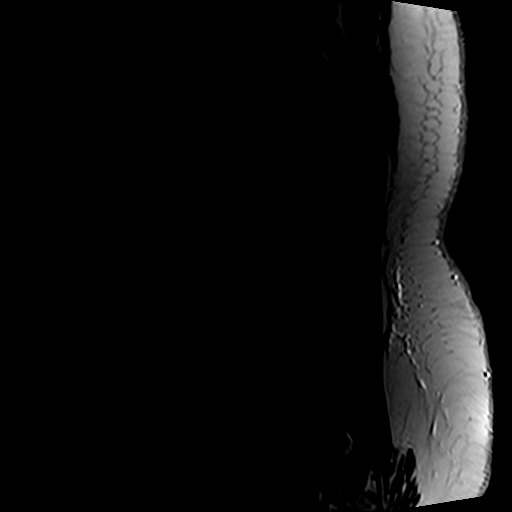
[im 16/16]
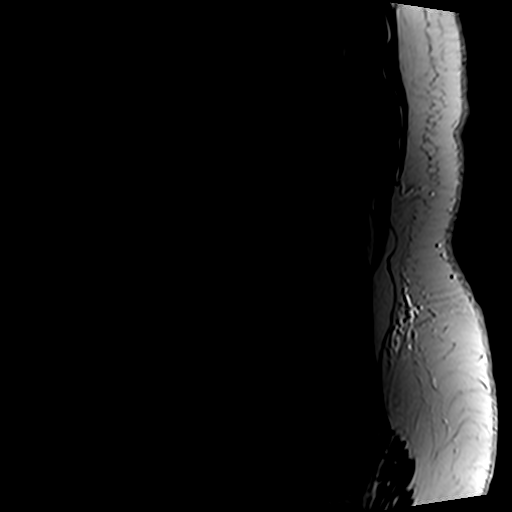

[Series 4: T1 · sagittal · 4.0mm · 0.55mm/px · 7 of 16 slices shown (1 of 2)]
[im 1/16]
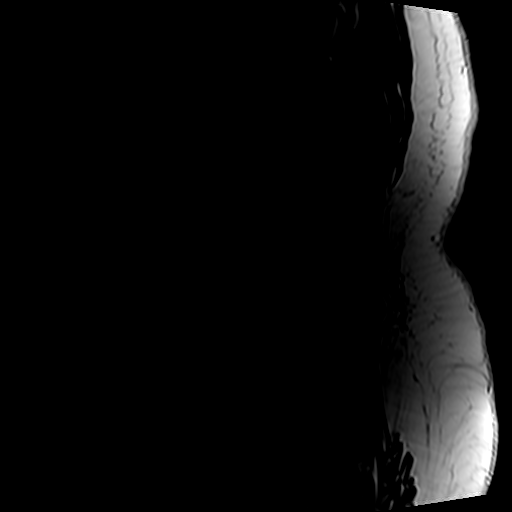
[im 3/16]
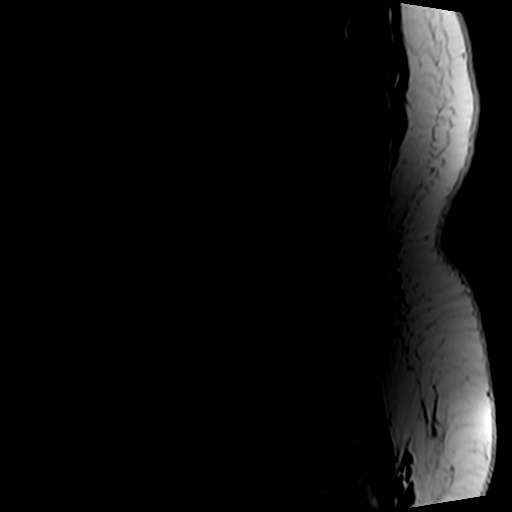
[im 6/16]
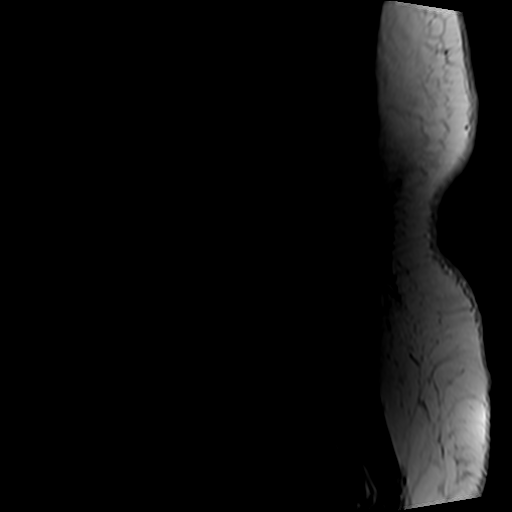
[im 8/16]
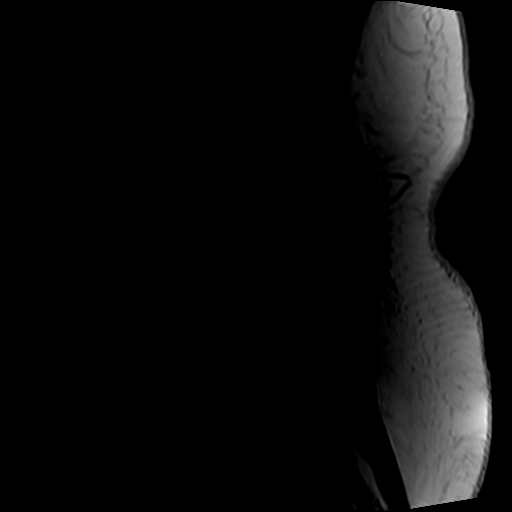
[im 11/16]
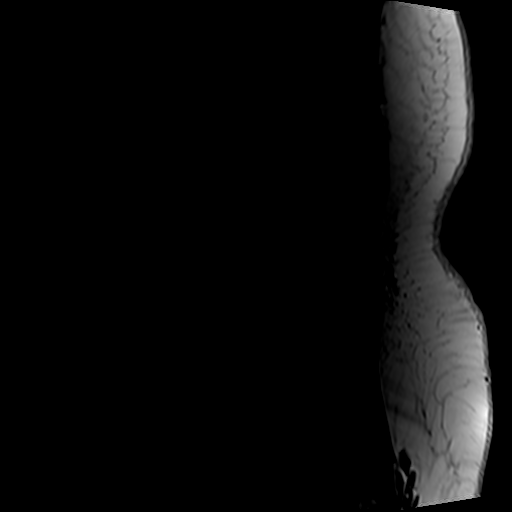
[im 13/16]
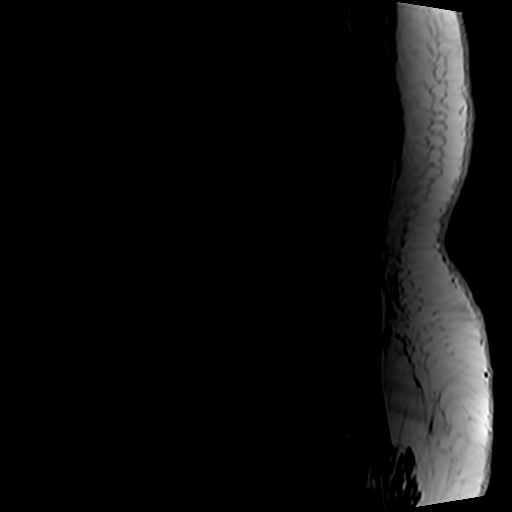
[im 16/16]
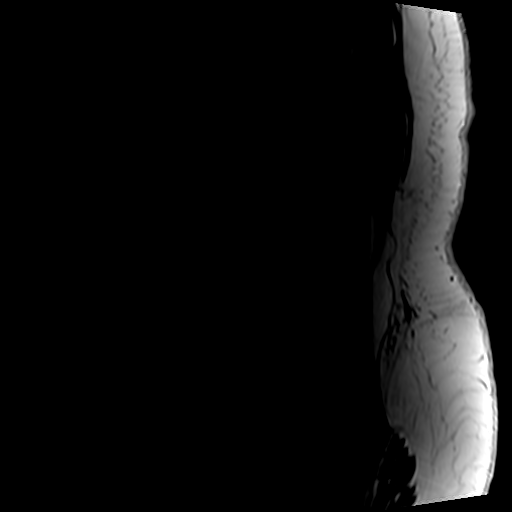

[Series 5: T2 · axial · 4.0mm · 0.70mm/px · z∈[-78,+115]mm · 8 of 35 slices shown (2 of 2)]
[im 1/35]
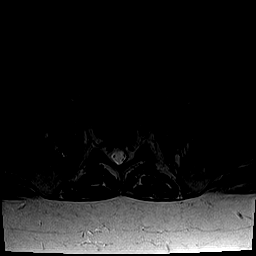
[im 6/35]
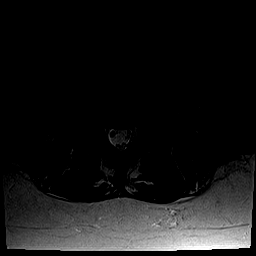
[im 11/35]
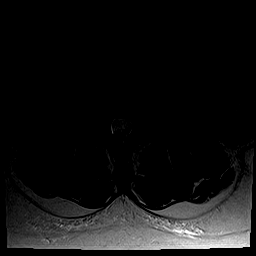
[im 16/35]
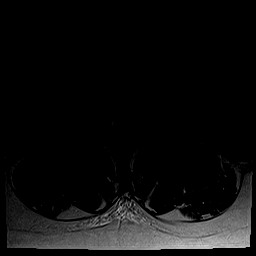
[im 19/35]
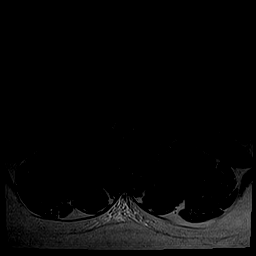
[im 24/35]
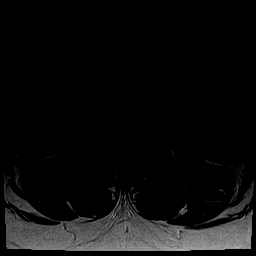
[im 29/35]
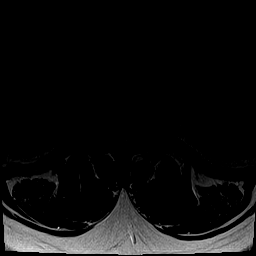
[im 35/35]
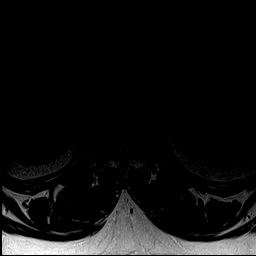

[Series 6: T1 · axial · 4.0mm · 0.35mm/px · z∈[-52,+84]mm · 3 of 35 slices shown (2 of 2)]
[im 6/35]
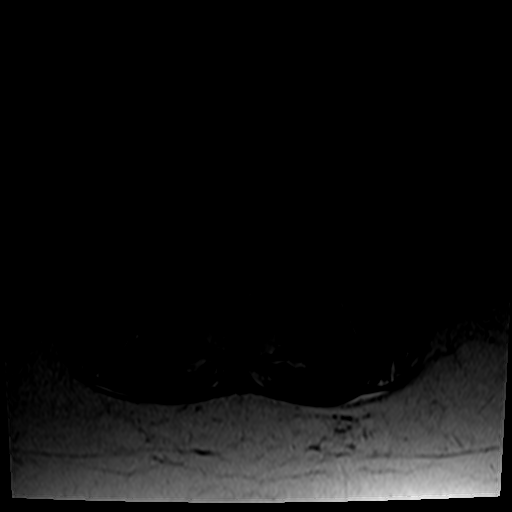
[im 19/35]
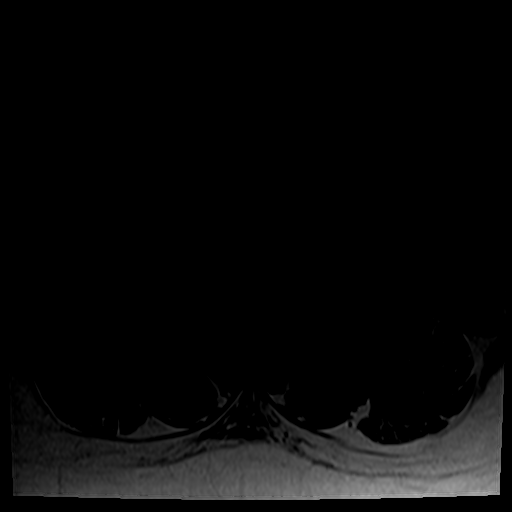
[im 29/35]
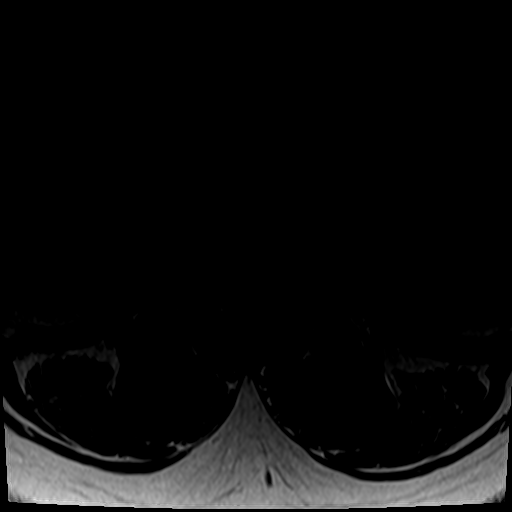

[24 of 48 positions shown; findings below may reference images not displayed]

FINDINGS: Segmentation:  Standard

Alignment:  Normal

Vertebrae:  No fracture, evidence of discitis, or bone lesion.

Conus medullaris and cauda equina: Conus extends to the L1 level.
Conus and cauda equina appear normal.

Paraspinal and other soft tissues: Negative

Disc levels:

The T10-T12 levels are imaged only in the sagittal plane and show no
spinal canal or neural foraminal stenosis.

T12-L1: Normal.

L1-L2: Small central disc protrusion. Narrowing of both lateral
recesses without central spinal canal stenosis. No neural foraminal
stenosis.

L2-L3: Mild facet hypertrophy. Small disc bulge, left asymmetric.
There is no spinal canal stenosis. Mild left neural foraminal
stenosis.

L3-L4: Normal disc space and facet joints. There is no spinal canal
stenosis. No neural foraminal stenosis.

L4-L5: Normal disc space and facet joints. There is no spinal canal
stenosis. No neural foraminal stenosis.

L5-S1: Disc space narrowing with mild bulge. There is no spinal
canal stenosis. Moderate bilateral neural foraminal stenosis.

Visualized sacrum: Normal.
IMPRESSION: 1. Moderate bilateral L5-S1 neural foraminal stenosis.
2. Mild left L2-3 neural foraminal stenosis.

## 2019-12-23 DIAGNOSIS — M47816 Spondylosis without myelopathy or radiculopathy, lumbar region: Secondary | ICD-10-CM | POA: Diagnosis not present

## 2019-12-24 DIAGNOSIS — L700 Acne vulgaris: Secondary | ICD-10-CM | POA: Diagnosis not present

## 2019-12-25 ENCOUNTER — Ambulatory Visit (INDEPENDENT_AMBULATORY_CARE_PROVIDER_SITE_OTHER): Payer: 59 | Admitting: Psychiatry

## 2019-12-25 ENCOUNTER — Ambulatory Visit: Payer: 59 | Admitting: Psychiatry

## 2019-12-25 ENCOUNTER — Encounter: Payer: Self-pay | Admitting: Psychiatry

## 2019-12-25 ENCOUNTER — Other Ambulatory Visit: Payer: Self-pay

## 2019-12-25 DIAGNOSIS — F39 Unspecified mood [affective] disorder: Secondary | ICD-10-CM | POA: Diagnosis not present

## 2019-12-25 DIAGNOSIS — R69 Illness, unspecified: Secondary | ICD-10-CM | POA: Diagnosis not present

## 2019-12-25 DIAGNOSIS — F5101 Primary insomnia: Secondary | ICD-10-CM

## 2019-12-25 DIAGNOSIS — F411 Generalized anxiety disorder: Secondary | ICD-10-CM | POA: Diagnosis not present

## 2019-12-25 MED ORDER — METFORMIN HCL 500 MG PO TABS: ORAL_TABLET | ORAL | 0 refills | Status: DC

## 2019-12-25 MED ORDER — LAMOTRIGINE 150 MG PO TABS: 150.0000 mg | ORAL_TABLET | Freq: Two times a day (BID) | ORAL | 1 refills | Status: DC

## 2019-12-25 MED ORDER — LURASIDONE HCL 80 MG PO TABS: 80.0000 mg | ORAL_TABLET | Freq: Every day | ORAL | 3 refills | Status: DC

## 2019-12-25 MED ORDER — OXCARBAZEPINE 300 MG PO TABS: 300.0000 mg | ORAL_TABLET | Freq: Every day | ORAL | 0 refills | Status: DC

## 2019-12-25 NOTE — Progress Notes (Signed)
Ashley Stephens [id number] 1976/08/24 44 y.o.  Subjective:   Patient ID:  Ashley Stephens is a 44 y.o. (DOB Oct 17, 1975) female.  Chief Complaint:  Chief Complaint  Patient presents with  . Depression  . Anxiety  . Insomnia    HPI Ashley Stephens presents to the office today for follow-up of anxiety, depression, and mood disturbance. She reports that she "had a moment last week... rock bottom." She reports that she had severe depression, anxiety, and suicidality and reached out and called therapist. She reports that her medication seems to be helpful and well tolerated. She reports that there have been multiple stressors. She reports that SI has improved over the last 1-2 weeks. Reports thinking, "it would be easier if I wasn't here." She reports that she took one additional Lamotrigine tab for 3-4 days when mood s/s were severe. Mood is currently "angry, irritable, frustrated, sad." She reports some mood lability and reports that mood changes are not as severe as they have been in the past. She reports that she does better with structure and having loose ends tied up. She reports having some anxiety. Denies panic attacks. She reports that sleep has been disrupted with worsening depression. Goes to bed around 1-2 am and wakes up 1-2 times a night and gets up at 8-9 am. Appetite "comes and goes." Energy and motivation have been lower with worsening depression. Concentration has been impaired and negatively affecting her work. Denies impulsive or risky behavior. Denies current suicidal intent or plan. Contracts for safety.    Past medication trials: Wellbutrin Effexor Pristiq-effective Lamictal-effective for mood Latuda Xanax- Usually takes at night Propanolol- Effective Hydroxyzine Rexulti Gabapentin- prescribed for back pain. Dizziness, headaches.  Lithium Trileptal  AIMS     Office Visit from 11/27/2019 in [facility] Visit from  08/14/2019 in [facility] Visit from 05/29/2019 in [facility] Total Score  0  0  0    GAD-7     Office Visit from 12/25/2019 in [facility]  Total GAD-7 Score  16    PHQ2-9     Office Visit from 12/25/2019 in [facility] Visit from 12/03/2019 in [facility], [facility] Office Visit from 06/26/2019 in [facility], [facility] Office Visit from 10/03/2018 in [facility], [facility] Office Visit from 06/20/2018 in [facility], [facility]  PHQ-2 Total Score  5  0  0  0  2  PHQ-9 Total Score  18  --  --  --  15       Review of Systems:  Review of Systems  Musculoskeletal: Negative for gait problem.       She reports improved pain since having radiofrequency ablation.   Neurological: Negative for tremors.  Psychiatric/Behavioral:       Please refer to HPI   She reports that Vitamin B 12 level was low and has been directed to have monthly Vit B12 injections.   Medications: I have reviewed the patient's current medications.  Current Outpatient Medications  Medication Sig Dispense Refill  . ALPRAZolam (XANAX) 0.25 MG tablet Take 1 tablet (0.25 mg total) by mouth 2 (two) times daily as needed. for anxiety 45 tablet 2  . BIOTIN PO Take by mouth.    . Cyanocobalamin (VITAMIN B 12 PO) Take by mouth.    . Cyanocobalamin 1000 MCG/ML KIT Inject as directed.    . cyclobenzaprine (FLEXERIL) 5 MG tablet     . desvenlafaxine (  PRISTIQ) 100 MG 24 hr tablet Take 1 tablet (100 mg total) by mouth every morning. 90 tablet 1  . desvenlafaxine (PRISTIQ) 50 MG 24 hr tablet Take 1 tablet (50 mg total) by mouth every evening. 90 tablet 1  . hydrOXYzine (VISTARIL) 50 MG capsule Take 1 capsule by mouth three times daily as needed 90 capsule 1  . lamoTRIgine (LAMICTAL) 150 MG tablet Take 1 tablet (150 mg total) by mouth 2 (two) times daily. 180 tablet 1  . levothyroxine (SYNTHROID) 25 MCG tablet Take 1 tablet  (25 mcg total) by mouth daily before breakfast. 90 tablet 2  . Oxcarbazepine (TRILEPTAL) 300 MG tablet Take 1 tablet (300 mg total) by mouth at bedtime. 90 tablet 0  . propranolol (INDERAL) 20 MG tablet Take 1 tablet (20 mg total) by mouth 3 (three) times daily as needed. 270 tablet 1  . Vitamin D, Ergocalciferol, (DRISDOL) 1.25 MG (50000 UT) CAPS capsule Take 1 capsule (50,000 Units total) by mouth every 7 (seven) days. 6 capsule 0  . lurasidone (LATUDA) 80 MG TABS tablet Take 1 tablet (80 mg total) by mouth daily with breakfast. 30 tablet 3  . metFORMIN (GLUCOPHAGE) 500 MG tablet Take 1 tab po qd x 1 week, then increase to 1 po BID as tolerated 180 tablet 0  . omeprazole (PRILOSEC) 40 MG capsule Take 1 capsule (40 mg total) by mouth 2 (two) times daily before a meal. (Patient taking differently: Take 40 mg by mouth 2 (two) times daily as needed. ) 30 capsule 3  . sucralfate (CARAFATE) 1 GM/10ML suspension Take 10 mLs (1 g total) by mouth 4 (four) times daily -  with meals and at bedtime. (Patient not taking: Reported on 12/25/2019) 420 mL 0   No current facility-administered medications for this visit.    Medication Side Effects: None  Allergies: No Known Allergies  Past Medical History:  Diagnosis Date  . Anxiety   . Depression   . Gastric ulcer   . GERD (gastroesophageal reflux disease)   . Hypertension   . Osteoarthritis   . Thyroid disorder     Family History  Problem Relation Age of Onset  . Breast cancer Maternal Grandmother     Social History   Socioeconomic History  . Marital status: Single    Spouse name: Not on file  . Number of children: Not on file  . Years of education: Not on file  . Highest education level: Not on file  Occupational History  . Not on file  Tobacco Use  . Smoking status: Never Smoker  . Smokeless tobacco: Never Used  Substance and Sexual Activity  . Alcohol use: No    Alcohol/week: 0.0 standard drinks  . Drug use: No  . Sexual activity:  Yes    Birth control/protection: Surgical  Other Topics Concern  . Not on file  Social History Narrative  . Not on file   Social Determinants of Health   Financial Resource Strain:   . Difficulty of Paying Living Expenses:   Food Insecurity:   . Worried About Charity fundraiser in the Last Year:   . Arboriculturist in the Last Year:   Transportation Needs:   . Film/video editor (Medical):   Marland Kitchen Lack of Transportation (Non-Medical):   Physical Activity:   . Days of Exercise per Week:   . Minutes of Exercise per Session:   Stress:   . Feeling of Stress :   Social Connections:   .  Frequency of Communication with Friends and Family:   . Frequency of Social Gatherings with Friends and Family:   . Attends Religious Services:   . Active Member of Clubs or Organizations:   . Attends Archivist Meetings:   Marland Kitchen Marital Status:   Intimate Partner Violence:   . Fear of Current or Ex-Partner:   . Emotionally Abused:   Marland Kitchen Physically Abused:   . Sexually Abused:     Past Medical History, Surgical history, Social history, and Family history were reviewed and updated as appropriate.   Please see review of systems for further details on the patient's review from today.   Objective:   Physical Exam:  BP 101/67   Pulse 64   Wt 275 lb (124.7 kg)   BMI 43.07 kg/m   Physical Exam Constitutional:      General: She is not in acute distress. Musculoskeletal:        General: No deformity.  Neurological:     Mental Status: She is alert and oriented to person, place, and time.     Coordination: Coordination normal.  Psychiatric:        Attention and Perception: Attention and perception normal. She does not perceive auditory or visual hallucinations.        Mood and Affect: Mood is anxious and depressed. Affect is not labile, blunt, angry or inappropriate.        Speech: Speech normal.        Behavior: Behavior normal.        Thought Content: Thought content normal. Thought  content is not paranoid or delusional. Thought content does not include homicidal or suicidal ideation. Thought content does not include homicidal or suicidal plan.        Cognition and Memory: Cognition and memory normal.        Judgment: Judgment normal.     Comments: Insight intact     Lab Review:     Component Value Date/Time   NA 139 07/10/2019 0953   NA 142 12/03/2011 1059   K 5.0 07/10/2019 0953   K 3.4 (L) 12/03/2011 1059   CL 101 07/10/2019 0953   CL 107 12/03/2011 1059   CO2 24 07/10/2019 0953   CO2 27 12/03/2011 1059   GLUCOSE 82 07/10/2019 0953   GLUCOSE 105 (H) 02/05/2018 1045   GLUCOSE 66 12/03/2011 1059   BUN 9 07/10/2019 0953   BUN 4 (L) 12/03/2011 1059   CREATININE 1.01 (H) 07/10/2019 0953   CREATININE 0.82 12/03/2011 1059   CALCIUM 9.5 07/10/2019 0953   CALCIUM 8.5 12/03/2011 1059   PROT 6.5 07/10/2019 0953   ALBUMIN 4.2 07/10/2019 0953   AST 17 07/10/2019 0953   ALT 12 07/10/2019 0953   ALKPHOS 80 07/10/2019 0953   BILITOT <0.2 07/10/2019 0953   GFRNONAA 67 07/10/2019 0953   GFRNONAA >60 12/03/2011 1059   GFRAA 78 07/10/2019 0953   GFRAA >60 12/03/2011 1059       Component Value Date/Time   WBC 4.9 07/10/2019 0953   WBC 4.7 02/05/2018 1045   RBC 4.77 07/10/2019 0953   RBC 4.65 02/05/2018 1045   HGB 13.5 07/10/2019 0953   HCT 41.0 07/10/2019 0953   PLT 340 07/10/2019 0953   MCV 86 07/10/2019 0953   MCH 28.3 07/10/2019 0953   MCH 28.8 02/05/2018 1045   MCHC 32.9 07/10/2019 0953   MCHC 33.9 02/05/2018 1045   RDW 14.0 07/10/2019 0953   LYMPHSABS 1.4 02/05/2018 1045   LYMPHSABS  1.8 12/27/2017 0740   MONOABS 0.4 02/05/2018 1045   EOSABS 0.1 02/05/2018 1045   EOSABS 0.1 12/27/2017 0740   BASOSABS 0.0 02/05/2018 1045   BASOSABS 0.0 12/27/2017 0740    No results found for: POCLITH, LITHIUM   No results found for: PHENYTOIN, PHENOBARB, VALPROATE, CBMZ   .res Assessment: Plan:    Patient seen for 30 minutes and time spent counseling  patient regarding possible treatment options.  She reports that she would prefer to continue current medications without any changes at this time since medications have been helpful overall with managing mood and anxiety signs and symptoms, and she would like to continue to work with therapist on strategies to improve mood and anxiety.  Patient expresses some concern with slight elevations in hemoglobin A1c and having weight gain since starting Latuda.  Discussed that studies have shown that Metformin can be helpful for antipsychotic-induced weight gain and metabolic side effects associated with antipsychotics.  Discussed potential benefits, risks, and side effects of metformin and patient agrees to trial.  Advised patient to start metformin 500 mg once daily with a meal for 1 week and then increase to twice daily with a meal as tolerated.  Also provided patient with patient education regarding diabetes and hemoglobin A1c. Patient to follow-up with this provider in 4 weeks or sooner if clinically indicated. Recommend continuing psychotherapy with Merilyn Baba, LCSW. Patient advised to contact office with any questions, adverse effects, or acute worsening in signs and symptoms.   Ashley Stephens was seen today for depression, anxiety and insomnia.  Diagnoses and all orders for this visit:  Mood disorder ([place]) -     lamoTRIgine (LAMICTAL) 150 MG tablet; Take 1 tablet (150 mg total) by mouth 2 (two) times daily. -     lurasidone (LATUDA) 80 MG TABS tablet; Take 1 tablet (80 mg total) by mouth daily with breakfast. -     Oxcarbazepine (TRILEPTAL) 300 MG tablet; Take 1 tablet (300 mg total) by mouth at bedtime.  Generalized anxiety disorder -     Oxcarbazepine (TRILEPTAL) 300 MG tablet; Take 1 tablet (300 mg total) by mouth at bedtime.  Primary insomnia -     Oxcarbazepine (TRILEPTAL) 300 MG tablet; Take 1 tablet (300 mg total) by mouth at bedtime.  Other orders -     metFORMIN (GLUCOPHAGE) 500 MG tablet;  Take 1 tab po qd x 1 week, then increase to 1 po BID as tolerated     Please see After Visit Summary for patient specific instructions.  Future Appointments  Date Time Provider [place]  01/04/2020  8:30 AM NOVA-NURSE N[facility]-N[facility] None  01/08/2020  8:00 AM [name], LCSW CP-CP None  01/22/2020  8:00 AM [name], LCSW CP-CP None  01/22/2020 10:30 AM [name], PMHNP CP-CP None  02/05/2020  8:00 AM [name], LCSW CP-CP None  02/19/2020  8:00 AM [name], LCSW CP-CP None  03/11/2020  8:00 AM [name], LCSW CP-CP None  03/25/2020  8:00 AM [name], LCSW CP-CP None  04/05/2020  8:45 AM Junius Creamer, Greer Ee, NP NOVA-NOVA None    No orders of the defined types were placed in this encounter.   -------------------------------

## 2019-12-25 NOTE — Progress Notes (Signed)
      [facility] Counselor/Therapist Progress Note  Patient ID: Ashley Stephens, MRN: [id number],    Date: 12/25/2019  Time Spent:  60 minutes   8:00am to 9:00am  Treatment Type: Individual Therapy  Reported Symptoms: depression (stronger), anxiety, frustration  Mental Status Exam:  Appearance:   Casual     Behavior:  Appropriate and Sharing  Motor:  Normal  Speech/Language:   Normal Rate  Affect:  depressed, anxious  Mood:  anxious and depressed  Thought process:  normal  Thought content:    WNL  Sensory/Perceptual disturbances:    WNL  Orientation:  oriented to person, place, time/date, situation, day of week, month of year and year  Attention:  Good/Fair  Concentration:  Good/Fair  Memory:  WNL  Fund of knowledge:   Good  Insight:    Good/Fair  Judgment:   Good/Fair  Impulse Control:  Good   Risk Assessment: Danger to Self:  No Self-injurious Behavior: No Danger to Others: No Duty to Warn:no Physical Aggression / Violence:No  Access to Firearms a concern: No  Gang Involvement:No   Subjective:  Patient in today with anxiety and depression, with depression being some stronger due to more family incidents recently.  Focusing on more self-care and boundary setting today.  Interventions: Cognitive Behavioral Therapy and Ego-Supportive  Diagnosis:   ICD-10-CM   1. Mood disorder ([place])  F39      Plan: Patient not signing tx plan on computer screen due to [place].  Treatment Goals: Patient not signing tx plan on computer screen due to [place].  Long term goal: Develop the ability to recognize, accept, and cope with feelings of depression and anxiety.  Short term goal: Learn and implement personal skills for managing stress, solving daily problems, and resolving conflicts effectively.  Strategy: Teach patient calming strategies, problem solving skills, and conflict resolution skills to better manage daily stressors.  Progressing- Patient in today  and initially is more calm than she was feeling last appt.  Shortly into session as she begins to share situations that have happened since last appt, patient became more agitated and emotional.Family relationships continue to be conflictual and patient particularly aggravated with her daughter, mother, and brother. Processed several incidents from this past week and it's obvious family does not respect her and takes advantage of patient.  Patient was able to see how they use and manipulate her, and she is beginning to realize how she allows them to treat her poorly.  "I get a million things on my mind and it takes me a while to wind down, and sleep helps the most. Agrees to work on boundaries although it is very hard within the family. Per goals above, worked more on calming strategies, problem solving skills, and conflict resolution to better manage conflicts and stressors including: slow deliberate deep breathing exercises, meditation, walking, designated personal time alone, contact with people who are healthy for her, using her faith as a support, allowing herself to get more sleep when needed, remaining on her meds, and positive self-talk.  Goal review and progress noted with patient.  Next appt within 2 weeks.   [name], LCSW

## 2020-01-04 ENCOUNTER — Ambulatory Visit: Payer: 59

## 2020-01-08 ENCOUNTER — Other Ambulatory Visit: Payer: Self-pay

## 2020-01-08 ENCOUNTER — Ambulatory Visit (INDEPENDENT_AMBULATORY_CARE_PROVIDER_SITE_OTHER): Payer: 59 | Admitting: Psychiatry

## 2020-01-08 ENCOUNTER — Ambulatory Visit (INDEPENDENT_AMBULATORY_CARE_PROVIDER_SITE_OTHER): Payer: 59

## 2020-01-08 DIAGNOSIS — F39 Unspecified mood [affective] disorder: Secondary | ICD-10-CM

## 2020-01-08 DIAGNOSIS — E538 Deficiency of other specified B group vitamins: Secondary | ICD-10-CM

## 2020-01-08 DIAGNOSIS — R69 Illness, unspecified: Secondary | ICD-10-CM | POA: Diagnosis not present

## 2020-01-08 MED ORDER — CYANOCOBALAMIN 1000 MCG/ML IJ SOLN
1000.0000 ug | Freq: Once | INTRAMUSCULAR | Status: AC
  Administered 2020-01-08: 1000 ug via INTRAMUSCULAR
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
## 2020-01-13 DIAGNOSIS — M47816 Spondylosis without myelopathy or radiculopathy, lumbar region: Secondary | ICD-10-CM | POA: Diagnosis not present

## 2020-01-19 DIAGNOSIS — L700 Acne vulgaris: Secondary | ICD-10-CM | POA: Diagnosis not present

## 2020-01-20 ENCOUNTER — Ambulatory Visit (INDEPENDENT_AMBULATORY_CARE_PROVIDER_SITE_OTHER): Payer: 59 | Admitting: Psychiatry

## 2020-01-20 ENCOUNTER — Other Ambulatory Visit: Payer: Self-pay

## 2020-01-20 ENCOUNTER — Encounter: Payer: Self-pay | Admitting: Psychiatry

## 2020-01-20 ENCOUNTER — Telehealth (HOSPITAL_COMMUNITY): Payer: Self-pay | Admitting: Professional

## 2020-01-20 VITALS — Wt 272.0 lb

## 2020-01-20 DIAGNOSIS — R69 Illness, unspecified: Secondary | ICD-10-CM | POA: Diagnosis not present

## 2020-01-20 DIAGNOSIS — F411 Generalized anxiety disorder: Secondary | ICD-10-CM

## 2020-01-20 DIAGNOSIS — F39 Unspecified mood [affective] disorder: Secondary | ICD-10-CM

## 2020-01-20 DIAGNOSIS — F5101 Primary insomnia: Secondary | ICD-10-CM | POA: Diagnosis not present

## 2020-01-20 NOTE — Progress Notes (Signed)
Ashley Stephens [id number] 03-27-1976 44 y.o.  Subjective:   Patient ID:  Ashley Stephens is a 44 y.o. (DOB 24-Oct-1975) female.  Chief Complaint:  Chief Complaint  Patient presents with  . Depression  . Anxiety  . Insomnia    HPI Ashley Stephens presents to the office today for follow-up of mood disturbance, anxiety, and insomnia. Pt reports, "I think I need to go somewhere... I don't have any fight left in me." She reports that she has very low energy and it is difficult to get out of bed. She reports that at times she will sit in a chair without watching TV or doing anything else. Has been missing work due to depression and reports working "on and off." She reports "the medication helps calm the situation, but it does not take the situation away... it helps control the depression." She reports that she is having very low energy and motivation. Anxiety has been elevated. She reports that she is having panic attacks about once a week. Recently had a panic attack when she was thinking about going to the grocery store. She reports that she has been picking at her skin, to include her face. She reports that she is in the bed most of the time. Reports getting an adequate amount of sleep, "but it's not a restful sleep." She reports that she has been eating excessively at times. Reports that she ordered food on Sunday and that the food did not satisfy her and then ordered more food. She reports poor concentration. Reports anhedonia. She reports that she has thoughts that "it would be better for me if I was not here." She reports occ suicidal intent.   She reports multiple stressors to include work and family. Brother attempted suicide and her mother is wanting pt to be her confidant "but I am not able to do that."   Past medication trials: Wellbutrin Effexor Pristiq-effective Lamictal-effective for mood Latuda Xanax- Usually takes at night Propanolol-  Effective Hydroxyzine Rexulti Gabapentin- prescribed for back pain. Dizziness, headaches.  Lithium Trileptal   AIMS     Office Visit from 11/27/2019 in [place] Visit from 08/14/2019 in [place] Visit from 05/29/2019 in [facility] Total Score  0  0  0    GAD-7     Office Visit from 12/25/2019 in [place]  Total GAD-7 Score  16    PHQ2-9     Office Visit from 12/25/2019 in [place] Visit from 12/03/2019 in [facility] Office Visit from 06/26/2019 [facility] Office Visit from 10/03/2018 in [facility], [facility] Office Visit from 06/20/2018 in [facility], [facility]  PHQ-2 Total Score  5  0  0  0  2  PHQ-9 Total Score  18  -  -  -  15       Review of Systems:  Review of Systems  Musculoskeletal: Negative for gait problem.  Neurological: Negative for tremors.  Psychiatric/Behavioral:       Please refer to HPI    Medications: I have reviewed the patient's current medications.  Current Outpatient Medications  Medication Sig Dispense Refill  . ALPRAZolam (XANAX) 0.25 MG tablet Take 1 tablet (0.25 mg total) by mouth 2 (two) times daily as needed. for anxiety 45 tablet 2  . BIOTIN PO Take by mouth.    . Cyanocobalamin (VITAMIN B 12 PO) Take by mouth.    Marland Kitchen  Cyanocobalamin 1000 MCG/ML KIT Inject as directed.    . cyclobenzaprine (FLEXERIL) 5 MG tablet     . desvenlafaxine (PRISTIQ) 100 MG 24 hr tablet Take 1 tablet (100 mg total) by mouth every morning. 90 tablet 1  . desvenlafaxine (PRISTIQ) 50 MG 24 hr tablet Take 1 tablet (50 mg total) by mouth every evening. 90 tablet 1  . hydrOXYzine (VISTARIL) 50 MG capsule Take 1 capsule by mouth three times daily as needed 90 capsule 1  . lamoTRIgine (LAMICTAL) 150 MG tablet Take 1 tablet (150 mg total) by mouth 2 (two) times daily. 180 tablet 1  . levothyroxine (SYNTHROID)  25 MCG tablet Take 1 tablet (25 mcg total) by mouth daily before breakfast. 90 tablet 2  . lurasidone (LATUDA) 80 MG TABS tablet Take 1 tablet (80 mg total) by mouth daily with breakfast. 30 tablet 3  . metFORMIN (GLUCOPHAGE) 500 MG tablet Take 1 tab po qd x 1 week, then increase to 1 po BID as tolerated 180 tablet 0  . omeprazole (PRILOSEC) 40 MG capsule Take 1 capsule (40 mg total) by mouth 2 (two) times daily before a meal. (Patient taking differently: Take 40 mg by mouth 2 (two) times daily as needed. ) 30 capsule 3  . Oxcarbazepine (TRILEPTAL) 300 MG tablet Take 1 tablet (300 mg total) by mouth at bedtime. 90 tablet 0  . sucralfate (CARAFATE) 1 GM/10ML suspension Take 10 mLs (1 g total) by mouth 4 (four) times daily -  with meals and at bedtime. 420 mL 0  . Vitamin D, Ergocalciferol, (DRISDOL) 1.25 MG (50000 UT) CAPS capsule Take 1 capsule (50,000 Units total) by mouth every 7 (seven) days. 6 capsule 0  . propranolol (INDERAL) 20 MG tablet Take 1 tablet (20 mg total) by mouth 3 (three) times daily as needed. 270 tablet 1   No current facility-administered medications for this visit.    Medication Side Effects: None  Allergies: No Known Allergies  Past Medical History:  Diagnosis Date  . Anxiety   . Depression   . Gastric ulcer   . GERD (gastroesophageal reflux disease)   . Hypertension   . Osteoarthritis   . Thyroid disorder     Family History  Problem Relation Age of Onset  . Breast cancer Maternal Grandmother     Social History   Socioeconomic History  . Marital status: Single    Spouse name: Not on file  . Number of children: Not on file  . Years of education: Not on file  . Highest education level: Not on file  Occupational History  . Not on file  Tobacco Use  . Smoking status: Never Smoker  . Smokeless tobacco: Never Used  Substance and Sexual Activity  . Alcohol use: No    Alcohol/week: 0.0 standard drinks  . Drug use: No  . Sexual activity: Yes    Birth  control/protection: Surgical  Other Topics Concern  . Not on file  Social History Narrative  . Not on file   Social Determinants of Health   Financial Resource Strain:   . Difficulty of Paying Living Expenses:   Food Insecurity:   . Worried About Charity fundraiser in the Last Year:   . Arboriculturist in the Last Year:   Transportation Needs:   . Film/video editor (Medical):   Marland Kitchen Lack of Transportation (Non-Medical):   Physical Activity:   . Days of Exercise per Week:   . Minutes of Exercise per Session:  Stress:   . Feeling of Stress :   Social Connections:   . Frequency of Communication with Friends and Family:   . Frequency of Social Gatherings with Friends and Family:   . Attends Religious Services:   . Active Member of Clubs or Organizations:   . Attends Archivist Meetings:   Marland Kitchen Marital Status:   Intimate Partner Violence:   . Fear of Current or Ex-Partner:   . Emotionally Abused:   Marland Kitchen Physically Abused:   . Sexually Abused:     Past Medical History, Surgical history, Social history, and Family history were reviewed and updated as appropriate.   Please see review of systems for further details on the patient's review from today.   Objective:   Physical Exam:  Wt 272 lb (123.4 kg)   BMI 42.60 kg/m   Physical Exam Constitutional:      General: She is not in acute distress. Musculoskeletal:        General: No deformity.  Neurological:     Mental Status: She is alert and oriented to person, place, and time.     Coordination: Coordination normal.  Psychiatric:        Attention and Perception: Attention and perception normal. She does not perceive auditory or visual hallucinations.        Mood and Affect: Mood is anxious and depressed. Affect is not labile, blunt, angry or inappropriate.        Speech: Speech normal.        Behavior: Behavior is cooperative.        Thought Content: Thought content is not paranoid or delusional. Thought content  includes suicidal ideation. Thought content does not include homicidal ideation. Thought content does not include homicidal or suicidal plan.        Cognition and Memory: Cognition and memory normal.        Judgment: Judgment normal.     Comments: Insight intact Contracts for safety     Lab Review:     Component Value Date/Time   NA 139 07/10/2019 0953   NA 142 12/03/2011 1059   K 5.0 07/10/2019 0953   K 3.4 (L) 12/03/2011 1059   CL 101 07/10/2019 0953   CL 107 12/03/2011 1059   CO2 24 07/10/2019 0953   CO2 27 12/03/2011 1059   GLUCOSE 82 07/10/2019 0953   GLUCOSE 105 (H) 02/05/2018 1045   GLUCOSE 66 12/03/2011 1059   BUN 9 07/10/2019 0953   BUN 4 (L) 12/03/2011 1059   CREATININE 1.01 (H) 07/10/2019 0953   CREATININE 0.82 12/03/2011 1059   CALCIUM 9.5 07/10/2019 0953   CALCIUM 8.5 12/03/2011 1059   PROT 6.5 07/10/2019 0953   ALBUMIN 4.2 07/10/2019 0953   AST 17 07/10/2019 0953   ALT 12 07/10/2019 0953   ALKPHOS 80 07/10/2019 0953   BILITOT <0.2 07/10/2019 0953   GFRNONAA 67 07/10/2019 0953   GFRNONAA >60 12/03/2011 1059   GFRAA 78 07/10/2019 0953   GFRAA >60 12/03/2011 1059       Component Value Date/Time   WBC 4.9 07/10/2019 0953   WBC 4.7 02/05/2018 1045   RBC 4.77 07/10/2019 0953   RBC 4.65 02/05/2018 1045   HGB 13.5 07/10/2019 0953   HCT 41.0 07/10/2019 0953   PLT 340 07/10/2019 0953   MCV 86 07/10/2019 0953   MCH 28.3 07/10/2019 0953   MCH 28.8 02/05/2018 1045   MCHC 32.9 07/10/2019 0953   MCHC 33.9 02/05/2018 1045   RDW 14.0  07/10/2019 0953   LYMPHSABS 1.4 02/05/2018 1045   LYMPHSABS 1.8 12/27/2017 0740   MONOABS 0.4 02/05/2018 1045   EOSABS 0.1 02/05/2018 1045   EOSABS 0.1 12/27/2017 0740   BASOSABS 0.0 02/05/2018 1045   BASOSABS 0.0 12/27/2017 0740    No results found for: POCLITH, LITHIUM   No results found for: PHENYTOIN, PHENOBARB, VALPROATE, CBMZ   .res Assessment: Plan:   45 minutes spent counseling patient, staffing the case with Dr.  Clovis Pu and patient's therapist, [name], LCSW, and referring to [facility] for evaluation.  Discussed different treatment options with patient, to include inpatient hospitalization or partial hospitalization, since patient does not meet criteria for involuntary commitment but is reporting having periods of increased suicidal thoughts.  Discussed PHP program and that this would be an outpatient program that would meet on a daily basis for several hours a day and be able to offer more intensive treatment.  Patient expresses interest in [facility] and PHP program contacted by provider. PHP staff talked with pt and assessed her over the phone and has scheduled apt for 01/27/20 to evaluate pt. Pt contracts for safety and reports that she has been given the number for PHP staff and also the crisis line and will call these numbers or go to the ER if there are safety concerns.  Recommend leave of absence from work with tentative return to work date of 02/15/20.    Ashley Stephens was seen today for depression, anxiety and insomnia.  Diagnoses and all orders for this visit:  Mood disorder ([place])  Generalized anxiety disorder  Primary insomnia     Please see After Visit Summary for patient specific instructions.  Future Appointments  Date Time Provider [place]  01/27/2020  2:00 PM [facility] PHP CLINIC [facility] None  02/05/2020  8:00 AM [name], LCSW [facility] None  02/19/2020  8:00 AM [name], LCSW CP-[facility] None  03/11/2020  8:00 AM [name], LCSW CP-CP None  03/25/2020  8:00 AM [name], LCSW CP-[facility] None  04/05/2020  8:45 AM Junius Creamer, Greer Ee, NP [facility] None    No orders of the defined types were placed in this encounter.   -------------------------------

## 2020-01-22 ENCOUNTER — Ambulatory Visit: Payer: 59 | Admitting: Psychiatry

## 2020-01-25 ENCOUNTER — Telehealth: Payer: Self-pay

## 2020-01-25 DIAGNOSIS — Z0289 Encounter for other administrative examinations: Secondary | ICD-10-CM

## 2020-01-25 NOTE — Telephone Encounter (Signed)
Short Term Disability Paperwork completed with effective dates 01/20/2020-02/15/2020.  Forms placed in [employer] box for review and signature.

## 2020-01-26 NOTE — Telephone Encounter (Signed)
Short Term Disability paperwork has been faxed to [employer].

## 2020-01-26 NOTE — Telephone Encounter (Signed)
noted 

## 2020-01-27 ENCOUNTER — Other Ambulatory Visit (HOSPITAL_COMMUNITY): Payer: 59 | Attending: Psychiatry | Admitting: Licensed Clinical Social Worker

## 2020-01-27 ENCOUNTER — Telehealth (HOSPITAL_COMMUNITY): Payer: Self-pay | Admitting: Professional

## 2020-01-27 DIAGNOSIS — F411 Generalized anxiety disorder: Secondary | ICD-10-CM | POA: Insufficient documentation

## 2020-01-27 DIAGNOSIS — R69 Illness, unspecified: Secondary | ICD-10-CM | POA: Diagnosis not present

## 2020-01-27 DIAGNOSIS — F332 Major depressive disorder, recurrent severe without psychotic features: Secondary | ICD-10-CM | POA: Insufficient documentation

## 2020-01-28 ENCOUNTER — Other Ambulatory Visit: Payer: Self-pay

## 2020-01-28 ENCOUNTER — Other Ambulatory Visit (HOSPITAL_COMMUNITY): Payer: 59

## 2020-01-29 ENCOUNTER — Other Ambulatory Visit (HOSPITAL_COMMUNITY): Payer: 59

## 2020-01-29 ENCOUNTER — Other Ambulatory Visit: Payer: Self-pay

## 2020-02-02 ENCOUNTER — Other Ambulatory Visit (HOSPITAL_COMMUNITY): Payer: 59 | Attending: Psychiatry | Admitting: Occupational Therapy

## 2020-02-02 ENCOUNTER — Other Ambulatory Visit: Payer: Self-pay

## 2020-02-02 ENCOUNTER — Encounter (HOSPITAL_COMMUNITY): Payer: Self-pay

## 2020-02-02 ENCOUNTER — Other Ambulatory Visit (HOSPITAL_COMMUNITY): Payer: 59 | Admitting: Licensed Clinical Social Worker

## 2020-02-02 DIAGNOSIS — F411 Generalized anxiety disorder: Secondary | ICD-10-CM | POA: Diagnosis not present

## 2020-02-02 DIAGNOSIS — Z7984 Long term (current) use of oral hypoglycemic drugs: Secondary | ICD-10-CM | POA: Diagnosis not present

## 2020-02-02 DIAGNOSIS — I10 Essential (primary) hypertension: Secondary | ICD-10-CM | POA: Diagnosis not present

## 2020-02-02 DIAGNOSIS — E079 Disorder of thyroid, unspecified: Secondary | ICD-10-CM | POA: Diagnosis not present

## 2020-02-02 DIAGNOSIS — M1990 Unspecified osteoarthritis, unspecified site: Secondary | ICD-10-CM | POA: Insufficient documentation

## 2020-02-02 DIAGNOSIS — F4320 Adjustment disorder, unspecified: Secondary | ICD-10-CM | POA: Diagnosis not present

## 2020-02-02 DIAGNOSIS — K219 Gastro-esophageal reflux disease without esophagitis: Secondary | ICD-10-CM | POA: Insufficient documentation

## 2020-02-02 DIAGNOSIS — Z658 Other specified problems related to psychosocial circumstances: Secondary | ICD-10-CM | POA: Insufficient documentation

## 2020-02-02 DIAGNOSIS — Z9884 Bariatric surgery status: Secondary | ICD-10-CM | POA: Diagnosis not present

## 2020-02-02 DIAGNOSIS — F332 Major depressive disorder, recurrent severe without psychotic features: Secondary | ICD-10-CM | POA: Insufficient documentation

## 2020-02-02 DIAGNOSIS — R69 Illness, unspecified: Secondary | ICD-10-CM | POA: Diagnosis not present

## 2020-02-02 DIAGNOSIS — Z634 Disappearance and death of family member: Secondary | ICD-10-CM | POA: Insufficient documentation

## 2020-02-02 DIAGNOSIS — R41844 Frontal lobe and executive function deficit: Secondary | ICD-10-CM

## 2020-02-02 DIAGNOSIS — Z79890 Hormone replacement therapy (postmenopausal): Secondary | ICD-10-CM | POA: Diagnosis not present

## 2020-02-02 DIAGNOSIS — Z79899 Other long term (current) drug therapy: Secondary | ICD-10-CM | POA: Diagnosis not present

## 2020-02-02 DIAGNOSIS — R4589 Other symptoms and signs involving emotional state: Secondary | ICD-10-CM

## 2020-02-02 NOTE — Psych (Signed)
Virtual Visit via Video Note  I connected with Ashley Stephens on 02/02/20 at  9:00 AM EDT by a video enabled telemedicine application and verified that I am speaking with the correct person using two identifiers.   I discussed the limitations of evaluation and management by telemedicine and the availability of in person appointments. The patient expressed understanding and agreed to proceed.   Location:  Patient: Patient Home Provider: Clinical Home Office   Follow Up Instructions:    I discussed the assessment and treatment plan with the patient. The patient was provided an opportunity to ask questions and all were answered. The patient agreed with the plan and demonstrated an understanding of the instructions.   The patient was advised to call back or seek an in-person evaluation if the symptoms worsen or if the condition fails to improve as anticipated.  I provided 10 minutes of non-face-to-face time during this encounter.  Patient verbally agrees to treatment plan.   [name], [facility]
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
## 2020-02-02 NOTE — Therapy (Signed)
[place], [place], [address] Phone: [phone]   Fax:  [phone] Virtual Visit via Video Note  I connected with Ashley Stephens on 02/02/20 at  11:00 AM EDT by a video enabled telemedicine application and verified that I am speaking with the correct person using two identifiers.   I discussed the limitations of evaluation and management by telemedicine and the availability of in person appointments. The patient expressed understanding and agreed to proceed.   I discussed the assessment and treatment plan with the patient. The patient was provided an opportunity to ask questions and all were answered. The patient agreed with the plan and demonstrated an understanding of the instructions.   The patient was advised to call back or seek an in-person evaluation if the symptoms worsen or if the condition fails to improve as anticipated.  I provided 90 minutes of non-face-to-face time during this encounter.   Occupational Therapy Evaluation  Patient Details  Name: Ashley Stephens MRN: [id number] Date of Birth: February 16, 1976 Referring Provider (OT): Ricky Ala   Encounter Date: 02/02/2020  OT End of Session - 02/02/20 1514    Visit Number  1    Number of Visits  20    Date for OT Re-Evaluation  03/01/20    Authorization Type  no insurance on file    OT Start Time  1105    OT Stop Time  1205   1-130 OT Eval   OT Time Calculation (min)  60 min    Activity Tolerance  Patient tolerated treatment well    Behavior During Therapy  [facility] for tasks assessed/performed       Past Medical History:  Diagnosis Date  . Anxiety   . Depression   . Gastric ulcer   . GERD (gastroesophageal reflux disease)   . Hypertension   . Osteoarthritis   . Thyroid disorder     Past Surgical History:  Procedure Laterality Date  . ABDOMINAL HYSTERECTOMY  2013  . BARIATRIC SURGERY    . BREAST BIOPSY  1995  .  GASTRIC BYPASS  2012  . TUBAL LIGATION  1996    There were no vitals filed for this visit.  Subjective Assessment - 02/02/20 1512    Currently in Pain?  No/denies    Multiple Pain Sites  No        [facility] OT Assessment - 02/02/20 0001      Assessment   Medical Diagnosis  Major Depressive Disorder    Referring Provider (OT)  Ricky Ala      Precautions   Precautions  None      Restrictions   Weight Bearing Restrictions  No      Balance Screen   Has the patient fallen in the past 6 months  No    Has the patient had a decrease in activity level because of a fear of falling?   No    Is the patient reluctant to leave their home because of a fear of falling?   No          OT Education - 02/02/20 1512    Education Details  Educated on PHP OT programming and sleep hygiene and strategies to improve overall sleep quality    Person(s) Educated  Patient    Methods  Explanation;Handout    Comprehension  Verbalized understanding       OT Short Term Goals - 02/02/20 1519      OT  SHORT TERM GOAL #1   Title  Pt will actively engage in OT group sessions throughout duration of PHP programming, in order to promote daily structure, social engagement, and opportunities to develop and utilize adaptive strategies to maximize functional performance in preparation for safe transition and integration back into school, work, and the community.    Time  4    Period  Weeks    Status  New    Target Date  03/01/20      OT SHORT TERM GOAL #2   Title  Pt will practice and identify 1-3 adaptive coping strategies she can utilize, in order to safely manage increased depression/anxiety, with min cues, in preparation for safe and healthy reintegration back into the community at discharge.    Status  New      OT SHORT TERM GOAL #3   Title  Pt will demonstrate improved ability to communicate feelings/needs/wants, as evidenced by, active participation in OT sessions, throughout duration of PHP  programming, in order to safely transition back into the community at discharge.    Status  New      OT SHORT TERM GOAL #4   Title  Pt will identify 1-3 sleep hygiene strategies she can utilize, in order to improve sleep quality/ADL performance, in preparation for safe and healthy reintegration back into the community at discharge.    Status  New     Group Session:  S: "Sometimes I spend a whole 24 hours in this room. From sleeping to eating to working."  O: Today's group discussion focused on topic of Sleep Hygiene. Patients reflected on the quality of sleep they typically receive and identified areas that need improvement. Group was given background information on sleep and sleep hygiene, including common sleep disorders. Group members also received information on how to improve one's sleep and introduced a sleep diary as a tool that can be utilized to track sleep quality over a length of time. Group session ended with patients identifying one or more strategies they could utilize or implement into their sleep routine in order to improve overall sleep quality.    A: Ashley Stephens was active in her participation of discussion and shared that she often fluctuates in her sleep patterns, between sleeping too much and sleeping too little. Pt shared that she works from home and can spend up to 24 hours in her bedroom between sleeping and working, though did identify that she uses a desk to get her work done. Pt appeared receptive to education and suggestions provided by OT to improve sleep hygiene including taking a break and changing the scenery, by getting out of the bedroom. Pt was also encouraged to rest on the couch during her break, versus her bed, as she knows she will not fall asleep. Pt identified "establishing boundaries" and setting up a nighttime routine as two sleep hygiene practices and strategies she would like to put into practice.    P: Continue to attend PHP OT group sessions 5x week for 4  weeks to promote daily structure, social engagement, and opportunities to develop and utilize adaptive strategies to maximize functional performance in preparation for safe transition and integration back into school, work, and the community. Plan to address topic of communication styles in next OT group session.  OT Assessment:  Diagnosis: Major depressive disorder, anxiety Past medical history/referral information: See above Living situation: Pt lives at home with her 71 y/o adult daughter ADLs: Pt identifies difficulty with ADL and iADL performance including making meals,  sleep, establishing a routine, and managing finances.  Work: Pt works full-time from home from [employer] in [employer]. Pt identifies strong dissatisfaction with current position and hours, though noted need to retain job in order to pay bills and manage finances.  Leisure: Pt denies any current hobbies though notes interest in shopping, travelling, and eating food (trying new restaurants).  Social support: Pt identifies her 32 y/o daughter as her biggest support though notes, "not telling her everything" as to "not be a burden" Struggles: Pt is currently struggling with a number of psychosocial stressors including job [employer] fundraiser, managing finances, and family issues OT goals: See above  [place] Summary of Client Scores:  Facilitates participation in occupation Allows participation in occupation Inhibits participation in occupation Restricts participation in occupation Comments:  Roles   X  Mom, coworker, friend  Habits   X  Identifies dissatisfaction with current routine  [place]   X  Identifies being most proud of ability to forgive people  Values   X  Relationship with God  Interests   X  Shopping, traveling, eating food/trying new restaurants  Skills  X     Short-Term Goals   X  Sets goals however gets side-tracked and does not return to them   Long-term Goals    X    Interpretation of Past Experiences   X  Identifies past experiences as "typical" though unable to identify best period of her life.  Identified worst periods: having her children, childhood trauma/hardships  Physical Environment  X     Social Environment   X  Reports desire to be alone 100% of the time Seeks support from 47 y/o daughter  Readiness for Change   X      Need for Occupational Therapy:  4 Shows positive occupational participation, no need for OT.   3 Need for minimal intervention/consultative participation  X 2 Need for OT intervention indicated to restore/improve participation   1 Need for extensive OT intervention indicated to improve participation.  Referral for follow up services also recommended.   Assessment:  Patient demonstrates behavior that inhibits participation in occupation. Pt will benefit from skilled occupational therapy services in order to address current difficulties with emotion regulation, socialization, stress management, time management, job readiness, financial wellness, health and nutrition, sleep hygiene, and leisure participation, in preparation for reintegration and return to community at discharge.   Plan:  Patient will participate in skilled occupational therapy sessions (group or individual) to promote daily structure, social engagement, and opportunities to develop and utilize adaptive strategies to maximize functional performance in preparation for safe transition and integration back into school, work, and/or the community. OT sessions with occur 4-5 x per week for 3-4 weeks.    Ponciano Ort, MOT, OTR/L     Plan - 02/02/20 1515    Clinical Impression Statement  Pt is a 28 female with PMHx of major depression and anxiety who was referred to [facility] and self identified a number of psychosocial stressors including work and its related stressors, family and relationship issues, and not having time for herself.    OT Occupational Profile and  History  Problem Focused Assessment - Including review of records relating to presenting problem    Occupational performance deficits (Please refer to evaluation for details):  ADL's;IADL's;Rest and Sleep;Work;Leisure;Social Participation    Body Structure / Function / Physical Skills  ADL    Cognitive Skills  Attention;Emotional;Energy/Drive;Problem Solve;Temperament/Personality    Psychosocial Skills  Coping Strategies;Interpersonal  Interaction;Routines and Behaviors;Habits    Rehab Potential  Good    Clinical Decision Making  Limited treatment options, no task modification necessary    Comorbidities Affecting Occupational Performance:  May have comorbidities impacting occupational performance    Modification or Assistance to Complete Evaluation   No modification of tasks or assist necessary to complete eval    OT Frequency  5x / week    OT Duration  4 weeks    OT Treatment/Interventions  Self-care/ADL training;Psychosocial skills training;Coping strategies training    Consulted and Agree with Plan of Care  Patient       Patient will benefit from skilled therapeutic intervention in order to improve the following deficits and impairments:   Body Structure / Function / Physical Skills: ADL Cognitive Skills: Attention, Emotional, Energy/Drive, Problem Solve, Temperament/Personality Psychosocial Skills: Coping Strategies, Interpersonal Interaction, Routines and Behaviors, Habits   Visit Diagnosis: Difficulty coping  Frontal lobe and executive function deficit  Severe episode of recurrent major depressive disorder, without psychotic features ([place])    Problem List Patient Active Problem List   Diagnosis Date Noted  . Major depressive disorder, recurrent episode, severe ([place]) 01/27/2020  . DDD (degenerative disc disease), lumbar 12/16/2019  . B12 deficiency 12/16/2019  . Encounter for general adult medical examination with abnormal findings 07/12/2019  . Borderline diabetes  07/12/2019  . Gastroesophageal reflux disease without esophagitis 07/12/2019  . Encounter for breast cancer screening other than mammogram 07/12/2019  . Dysuria 07/12/2019  . Mild mood disorder ([place]) 06/07/2018  . Generalized anxiety disorder 06/07/2018  . Type 2 diabetes mellitus with hyperglycemia, without long-term current use of insulin ([place]) 12/14/2017  . Impaired fasting glucose 12/14/2017  . Fatigue 12/14/2017  . Abnormal weight gain 12/14/2017  . Vitamin D deficiency 12/14/2017  . Acquired hypothyroidism 12/14/2017   Ponciano Ort, MOT, OTR/L  02/02/2020, 3:23 PM  [facility] [address], [place], [address] Phone: [phone]   Fax:  [phone]  Name: Ashley Stephens MRN: [id number] Date of Birth: [birth year]

## 2020-02-03 ENCOUNTER — Other Ambulatory Visit (HOSPITAL_COMMUNITY): Payer: 59 | Admitting: Occupational Therapy

## 2020-02-03 ENCOUNTER — Other Ambulatory Visit (HOSPITAL_COMMUNITY): Payer: 59 | Admitting: Licensed Clinical Social Worker

## 2020-02-03 ENCOUNTER — Encounter (HOSPITAL_COMMUNITY): Payer: Self-pay

## 2020-02-03 ENCOUNTER — Other Ambulatory Visit: Payer: Self-pay

## 2020-02-03 DIAGNOSIS — R41844 Frontal lobe and executive function deficit: Secondary | ICD-10-CM

## 2020-02-03 DIAGNOSIS — F332 Major depressive disorder, recurrent severe without psychotic features: Secondary | ICD-10-CM

## 2020-02-03 DIAGNOSIS — F39 Unspecified mood [affective] disorder: Secondary | ICD-10-CM

## 2020-02-03 DIAGNOSIS — F411 Generalized anxiety disorder: Secondary | ICD-10-CM

## 2020-02-03 DIAGNOSIS — R4589 Other symptoms and signs involving emotional state: Secondary | ICD-10-CM

## 2020-02-03 NOTE — Therapy (Signed)
[place], [place], [address] Phone: [phone]   Fax:  [phone] Virtual Visit via Video Note  I connected with [name] on 02/03/20 at  10:00 AM EDT by a video enabled telemedicine application and verified that I am speaking with the correct person using two identifiers.   I discussed the limitations of evaluation and management by telemedicine and the availability of in person appointments. The patient expressed understanding and agreed to proceed.   I discussed the assessment and treatment plan with the patient. The patient was provided an opportunity to ask questions and all were answered. The patient agreed with the plan and demonstrated an understanding of the instructions.   The patient was advised to call back or seek an in-person evaluation if the symptoms worsen or if the condition fails to improve as anticipated.  I provided 60 minutes of non-face-to-face time during this encounter.    Occupational Therapy Treatment  Patient Details  Name: Ashley Stephens MRN: [id number] Date of Birth: 02/11/1976 Referring Provider (OT): Ricky Ala   Encounter Date: 02/03/2020  OT End of Session - 02/03/20 1439    Visit Number  2    Number of Visits  20    Date for OT Re-Evaluation  03/01/20    Authorization Type  no insurance on file    OT Start Time  1000    OT Stop Time  1100    OT Time Calculation (min)  60 min    Activity Tolerance  Patient tolerated treatment well    Behavior During Therapy  [facility] for tasks assessed/performed       Past Medical History:  Diagnosis Date  . Anxiety   . Depression   . Gastric ulcer   . GERD (gastroesophageal reflux disease)   . Hypertension   . Osteoarthritis   . Thyroid disorder     Past Surgical History:  Procedure Laterality Date  . ABDOMINAL HYSTERECTOMY  2013  . BARIATRIC SURGERY    . BREAST BIOPSY  1995  . GASTRIC BYPASS   2012  . TUBAL LIGATION  1996    There were no vitals filed for this visit.  Subjective Assessment - 02/03/20 1439    Currently in Pain?  No/denies      Group Session:  S: "I know that I am aggressive in my communication. I can start out appropriate but it gets to a point on, how do you say 'leave me the h*ll alone' politely?"   O: Group began with a reflection from previous OT session on sleep hygiene. Group members shared their reflections and progress on whether or not they implemented a new sleep hygiene tip or strategy into their routine. Today's group focused on topic of Communication Styles. Group members were educated on the different styles including passive, aggressive, and assertive communication. Members shared and reflected on which style they most often find themselves communicating in and how to transition to a more assertive approach. Benefits and drawbacks of each communication style were discussed and use of the XYZ assertive communication tool was introduced. The XYZ communication tool states: I feel X when you do Y in situation Z and I would like _________. X is the emotion, Y is the specific behavior, and Z is the specific situation. Group members each formulated their own XYZ statement and shared with the group to discuss and offer feedback.    A: Kalleigh was active and independent in her  participation of discussion. Pt reflected that she did not implement a new sleep hygiene strategy, however expressed desire to do so in the coming week. Pt identified most with the aggressive communication style and shared that she would like to learn strategies to be more assertive and practice the XYZ statements. Identified her XYZ statement as "I feel frustrated when you get all up under me all the time and I would like you to stop and leave me the h*ll alone." Pt was able to independently recognize that this was not an appropriate XYZ statement, noting need to be more polite and recognize  that this person might not do this "all the time." Appeared receptive to education and feedback received from both OT and other group members. Identified need to continue working on XYZ statements and expressed desire to do so in next session.  P: Continue to attend PHP OT group sessions 5x week for 4 weeks to promote daily structure, social engagement, and opportunities to develop and utilize adaptive strategies to maximize functional performance in preparation for safe transition and integration back into school, work, and the community. Plan to address topic of assertive communication and active listening in next OT group session.   OT Education - 02/03/20 1439    Education Details  Educated on communication styles and use of XYZ assertive communication tool    Person(s) Educated  Patient    Methods  Explanation    Comprehension  Verbalized understanding       OT Short Term Goals - 02/03/20 1440      OT SHORT TERM GOAL #1   Status  On-going      OT SHORT TERM GOAL #2   Status  On-going      OT SHORT TERM GOAL #3   Status  On-going      OT SHORT TERM GOAL #4   Status  On-going               Plan - 02/03/20 1439    Occupational performance deficits (Please refer to evaluation for details):  ADL's;IADL's;Rest and Sleep;Work;Leisure;Social Participation    Body Structure / Function / Physical Skills  ADL    Cognitive Skills  Attention;Emotional;Energy/Drive;Problem Solve;Temperament/Personality    Psychosocial Skills  Coping Strategies;Interpersonal Interaction;Routines and Behaviors;Habits       Patient will benefit from skilled therapeutic intervention in order to improve the following deficits and impairments:   Body Structure / Function / Physical Skills: ADL Cognitive Skills: Attention, Emotional, Energy/Drive, Problem Solve, Temperament/Personality Psychosocial Skills: Coping Strategies, Interpersonal Interaction, Routines and Behaviors, Habits   Visit  Diagnosis: Difficulty coping  Frontal lobe and executive function deficit  Severe episode of recurrent major depressive disorder, without psychotic features [facility])    Problem List Patient Active Problem List   Diagnosis Date Noted  . Major depressive disorder, recurrent episode, severe ([place]) 01/27/2020  . DDD (degenerative disc disease), lumbar 12/16/2019  . B12 deficiency 12/16/2019  . Encounter for general adult medical examination with abnormal findings 07/12/2019  . Borderline diabetes 07/12/2019  . Gastroesophageal reflux disease without esophagitis 07/12/2019  . Encounter for breast cancer screening other than mammogram 07/12/2019  . Dysuria 07/12/2019  . Mild mood disorder ([place]) 06/07/2018  . Generalized anxiety disorder 06/07/2018  . Type 2 diabetes mellitus with hyperglycemia, without long-term current use of insulin ([place]) 12/14/2017  . Impaired fasting glucose 12/14/2017  . Fatigue 12/14/2017  . Abnormal weight gain 12/14/2017  . Vitamin D deficiency 12/14/2017  . Acquired hypothyroidism 12/14/2017  Ponciano Ort, MOT, OTR/L  02/03/2020, 2:40 PM  [place], [place], [address] Phone: [phone]   Fax:  [phone]  Name: Khushboo Nega MRN: [id number] Date of Birth: Nov 10, 1975

## 2020-02-03 NOTE — Progress Notes (Signed)
Virtual Visit via Video Note  I connected with Ashley Stephens on 02/03/20 at  9:00 AM EDT by a video enabled telemedicine application and verified that I am speaking with the correct person using two identifiers.   I discussed the limitations of evaluation and management by telemedicine and the availability of in person appointments. The patient expressed understanding and agreed to proceed.   I discussed the assessment and treatment plan with the patient. The patient was provided an opportunity to ask questions and all were answered. The patient agreed with the plan and demonstrated an understanding of the instructions.   The patient was advised to call back or seek an in-person evaluation if the symptoms worsen or if the condition fails to improve as anticipated.  I provided 15 minutes of non-face-to-face time during this encounter.   Derrill Center, NP    Behavioral Health Partial Program Assessment Note  Date: 02/03/2020 Name: Ashley Stephens MRN: [id number]   HPI: Ashley Stephens is a 44 y.o. African American female presents with worsening depression.  Patient reports multiple job stressors states she is employed by [employer] and more recently her job has become demanding.  Alyse Low reports she continues to struggle with grief and loss due to the passing of her aunties and she reported that she had a brother that committed suicide.  reports she is followed by therapist and psychiatrist at [facility].  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  Reported she was recently initiated on Latuda, Lamictal Xanax and propanolol.  Currently she is denying suicidal or homicidal ideations.  Denies auditory or visual hallucinations. Patient reported that her nxiety and depression hasn't been controled with medication or therapy, as she has trying multiple medication in the past .  Patient reports she is prescribed patient was enrolled in partial psychiatric  program on 02/03/20.  Primary complaints include: anxiety, depression lessened, feeling depressed and feeling suicidal.  Onset of symptoms was abrupt with gradually worsening course since that time. Psychosocial Stressors include the following: family.  Denied previous inpatient admissions.  Denies history of physical or sexual abuse in the past.    I have reviewed the following documentation dated : past psychiatric history, past social and family history and past Review of systems  Complaints of Pain: nonear Past Psychiatric History:  Past psychiatric hospitalizations , Past medication trials and Therapy, Out Patient  Currently in treatment with Lutuda, Pristiq, Vistaril and Lamictal   Substance Abuse History: none Use of Alcohol: occasional, social use  Use of Caffeine: denies use Use of over the counter:   Past Surgical History:  Procedure Laterality Date  . ABDOMINAL HYSTERECTOMY  2013  . BARIATRIC SURGERY    . BREAST BIOPSY  1995  . GASTRIC BYPASS  2012  . TUBAL LIGATION  1996    Past Medical History:  Diagnosis Date  . Anxiety   . Depression   . Gastric ulcer   . GERD (gastroesophageal reflux disease)   . Hypertension   . Osteoarthritis   . Thyroid disorder    Outpatient Encounter Medications as of 02/03/2020  Medication Sig  . ALPRAZolam (XANAX) 0.25 MG tablet Take 1 tablet (0.25 mg total) by mouth 2 (two) times daily as needed. for anxiety  . BIOTIN PO Take by mouth.  . Cyanocobalamin (VITAMIN B 12 PO) Take by mouth.  . Cyanocobalamin 1000 MCG/ML KIT Inject as directed.  . cyclobenzaprine (FLEXERIL) 5 MG tablet   . desvenlafaxine (PRISTIQ) 100 MG 24 hr tablet Take 1  tablet (100 mg total) by mouth every morning.  . desvenlafaxine (PRISTIQ) 50 MG 24 hr tablet Take 1 tablet (50 mg total) by mouth every evening.  . hydrOXYzine (VISTARIL) 50 MG capsule Take 1 capsule by mouth three times daily as needed  . lamoTRIgine (LAMICTAL) 150 MG tablet Take 1 tablet (150 mg  total) by mouth 2 (two) times daily.  Marland Kitchen levothyroxine (SYNTHROID) 25 MCG tablet Take 1 tablet (25 mcg total) by mouth daily before breakfast.  . lurasidone (LATUDA) 80 MG TABS tablet Take 1 tablet (80 mg total) by mouth daily with breakfast.  . metFORMIN (GLUCOPHAGE) 500 MG tablet Take 1 tab po qd x 1 week, then increase to 1 po BID as tolerated  . omeprazole (PRILOSEC) 40 MG capsule Take 1 capsule (40 mg total) by mouth 2 (two) times daily before a meal. (Patient taking differently: Take 40 mg by mouth 2 (two) times daily as needed. )  . Oxcarbazepine (TRILEPTAL) 300 MG tablet Take 1 tablet (300 mg total) by mouth at bedtime.  . propranolol (INDERAL) 20 MG tablet Take 1 tablet (20 mg total) by mouth 3 (three) times daily as needed.  . sucralfate (CARAFATE) 1 GM/10ML suspension Take 10 mLs (1 g total) by mouth 4 (four) times daily -  with meals and at bedtime.  . Vitamin D, Ergocalciferol, (DRISDOL) 1.25 MG (50000 UT) CAPS capsule Take 1 capsule (50,000 Units total) by mouth every 7 (seven) days.   No facility-administered encounter medications on file as of 02/03/2020.   No Known Allergies  Social History   Tobacco Use  . Smoking status: Never Smoker  . Smokeless tobacco: Never Used  Substance Use Topics  . Alcohol use: No    Alcohol/week: 0.0 standard drinks   Functioning Relationships: good support system Education: College       Please specify degree: some Other Pertinent History: None Family History  Problem Relation Age of Onset  . Breast cancer Maternal Grandmother      Review of Systems Constitutional: negative  Objective:  There were no vitals filed for this visit.  Physical Exam:   Mental Status Exam: Appearance:  Well groomed Psychomotor::  Within Normal Limits Attention span and concentration: Normal Behavior: calm and cooperative Speech:  normal volume Mood:  depressed and anxious Affect:  normal and mood-congruent Thought Process:  Coherent Thought  Content:  Logical Orientation:  person, place and time/date Cognition:  grossly intact Insight:  Intact Judgment:  Intact Estimate of Intelligence: Average Fund of knowledge: Aware of current events Memory: Recent and remote intact Abnormal movements: None Gait and station: Normal  Assessment:  Diagnosis: No primary diagnosis found. No diagnosis found.  Indications for admission: inpatient care required if not in partial hospital program  Plan: Orders placed for Occupational Therapy  patient enrolled in Partial Hospitalization Program, patient's current medications are to be continued, a comprehensive treatment plan will be developed and side effects of medications have been reviewed with patient  Treatment options and alternatives reviewed with patient and patient understands the above plan.Treatment plan was reviwed and agreed upon by NP. T.Charleen Madera and patient Eren Puebla need for group services.    Derrill Center, NP

## 2020-02-04 ENCOUNTER — Other Ambulatory Visit (HOSPITAL_COMMUNITY): Payer: 59 | Admitting: Licensed Clinical Social Worker

## 2020-02-04 ENCOUNTER — Encounter (HOSPITAL_COMMUNITY): Payer: Self-pay

## 2020-02-04 ENCOUNTER — Other Ambulatory Visit: Payer: Self-pay

## 2020-02-04 ENCOUNTER — Other Ambulatory Visit (HOSPITAL_COMMUNITY): Payer: 59 | Admitting: Occupational Therapy

## 2020-02-04 DIAGNOSIS — R4589 Other symptoms and signs involving emotional state: Secondary | ICD-10-CM

## 2020-02-04 DIAGNOSIS — F411 Generalized anxiety disorder: Secondary | ICD-10-CM

## 2020-02-04 DIAGNOSIS — F332 Major depressive disorder, recurrent severe without psychotic features: Secondary | ICD-10-CM

## 2020-02-04 DIAGNOSIS — R41844 Frontal lobe and executive function deficit: Secondary | ICD-10-CM

## 2020-02-04 NOTE — Progress Notes (Signed)
Spiritual care group   02/03/2020  11:00-12:00  Group met via web-ex due to COVID-19 precautions.  Group facilitated by Simone Curia, MDiv, BCC   Group focused on topic of "self-care"  Patients engaged in facilitated discussion about topic.  Explored quotes related to self care and chose one which they agreed with and one which they disliked.  Engaged in discussion around quote choices and their experience / understanding of care for themselves.

## 2020-02-04 NOTE — Progress Notes (Signed)
Spoke with patient via Webex video call, used 2 identifiers to correctly identify patient. This is her first time in [place], which was recommended by her psychiatrist [name] at [facility]. She has multiple stressors such as a lot of family drama, 2 of her close aunts passed away, work stress, her brother recently being suicidal and everyday stress such as financial. She is needing support and looking forward to groups. On scale 1-10 as 10 being worst she rates depression at 5 and anxiety at 5. Denies SI/HI or AV hallucinations. No side effects from medications. No issues or complaints.

## 2020-02-04 NOTE — Therapy (Signed)
[facility], [place], [address] Phone: [phone]   Fax:  [phone] Virtual Visit via Video Note  I connected with Ashley Stephens on 02/04/20 at  11:00 AM EDT by a video enabled telemedicine application and verified that I am speaking with the correct person using two identifiers.   I discussed the limitations of evaluation and management by telemedicine and the availability of in person appointments. The patient expressed understanding and agreed to proceed.   I discussed the assessment and treatment plan with the patient. The patient was provided an opportunity to ask questions and all were answered. The patient agreed with the plan and demonstrated an understanding of the instructions.   The patient was advised to call back or seek an in-person evaluation if the symptoms worsen or if the condition fails to improve as anticipated.  I provided 60 minutes of non-face-to-face time during this encounter.   Occupational Therapy Treatment  Patient Details  Name: Ashley Stephens MRN: [id number] Date of Birth: Jun 12, 1976 Referring Provider (OT): Ricky Ala   Encounter Date: 02/04/2020  OT End of Session - 02/04/20 [id number]    Visit Number  3    Number of Visits  20    Date for OT Re-Evaluation  03/01/20    Authorization Type  no insurance on file    OT Start Time  1100    OT Stop Time  1200    OT Time Calculation (min)  60 min    Activity Tolerance  Patient tolerated treatment well    Behavior During Therapy  [facility] for tasks assessed/performed       Past Medical History:  Diagnosis Date  . Anxiety   . Depression   . Gastric ulcer   . GERD (gastroesophageal reflux disease)   . Hypertension   . Osteoarthritis   . Thyroid disorder     Past Surgical History:  Procedure Laterality Date  . ABDOMINAL HYSTERECTOMY  2013  . BARIATRIC SURGERY    . BREAST BIOPSY  1995  . GASTRIC BYPASS   2012  . TUBAL LIGATION  1996    There were no vitals filed for this visit.  Subjective Assessment - 02/04/20 1358    Currently in Pain?  No/denies         OT Education - 02/04/20 1358    Education Details  Educated on assertive communication    Person(s) Educated  Patient    Methods  Explanation;Handout    Comprehension  Verbalized understanding       OT Short Term Goals - 02/03/20 1440      OT SHORT TERM GOAL #1   Status  On-going      OT SHORT TERM GOAL #2   Status  On-going      OT SHORT TERM GOAL #3   Status  On-going      OT SHORT TERM GOAL #4   Status  On-going     Group Session:  S: "I am aggressive, that is just who I am as a person. I don't know how to change that."  O: Group began with a reflection from previous OT session on communication styles. Group members shared their take aways and reflections and identified if they practiced their assertiveness skills. Today's group continued with a discussion focused on communication, specifically discussing assertive communication with a focus on identifying specific strategies and tips one can utilize to become more assertive. Group members were given various  scenarios and role-played passive, assertive, and aggressive responses. Group members then took those same scenarios and practiced identifying an assertive XYZ statement they could utilize. Members shared their own personal life scenarios and worked collaboratively to create an XYZ they could use in a real life situation.    A: Ashley Stephens was active in her participation of discussion and appeared both motivated and focused on content; observed taking notes and asking relevant questions. Pt identified that it is difficult for her to distinguish between being assertive and aggressive and identifies getting stuck most in an aggressive style of communication. Pt identified a personal scenario that she has difficulty being assertive in which a member of her household stands  around her while she is on the phone and this is bothersome to her. Pt identified XYZ statement: "I feel angry when you try to talk to me while I am on the phone. I would like you to be more respectful and just stop." Pt offered feedback from OT around request identified, suggesting patient be more specific in her request - instead of saying "be more respectful" ask for what you want - "I want you to give me space and leave the room while I am on the phone." Appeared receptive to feedback offered.   P: Continue to attend PHP OT group sessions 5x week for 4 weeks to promote daily structure, social engagement, and opportunities to develop and utilize adaptive strategies to maximize functional performance in preparation for safe transition and integration back into school, work, and the community. Plan to address topic of non-verbal communication in next OT group session.     Plan - 02/04/20 1359    Occupational performance deficits (Please refer to evaluation for details):  ADL's;IADL's;Rest and Sleep;Work;Leisure;Social Participation    Body Structure / Function / Physical Skills  ADL    Cognitive Skills  Attention;Emotional;Energy/Drive;Problem Solve;Temperament/Personality    Psychosocial Skills  Coping Strategies;Interpersonal Interaction;Routines and Behaviors;Habits       Patient will benefit from skilled therapeutic intervention in order to improve the following deficits and impairments:   Body Structure / Function / Physical Skills: ADL Cognitive Skills: Attention, Emotional, Energy/Drive, Problem Solve, Temperament/Personality Psychosocial Skills: Coping Strategies, Interpersonal Interaction, Routines and Behaviors, Habits   Visit Diagnosis: Difficulty coping  Frontal lobe and executive function deficit  Severe episode of recurrent major depressive disorder, without psychotic features [facility])    Problem List Patient Active Problem List   Diagnosis Date Noted  . Major depressive  disorder, recurrent episode, severe ([place]) 01/27/2020  . DDD (degenerative disc disease), lumbar 12/16/2019  . B12 deficiency 12/16/2019  . Encounter for general adult medical examination with abnormal findings 07/12/2019  . Borderline diabetes 07/12/2019  . Gastroesophageal reflux disease without esophagitis 07/12/2019  . Encounter for breast cancer screening other than mammogram 07/12/2019  . Dysuria 07/12/2019  . Mild mood disorder ([place]) 06/07/2018  . Generalized anxiety disorder 06/07/2018  . Type 2 diabetes mellitus with hyperglycemia, without long-term current use of insulin ([place]) 12/14/2017  . Impaired fasting glucose 12/14/2017  . Fatigue 12/14/2017  . Abnormal weight gain 12/14/2017  . Vitamin D deficiency 12/14/2017  . Acquired hypothyroidism 12/14/2017    Ponciano Ort, MOT, OTR/L  02/04/2020, 2:00 PM  [facility] HOSPITALIZATION PROGRAM [place], [place], [address] Phone: [phone]   Fax:  [phone]  Name: Ashley Stephens MRN: [id number] Date of Birth: May 27, 1976

## 2020-02-05 ENCOUNTER — Other Ambulatory Visit (HOSPITAL_COMMUNITY): Payer: 59

## 2020-02-05 ENCOUNTER — Other Ambulatory Visit: Payer: Self-pay

## 2020-02-05 ENCOUNTER — Ambulatory Visit: Payer: 59 | Admitting: Psychiatry

## 2020-02-05 ENCOUNTER — Telehealth: Payer: Self-pay | Admitting: Psychiatry

## 2020-02-05 DIAGNOSIS — M47816 Spondylosis without myelopathy or radiculopathy, lumbar region: Secondary | ICD-10-CM | POA: Diagnosis not present

## 2020-02-05 NOTE — Telephone Encounter (Signed)
Received fax from [employer] regarding pt's request to extend return to work date. [employer] requesting information regarding last clinical exam findings and confirmation of participation in therapy or IOP program. Contacted pt and reviewed documentation in EMR. Pt has been attending Partial Hospitalization program.   Response faxed to [employer] with contact information for partial Hospitalization program.

## 2020-02-07 ENCOUNTER — Other Ambulatory Visit (HOSPITAL_COMMUNITY): Payer: Self-pay | Admitting: Family

## 2020-02-07 ENCOUNTER — Encounter (HOSPITAL_COMMUNITY): Payer: Self-pay | Admitting: Family

## 2020-02-08 ENCOUNTER — Encounter (HOSPITAL_COMMUNITY): Payer: Self-pay

## 2020-02-08 ENCOUNTER — Other Ambulatory Visit: Payer: Self-pay

## 2020-02-08 ENCOUNTER — Other Ambulatory Visit (HOSPITAL_COMMUNITY): Payer: 59 | Admitting: Occupational Therapy

## 2020-02-08 ENCOUNTER — Other Ambulatory Visit (HOSPITAL_COMMUNITY): Payer: 59 | Admitting: Licensed Clinical Social Worker

## 2020-02-08 DIAGNOSIS — R41844 Frontal lobe and executive function deficit: Secondary | ICD-10-CM

## 2020-02-08 DIAGNOSIS — F332 Major depressive disorder, recurrent severe without psychotic features: Secondary | ICD-10-CM

## 2020-02-08 DIAGNOSIS — R4589 Other symptoms and signs involving emotional state: Secondary | ICD-10-CM

## 2020-02-08 DIAGNOSIS — F411 Generalized anxiety disorder: Secondary | ICD-10-CM

## 2020-02-08 NOTE — Therapy (Signed)
Ashley Stephens, [place], [address] Phone: [phone]   Fax:  [phone] Virtual Visit via Video Note  I connected with [name] on 02/08/20 at  12:00 PM EDT by a video enabled telemedicine application and verified that I am speaking with the correct person using two identifiers.   I discussed the limitations of evaluation and management by telemedicine and the availability of in person appointments. The patient expressed understanding and agreed to proceed.   I discussed the assessment and treatment plan with the patient. The patient was provided an opportunity to ask questions and all were answered. The patient agreed with the plan and demonstrated an understanding of the instructions.   The patient was advised to call back or seek an in-person evaluation if the symptoms worsen or if the condition fails to improve as anticipated.  I provided 50 minutes of non-face-to-face time during this encounter.   Occupational Therapy Treatment  Patient Details  Name: Ashley Stephens MRN: [id number] Date of Birth: 21-Aug-1976 Referring Provider (OT): Ricky Ala   Encounter Date: 02/08/2020  OT End of Session - 02/08/20 1357    Visit Number  4    Number of Visits  20    Date for OT Re-Evaluation  03/01/20    Authorization Type  [employer]    Authorization - Number of Visits  60    OT Start Time  1200    OT Stop Time  1250    OT Time Calculation (min)  50 min    Activity Tolerance  Patient tolerated treatment well    Behavior During Therapy  [facility] for tasks assessed/performed       Past Medical History:  Diagnosis Date  . Anxiety   . Depression   . Gastric ulcer   . GERD (gastroesophageal reflux disease)   . Hypertension   . Osteoarthritis   . Thyroid disorder     Past Surgical History:  Procedure Laterality Date  . ABDOMINAL HYSTERECTOMY  2013  . BARIATRIC SURGERY    . BREAST BIOPSY   1995  . GASTRIC BYPASS  2012  . TUBAL LIGATION  1996    There were no vitals filed for this visit.  Subjective Assessment - 02/08/20 1356    Currently in Pain?  No/denies        OT Education - 02/08/20 1356    Education Details  Educated on tips and strategies to improve overall self-care    Person(s) Educated  Patient    Methods  Explanation;Handout    Comprehension  Verbalized understanding       OT Short Term Goals - 02/03/20 1440      OT SHORT TERM GOAL #1   Status  On-going      OT SHORT TERM GOAL #2   Status  On-going      OT SHORT TERM GOAL #3   Status  On-going      OT SHORT TERM GOAL #4   Status  On-going     Group Session:  S: "I need motivation. If I have that, everything else comes natural and easy. But if I don't, it takes a lot of effort."  O: Group began with a reflection from last week's OT sessions focused on assertive communication skills. Group members shared their weekend events and identified one conversation or situation in which they practiced being assertive and shared the outcome. Today's group session focused on topic of Self-Care. Group began  with members completing a self-care assessment that identified a specific category within self-care that they needed the most improvement in, including physical, emotional/psychological, social, spiritual, and professional self-care. Discussion focused on members sharing which areas they need the most work in and brainstormed strategies and tips on improving one's self-care with the thought that what someone identifies as their "best self" changes day-to-day contingent upon mood, emotions, and situations. Members identified both one "big" and one "small" self-care activity they can and would like to engage in the near future.   A: Jackee was active in her participation of group discussion. Pt shared that she lacks in her self-care when she does not have the motivation, noting that if there is no motivation, it  will not get done. She noted that opposingly, if she does have the motivation, everything else comes easily and naturally. She identified need to improve self-care in the following three categories: physical, emotional, and spiritual. Pt identified connection of physical and emotional self-care, noting again, that she needs the motivation. Pt shared "getting my hair done" as a small self-care activity that she enjoys and identified bigger self-care goal of "travelling to [place]".    P: Continue to attend PHP OT group sessions 5x week for 2 weeks to promote daily structure, social engagement, and opportunities to develop and utilize adaptive strategies to maximize functional performance in preparation for safe transition and integration back into school, work, and the community. Plan to address topic of work/life balance in next OT group session.    Plan - 02/08/20 1358    Occupational performance deficits (Please refer to evaluation for details):  ADL's;IADL's;Rest and Sleep;Work;Leisure;Social Participation    Body Structure / Function / Physical Skills  ADL    Cognitive Skills  Attention;Emotional;Energy/Drive;Problem Solve;Temperament/Personality    Psychosocial Skills  Coping Strategies;Interpersonal Interaction;Routines and Behaviors;Habits       Patient will benefit from skilled therapeutic intervention in order to improve the following deficits and impairments:   Body Structure / Function / Physical Skills: ADL Cognitive Skills: Attention, Emotional, Energy/Drive, Problem Solve, Temperament/Personality Psychosocial Skills: Coping Strategies, Interpersonal Interaction, Routines and Behaviors, Habits   Visit Diagnosis: Difficulty coping  Frontal lobe and executive function deficit  Severe episode of recurrent major depressive disorder, without psychotic features [facility])    Problem List Patient Active Problem List   Diagnosis Date Noted  . Major depressive disorder, recurrent  episode, severe ([place]) 01/27/2020  . DDD (degenerative disc disease), lumbar 12/16/2019  . B12 deficiency 12/16/2019  . Encounter for general adult medical examination with abnormal findings 07/12/2019  . Borderline diabetes 07/12/2019  . Gastroesophageal reflux disease without esophagitis 07/12/2019  . Encounter for breast cancer screening other than mammogram 07/12/2019  . Dysuria 07/12/2019  . Mild mood disorder ([place]) 06/07/2018  . Generalized anxiety disorder 06/07/2018  . Type 2 diabetes mellitus with hyperglycemia, without long-term current use of insulin ([place]) 12/14/2017  . Impaired fasting glucose 12/14/2017  . Fatigue 12/14/2017  . Abnormal weight gain 12/14/2017  . Vitamin D deficiency 12/14/2017  . Acquired hypothyroidism 12/14/2017    Ponciano Ort, MOT, OTR/L 02/08/2020, 1:58 PM  [facility] [place], [place], [address] Phone: [phone]   Fax:  [phone]  Name: Ellice Boultinghouse MRN: [id number] Date of Birth: 1976/03/14

## 2020-02-08 NOTE — Psych (Signed)
Virtual Visit via Video Note  I connected with Ashley Stephens on 02/03/20 at  9:00 AM EDT by a video enabled telemedicine application and verified that I am speaking with the correct person using two identifiers.   I discussed the limitations of evaluation and management by telemedicine and the availability of in person appointments. The patient expressed understanding and agreed to proceed.  Location: Patient: Patient home Provider: Clinical Home office   Follow Up Instructions:    I discussed the assessment and treatment plan with the patient. The patient was provided an opportunity to ask questions and all were answered. The patient agreed with the plan and demonstrated an understanding of the instructions.   The patient was advised to call back or seek an in-person evaluation if the symptoms worsen or if the condition fails to improve as anticipated.  I provided 240 minutes of non-face-to-face time during this encounter.   Ashley Stephens, [facility]   [facility] PHP THERAPIST PROGRESS NOTE  [name] [id number]  Session Time: 9-1  Participation Level: Active  Behavioral Response: CasualAlertDepressed  Type of Therapy: Group Therapy  Treatment Goals addressed: Coping  Interventions: CBT, DBT, Solution Focused, Strength-based, Supportive and Reframing  Summary: Clinician led check-in regarding current stressors and situation, and review of patient completed daily inventory. Clinician utilized active listening and empathetic response and validated patient emotions. Clinician facilitated processing group on pertinent issues.    Therapist Response:  Ashley Stephens is a 44 y.o. female who presents with depression and anxiety symptoms. Patient arrived within time allowed and reports that she is feeling "not as frustrated or nervous as yesterday." Patient rates her mood at a 6 on a scale of 1-10 with 10 being great. Pt reports her she had a stressful  afternoon due to rushing to a doctor's appointment. Pt reports she is frustrated because she learned about a family cookout over the weekend in which she was not invited. Pt reports she is also struggling to get used to her "new normal" of not working and focusing on self.  Pt is able to process. Patient engaged in discussion.      Session Time: 10:00- 11:00   Participation Level: Active   Behavioral Response: CasualAlertDepressed   Type of Therapy: Group Therapy, OT   Treatment Goals addressed: Coping   Interventions: Psychosocial skills training, Supportive   Summary: Occupational Therapy group   Therapist Response: Patient engaged in group. See OT note.      Session Time: 11:00 -12:00   Participation Level: Active   Behavioral Response: CasualAlertDepressed   Type of Therapy: Group Therapy, psychotherapy   Treatment Goals addressed: Coping   Interventions: Strengths based, reframing, Supportive,    Summary:  Spiritual Care group   Therapist Response: Patient engaged in group. See chaplain note.             Session Time: 12:00 -1:00   Participation Level: Active   Behavioral Response: CasualAlertDepressed   Type of Therapy: Group therapy   Treatment Goals addressed: Coping   Interventions: CBT; Solution focused; Supportive; Reframing   Summary: 12:00 - 12:50: Cln introduced grounding and skills that can be used to ground self. 12:50 -1:00 Clinician led check-out. Clinician assessed for immediate needs, medication compliance and efficacy, and safety concerns   Therapist Response: 12:00 - 12:50: Pt engaged in activity and reports they can utilize 5-4-3-2-1 skill.  12:50 - 1:00: At check-out, patient rates her mood at an 8 on a scale of 1-10 with  10 being great. Pt states afternoon plans of running errands and cleaning. Patient demonstrates some progress as evidenced by decreased anxiety related to group. Patient denies SI/HI/self-harm at the end of  group.  Suicidal/Homicidal: Nowithout intent/plan   Plan: Pt will continue in PHP while working to decrease depression and anxiety symptoms, increase ability to manage symptoms in a healthy manner.  Diagnosis: Mood disorder ([place]) [F39]    1. Mood disorder ([place])   2. Generalized anxiety disorder   3. Difficulty coping   4. Severe episode of recurrent major depressive disorder, without psychotic features [facility])       Ashley Stephens, [facility] 02/03/2020

## 2020-02-08 NOTE — Progress Notes (Signed)
Spoke with patient via Webex video call, used 2 identifiers to correctly identify patient. States she had a bad Sunday dealing with issues with friends/family. She takes responsibility for other peoples bad behavior towards her. She also was feeling judged in group today and this upset her. She was crying and stating that she doesn't have any fight left in her to confront people and their negative behaviors. On scale 1-10 as 10 being worst she rates depression at 8 and anxiety 8. Denies SI/HI or AV hallucinations. No side effects from medication. No other complaints.

## 2020-02-09 ENCOUNTER — Other Ambulatory Visit (HOSPITAL_COMMUNITY): Payer: 59 | Admitting: Licensed Clinical Social Worker

## 2020-02-09 ENCOUNTER — Encounter (HOSPITAL_COMMUNITY): Payer: Self-pay | Admitting: Family

## 2020-02-09 ENCOUNTER — Other Ambulatory Visit (HOSPITAL_COMMUNITY): Payer: 59 | Admitting: Occupational Therapy

## 2020-02-09 ENCOUNTER — Encounter (HOSPITAL_COMMUNITY): Payer: Self-pay

## 2020-02-09 ENCOUNTER — Other Ambulatory Visit: Payer: Self-pay

## 2020-02-09 DIAGNOSIS — F411 Generalized anxiety disorder: Secondary | ICD-10-CM

## 2020-02-09 DIAGNOSIS — F332 Major depressive disorder, recurrent severe without psychotic features: Secondary | ICD-10-CM

## 2020-02-09 DIAGNOSIS — R4589 Other symptoms and signs involving emotional state: Secondary | ICD-10-CM

## 2020-02-09 DIAGNOSIS — R41844 Frontal lobe and executive function deficit: Secondary | ICD-10-CM

## 2020-02-09 DIAGNOSIS — F39 Unspecified mood [affective] disorder: Secondary | ICD-10-CM

## 2020-02-09 NOTE — Progress Notes (Signed)
Virtual Visit via Video Note  I connected with Ashley Stephens on 02/09/20 at  9:00 AM EDT by a video enabled telemedicine application and verified that I am speaking with the correct person using two identifiers.   I discussed the limitations of evaluation and management by telemedicine and the availability of in person appointments. The patient expressed understanding and agreed to proceed.   I discussed the assessment and treatment plan with the patient. The patient was provided an opportunity to ask questions and all were answered. The patient agreed with the plan and demonstrated an understanding of the instructions.   The patient was advised to Stephens back or seek an in-person evaluation if the symptoms worsen or if the condition fails to improve as anticipated.  I provided 15 minutes of non-face-to-face time during this encounter.   Derrill Center, NP   BH MD/PA/NP OP Progress Note  02/09/2020 10:17 AM Ashley Stephens  MRN:  [id number]   Evaluation: Ashley Stephens was seen and evaluated via WebEx.  She is awake, alert and oriented x3.  Presents pleasant throughout this assessment.  Denying suicidal or homicidal ideations.  Denies auditory or visual hallucinations.  Patient reports learning coping skills and feels that her anger has been better under control since attending partial hospitalization programming.  Reports she is recently set boundaries with her family members which she states has helped her mood.  Reports a good appetite.  States she is resting well throughout the night.  Patient is rating her depression and anxiety 5 out of 10 with 10 being the worst.  Patient to continue medications as directed.  Support, encouragement and reassurance was provided.    Visit Diagnosis: No diagnosis found.  Past Psychiatric History:   Past Medical History:  Past Medical History:  Diagnosis Date   Anxiety    Depression    Gastric ulcer    GERD (gastroesophageal reflux  disease)    Hypertension    Osteoarthritis    Thyroid disorder     Past Surgical History:  Procedure Laterality Date   ABDOMINAL HYSTERECTOMY  2013   [place]   GASTRIC BYPASS  2012   TUBAL LIGATION  1996    Family Psychiatric History:  Family History:  Family History  Problem Relation Age of Onset   Breast cancer Maternal Grandmother     Social History:  Social History   Socioeconomic History   Marital status: Single    Spouse name: Not on file   Number of children: Not on file   Years of education: Not on file   Highest education level: Not on file  Occupational History   Not on file  Tobacco Use   Smoking status: Never Smoker   Smokeless tobacco: Never Used  Substance and Sexual Activity   Alcohol use: No    Alcohol/week: 0.0 standard drinks   Drug use: No   Sexual activity: Yes    Birth control/protection: Surgical  Other Topics Concern   Not on file  Social History Narrative   Not on file   Social Determinants of Health   Financial Resource Strain:    Difficulty of Paying Living Expenses:   Food Insecurity:    Worried About Charity fundraiser in the Last Year:    Arboriculturist in the Last Year:   Transportation Needs:    Lack of Transportation (Medical):    Lack of Transportation (Non-Medical):   Physical Activity:  Days of Exercise per Week:    Minutes of Exercise per Session:   Stress:    Feeling of Stress :   Social Connections:    Frequency of Communication with Friends and Family:    Frequency of Social Gatherings with Friends and Family:    Attends Religious Services:    Active Member of Clubs or Organizations:    Attends Archivist Meetings:    Marital Status:     Allergies: No Known Allergies  Metabolic Disorder Labs: Lab Results  Component Value Date   HGBA1C 6.3 (A) 12/03/2019   No results found for: PROLACTIN Lab Results  Component Value  Date   CHOL 203 (H) 07/10/2019   TRIG 88 07/10/2019   HDL 43 07/10/2019   CHOLHDL 4.1 12/27/2017   LDLCALC 144 (H) 07/10/2019   LDLCALC 138 (H) 12/27/2017   Lab Results  Component Value Date   TSH 1.770 07/10/2019   TSH 1.880 06/06/2018    Therapeutic Level Labs: No results found for: LITHIUM No results found for: VALPROATE No components found for:  CBMZ  Current Medications: Current Outpatient Medications  Medication Sig Dispense Refill   ALPRAZolam (XANAX) 0.25 MG tablet Take 1 tablet (0.25 mg total) by mouth 2 (two) times daily as needed. for anxiety 45 tablet 2   BIOTIN PO Take by mouth.     Cyanocobalamin (VITAMIN B 12 PO) Take by mouth.     Cyanocobalamin 1000 MCG/ML KIT Inject as directed.     cyclobenzaprine (FLEXERIL) 5 MG tablet      desvenlafaxine (PRISTIQ) 100 MG 24 hr tablet Take 1 tablet (100 mg total) by mouth every morning. 90 tablet 1   desvenlafaxine (PRISTIQ) 50 MG 24 hr tablet Take 1 tablet (50 mg total) by mouth every evening. 90 tablet 1   hydrOXYzine (VISTARIL) 50 MG capsule Take 1 capsule by mouth three times daily as needed 90 capsule 1   lamoTRIgine (LAMICTAL) 150 MG tablet Take 1 tablet (150 mg total) by mouth 2 (two) times daily. 180 tablet 1   levothyroxine (SYNTHROID) 25 MCG tablet Take 1 tablet (25 mcg total) by mouth daily before breakfast. 90 tablet 2   lurasidone (LATUDA) 80 MG TABS tablet Take 1 tablet (80 mg total) by mouth daily with breakfast. 30 tablet 3   metFORMIN (GLUCOPHAGE) 500 MG tablet Take 1 tab po qd x 1 week, then increase to 1 po BID as tolerated 180 tablet 0   omeprazole (PRILOSEC) 40 MG capsule Take 1 capsule (40 mg total) by mouth 2 (two) times daily before a meal. (Patient taking differently: Take 40 mg by mouth 2 (two) times daily as needed. ) 30 capsule 3   Oxcarbazepine (TRILEPTAL) 300 MG tablet Take 1 tablet (300 mg total) by mouth at bedtime. 90 tablet 0   propranolol (INDERAL) 20 MG tablet Take 1 tablet (20  mg total) by mouth 3 (three) times daily as needed. 270 tablet 1   sucralfate (CARAFATE) 1 GM/10ML suspension Take 10 mLs (1 g total) by mouth 4 (four) times daily -  with meals and at bedtime. (Patient not taking: Reported on 02/04/2020) 420 mL 0   Vitamin D, Ergocalciferol, (DRISDOL) 1.25 MG (50000 UT) CAPS capsule Take 1 capsule (50,000 Units total) by mouth every 7 (seven) days. 6 capsule 0   No current facility-administered medications for this visit.     Musculoskeletal:   Psychiatric Specialty Exam: Review of Systems  There were no vitals taken for this visit.There is  no height or weight on file to calculate BMI.  General Appearance: Casual  Eye Contact:  Good  Speech:  Clear and Coherent  Volume:  Normal  Mood:  Anxious and Depressed  Affect:  Congruent  Thought Process:  Coherent  Orientation:  Full (Time, Place, and Person)  Thought Content: Hallucinations: None   Suicidal Thoughts:  No  Homicidal Thoughts:  No  Memory:  Immediate;   Fair Remote;   Fair  Judgement:  Fair  Insight:  Fair  Psychomotor Activity:  Normal  Concentration:  Concentration: Fair  Recall:  [employer] of Knowledge: Fair  Language: Fair  Akathisia:  No  Handed:  Right  AIMS (if indicated):  Assets:  Communication Skills Desire for Improvement Resilience Social Support  ADL's:  Intact  Cognition: WNL  Sleep:  Fair   Screenings: PHQ2-9     Counselor from 02/04/2020 in [place] Office Visit from 12/03/2019 in [facility] Visit from 06/26/2019 in [facility], [facility] Office Visit from 10/03/2018 in [facility], [facility] Office Visit from 06/20/2018 in [facility], [facility]  PHQ-2 Total Score  2  0  0  0  2  PHQ-9 Total Score  13  --  --  --  15       Assessment and Plan:  Patient to continue partial hospitalization programming Continue medications as directed  Treatment plan was reviewed and  agreed upon by NP Myrle Sheng and patient Ashley Stephens need for continued group services   Derrill Center, NP 02/09/2020, 10:17 AM

## 2020-02-09 NOTE — Therapy (Signed)
[facility] [place], [place], [address] Phone: [phone]   Fax:  [phone] Virtual Visit via Video Note  I connected with Ashley Stephens on 02/09/20 at  11:00 AM EDT by a video enabled telemedicine application and verified that I am speaking with the correct person using two identifiers.   I discussed the limitations of evaluation and management by telemedicine and the availability of in person appointments. The patient expressed understanding and agreed to proceed.   I discussed the assessment and treatment plan with the patient. The patient was provided an opportunity to ask questions and all were answered. The patient agreed with the plan and demonstrated an understanding of the instructions.   The patient was advised to call back or seek an in-person evaluation if the symptoms worsen or if the condition fails to improve as anticipated.  I provided 60 minutes of non-face-to-face time during this encounter.    Occupational Therapy Treatment  Patient Details  Name: Ashley Stephens MRN: [id number] Date of Birth: Nov 20, 1975 Referring Provider (OT): Ricky Ala   Encounter Date: 02/09/2020  OT End of Session - 02/09/20 1324    Visit Number  5    Number of Visits  20    Date for OT Re-Evaluation  03/01/20    Authorization Type  [employer]    Authorization - Number of Visits  59    OT Start Time  1105    OT Stop Time  1205    OT Time Calculation (min)  60 min    Activity Tolerance  Patient tolerated treatment well    Behavior During Therapy  WFL for tasks assessed/performed       Past Medical History:  Diagnosis Date  . Anxiety   . Depression   . Gastric ulcer   . GERD (gastroesophageal reflux disease)   . Hypertension   . Osteoarthritis   . Thyroid disorder     Past Surgical History:  Procedure Laterality Date  . ABDOMINAL HYSTERECTOMY  2013  . BARIATRIC SURGERY    . BREAST  BIOPSY  1995  . GASTRIC BYPASS  2012  . TUBAL LIGATION  1996    There were no vitals filed for this visit.  Subjective Assessment - 02/09/20 1324    Currently in Pain?  No/denies            OT Education - 02/09/20 1324    Education Details  Educated on strategies to improve self-care and address work/life balance    Person(s) Educated  Patient    Methods  Explanation;Handout    Comprehension  Verbalized understanding       OT Short Term Goals - 02/03/20 1440      OT SHORT TERM GOAL #1   Status  On-going      OT SHORT TERM GOAL #2   Status  On-going      OT SHORT TERM GOAL #3   Status  On-going      OT SHORT TERM GOAL #4   Status  On-going     Group Session:  S: "I cannot stand working from home. The lines are blurred and there is no work-life balance for me."  O: Group began with a review from previous OT session focused on self-care and members shared one small self-care activity they were able to engage in yesterday afternoon. Today's group session continued discussion on self-care, along with addressing work-life balance. Group began with overview on self-care tips,  including setting specific self-care goals and carving out time in one's schedule to engage in meaningful self-care or leisure interests. Discussion continued with a transition to address work-life balance, with patients sharing where they felt they needed more improvement - working too much or holding life's responsibilities at a higher stake? Tips on how to balance work and life were discussed.   A: [name] was active in her participation of discussion and shared that she was able to take a 45 minute nap as her self-care activity yesterday afternoon. Pt shared that she typically takes two hour long naps, however felt more refreshed afterwards and noted that it would not take away from her sleeping time at night. [name] shared that she does not have any work-life balance, and identified difficulty with  transition to working from home since the start of the [place] pandemic. Pt noted that she eats, sleeps, drinks, and works all in the confined space of her bedroom and this often 'blurs' the line of work and life balance. Pt appeared receptive to the idea of taking breaks and changing environment, suggesting that in the future she might be able to work in the office for half the day and finish at home, meeting her need to get out of the bedroom.   P: Continue to attend PHP OT group sessions 5x week for 2 weeks to promote daily structure, social engagement, and opportunities to develop and utilize adaptive strategies to maximize functional performance in preparation for safe transition and integration back into school, work, and the community. Plan to address topic of physical wellness and exercise in next OT group session.     Plan - 02/09/20 1325    Occupational performance deficits (Please refer to evaluation for details):  ADL's;IADL's;Rest and Sleep;Work;Leisure;Social Participation    Body Structure / Function / Physical Skills  ADL    Cognitive Skills  Attention;Emotional;Energy/Drive;Problem Solve;Temperament/Personality    Psychosocial Skills  Coping Strategies;Interpersonal Interaction;Routines and Behaviors;Habits       Patient will benefit from skilled therapeutic intervention in order to improve the following deficits and impairments:   Body Structure / Function / Physical Skills: ADL Cognitive Skills: Attention, Emotional, Energy/Drive, Problem Solve, Temperament/Personality Psychosocial Skills: Coping Strategies, Interpersonal Interaction, Routines and Behaviors, Habits   Visit Diagnosis: Difficulty coping  Frontal lobe and executive function deficit  Severe episode of recurrent major depressive disorder, without psychotic features [facility])    Problem List Patient Active Problem List   Diagnosis Date Noted  . Major depressive disorder, recurrent episode, severe ([place])  01/27/2020  . DDD (degenerative disc disease), lumbar 12/16/2019  . B12 deficiency 12/16/2019  . Encounter for general adult medical examination with abnormal findings 07/12/2019  . Borderline diabetes 07/12/2019  . Gastroesophageal reflux disease without esophagitis 07/12/2019  . Encounter for breast cancer screening other than mammogram 07/12/2019  . Dysuria 07/12/2019  . Mild mood disorder ([place]) 06/07/2018  . Generalized anxiety disorder 06/07/2018  . Type 2 diabetes mellitus with hyperglycemia, without long-term current use of insulin ([place]) 12/14/2017  . Impaired fasting glucose 12/14/2017  . Fatigue 12/14/2017  . Abnormal weight gain 12/14/2017  . Vitamin D deficiency 12/14/2017  . Acquired hypothyroidism 12/14/2017    Ponciano Ort, MOT, OTR/L  02/09/2020, 1:25 PM  [facility] [place], [place], [address] Phone: [phone]   Fax:  [phone]  Name: Shantale Holtmeyer MRN: [id number] Date of Birth: May 02, 1976

## 2020-02-10 ENCOUNTER — Other Ambulatory Visit: Payer: Self-pay

## 2020-02-10 ENCOUNTER — Encounter (HOSPITAL_COMMUNITY): Payer: Self-pay | Admitting: Family

## 2020-02-10 ENCOUNTER — Other Ambulatory Visit (HOSPITAL_COMMUNITY): Payer: 59 | Admitting: Licensed Clinical Social Worker

## 2020-02-10 ENCOUNTER — Encounter (HOSPITAL_COMMUNITY): Payer: Self-pay

## 2020-02-10 ENCOUNTER — Other Ambulatory Visit (HOSPITAL_COMMUNITY): Payer: 59 | Admitting: Occupational Therapy

## 2020-02-10 DIAGNOSIS — F332 Major depressive disorder, recurrent severe without psychotic features: Secondary | ICD-10-CM

## 2020-02-10 DIAGNOSIS — R41844 Frontal lobe and executive function deficit: Secondary | ICD-10-CM

## 2020-02-10 DIAGNOSIS — R4589 Other symptoms and signs involving emotional state: Secondary | ICD-10-CM

## 2020-02-10 DIAGNOSIS — F411 Generalized anxiety disorder: Secondary | ICD-10-CM

## 2020-02-10 NOTE — Progress Notes (Signed)
Charted in error.

## 2020-02-10 NOTE — Therapy (Signed)
[place], [place], [address] Phone: [phone]   Fax:  [phone] Virtual Visit via Video Note  I connected with Ashley Stephens on 02/10/20 at  12:00 PM EDT by a video enabled telemedicine application and verified that I am speaking with the correct person using two identifiers.   I discussed the limitations of evaluation and management by telemedicine and the availability of in person appointments. The patient expressed understanding and agreed to proceed.   I discussed the assessment and treatment plan with the patient. The patient was provided an opportunity to ask questions and all were answered. The patient agreed with the plan and demonstrated an understanding of the instructions.   The patient was advised to call back or seek an in-person evaluation if the symptoms worsen or if the condition fails to improve as anticipated.  I provided 50 minutes of non-face-to-face time during this encounter.    Occupational Therapy Treatment  Patient Details  Name: Ashley Stephens MRN: [id number] Date of Birth: Aug 16, 1976 Referring Provider (OT): Ricky Ala   Encounter Date: 02/10/2020  OT End of Session - 02/10/20 1605    Visit Number  6    Number of Visits  20    Date for OT Re-Evaluation  03/01/20    Authorization Type  [employer]    Authorization - Number of Visits  60    OT Start Time  1200    OT Stop Time  1250    OT Time Calculation (min)  50 min    Activity Tolerance  Patient tolerated treatment well    Behavior During Therapy  [facility] for tasks assessed/performed       Past Medical History:  Diagnosis Date  . Anxiety   . Depression   . Gastric ulcer   . GERD (gastroesophageal reflux disease)   . Hypertension   . Osteoarthritis   . Thyroid disorder     Past Surgical History:  Procedure Laterality Date  . ABDOMINAL HYSTERECTOMY  2013  . BARIATRIC SURGERY    . BREAST  BIOPSY  1995  . GASTRIC BYPASS  2012  . TUBAL LIGATION  1996    There were no vitals filed for this visit.  Subjective Assessment - 02/10/20 1604    Currently in Pain?  No/denies        OT Education - 02/10/20 1604    Education Details  Educated on mental health benefits of exercise while also engaging in active movement practice of chair yoga and brief meditation.    Person(s) Educated  Patient    Methods  Explanation;Handout;Demonstration    Comprehension  Verbalized understanding;Returned demonstration       OT Short Term Goals - 02/03/20 1440      OT SHORT TERM GOAL #1   Status  On-going      OT SHORT TERM GOAL #2   Status  On-going      OT SHORT TERM GOAL #3   Status  On-going      OT SHORT TERM GOAL #4   Status  On-going     Group Session:  S: "A long time ago I used to practice meditation by sitting in a room alone, by myself, in complete silence to be void of any thoughts. It is REALLY hard."  O: Group session encouraged increased participation and engagement through discussion focused on mental health benefits of exercise. Group discussion also identified various tips and strategies to begin an  exercise routine, by starting small, once a week, and building it into a consistent routine. Group members were also provided with a self-assessment identifying specific barriers to being active, with additional suggestions and strategies to address identified barriers. Following discussion, group members engaged in an active movement practice where the OT guided members through a variety of different stretches and chair yoga exercises. Patients were encouraged to engage in some form of active movement, exercise, or practice at least 1x day in order to improve overall health and wellness.    A: Ashley Stephens was active in her participation of discussion and active practice. She identified benefit of engaging in active movement and shared about her past experience with the benefits  of meditation and her mental health. She shared a goal of wanting to be more active moving forward and appeared receptive to the challenge of engaging in an active movement or exercise this afternoon.   P: Continue to attend PHP OT group sessions 5x week for 2 weeks to promote daily structure, social engagement, and opportunities to develop and utilize adaptive strategies to maximize functional performance in preparation for safe transition and integration back into school, work, and the community. Plan to address topic of work/life balance and time management in next OT group session.   Plan - 02/10/20 1605    Occupational performance deficits (Please refer to evaluation for details):  ADL's;IADL's;Rest and Sleep;Work;Leisure;Social Participation    Body Structure / Function / Physical Skills  ADL    Cognitive Skills  Attention;Emotional;Energy/Drive;Problem Solve;Temperament/Personality    Psychosocial Skills  Coping Strategies;Interpersonal Interaction;Routines and Behaviors;Habits       Patient will benefit from skilled therapeutic intervention in order to improve the following deficits and impairments:   Body Structure / Function / Physical Skills: ADL Cognitive Skills: Attention, Emotional, Energy/Drive, Problem Solve, Temperament/Personality Psychosocial Skills: Coping Strategies, Interpersonal Interaction, Routines and Behaviors, Habits   Visit Diagnosis: Difficulty coping  Frontal lobe and executive function deficit  Severe episode of recurrent major depressive disorder, without psychotic features [facility])    Problem List Patient Active Problem List   Diagnosis Date Noted  . Major depressive disorder, recurrent episode, severe ([place]) 01/27/2020  . DDD (degenerative disc disease), lumbar 12/16/2019  . B12 deficiency 12/16/2019  . Encounter for general adult medical examination with abnormal findings 07/12/2019  . Borderline diabetes 07/12/2019  . Gastroesophageal reflux  disease without esophagitis 07/12/2019  . Encounter for breast cancer screening other than mammogram 07/12/2019  . Dysuria 07/12/2019  . Mild mood disorder ([place]) 06/07/2018  . Generalized anxiety disorder 06/07/2018  . Type 2 diabetes mellitus with hyperglycemia, without long-term current use of insulin ([place]) 12/14/2017  . Impaired fasting glucose 12/14/2017  . Fatigue 12/14/2017  . Abnormal weight gain 12/14/2017  . Vitamin D deficiency 12/14/2017  . Acquired hypothyroidism 12/14/2017    Ponciano Ort, MOT, OTR/L  02/10/2020, 4:05 PM  [place], [place], [address] Phone: [phone]   Fax:  [phone]  Name: Monifa Blanchette MRN: [id number] Date of Birth: 1976/09/03

## 2020-02-11 ENCOUNTER — Other Ambulatory Visit (HOSPITAL_COMMUNITY): Payer: 59 | Admitting: Occupational Therapy

## 2020-02-11 ENCOUNTER — Other Ambulatory Visit: Payer: Self-pay

## 2020-02-11 ENCOUNTER — Encounter (HOSPITAL_COMMUNITY): Payer: Self-pay

## 2020-02-11 ENCOUNTER — Other Ambulatory Visit (HOSPITAL_COMMUNITY): Payer: 59 | Admitting: Licensed Clinical Social Worker

## 2020-02-11 DIAGNOSIS — F411 Generalized anxiety disorder: Secondary | ICD-10-CM

## 2020-02-11 DIAGNOSIS — F332 Major depressive disorder, recurrent severe without psychotic features: Secondary | ICD-10-CM

## 2020-02-11 DIAGNOSIS — R41844 Frontal lobe and executive function deficit: Secondary | ICD-10-CM

## 2020-02-11 DIAGNOSIS — R4589 Other symptoms and signs involving emotional state: Secondary | ICD-10-CM

## 2020-02-11 NOTE — Therapy (Signed)
[place], [place], [address] Phone: [phone]   Fax:  [phone] Virtual Visit via Video Note  I connected with Ashley Stephens on 02/11/20 at  11:00 AM EDT by a video enabled telemedicine application and verified that I am speaking with the correct person using two identifiers.   I discussed the limitations of evaluation and management by telemedicine and the availability of in person appointments. The patient expressed understanding and agreed to proceed.   I discussed the assessment and treatment plan with the patient. The patient was provided an opportunity to ask questions and all were answered. The patient agreed with the plan and demonstrated an understanding of the instructions.   The patient was advised to call back or seek an in-person evaluation if the symptoms worsen or if the condition fails to improve as anticipated.  I provided 60 minutes of non-face-to-face time during this encounter.  Occupational Therapy Treatment  Patient Details  Name: Ashley Stephens MRN: [id number] Date of Birth: 1976-01-30 Referring Provider (OT): Ricky Ala   Encounter Date: 02/11/2020   OT End of Session - 02/11/20 1244    Visit Number 7    Number of Visits 20    Date for OT Re-Evaluation 03/01/20    Authorization Type [employer]et[employer]    Authorization - Number of Visits 60    OT Start Time 1100    OT Stop Time 1200    OT Time Calculation (min) 60 min    Activity Tolerance Patient tolerated treatment well    Behavior During Therapy WFL for tasks assessed/performed           Past Medical History:  Diagnosis Date  . Anxiety   . Depression   . Gastric ulcer   . GERD (gastroesophageal reflux disease)   . Hypertension   . Osteoarthritis   . Thyroid disorder     Past Surgical History:  Procedure Laterality Date  . ABDOMINAL HYSTERECTOMY  2013  . BARIATRIC SURGERY    . BREAST BIOPSY   1995  . GASTRIC BYPASS  2012  . TUBAL LIGATION  1996    There were no vitals filed for this visit.   Subjective Assessment - 02/11/20 1243    Currently in Pain? No/denies             OT Education - 02/11/20 1244    Education Details Educated and provided strategies to improve work/life balance responsibilities    Person(s) Educated Patient    Methods Explanation    Comprehension Verbalized understanding            OT Short Term Goals - 02/03/20 1440      OT SHORT TERM GOAL #1   Status On-going      OT SHORT TERM GOAL #2   Status On-going      OT SHORT TERM GOAL #3   Status On-going      OT SHORT TERM GOAL #4   Status On-going         Group Session:  S: "I push myself to work, work, work and answer as many calls as I can. I'm on a productivity clock. Then I get burned out by the end of the day and all I want to do is sign off, but I can't."   O: Group began with a review from previous OT session focused on physical activity/exercise and patients shared what active movement they engaged in yesterday as a goal  to incorporate more exercise into their daily routine. Today's group session wrapped up discussion from previous session focused on work-life balance. Discussion focused on identifying specific strategies to balance work responsibilities, including delegating tasks, creating short-term goals, prioritizing, asking for flexibility, and taking breaks. Group members identified one area in which they struggle most with and brainstormed an effective strategy to address this.    A: Bethanne was active and engaged in her participation of discussion and task. Pt shared that she ran errands yesterday as her active movement practice, identifying that she parked further away in the parking lot with intent to walk further to get her exercise. She also identified difficulty balancing her work and life because she works from home. Pt shared that she is also on a productivity time  clock and often makes herself answer phone call after phone call for hours on end without taking a break. Pt shared that this often leads to frustration and burn out at the end of the day. She appeared receptive to feedback and thought to schedule a 5 minute break into her phone with an alarm, noting she has to hold herself accountable, instead of skipping over a break, knowing her energy level will pay off later in the day.   P: Continue to attend PHP OT group sessions 5x week for 2 weeks to promote daily structure, social engagement, and opportunities to develop and utilize adaptive strategies to maximize functional performance in preparation for safe transition and integration back into school, work, and the community. Plan to address topic of circle of control in next OT group session.    Plan - 02/11/20 1244    Occupational performance deficits (Please refer to evaluation for details): ADL's;IADL's;Rest and Sleep;Work;Leisure;Social Participation    Body Structure / Function / Physical Skills ADL    Cognitive Skills Attention;Emotional;Energy/Drive;Problem Solve;Temperament/Personality    Psychosocial Skills Coping Strategies;Interpersonal Interaction;Routines and Behaviors;Habits           Patient will benefit from skilled therapeutic intervention in order to improve the following deficits and impairments:   Body Structure / Function / Physical Skills: ADL Cognitive Skills: Attention, Emotional, Energy/Drive, Problem Solve, Temperament/Personality Psychosocial Skills: Coping Strategies, Interpersonal Interaction, Routines and Behaviors, Habits   Visit Diagnosis: Difficulty coping  Frontal lobe and executive function deficit  Severe episode of recurrent major depressive disorder, without psychotic features [facility])    Problem List Patient Active Problem List   Diagnosis Date Noted  . Major depressive disorder, recurrent episode, severe ([place]) 01/27/2020  . DDD (degenerative disc  disease), lumbar 12/16/2019  . B12 deficiency 12/16/2019  . Encounter for general adult medical examination with abnormal findings 07/12/2019  . Borderline diabetes 07/12/2019  . Gastroesophageal reflux disease without esophagitis 07/12/2019  . Encounter for breast cancer screening other than mammogram 07/12/2019  . Dysuria 07/12/2019  . Mild mood disorder ([place]) 06/07/2018  . Generalized anxiety disorder 06/07/2018  . Type 2 diabetes mellitus with hyperglycemia, without long-term current use of insulin ([place]) 12/14/2017  . Impaired fasting glucose 12/14/2017  . Fatigue 12/14/2017  . Abnormal weight gain 12/14/2017  . Vitamin D deficiency 12/14/2017  . Acquired hypothyroidism 12/14/2017    Ponciano Ort, MOT, OTR/L  02/11/2020, 12:45 PM  [place], [place], [address] Phone: [phone]   Fax:  [phone]  Name: Kristin Barcus MRN: [id number] Date of Birth: [birth year]

## 2020-02-12 ENCOUNTER — Other Ambulatory Visit (HOSPITAL_COMMUNITY): Payer: 59 | Admitting: Licensed Clinical Social Worker

## 2020-02-12 ENCOUNTER — Other Ambulatory Visit (HOSPITAL_COMMUNITY): Payer: 59 | Admitting: Occupational Therapy

## 2020-02-12 ENCOUNTER — Encounter (HOSPITAL_COMMUNITY): Payer: Self-pay

## 2020-02-12 ENCOUNTER — Other Ambulatory Visit: Payer: Self-pay

## 2020-02-12 DIAGNOSIS — F332 Major depressive disorder, recurrent severe without psychotic features: Secondary | ICD-10-CM

## 2020-02-12 DIAGNOSIS — R4589 Other symptoms and signs involving emotional state: Secondary | ICD-10-CM

## 2020-02-12 DIAGNOSIS — F411 Generalized anxiety disorder: Secondary | ICD-10-CM

## 2020-02-12 DIAGNOSIS — R41844 Frontal lobe and executive function deficit: Secondary | ICD-10-CM

## 2020-02-12 NOTE — Therapy (Signed)
[place], [place], [address] Phone: [phone]   Fax:  [phone] Virtual Visit via Video Note  I connected with Ashley Land on 02/12/20 at  11:00 AM EDT by a video enabled telemedicine application and verified that I am speaking with the correct person using two identifiers.   I discussed the limitations of evaluation and management by telemedicine and the availability of in person appointments. The patient expressed understanding and agreed to proceed.   I discussed the assessment and treatment plan with the patient. The patient was provided an opportunity to ask questions and all were answered. The patient agreed with the plan and demonstrated an understanding of the instructions.   The patient was advised to call back or seek an in-person evaluation if the symptoms worsen or if the condition fails to improve as anticipated.  I provided 65 minutes of non-face-to-face time during this encounter.   Occupational Therapy Treatment  Patient Details  Name: Ashley Stephens MRN: [id number] Date of Birth: Jul 21, 1976 Referring Provider (OT): Ricky Ala   Encounter Date: 02/12/2020   OT End of Session - 02/12/20 1242    Visit Number 8    Number of Visits 20    Date for OT Re-Evaluation 03/01/20    Authorization Type [employer]et[employer]    Authorization - Number of Visits 60    OT Start Time 1100    OT Stop Time 1205    OT Time Calculation (min) 65 min    Activity Tolerance Patient tolerated treatment well    Behavior During Therapy WFL for tasks assessed/performed           Past Medical History:  Diagnosis Date  . Anxiety   . Depression   . Gastric ulcer   . GERD (gastroesophageal reflux disease)   . Hypertension   . Osteoarthritis   . Thyroid disorder     Past Surgical History:  Procedure Laterality Date  . ABDOMINAL HYSTERECTOMY  2013  . BARIATRIC SURGERY    . BREAST BIOPSY   1995  . GASTRIC BYPASS  2012  . TUBAL LIGATION  1996    There were no vitals filed for this visit.   Subjective Assessment - 02/12/20 1241    Currently in Pain? No/denies              OT Education - 02/12/20 1241    Education Details Educated on identifying worry and utilized circle on control tool to categorize what we do and do not have control over    Person(s) Educated Patient    Methods Explanation;Handout    Comprehension Verbalized understanding            OT Short Term Goals - 02/03/20 1440      OT SHORT TERM GOAL #1   Status On-going      OT SHORT TERM GOAL #2   Status On-going      OT SHORT TERM GOAL #3   Status On-going      OT SHORT TERM GOAL #4   Status On-going         Group Session:  S: "I worry about work, my relationships, and my health. There are things I can and cannot control about all of those, but I have to talk it out."  O: Group session encouraged increased participation and engagement through discussion focused on worry and our circle of control. Group reviewed a powerpoint that discussed healthy vs unhealthy worry with  specific examples provided. Discussion also focused on utilizing the circle of control outline to identify what is within our control, what we have influence on, and what is not in our control. Group members shared specific examples and worries and identified what categories they fell in within the circle of control.   A: Ashley Stephens was active and engaged in her participation of discussion and activity. Pt identified a variety of categories within her life that she is worried about, including work, relationships with family, romantic relationships, and her health. She talked through what she could control and not control within each of those categories, though noted it is difficult to recognize what she can influence and what she cannot. Pt shared that her work causes her a great deal of stress and she cannot control her schedule  of hours worked (11a to 8p) however, she can control when she takes her breaks. Appeared receptive to education and feedback received from peers and OT.   P: Continue to attend PHP OT group sessions 5x week for 2 weeks to promote daily structure, social engagement, and opportunities to develop and utilize adaptive strategies to maximize functional performance in preparation for safe transition and integration back into school, work, and the community. Plan to address topic of goal-setting in next OT group session.    Plan - 02/12/20 1242    Occupational performance deficits (Please refer to evaluation for details): ADL's;IADL's;Rest and Sleep;Work;Leisure;Social Participation    Body Structure / Function / Physical Skills ADL    Cognitive Skills Attention;Emotional;Energy/Drive;Problem Solve;Temperament/Personality    Psychosocial Skills Coping Strategies;Interpersonal Interaction;Routines and Behaviors;Habits           Patient will benefit from skilled therapeutic intervention in order to improve the following deficits and impairments:   Body Structure / Function / Physical Skills: ADL Cognitive Skills: Attention, Emotional, Energy/Drive, Problem Solve, Temperament/Personality Psychosocial Skills: Coping Strategies, Interpersonal Interaction, Routines and Behaviors, Habits   Visit Diagnosis: Difficulty coping  Frontal lobe and executive function deficit  Severe episode of recurrent major depressive disorder, without psychotic features [facility])    Problem List Patient Active Problem List   Diagnosis Date Noted  . Major depressive disorder, recurrent episode, severe ([place]) 01/27/2020  . DDD (degenerative disc disease), lumbar 12/16/2019  . B12 deficiency 12/16/2019  . Encounter for general adult medical examination with abnormal findings 07/12/2019  . Borderline diabetes 07/12/2019  . Gastroesophageal reflux disease without esophagitis 07/12/2019  . Encounter for breast cancer  screening other than mammogram 07/12/2019  . Dysuria 07/12/2019  . Mild mood disorder ([place]) 06/07/2018  . Generalized anxiety disorder 06/07/2018  . Type 2 diabetes mellitus with hyperglycemia, without long-term current use of insulin ([place]) 12/14/2017  . Impaired fasting glucose 12/14/2017  . Fatigue 12/14/2017  . Abnormal weight gain 12/14/2017  . Vitamin D deficiency 12/14/2017  . Acquired hypothyroidism 12/14/2017    Ponciano Ort, MOT, OTR/L  02/12/2020, 12:43 PM  [facility] [place], [place], [address] Phone: [phone]   Fax:  [phone]  Name: Ashley Stephens MRN: [id number] Date of Birth: June 10, 1976

## 2020-02-15 ENCOUNTER — Other Ambulatory Visit (HOSPITAL_COMMUNITY): Payer: 59

## 2020-02-15 ENCOUNTER — Other Ambulatory Visit: Payer: Self-pay

## 2020-02-16 ENCOUNTER — Other Ambulatory Visit (HOSPITAL_COMMUNITY): Payer: 59 | Admitting: Occupational Therapy

## 2020-02-16 ENCOUNTER — Other Ambulatory Visit (HOSPITAL_COMMUNITY): Payer: 59 | Admitting: Licensed Clinical Social Worker

## 2020-02-16 ENCOUNTER — Encounter (HOSPITAL_COMMUNITY): Payer: Self-pay

## 2020-02-16 ENCOUNTER — Other Ambulatory Visit: Payer: Self-pay

## 2020-02-16 DIAGNOSIS — F332 Major depressive disorder, recurrent severe without psychotic features: Secondary | ICD-10-CM

## 2020-02-16 DIAGNOSIS — R41844 Frontal lobe and executive function deficit: Secondary | ICD-10-CM

## 2020-02-16 DIAGNOSIS — R4589 Other symptoms and signs involving emotional state: Secondary | ICD-10-CM

## 2020-02-16 DIAGNOSIS — F411 Generalized anxiety disorder: Secondary | ICD-10-CM

## 2020-02-16 NOTE — Therapy (Signed)
[place], [place], [address] Phone: [phone]   Fax:  [phone] Virtual Visit via Video Note  I connected with Ashley Stephens on 02/16/20 at  11:00 AM EDT by a video enabled telemedicine application and verified that I am speaking with the correct person using two identifiers.   I discussed the limitations of evaluation and management by telemedicine and the availability of in person appointments. The patient expressed understanding and agreed to proceed.   I discussed the assessment and treatment plan with the patient. The patient was provided an opportunity to ask questions and all were answered. The patient agreed with the plan and demonstrated an understanding of the instructions.   The patient was advised to call back or seek an in-person evaluation if the symptoms worsen or if the condition fails to improve as anticipated.  I provided 60 minutes of non-face-to-face time during this encounter.   Occupational Therapy Treatment  Patient Details  Name: Ashley Stephens MRN: [id number] Date of Birth: Aug 23, 1976 Referring Provider (OT): Ricky Ala   Encounter Date: 02/16/2020   OT End of Session - 02/16/20 1238    Visit Number 9    Number of Visits 20    Date for OT Re-Evaluation 03/01/20    Authorization Type [employer]    Authorization - Number of Visits 5    OT Start Time 1105    OT Stop Time 1205    OT Time Calculation (min) 60 min    Activity Tolerance Patient tolerated treatment well    Behavior During Therapy WFL for tasks assessed/performed           Past Medical History:  Diagnosis Date  . Anxiety   . Depression   . Gastric ulcer   . GERD (gastroesophageal reflux disease)   . Hypertension   . Osteoarthritis   . Thyroid disorder     Past Surgical History:  Procedure Laterality Date  . ABDOMINAL HYSTERECTOMY  2013  . BARIATRIC SURGERY    . BREAST BIOPSY   1995  . GASTRIC BYPASS  2012  . TUBAL LIGATION  1996    There were no vitals filed for this visit.   Subjective Assessment - 02/16/20 1238    Currently in Pain? No/denies              OT Education - 02/16/20 1238    Education Details Educated on low vs high self-esteem, along with strategies to improve our self-worth with use of positive affirmations    Person(s) Educated Patient    Methods Explanation;Handout    Comprehension Verbalized understanding            OT Short Term Goals - 02/03/20 1440      OT SHORT TERM GOAL #1   Status On-going      OT SHORT TERM GOAL #2   Status On-going      OT SHORT TERM GOAL #3   Status On-going      OT SHORT TERM GOAL #4   Status On-going         Group Session:  S: "I am my own hype person or hype woman. I hype other people up too, but it's not something that other people give to me, so I do it for myself."  O: Today's group session encouraged increased engagement and participation through discussion focused on self-esteem. Topic of discussion focused on identifying differences between low vs high self-esteem and identified ways  in which our self-esteem impacts our daily lives including: self-criticism, ignoring positive qualities, negative emotions, work/school performance, relationships, leisure engagement, and self-care. Group members further identified ways in which their self-esteem directly impacts their daily function. Tips and strategies were shared to boost and build-up our low self-esteem, with a strong focus on positive affirmations and self-talk. Group members identified which positive affirmations stood out most meaningfully to them and shared with the group.   A: Ashley Stephens was active and independent in her participation of discussion and activity. Pt initially shared that she has high self-esteem, however as group progressed, was able to recognize that self-esteem does not have to with just your physical appearance and  body positivity, but also how you feel about yourself and your actions internally. Pt shared that she often focused immediately on the negative and has to act as her own "hype woman" in order to make herself feel good, when others don't do that for her. Pt shared her positive affirmation "I matter" and noted that she has to remind herself, she does matter, because if she does not think it, no one else will.   P: Continue to attend PHP OT group sessions 5x week for 2 weeks to promote daily structure, social engagement, and opportunities to develop and utilize adaptive strategies to maximize functional performance in preparation for safe transition and integration back into school, work, and the community. Plan to address topic of identifying triggers in next OT group session.    Plan - 02/16/20 1238    Occupational performance deficits (Please refer to evaluation for details): ADL's;IADL's;Rest and Sleep;Work;Leisure;Social Participation    Body Structure / Function / Physical Skills ADL    Cognitive Skills Attention;Emotional;Energy/Drive;Problem Solve;Temperament/Personality    Psychosocial Skills Coping Strategies;Interpersonal Interaction;Routines and Behaviors;Habits           Patient will benefit from skilled therapeutic intervention in order to improve the following deficits and impairments:   Body Structure / Function / Physical Skills: ADL Cognitive Skills: Attention, Emotional, Energy/Drive, Problem Solve, Temperament/Personality Psychosocial Skills: Coping Strategies, Interpersonal Interaction, Routines and Behaviors, Habits   Visit Diagnosis: Difficulty coping  Frontal lobe and executive function deficit  Severe episode of recurrent major depressive disorder, without psychotic features [facility])    Problem List Patient Active Problem List   Diagnosis Date Noted  . Major depressive disorder, recurrent episode, severe ([place]) 01/27/2020  . DDD (degenerative disc disease), lumbar  12/16/2019  . B12 deficiency 12/16/2019  . Encounter for general adult medical examination with abnormal findings 07/12/2019  . Borderline diabetes 07/12/2019  . Gastroesophageal reflux disease without esophagitis 07/12/2019  . Encounter for breast cancer screening other than mammogram 07/12/2019  . Dysuria 07/12/2019  . Mild mood disorder ([place]) 06/07/2018  . Generalized anxiety disorder 06/07/2018  . Type 2 diabetes mellitus with hyperglycemia, without long-term current use of insulin ([place]) 12/14/2017  . Impaired fasting glucose 12/14/2017  . Fatigue 12/14/2017  . Abnormal weight gain 12/14/2017  . Vitamin D deficiency 12/14/2017  . Acquired hypothyroidism 12/14/2017    Ponciano Ort, MOT, OTR/L  02/16/2020, 12:39 PM  [facility] HOSPITALIZATION PROGRAM [place], [place], [address] Phone: [phone]   Fax:  [phone]  Name: Ashley Stephens MRN: [id number] Date of Birth: 1976-07-15

## 2020-02-16 NOTE — Progress Notes (Signed)
Spoke with patient via Webex video call, used 2 identifiers to correctly identify patient. States that groups are going OK, feels they have gotten better and she is not feeling attacked or offended. Denies SI/HI or AV hallucinations. On scale 1-10 as 10 being worst she rates depression at 5 and anxiety at 5. No side effects from medication, no issues or complaints.

## 2020-02-17 ENCOUNTER — Encounter (HOSPITAL_COMMUNITY): Payer: Self-pay

## 2020-02-17 ENCOUNTER — Other Ambulatory Visit (HOSPITAL_COMMUNITY): Payer: 59 | Admitting: Occupational Therapy

## 2020-02-17 ENCOUNTER — Other Ambulatory Visit: Payer: Self-pay

## 2020-02-17 ENCOUNTER — Other Ambulatory Visit (HOSPITAL_COMMUNITY): Payer: 59 | Admitting: Professional

## 2020-02-17 DIAGNOSIS — F332 Major depressive disorder, recurrent severe without psychotic features: Secondary | ICD-10-CM | POA: Diagnosis not present

## 2020-02-17 DIAGNOSIS — R4589 Other symptoms and signs involving emotional state: Secondary | ICD-10-CM

## 2020-02-17 DIAGNOSIS — F411 Generalized anxiety disorder: Secondary | ICD-10-CM

## 2020-02-17 DIAGNOSIS — R41844 Frontal lobe and executive function deficit: Secondary | ICD-10-CM

## 2020-02-17 NOTE — Psych (Signed)
Virtual Visit via Video Note  I connected with Ashley Stephens on 02/08/20 at  9:00 AM EDT by a video enabled telemedicine application and verified that I am speaking with the correct person using two identifiers.   I discussed the limitations of evaluation and management by telemedicine and the availability of in person appointments. The patient expressed understanding and agreed to proceed.  I discussed the assessment and treatment plan with the patient. The patient was provided an opportunity to ask questions and all were answered. The patient agreed with the plan and demonstrated an understanding of the instructions.   The patient was advised to call back or seek an in-person evaluation if the symptoms worsen or if the condition fails to improve as anticipated.  Pt was provided 240 minutes of non-face-to-face time during this encounter.   Ashley Glass, LCSW    [facility] PHP THERAPIST PROGRESS NOTE  Ashley Stephens [id number]  Session Time: 9:00 - 10:00  Participation Level: Active  Behavioral Response: CasualAlertDepressed  Type of Therapy: Group Therapy  Treatment Goals addressed: Coping  Interventions: CBT, DBT, Supportive and Reframing  Summary: Clinician led check-in regarding current stressors and situation, and review of patient completed daily inventory. Clinician utilized active listening and empathetic response and validated patient emotions. Clinician facilitated processing group on pertinent issues.   Therapist Response:Ashley Stephens is a 44 y.o. female who presents with depression and anxiety symptoms. Patient arrived within time allowed and reports that she is feeling "upset." Patient rates her mood at a 6 on a scale of 1-10 with 10 being great. Pt reports she found out people were showing her instagram around and this made her angry. Pt states prior to last night when she found this out her weekend went well. Pt reports poor sleep due to  rumination. Pt struggles with anger and thought distortions.. Pt engaged in discussion.       Session Time: 10:00-11:00  Participation Level:Active  Behavioral Response:CasualAlertDepressed  Type of Therapy:Group Therapy  Treatment Goals addressed: Coping  Interventions:CBT, DBT, Solution Focused, Supportive and Reframing  Summary: Cln led discussion on personal responsibility. Group members shared ways in which insecurity and difficulty trusting others lead to taking more responsibility than appropriate as a way to protect  themselves. Cln synthesized aspects of CBT, boundaries, and self-esteem to direct conversation.   Therapist Response:  Pt engaged in discussion and reports choosing self-blame rather than possible rejection. Pt able to process and support other group members.       Session Time: 11:00- 12:00  Participation Level:Active  Behavioral Response:CasualAlertDepressed  Type of Therapy:Group Therapy  Treatment Goals addressed: Coping  Interventions:CBT, DBT, Solution Focused, Supportive and Reframing  Summary: Cln introduced CBT's cognitive triangle and the connection between thoughts, feelings, and actions. Cln introduced cognitive distortions, the irrtational thought patterns that impair our ability to improve our cognitive triangles. Group members shared ways in which they struggle to recognize the difference between thoughts, feelings, and actions.   Therapist Response:  Pt engaged in discussion and reports understanding of the cognitive triangle and how distortions may be blurring their reality.      Session Time: 12:00 -1:00  Participation Level:Active  Behavioral Response:CasualAlertDepressed  Type of Therapy: Group Therapy, OT  Treatment Goals addressed: Coping  Interventions:Psychosocial skills training, Supportive  Summary:12:00 - 12:50: Occupational Therapy group 12:50 -1:00 Clinician led check-out.  Clinician assessed for immediate needs, medication compliance and efficacy, and safety concerns  Therapist Response:Patient engaged in group. See OT note.  12:50 - 1:00: At check-out, patientrates hermood at [facility] on a scale of 1-10 with 10 being great.Pt states afternoon plans of working on paperwork for her job and doing chores.Patient demonstratessomeprogress as evidenced by willingness to consider new ideas.Patient denies SI/HI/self-harm at the end of group.    Suicidal/Homicidal: Nowithout intent/plan  Plan: Pt will continue in PHP while working to decrease depression and anxiety symptoms, increase ability to manage symptoms in a healthy manner, and increase ability to regulate emotions.   Diagnosis: Severe episode of recurrent major depressive disorder, without psychotic features ([place]) [F33.2]    1. Severe episode of recurrent major depressive disorder, without psychotic features ([place])   2. Generalized anxiety disorder       Ashley Glass, LCSW 02/17/2020

## 2020-02-17 NOTE — Progress Notes (Signed)
Pt attended spiritual care group  02/17/2020  11:00-12:00.  Group met via web-ex due to COVID-19 precautions.  Group facilitated by Simone Curia, MDiv, [place]   Group focused on topic of "community."  Members reflected on topic in facilitated dialog, identifying responses to topic and notions they hold of community from their previous experience.  Group members utilized value sort cards to identify top qualities they look for in community.  Engaged in facilitated dialog around their value choices, noting origin of these values, how these are realized in their lives, and strategies for engaging these values.    Spiritual care group drew on Motivational Interviewing, Narrative and Adlerian modalities  Ashley Stephens was present throughout group.  Identified "Church and Family" as initial responses to "community"   She noted that these had been strong images of community in her upbringing and she is trying to figure out how they fit in now.  Ashley Stephens identified the value of "forgiveness" from the value sort cards.  Noted that she was navigating forgiving others and accepting forgiveness.  She stated her faith leads her to want to "forget," as "God offers her forgiveness and casts her mis-doings into forgetfulness"  Processed holding boundaries with hurtful relationships while also navigating "forgiveness."  Ashley Stephens connected with another group member around this - wondering how to forgive and "not just give people a free pass."

## 2020-02-17 NOTE — Psych (Signed)
Virtual Visit via Video Note  I connected with Ashley Stephens on 02/02/20 at  9:00 AM EDT by a video enabled telemedicine application and verified that I am speaking with the correct person using two identifiers.   I discussed the limitations of evaluation and management by telemedicine and the availability of in person appointments. The patient expressed understanding and agreed to proceed.  I discussed the assessment and treatment plan with the patient. The patient was provided an opportunity to ask questions and all were answered. The patient agreed with the plan and demonstrated an understanding of the instructions.   The patient was advised to call back or seek an in-person evaluation if the symptoms worsen or if the condition fails to improve as anticipated.  Pt was provided 240 minutes of non-face-to-face time during this encounter.   [name], LCSW    [facility] PHP THERAPIST PROGRESS NOTE  Ashley Stephens [id number]  Session Time: 9:00 - 10:00  Participation Level: Active  Behavioral Response: CasualAlertDepressed  Type of Therapy: Group Therapy  Treatment Goals addressed: Coping  Interventions: CBT, DBT, Supportive and Reframing  Summary: Clinician led check-in regarding current stressors and situation, and review of patient completed daily inventory. Clinician utilized active listening and empathetic response and validated patient emotions. Clinician facilitated processing group on pertinent issues.   Therapist Response:Ashley Stephens is a 44 y.o. female who presents with depression and anxiety symptoms. Patient arrived within time allowed and reports that she is feeling "not so great." Patient rates her mood at a 5 on a scale of 1-10 with 10 being great. Pt reports her weekend was "not so great" and she struggled with family dynamics. Pt states her mood was low throughout the long weekend and she isolated to manage. Pt states struggling  with sleep last night due to anxiety. Pt engaged in discussion.       Session Time: 10:00-11:00  Participation Level:Active  Behavioral Response:CasualAlertDepressed  Type of Therapy:Group Therapy  Treatment Goals addressed: Coping  Interventions:CBT, DBT, Solution Focused, Supportive and Reframing  Summary: Cln led discussion on giving when we are exhausted. Group members shared feelings they have when they are unable to support or give to others. Pt's shared ways they struggle when they are feeling depleted and are used to being the one who handles things in their life.   Therapist Response: Pt engaged in discussion and reports feeling guilty when she sets limits with others.       Session Time: 11:00- 12:00  Participation Level:Active  Behavioral Response:CasualAlertDepressed  Type of Therapy: Group Therapy, OT  Treatment Goals addressed: Coping  Interventions:Psychosocial skills training, Supportive  Summary:Occupational Therapy group  Therapist Response:Patient engaged in group. See OT note.       Session Time: 12:00 -1:00  Participation Level:Active  Behavioral Response:CasualAlertDepressed  Type of Therapy:Group therapy  Treatment Goals addressed: Coping  Interventions:CBT; Solution focused; Supportive; Reframing  Summary:12:00 - 12:50: Cln introduced topic of boundaries. Group members discussed porous, rigid, and healthy boundary characteristics and identified which characteristics they related to and how this affects their relationships.  12:50 -1:00 Clinician led check-out. Clinician assessed for immediate needs, medication compliance and efficacy, and safety concerns  Therapist Response:12:00 - 12:50: Pt engaged in discussion and reports having predominately porous boundaries.  12:50 - 1:00: At check-out, patientrates hermood at a4 on a scale of 1-10 with 10 being great.Pt states afternoon  plans of working on paperwork for her job.Patient demonstratessomeprogress as evidenced by participation in first group  session.Patient denies SI/HI/self-harm at the end of group.    Suicidal/Homicidal: Nowithout intent/plan  Plan: Pt will continue in PHP while working to decrease depression and anxiety symptoms, increase ability to manage symptoms in a healthy manner, and increase ability to regulate emotions.   Diagnosis: Severe episode of recurrent major depressive disorder, without psychotic features ([place]) [F33.2]    1. Severe episode of recurrent major depressive disorder, without psychotic features ([place])   2. Generalized anxiety disorder       [name], LCSW 02/17/2020

## 2020-02-17 NOTE — Progress Notes (Signed)
Virtual Visit via Video Note  I connected with Ashley Stephens on 02/17/20 at  9:00 AM EDT by a video enabled telemedicine application and verified that I am speaking with the correct person using two identifiers.   I discussed the limitations of evaluation and management by telemedicine and the availability of in person appointments. The patient expressed understanding and agreed to proceed.   I discussed the assessment and treatment plan with the patient. The patient was provided an opportunity to ask questions and all were answered. The patient agreed with the plan and demonstrated an understanding of the instructions.   The patient was advised to call back or seek an in-person evaluation if the symptoms worsen or if the condition fails to improve as anticipated.  I provided 15 minutes of non-face-to-face time during this encounter.   Suella Broad, FNP   BH MD/PA/NP OP Progress Note  02/17/2020 10:42 AM Drue Dun [place] Toure  MRN:  [id number]   Evaluation: Ashley Stephens was seen and evaluated via Webex. She is alert and oriented, calm and cooperative. She has been pleasant throughout the assessment, and engages well with Probation officer. She remains euthymic and is very active in therapeutic milieu. She denies any suicidal ideations, homicidal ideations at this time. She reports being in a good mood " nothing has there me off today. " She identifies one of her goals as "less stress". She reports her goals is an obtainable one and she would like to work on it to reduce her stressors and help with self care and management. She rates her depression and anxiety 5/10 with 10 being the worst on a Likert scale. She has been busy helping her mother take care of her nieces and nephews. She reports she is compliant with her medications.   Support, encouragement and reassurance was provided.    Visit Diagnosis:    ICD-10-CM   1. Severe episode of recurrent major depressive  disorder, without psychotic features ([place])  F33.2   2. Generalized anxiety disorder  F41.1   3. Difficulty coping  R45.89     Past Psychiatric History:   Past Medical History:  Past Medical History:  Diagnosis Date  . Anxiety   . Depression   . Gastric ulcer   . GERD (gastroesophageal reflux disease)   . Hypertension   . Osteoarthritis   . Thyroid disorder     Past Surgical History:  Procedure Laterality Date  . ABDOMINAL HYSTERECTOMY  2013  . BARIATRIC SURGERY    . BREAST BIOPSY  1995  . GASTRIC BYPASS  2012  . TUBAL LIGATION  1996    Family Psychiatric History:  Family History:  Family History  Problem Relation Age of Onset  . Breast cancer Maternal Grandmother     Social History:  Social History   Socioeconomic History  . Marital status: Single    Spouse name: Not on file  . Number of children: Not on file  . Years of education: Not on file  . Highest education level: Not on file  Occupational History  . Not on file  Tobacco Use  . Smoking status: Never Smoker  . Smokeless tobacco: Never Used  Vaping Use  . Vaping Use: Never used  Substance and Sexual Activity  . Alcohol use: No    Alcohol/week: 0.0 standard drinks  . Drug use: No  . Sexual activity: Yes    Birth control/protection: Surgical  Other Topics Concern  . Not on file  Social History Narrative  .  Not on file   Social Determinants of Health   Financial Resource Strain:   . Difficulty of Paying Living Expenses:   Food Insecurity:   . Worried About Charity fundraiser in the Last Year:   . Arboriculturist in the Last Year:   Transportation Needs:   . Film/video editor (Medical):   Marland Kitchen Lack of Transportation (Non-Medical):   Physical Activity:   . Days of Exercise per Week:   . Minutes of Exercise per Session:   Stress:   . Feeling of Stress :   Social Connections:   . Frequency of Communication with Friends and Family:   . Frequency of Social Gatherings with Friends and  Family:   . Attends Religious Services:   . Active Member of Clubs or Organizations:   . Attends Archivist Meetings:   Marland Kitchen Marital Status:     Allergies: No Known Allergies  Metabolic Disorder Labs: Lab Results  Component Value Date   HGBA1C 6.3 (A) 12/03/2019   No results found for: PROLACTIN Lab Results  Component Value Date   CHOL 203 (H) 07/10/2019   TRIG 88 07/10/2019   HDL 43 07/10/2019   CHOLHDL 4.1 12/27/2017   LDLCALC 144 (H) 07/10/2019   LDLCALC 138 (H) 12/27/2017   Lab Results  Component Value Date   TSH 1.770 07/10/2019   TSH 1.880 06/06/2018    Therapeutic Level Labs: No results found for: LITHIUM No results found for: VALPROATE No components found for:  CBMZ  Current Medications: Current Outpatient Medications  Medication Sig Dispense Refill  . ALPRAZolam (XANAX) 0.25 MG tablet Take 1 tablet (0.25 mg total) by mouth 2 (two) times daily as needed. for anxiety 45 tablet 2  . BIOTIN PO Take by mouth.    . Cyanocobalamin (VITAMIN B 12 PO) Take by mouth.    . Cyanocobalamin 1000 MCG/ML KIT Inject as directed.    . cyclobenzaprine (FLEXERIL) 5 MG tablet     . desvenlafaxine (PRISTIQ) 100 MG 24 hr tablet Take 1 tablet (100 mg total) by mouth every morning. 90 tablet 1  . desvenlafaxine (PRISTIQ) 50 MG 24 hr tablet Take 1 tablet (50 mg total) by mouth every evening. 90 tablet 1  . hydrOXYzine (VISTARIL) 50 MG capsule Take 1 capsule by mouth three times daily as needed 90 capsule 1  . lamoTRIgine (LAMICTAL) 150 MG tablet Take 1 tablet (150 mg total) by mouth 2 (two) times daily. 180 tablet 1  . levothyroxine (SYNTHROID) 25 MCG tablet Take 1 tablet (25 mcg total) by mouth daily before breakfast. 90 tablet 2  . lurasidone (LATUDA) 80 MG TABS tablet Take 1 tablet (80 mg total) by mouth daily with breakfast. 30 tablet 3  . metFORMIN (GLUCOPHAGE) 500 MG tablet Take 1 tab po qd x 1 week, then increase to 1 po BID as tolerated 180 tablet 0  . omeprazole  (PRILOSEC) 40 MG capsule Take 1 capsule (40 mg total) by mouth 2 (two) times daily before a meal. (Patient taking differently: Take 40 mg by mouth 2 (two) times daily as needed. ) 30 capsule 3  . Oxcarbazepine (TRILEPTAL) 300 MG tablet Take 1 tablet (300 mg total) by mouth at bedtime. 90 tablet 0  . propranolol (INDERAL) 20 MG tablet Take 1 tablet (20 mg total) by mouth 3 (three) times daily as needed. 270 tablet 1  . sucralfate (CARAFATE) 1 GM/10ML suspension Take 10 mLs (1 g total) by mouth 4 (four) times daily -  with meals and at bedtime. (Patient not taking: Reported on 02/16/2020) 420 mL 0  . Vitamin D, Ergocalciferol, (DRISDOL) 1.25 MG (50000 UT) CAPS capsule Take 1 capsule (50,000 Units total) by mouth every 7 (seven) days. 6 capsule 0   No current facility-administered medications for this visit.     Musculoskeletal:   Psychiatric Specialty Exam: Review of Systems  There were no vitals taken for this visit.There is no height or weight on file to calculate BMI.  General Appearance: Casual  Eye Contact:  Good  Speech:  Clear and Coherent  Volume:  Normal  Mood:  anxious and depressed  Affect:  Appropriate  Thought Process:  Goal Directed, Linear and Descriptions of Associations: Intact  Orientation:  Full (Time, Place, and Person)  Thought Content: WDL   Suicidal Thoughts:  denies  Homicidal Thoughts:  denies  Memory:  Immediate;   Fair Recent;   Fair Remote;   Fair  Judgement:  Intact  Insight:  Good  Psychomotor Activity:  Normal  Concentration:  Concentration: Good and Attention Span: Good  Recall:  Good  Fund of Knowledge: Good  Language: Good  Akathisia:  Negative  Handed:  Right  AIMS (if indicated):  Assets:  Communication Skills Desire for Improvement Financial Resources/Insurance Housing Physical Health Resilience Social Support  ADL's:  Intact  Cognition: WNL  Sleep:  Good   Screenings: PHQ2-9     Counselor from 02/04/2020 in [facility] Office Visit from 12/03/2019 in [facility], [facility] Office Visit from 06/26/2019 in [facility], [facility] Office Visit from 10/03/2018 in [facility], [facility] Office Visit from 06/20/2018 in [facility], [facility]  PHQ-2 Total Score 2 0 0 0 2  PHQ-9 Total Score 13 -- -- -- 15       Assessment and Plan:  Patient to continue partial hospitalization programming Continue medications as directed  Treatment options and alternatives reviewed with patient and patient understands the plan above.  Treatment plan was reviewed and patient agreed upon by nurse practitioner Sheran Fava and patient Ashley Stephens need for group services.    Suella Broad, FNP 02/17/2020, 10:42 AM

## 2020-02-17 NOTE — Psych (Signed)
Virtual Visit via Video Note  I connected with Ashley Stephens on 02/04/20 at  9:00 AM EDT by a video enabled telemedicine application and verified that I am speaking with the correct person using two identifiers.   I discussed the limitations of evaluation and management by telemedicine and the availability of in person appointments. The patient expressed understanding and agreed to proceed.  I discussed the assessment and treatment plan with the patient. The patient was provided an opportunity to ask questions and all were answered. The patient agreed with the plan and demonstrated an understanding of the instructions.   The patient was advised to call back or seek an in-person evaluation if the symptoms worsen or if the condition fails to improve as anticipated.  Pt was provided 240 minutes of non-face-to-face time during this encounter.   [name], LCSW    [facility] PHP THERAPIST PROGRESS NOTE  Ashley Stephens [id number]  Session Time: 9:00 - 10:00  Participation Level: Active  Behavioral Response: CasualAlertDepressed  Type of Therapy: Group Therapy  Treatment Goals addressed: Coping  Interventions: CBT, DBT, Supportive and Reframing  Summary: Clinician led check-in regarding current stressors and situation, and review of patient completed daily inventory. Clinician utilized active listening and empathetic response and validated patient emotions. Clinician facilitated processing group on pertinent issues.   Therapist Response:Ashley Stephens is a 44 y.o. female who presents with depression and anxiety symptoms. Patient arrived within time allowed and reports that she is feeling "tired." Patient rates her mood at a 6 on a scale of 1-10 with 10 being great. Pt reports yesterday was "okay" and she ran errands. Pt states she has to backtrack to finish today and that has upset her and is "consuming." Pt states struggling with rumination. Pt engaged in  discussion.       Session Time: 10:00-11:00  Participation Level:Active  Behavioral Response:CasualAlertDepressed  Type of Therapy:Group Therapy  Treatment Goals addressed: Coping  Interventions:CBT, DBT, Solution Focused, Supportive and Reframing  Summary: Cln led discussion on feeling lost and how to find "me" again. Group members shared struggles they are having with their identity amidst mental health issues. Cln encouraged pt's to try to spend 5 minutes a day doing new activities to seek joy and see what sticks.   Therapist Response:  Pt engaged in discussion and reports feeling like they do not know who they are other than being depressed. Pt able to process.        Session Time: 11:00- 12:00  Participation Level:Active  Behavioral Response:CasualAlertDepressed  Type of Therapy: Group Therapy, OT  Treatment Goals addressed: Coping  Interventions:Psychosocial skills training, Supportive  Summary:Occupational Therapy group  Therapist Response:Patient engaged in group. See OT note.         Session Time: 12:00 -1:00  Participation Level:Active  Behavioral Response:CasualAlertDepressed  Type of Therapy:Group therapy  Treatment Goals addressed: Coping  Interventions:CBT; Solution focused; Supportive; Reframing  Summary:12:00 - 12:50: Cln continued topic of boundaries. Group discussed boundary categories, how to set a boundary, and the common barriers to setting or keeping healthy boundaries.  12:50 -1:00 Clinician led check-out. Clinician assessed for immediate needs, medication compliance and efficacy, and safety concerns  Therapist Response:12:00 - 12:50: Pt engaged in discussion and reports understanding of how to alter their approach with boundaries to see improvement in relationships.   12:50 - 1:00: At check-out, patientrates hermood at a5 on a scale of 1-10 with 10 being great.Pt states  afternoon plans of working on paperwork for  her job and taking a nap.Patient demonstratessomeprogress as evidenced by increased openness..Patient denies SI/HI/self-harm at the end of group.    Suicidal/Homicidal: Nowithout intent/plan  Plan: Pt will continue in PHP while working to decrease depression and anxiety symptoms, increase ability to manage symptoms in a healthy manner, and increase ability to regulate emotions.   Diagnosis: Severe episode of recurrent major depressive disorder, without psychotic features ([place]) [F33.2]    1. Severe episode of recurrent major depressive disorder, without psychotic features ([place])   2. Generalized anxiety disorder       [name], LCSW 02/17/2020

## 2020-02-17 NOTE — Therapy (Signed)
[place], [place], [address] Phone: [phone]   Fax:  [phone] Virtual Visit via Video Note  I connected with Ashley Stephens on 02/17/20 at  12:00 PM EDT by a video enabled telemedicine application and verified that I am speaking with the correct person using two identifiers.   I discussed the limitations of evaluation and management by telemedicine and the availability of in person appointments. The patient expressed understanding and agreed to proceed.   I discussed the assessment and treatment plan with the patient. The patient was provided an opportunity to ask questions and all were answered. The patient agreed with the plan and demonstrated an understanding of the instructions.   The patient was advised to call back or seek an in-person evaluation if the symptoms worsen or if the condition fails to improve as anticipated.  I provided 50 minutes of non-face-to-face time during this encounter.   Occupational Therapy Treatment  Patient Details  Name: Ashley Stephens MRN: [id number] Date of Birth: November 30, 1975 Referring Provider (OT): Ricky Ala   Encounter Date: 02/17/2020   OT End of Session - 02/17/20 1344    Visit Number 10    Number of Visits 20    Date for OT Re-Evaluation 03/01/20    Authorization Type [employer]    Authorization - Number of Visits 60    OT Start Time 1200    OT Stop Time 1250    OT Time Calculation (min) 50 min    Activity Tolerance Patient tolerated treatment well    Behavior During Therapy Ashley Stephens for tasks assessed/performed           Past Medical History:  Diagnosis Date  . Anxiety   . Depression   . Gastric ulcer   . GERD (gastroesophageal reflux disease)   . Hypertension   . Osteoarthritis   . Thyroid disorder     Past Surgical History:  Procedure Laterality Date  . ABDOMINAL HYSTERECTOMY  2013  . BARIATRIC SURGERY    . BREAST BIOPSY   1995  . GASTRIC BYPASS  2012  . TUBAL LIGATION  1996    There were no vitals filed for this visit.   Subjective Assessment - 02/17/20 1344    Currently in Pain? No/denies            OT Education - 02/17/20 1344    Education Details Educated on different types of social support and reviewed strategies/tips to improve and build upon supports identified.    Person(s) Educated Patient    Methods Explanation;Handout    Comprehension Verbalized understanding            OT Short Term Goals - 02/03/20 1440      OT SHORT TERM GOAL #1   Status On-going      OT SHORT TERM GOAL #2   Status On-going      OT SHORT TERM GOAL #3   Status On-going      OT SHORT TERM GOAL #4   Status On-going         Group Session:  S: "I only wrote down two people in my bubble. I cut everyone else out because they don't support me the way I need them too or the way I would support them."  O: Group began with a check-in and review of self-esteem and members shared whether they practiced or utilized their positive affirmations identified from previous OT session. Today's group session focused on  topic of social supports. Group members started off by completing a self-assessment to identify their current level of social support by answering a series of true/false questions. Discussion focused on reviewing group members' results and discussed the different types of social support we seek out including emotional support, social support, informational support, and tangible/practical support. Members identified which areas of support they felt most unsupported in and reviewed strategies and tips to build our support networks.    A: Ashley Stephens was active in her participation of activity and discussion. Pt shared that she only identified two supports, a friend and her adult daughter. She shared that she does not have a lot of people she can trust or confide in because those people have also been a source of conflict  in the past, most notably speaking about family members. Pt shared that she feels it is better to cut off those relationships completely from support because they were not healthy relationships. Pt reported that she currently struggles to find people who can support her emotionally, however appeared receptive to feedback and suggestions provided in obtaining an individual therapist and looking at support groups outside of PHP.    P: Continue to attend PHP OT group sessions 5x week for 2 weeks to promote daily structure, social engagement, and opportunities to develop and utilize adaptive strategies to maximize functional performance in preparation for safe transition and integration back into school, work, and the community.     Plan - 02/17/20 1344    Occupational performance deficits (Please refer to evaluation for details): ADL's;IADL's;Rest and Sleep;Work;Leisure;Social Participation    Body Structure / Function / Physical Skills ADL    Cognitive Skills Attention;Emotional;Energy/Drive;Problem Solve;Temperament/Personality    Psychosocial Skills Coping Strategies;Interpersonal Interaction;Routines and Behaviors;Habits           Patient will benefit from skilled therapeutic intervention in order to improve the following deficits and impairments:   Body Structure / Function / Physical Skills: ADL Cognitive Skills: Attention, Emotional, Energy/Drive, Problem Solve, Temperament/Personality Psychosocial Skills: Coping Strategies, Interpersonal Interaction, Routines and Behaviors, Habits   Visit Diagnosis: Difficulty coping  Frontal lobe and executive function deficit  Severe episode of recurrent major depressive disorder, without psychotic features [facility])    Problem List Patient Active Problem List   Diagnosis Date Noted  . Major depressive disorder, recurrent episode, severe ([place]) 01/27/2020  . DDD (degenerative disc disease), lumbar 12/16/2019  . B12 deficiency 12/16/2019  .  Encounter for general adult medical examination with abnormal findings 07/12/2019  . Borderline diabetes 07/12/2019  . Gastroesophageal reflux disease without esophagitis 07/12/2019  . Encounter for breast cancer screening other than mammogram 07/12/2019  . Dysuria 07/12/2019  . Mild mood disorder ([place]) 06/07/2018  . Generalized anxiety disorder 06/07/2018  . Type 2 diabetes mellitus with hyperglycemia, without long-term current use of insulin ([place]) 12/14/2017  . Impaired fasting glucose 12/14/2017  . Fatigue 12/14/2017  . Abnormal weight gain 12/14/2017  . Vitamin D deficiency 12/14/2017  . Acquired hypothyroidism 12/14/2017    Ponciano Ort, MOT, OTR/L  02/17/2020, 1:45 PM  [place], [place], [address] Phone: [phone]   Fax:  [phone]  Name: Ashley Stephens MRN: [id number] Date of Birth: Feb 22, 1976

## 2020-02-18 ENCOUNTER — Other Ambulatory Visit (HOSPITAL_COMMUNITY): Payer: 59 | Admitting: Licensed Clinical Social Worker

## 2020-02-18 ENCOUNTER — Encounter (HOSPITAL_COMMUNITY): Payer: Self-pay

## 2020-02-18 ENCOUNTER — Other Ambulatory Visit (HOSPITAL_COMMUNITY): Payer: 59 | Admitting: Occupational Therapy

## 2020-02-18 ENCOUNTER — Other Ambulatory Visit: Payer: Self-pay

## 2020-02-18 DIAGNOSIS — F411 Generalized anxiety disorder: Secondary | ICD-10-CM

## 2020-02-18 DIAGNOSIS — F332 Major depressive disorder, recurrent severe without psychotic features: Secondary | ICD-10-CM

## 2020-02-18 DIAGNOSIS — R41844 Frontal lobe and executive function deficit: Secondary | ICD-10-CM

## 2020-02-18 DIAGNOSIS — R4589 Other symptoms and signs involving emotional state: Secondary | ICD-10-CM

## 2020-02-18 NOTE — Therapy (Signed)
[place], [place], [address] Phone: [phone]   Fax:  [phone] Virtual Visit via Video Note  I connected with Ashley Stephens on 02/18/20 at  11:00 AM EDT by a video enabled telemedicine application and verified that I am speaking with the correct person using two identifiers.   I discussed the limitations of evaluation and management by telemedicine and the availability of in person appointments. The patient expressed understanding and agreed to proceed.  I discussed the assessment and treatment plan with the patient. The patient was provided an opportunity to ask questions and all were answered. The patient agreed with the plan and demonstrated an understanding of the instructions.   The patient was advised to call back or seek an in-person evaluation if the symptoms worsen or if the condition fails to improve as anticipated.  I provided 60 minutes of non-face-to-face time during this encounter.    Occupational Therapy Treatment  Patient Details  Name: Ashley Stephens MRN: [id number] Date of Birth: [birth year] Referring Provider (OT): Ricky Ala   Encounter Date: 02/18/2020   OT End of Session - 02/18/20 1315    Visit Number 11    Number of Visits 20    Date for OT Re-Evaluation 03/01/20    Authorization Type [employer]    Authorization - Number of Visits 60    OT Start Time 1110    OT Stop Time 1210    OT Time Calculation (min) 60 min    Activity Tolerance Patient tolerated treatment well    Behavior During Therapy WFL for tasks assessed/performed           Past Medical History:  Diagnosis Date  . Anxiety   . Depression   . Gastric ulcer   . GERD (gastroesophageal reflux disease)   . Hypertension   . Osteoarthritis   . Thyroid disorder     Past Surgical History:  Procedure Laterality Date  . ABDOMINAL HYSTERECTOMY  2013  . BARIATRIC SURGERY    . BREAST BIOPSY   1995  . GASTRIC BYPASS  2012  . TUBAL LIGATION  1996    There were no vitals filed for this visit.   Subjective Assessment - 02/18/20 1314    Currently in Pain? No/denies              OT Education - 02/18/20 1314    Education Details Educated on concept of sensory modulation and self-soothing as coping strategies through use of the five senses    Person(s) Educated Patient    Methods Explanation;Handout    Comprehension Verbalized understanding            OT Short Term Goals - 02/03/20 1440      OT SHORT TERM GOAL #1   Status On-going      OT SHORT TERM GOAL #2   Status On-going      OT SHORT TERM GOAL #3   Status On-going      OT SHORT TERM GOAL #4   Status On-going         Group Session:  S:"When I can't fall asleep, I like to rub my feet together against my legs. It's soothing and helps me relax to fall asleep."  O: Today's group session focused on topic of sensory modulation and self-soothing through use of the five senses. Discussion introduced the concept of sensory modulation and integration, focusing on how we can utilize our body and it's senses  to self-soothe or cope, when we are experiencing an over or under-whelming sensation or feeling. Group members were introduced to a sensory diet checklist as a helpful tool/resource that can be utilized to identify what activities and strategies we prefer and do not prefer based upon our response to different stimulus. The concept of alerting vs calming activities was also introduced to understand how to counteract how we are feeling (Example: when we are feeling overwhelmed/stressed, engage in something calming. When we are feeling depressed/low energy, engage in something alerting). Group members engaged actively in discussion sharing their own personal sensory likes/dislikes.    A: Ashley Stephens was active and independent in her participation of discussion and activity. Pt shared that she had never really thought about  her sensory preferences playing a role in her mood, however was able to recognize a variety of examples and situations that she has found to be helpful and unhelpful. Pt identified rubbing her feet together before she goes to bed as a sensory strategy that helps to calm her. She also recognized that when she is anxious, she needs to walk, move around, or fidget with her hands and does not like to sit still. Pt appeared both receptive and engaged in further exploring sensory preferences to identify additional coping strategies to add to her toolbox.    P: Continue to attend PHP OT group sessions 5x week for 1 week to promote daily structure, social engagement, and opportunities to develop and utilize adaptive strategies to maximize functional performance in preparation for safe transition and integration back into school, work, and the community. Plan to address topic of stress management in next OT group session.    Plan - 02/18/20 1315    Occupational performance deficits (Please refer to evaluation for details): ADL's;IADL's;Rest and Sleep;Work;Leisure;Social Participation    Body Structure / Function / Physical Skills ADL    Cognitive Skills Attention;Emotional;Energy/Drive;Problem Solve;Temperament/Personality    Psychosocial Skills Coping Strategies;Interpersonal Interaction;Routines and Behaviors;Habits           Patient will benefit from skilled therapeutic intervention in order to improve the following deficits and impairments:   Body Structure / Function / Physical Skills: ADL Cognitive Skills: Attention, Emotional, Energy/Drive, Problem Solve, Temperament/Personality Psychosocial Skills: Coping Strategies, Interpersonal Interaction, Routines and Behaviors, Habits   Visit Diagnosis: Difficulty coping  Frontal lobe and executive function deficit  Severe episode of recurrent major depressive disorder, without psychotic features [facility])    Problem List Patient Active Problem List    Diagnosis Date Noted  . Major depressive disorder, recurrent episode, severe ([place]) 01/27/2020  . DDD (degenerative disc disease), lumbar 12/16/2019  . B12 deficiency 12/16/2019  . Encounter for general adult medical examination with abnormal findings 07/12/2019  . Borderline diabetes 07/12/2019  . Gastroesophageal reflux disease without esophagitis 07/12/2019  . Encounter for breast cancer screening other than mammogram 07/12/2019  . Dysuria 07/12/2019  . Mild mood disorder ([place]) 06/07/2018  . Generalized anxiety disorder 06/07/2018  . Type 2 diabetes mellitus with hyperglycemia, without long-term current use of insulin ([place]) 12/14/2017  . Impaired fasting glucose 12/14/2017  . Fatigue 12/14/2017  . Abnormal weight gain 12/14/2017  . Vitamin D deficiency 12/14/2017  . Acquired hypothyroidism 12/14/2017    Ponciano Ort, MOT, OTR/L  02/18/2020, 1:15 PM  [place], [place], [address] Phone: [phone]   Fax:  [phone]  Name: Ashley Stephens MRN: [id number] Date of Birth: [birth year]

## 2020-02-19 ENCOUNTER — Ambulatory Visit: Payer: 59 | Admitting: Psychiatry

## 2020-02-19 ENCOUNTER — Other Ambulatory Visit (HOSPITAL_COMMUNITY): Payer: 59 | Admitting: Occupational Therapy

## 2020-02-19 ENCOUNTER — Other Ambulatory Visit (HOSPITAL_COMMUNITY): Payer: 59 | Admitting: Licensed Clinical Social Worker

## 2020-02-19 ENCOUNTER — Other Ambulatory Visit: Payer: Self-pay

## 2020-02-19 ENCOUNTER — Encounter (HOSPITAL_COMMUNITY): Payer: Self-pay

## 2020-02-19 DIAGNOSIS — R4589 Other symptoms and signs involving emotional state: Secondary | ICD-10-CM

## 2020-02-19 DIAGNOSIS — F332 Major depressive disorder, recurrent severe without psychotic features: Secondary | ICD-10-CM

## 2020-02-19 DIAGNOSIS — F411 Generalized anxiety disorder: Secondary | ICD-10-CM

## 2020-02-19 DIAGNOSIS — R41844 Frontal lobe and executive function deficit: Secondary | ICD-10-CM

## 2020-02-19 NOTE — Therapy (Signed)
[place], [place], [address] Phone: [phone]   Fax:  [phone] Virtual Visit via Video Note  I connected with Ashley Stephens on 02/19/20 at  10:00 AM EDT by a video enabled telemedicine application and verified that I am speaking with the correct person using two identifiers.   I discussed the limitations of evaluation and management by telemedicine and the availability of in person appointments. The patient expressed understanding and agreed to proceed.   I discussed the assessment and treatment plan with the patient. The patient was provided an opportunity to ask questions and all were answered. The patient agreed with the plan and demonstrated an understanding of the instructions.   The patient was advised to call back or seek an in-person evaluation if the symptoms worsen or if the condition fails to improve as anticipated.  I provided 65 minutes of non-face-to-face time during this encounter.   Occupational Therapy Treatment  Patient Details  Name: Ashley Stephens MRN: [id number] Date of Birth: 1976-05-04 Referring Provider (OT): Ricky Ala   Encounter Date: 02/19/2020   OT End of Session - 02/19/20 1159    Visit Number 12    Number of Visits 20    Date for OT Re-Evaluation 03/01/20    Authorization Type [employer]et[employer]    Authorization - Number of Visits 60    OT Start Time 1000    OT Stop Time 1105    OT Time Calculation (min) 65 min    Activity Tolerance Patient tolerated treatment well    Behavior During Therapy WFL for tasks assessed/performed           Past Medical History:  Diagnosis Date  . Anxiety   . Depression   . Gastric ulcer   . GERD (gastroesophageal reflux disease)   . Hypertension   . Osteoarthritis   . Thyroid disorder     Past Surgical History:  Procedure Laterality Date  . ABDOMINAL HYSTERECTOMY  2013  . BARIATRIC SURGERY    . BREAST BIOPSY   1995  . GASTRIC BYPASS  2012  . TUBAL LIGATION  1996    There were no vitals filed for this visit.   Subjective Assessment - 02/19/20 1159    Currently in Pain? No/denies              OT Education - 02/19/20 1159    Education Details Educated on physical symptomology of stress and its effects on the body, along with positive stress management tips/strategies    Person(s) Educated Patient    Methods Explanation;Handout    Comprehension Verbalized understanding            OT Short Term Goals - 02/03/20 1440      OT SHORT TERM GOAL #1   Status On-going      OT SHORT TERM GOAL #2   Status On-going      OT SHORT TERM GOAL #3   Status On-going      OT SHORT TERM GOAL #4   Status On-going         Group Session:  S: "To me, stress is full of this worry that you don't have control over."  O: Group began with a check-in and review of previous OT session focused on sensory modulation and self-soothing with members sharing ways in which they coped over the previous afternoon. Today's discussion focused on the topic of stress management. Group members worked collaboratively to create a  stress management flowsheet identifying physical signs, behavioral signs, emotional/psychological, and cognitive signs of stress. Discussion then focused and encouraged group members to identify positive stress management strategies they could utilize in those moments to manage identified signs.   A: Ashley Stephens was active and independent in her participation of discussion and activity. She shared that when she is experiencing stress, it is often precipitated by an intense feeling or worry that she does not have control over. Pt appeared receptive and attentive to discussion focused on identifying healthy stress management strategies, however did not verbally identify a strategy that often works for her. Pt encouraged to challenge her thinking and to pinpoint one sign of stress and focus on a way to  manage that identified sign - appeared receptive.   P: Continue to attend PHP OT group sessions 5x week for 1 week to promote daily structure, social engagement, and opportunities to develop and utilize adaptive strategies to maximize functional performance in preparation for safe transition and integration back into school, work, and the community. Plan to address topic of safety planning in next OT group session.    Plan - 02/19/20 1200    Occupational performance deficits (Please refer to evaluation for details): ADL's;IADL's;Rest and Sleep;Work;Leisure;Social Participation    Body Structure / Function / Physical Skills ADL    Cognitive Skills Attention;Emotional;Energy/Drive;Problem Solve;Temperament/Personality    Psychosocial Skills Coping Strategies;Interpersonal Interaction;Routines and Behaviors;Habits           Patient will benefit from skilled therapeutic intervention in order to improve the following deficits and impairments:   Body Structure / Function / Physical Skills: ADL Cognitive Skills: Attention, Emotional, Energy/Drive, Problem Solve, Temperament/Personality Psychosocial Skills: Coping Strategies, Interpersonal Interaction, Routines and Behaviors, Habits   Visit Diagnosis: Difficulty coping  Frontal lobe and executive function deficit  Severe episode of recurrent major depressive disorder, without psychotic features [facility])    Problem List Patient Active Problem List   Diagnosis Date Noted  . Major depressive disorder, recurrent episode, severe ([place]) 01/27/2020  . DDD (degenerative disc disease), lumbar 12/16/2019  . B12 deficiency 12/16/2019  . Encounter for general adult medical examination with abnormal findings 07/12/2019  . Borderline diabetes 07/12/2019  . Gastroesophageal reflux disease without esophagitis 07/12/2019  . Encounter for breast cancer screening other than mammogram 07/12/2019  . Dysuria 07/12/2019  . Mild mood disorder ([place]) 06/07/2018   . Generalized anxiety disorder 06/07/2018  . Type 2 diabetes mellitus with hyperglycemia, without long-term current use of insulin ([place]) 12/14/2017  . Impaired fasting glucose 12/14/2017  . Fatigue 12/14/2017  . Abnormal weight gain 12/14/2017  . Vitamin D deficiency 12/14/2017  . Acquired hypothyroidism 12/14/2017    Ponciano Ort, MOT, OTR/L  02/19/2020, 12:00 PM  [place], [place], [address] Phone: [phone]   Fax:  [phone]  Name: Ashley Stephens MRN: [id number] Date of Birth: Jun 04, 1976

## 2020-02-21 NOTE — Psych (Signed)
Virtual Visit via Video Note  I connected with Ashley Stephens on 02/09/20 at  9:00 AM EDT by a video enabled telemedicine application and verified that I am speaking with the correct person using two identifiers.   I discussed the limitations of evaluation and management by telemedicine and the availability of in person appointments. The patient expressed understanding and agreed to proceed.  I discussed the assessment and treatment plan with the patient. The patient was provided an opportunity to ask questions and all were answered. The patient agreed with the plan and demonstrated an understanding of the instructions.   The patient was advised to call back or seek an in-person evaluation if the symptoms worsen or if the condition fails to improve as anticipated.  Pt was provided 240 minutes of non-face-to-face time during this encounter.   [name], LCSW    [facility] PHP THERAPIST PROGRESS NOTE  Tyrene Nader [phone]  Session Time: 9:00 - 10:00  Participation Level: Active  Behavioral Response: CasualAlertDepressed  Type of Therapy: Group Therapy  Treatment Goals addressed: Coping  Interventions: CBT, DBT, Supportive and Reframing  Summary: Clinician led check-in regarding current stressors and situation, and review of patient completed daily inventory. Clinician utilized active listening and empathetic response and validated patient emotions. Clinician facilitated processing group on pertinent issues.   Therapist Response:Ashley Stephens is a 44 y.o. female who presents with depression and anxiety symptoms. Patient arrived within time allowed and reports that she is feeling "run down." Patient rates her mood at a 6 on a scale of 1-10 with 10 being great. Pt reports she did not sleep well and was up/down frequently. Pt states her afternoon was "blah" and she wallowed and spent most of the afternoon in bed. Pt struggles with emotional regulation.  Pt engaged in discussion.       Session Time: 10:00-11:00  Participation Level:Active  Behavioral Response:CasualAlertDepressed  Type of Therapy:Group Therapy  Treatment Goals addressed: Coping  Interventions:CBT, DBT, Solution Focused, Supportive and Reframing  Summary: Cln led discussion on struggles within romantic relationships. Group members shared issues they are having in romantic relationships and the way it is affecting them. Cln synthesized tenets of boundaries, self-esteem, and communication within discussion.   Therapist Response: Pt engaged in discussion and reports struggle with knowing how she feels. Pt able to process and provide and receive support.        Session Time: 11:00- 12:00  Participation Level:Active  Behavioral Response:CasualAlertDepressed  Type of Therapy: Group Therapy, OT  Treatment Goals addressed: Coping  Interventions:Psychosocial skills training, Supportive  Summary:Occupational Therapy group  Therapist Response:Patient engaged in group. See OT note.         Session Time: 12:00 -1:00  Participation Level:Active  Behavioral Response:CasualAlertDepressed  Type of Therapy:Group therapy  Treatment Goals addressed: Coping  Interventions:CBT; Solution focused; Supportive; Reframing  Summary:12:00 - 12:50: Cln introduced topic of cognitive distortions. Cln brought in CBT and the cognitive triangle to inform discussion.  Cln utilized handout "Cognitive Distortions" to discuss common distorted thought patterns. Group members discussed examples of distortions from their own life and the way each distortion affects them.  12:50 -1:00 Clinician led check-out. Clinician assessed for immediate needs, medication compliance and efficacy, and safety concerns  Therapist Response:12:00 - 12:50: Pt engaged in discussion and identifies mind reading and personalization as problematic for  her. 12:50 - 1:00: At check-out, patientrates hermood at a6.5 on a scale of 1-10 with 10 being great.Pt states afternoon plans of working on  paperwork and recharging.Patient demonstratessomeprogress as evidenced by increased awareness.Patient denies SI/HI/self-harm at the end of group.    Suicidal/Homicidal: Nowithout intent/plan  Plan: Pt will continue in PHP while working to decrease depression and anxiety symptoms, increase ability to manage symptoms in a healthy manner, and increase ability to regulate emotions.   Diagnosis: Severe episode of recurrent major depressive disorder, without psychotic features ([place]) [F33.2]    1. Severe episode of recurrent major depressive disorder, without psychotic features ([place])   2. Mood disorder ([place])   3. Generalized anxiety disorder       [name], [place] 02/21/2020

## 2020-02-21 NOTE — Psych (Signed)
Virtual Visit via Video Note  I connected with Ashley Land on 02/10/20 at  9:00 AM EDT by a video enabled telemedicine application and verified that I am speaking with the correct person using two identifiers.   I discussed the limitations of evaluation and management by telemedicine and the availability of in person appointments. The patient expressed understanding and agreed to proceed.  I discussed the assessment and treatment plan with the patient. The patient was provided an opportunity to ask questions and all were answered. The patient agreed with the plan and demonstrated an understanding of the instructions.   The patient was advised to call back or seek an in-person evaluation if the symptoms worsen or if the condition fails to improve as anticipated.  Pt was provided 240 minutes of non-face-to-face time during this encounter.   [name], LCSW    [facility] PHP THERAPIST PROGRESS NOTE  Ashley Stephens [phone]  Session Time: 9:00 - 10:00  Participation Level: Active  Behavioral Response: CasualAlertDepressed  Type of Therapy: Group Therapy  Treatment Goals addressed: Coping  Interventions: CBT, DBT, Supportive and Reframing  Summary: Clinician led check-in regarding current stressors and situation, and review of patient completed daily inventory. Clinician utilized active listening and empathetic response and validated patient emotions. Clinician facilitated processing group on pertinent issues.   Therapist Response:Ashley Stephens is a 44 y.o. female who presents with depression and anxiety symptoms. Patient arrived within time allowed and reports that she is feeling "blah." Patient rates her mood at a 5 on a scale of 1-10 with 10 being great. Pt reports she was "frustrated" yesterday and continues to struggle with completing paperwork for her job. Pt struggles with "shoulds." Pt engaged in discussion.       Session Time:  10:00-11:00  Participation Level:Active  Behavioral Response:CasualAlertDepressed  Type of Therapy:Group Therapy  Treatment Goals addressed: Coping  Interventions:CBT, DBT, Solution Focused, Supportive and Reframing  Summary: Cln led discussion on manipulative behaviors and how to identify them. Group members shared situations in which they have felt manipulated and taken advantage of. Cln encouraged pt's to pay attention to people's actions, not only their words, to increase likeliness that feelings are not misleading [place].   Therapist Response: Pt engaged in discussion and shares ways manipulation has hurt her.      Session Time: 11:00 -12:00   Participation Level: Active   Behavioral Response: CasualAlertDepressed   Type of Therapy: Group Therapy, psychotherapy   Treatment Goals addressed: Coping   Interventions: Strengths based, reframing, Supportive,    Summary:  Spiritual Care group   Therapist Response: Patient engaged in group. See chaplain note.      Session Time: 12:00 -1:00  Participation Level:Active  Behavioral Response:CasualAlertDepressed  Type of Therapy:Group therapy  Treatment Goals addressed: Coping  Interventions:Psychosocial skills training, Supportive  Summary:12:00 - 12:50: Occupational Therapy group 12:50 -1:00 Clinician led check-out. Clinician assessed for immediate needs, medication compliance and efficacy, and safety concerns  Therapist Response:Patient engaged in group. See OT note.  12:50 - 1:00: At check-out, patientrates hermood at a7.5 on a scale of 1-10 with 10 being great.Pt states afternoon plans of relaxing.Patient demonstratessomeprogress as evidenced by continued openness.Patient denies SI/HI/self-harm at the end of group.    Suicidal/Homicidal: Nowithout intent/plan  Plan: Pt will continue in PHP while working to decrease depression and anxiety symptoms, increase ability to manage  symptoms in a healthy manner, and increase ability to regulate emotions.   Diagnosis: Severe episode of recurrent major depressive  disorder, without psychotic features ([place]) [F33.2]    1. Severe episode of recurrent major depressive disorder, without psychotic features ([place])   2. Generalized anxiety disorder       [name], [place] 02/21/2020

## 2020-02-22 ENCOUNTER — Encounter (HOSPITAL_COMMUNITY): Payer: Self-pay

## 2020-02-22 ENCOUNTER — Other Ambulatory Visit (HOSPITAL_COMMUNITY): Payer: 59 | Admitting: Occupational Therapy

## 2020-02-22 ENCOUNTER — Other Ambulatory Visit: Payer: Self-pay

## 2020-02-22 ENCOUNTER — Other Ambulatory Visit (HOSPITAL_COMMUNITY): Payer: 59 | Admitting: Licensed Clinical Social Worker

## 2020-02-22 DIAGNOSIS — F332 Major depressive disorder, recurrent severe without psychotic features: Secondary | ICD-10-CM

## 2020-02-22 DIAGNOSIS — R4589 Other symptoms and signs involving emotional state: Secondary | ICD-10-CM

## 2020-02-22 DIAGNOSIS — F411 Generalized anxiety disorder: Secondary | ICD-10-CM

## 2020-02-22 DIAGNOSIS — R41844 Frontal lobe and executive function deficit: Secondary | ICD-10-CM

## 2020-02-22 NOTE — Therapy (Signed)
Ashley Stephens, [place], [address] Phone: [phone]   Fax:  [phone] Virtual Visit via Video Note  I connected with Ashley Stephens on 02/22/20 at  11:00 AM EDT by a video enabled telemedicine application and verified that I am speaking with the correct person using two identifiers.   I discussed the limitations of evaluation and management by telemedicine and the availability of in person appointments. The patient expressed understanding and agreed to proceed.    I discussed the assessment and treatment plan with the patient. The patient was provided an opportunity to ask questions and all were answered. The patient agreed with the plan and demonstrated an understanding of the instructions.   The patient was advised to call back or seek an in-person evaluation if the symptoms worsen or if the condition fails to improve as anticipated.  I provided 50 minutes of non-face-to-face time during this encounter.   Occupational Therapy Treatment  Patient Details  Name: Ashley Stephens MRN: [id number] Date of Birth: November 20, 1975 Referring Provider (OT): Ricky Ala   Encounter Date: 02/22/2020   OT End of Session - 02/22/20 1212    Visit Number 13    Number of Visits 20    Date for OT Re-Evaluation 03/01/20    Authorization Type [employer]    Authorization - Number of Visits 60    OT Start Time 1110    OT Stop Time 1200    OT Time Calculation (min) 50 min    Activity Tolerance Patient tolerated treatment well    Behavior During Therapy WFL for tasks assessed/performed           Past Medical History:  Diagnosis Date  . Anxiety   . Depression   . Gastric ulcer   . GERD (gastroesophageal reflux disease)   . Hypertension   . Osteoarthritis   . Thyroid disorder     Past Surgical History:  Procedure Laterality Date  . ABDOMINAL HYSTERECTOMY  2013  . BARIATRIC SURGERY    . BREAST BIOPSY   1995  . GASTRIC BYPASS  2012  . TUBAL LIGATION  1996    There were no vitals filed for this visit.   Subjective Assessment - 02/22/20 1212    Currently in Pain? No/denies                 OT Education - 02/22/20 1212    Education Details Educated on identifying coping strategies, social supports, and community mental health resources available through use of safety planning tool    Person(s) Educated Patient    Methods Explanation;Handout    Comprehension Verbalized understanding            OT Short Term Goals - 02/03/20 1440      OT SHORT TERM GOAL #1   Status On-going      OT SHORT TERM GOAL #2   Status On-going      OT SHORT TERM GOAL #3   Status On-going      OT SHORT TERM GOAL #4   Status On-going         Group Session:  S: "I like to go shopping and just look, not even buy anything."  O: Today's group discussion focused on the topic of Safety Planning. Patients were educated on what a safety plan is, what it can be used for, and why we should create one. Group then worked collaboratively and independently to create an individualized safety  plan including identifying warning signs, coping strategies, places to go for distraction, social supports, professional supports, and how to make the environment safe. Group members were also encouraged to reflect on the question "What is life worth living for?" Group session ended with patients encouraged to utilize their safety plan as an all-inclusive resource when experiencing a mental health crisis.    A: [name] was active and independent in her participation of discussion and activity. She shared that she likes to go to the store and go shopping, sometimes without buying anything, as a place to go for distraction. Pt identified "getting angry or upset about literally everything" as a warning sign that she is decompensating in her mental health, noting further "It doesn't matter what it is, but I'll get upset, because  at the moment, everything is upsetting to me." Appeared further receptive to information and feedback provided on utilizing safety plan as a resource in the future.  P: Continue to attend PHP OT group sessions 5x week for 1 week to promote daily structure, social engagement, and opportunities to develop and utilize adaptive strategies to maximize functional performance in preparation for safe transition and integration back into school, work, and the community. Plan to address topic of accountability in next OT group session.     Plan - 02/22/20 1212    Occupational performance deficits (Please refer to evaluation for details): ADL's;IADL's;Rest and Sleep;Work;Leisure;Social Participation    Body Structure / Function / Physical Skills ADL    Cognitive Skills Attention;Emotional;Energy/Drive;Problem Solve;Temperament/Personality    Psychosocial Skills Coping Strategies;Interpersonal Interaction;Routines and Behaviors;Habits           Patient will benefit from skilled therapeutic intervention in order to improve the following deficits and impairments:   Body Structure / Function / Physical Skills: ADL Cognitive Skills: Attention, Emotional, Energy/Drive, Problem Solve, Temperament/Personality Psychosocial Skills: Coping Strategies, Interpersonal Interaction, Routines and Behaviors, Habits   Visit Diagnosis: Difficulty coping  Frontal lobe and executive function deficit  Severe episode of recurrent major depressive disorder, without psychotic features [facility])    Problem List Patient Active Problem List   Diagnosis Date Noted  . Major depressive disorder, recurrent episode, severe ([place]) 01/27/2020  . DDD (degenerative disc disease), lumbar 12/16/2019  . B12 deficiency 12/16/2019  . Encounter for general adult medical examination with abnormal findings 07/12/2019  . Borderline diabetes 07/12/2019  . Gastroesophageal reflux disease without esophagitis 07/12/2019  . Encounter for  breast cancer screening other than mammogram 07/12/2019  . Dysuria 07/12/2019  . Mild mood disorder ([place]) 06/07/2018  . Generalized anxiety disorder 06/07/2018  . Type 2 diabetes mellitus with hyperglycemia, without long-term current use of insulin ([place]) 12/14/2017  . Impaired fasting glucose 12/14/2017  . Fatigue 12/14/2017  . Abnormal weight gain 12/14/2017  . Vitamin D deficiency 12/14/2017  . Acquired hypothyroidism 12/14/2017    Ponciano Ort, MOT, OTR/L  02/22/2020, 12:13 PM  [address] [place], [place], [address] Phone: [phone]   Fax:  [phone]  Name: Lizzie An MRN: [id number] Date of Birth: 03-02-1976

## 2020-02-22 NOTE — Progress Notes (Signed)
Spoke with patient via Webex video call, used 2 identifiers to correctly identify patient. States that groups are going well, was having some issues with her return to work paperwork. They were getting the dates mixed up and causing some confusion for her. Denies SI/HI or AV hallucinations. On scale 1-10 as 10 being worst she rates depression at 6 and anxiety at 5. No other issues or complaints. No side effects from medications.

## 2020-02-23 ENCOUNTER — Other Ambulatory Visit (HOSPITAL_COMMUNITY): Payer: 59 | Admitting: Licensed Clinical Social Worker

## 2020-02-23 ENCOUNTER — Other Ambulatory Visit: Payer: Self-pay

## 2020-02-23 ENCOUNTER — Encounter (HOSPITAL_COMMUNITY): Payer: Self-pay

## 2020-02-23 ENCOUNTER — Other Ambulatory Visit (HOSPITAL_COMMUNITY): Payer: 59 | Admitting: Occupational Therapy

## 2020-02-23 DIAGNOSIS — R41844 Frontal lobe and executive function deficit: Secondary | ICD-10-CM

## 2020-02-23 DIAGNOSIS — R4589 Other symptoms and signs involving emotional state: Secondary | ICD-10-CM

## 2020-02-23 DIAGNOSIS — F411 Generalized anxiety disorder: Secondary | ICD-10-CM

## 2020-02-23 DIAGNOSIS — F332 Major depressive disorder, recurrent severe without psychotic features: Secondary | ICD-10-CM

## 2020-02-23 NOTE — Psych (Signed)
Virtual Visit via Video Note  I connected with [name] on 02/11/20 at  9:00 AM EDT by a video enabled telemedicine application and verified that I am speaking with the correct person using two identifiers.   I discussed the limitations of evaluation and management by telemedicine and the availability of in person appointments. The patient expressed understanding and agreed to proceed.  Location: Patient: Patient Home Provider: Clinical Home Office  I discussed the assessment and treatment plan with the patient. The patient was provided an opportunity to ask questions and all were answered. The patient agreed with the plan and demonstrated an understanding of the instructions.   The patient was advised to call back or seek an in-person evaluation if the symptoms worsen or if the condition fails to improve as anticipated.  Pt was provided 240 minutes of non-face-to-face time during this encounter.   [name], LCSW    [facility] PHP THERAPIST PROGRESS NOTE  Nastacia Raybuck [id number]  Session Time: 9:00 - 10:00  Participation Level: Active  Behavioral Response: CasualAlertDepressed  Type of Therapy: Group Therapy  Treatment Goals addressed: Coping  Interventions: CBT, DBT, Supportive and Reframing  Summary: Clinician led check-in regarding current stressors and situation, and review of patient completed daily inventory. Clinician utilized active listening and empathetic response and validated patient emotions. Clinician facilitated processing group on pertinent issues.   Therapist Response:Jaselynn Shaneca Orne is a 44 y.o. female who presents with depression and anxiety symptoms. Patient arrived within time allowed and reports that she is feeling "annoyed." Patient rates her mood at a 3.5 on a scale of 1-10 with 10 being great. Pt reports she feels "over it" regarding organizing her leave from work. Pt struggles with feeling she cannot make changes.  Pt is able to process. Pt engaged in discussion.       Session Time: 10:00-11:00  Participation Level:Active  Behavioral Response:CasualAlertDepressed  Type of Therapy:Group Therapy  Treatment Goals addressed: Coping  Interventions:CBT, DBT, Solution Focused, Supportive and Reframing  Summary: Cln led discussion on distractions. Cln utilized DBT distress tolerance skills to inform discussion and discussed how distractions aim to pull our thoughts away from a situation when we are emotionally escalated. Group brainstormed ways they could distract themselves when experiencing strong emotion.   Therapist Response:  Pt engaged in discussion and shares ways they struggle to manage their feelings. Pt identifies shopping as distractions she can try.       Session Time: 11:00- 12:00  Participation Level:Active  Behavioral Response:CasualAlertDepressed  Type of Therapy: Group Therapy, OT  Treatment Goals addressed: Coping  Interventions:Psychosocial skills training, Supportive  Summary:Occupational Therapy group  Therapist Response:Patient engaged in group. See OT note.         Session Time: 12:00 -1:00  Participation Level:Active  Behavioral Response:CasualAlertDepressed  Type of Therapy:Group therapy  Treatment Goals addressed: Coping  Interventions:CBT; Solution focused; Supportive; Reframing  Summary:12:00 - 12:50: Cln continued topic of cognitive distortions. Cln utilized handout "Unhealthy Thought Patterns" to discuss further examples of distorted thought patterns. Group members discussed examples of distortions from their own life and the way it affects them.   12:50 -1:00 Clinician led check-out. Clinician assessed for immediate needs, medication compliance and efficacy, and safety concerns  Therapist Response:12:00 - 12:50: Pt engaged in discussion and identifies struggling with false permanence.  12:50 -  1:00: At check-out, patientrates hermood at a6.5 on a scale of 1-10 with 10 being great.Pt states no specific afternoon plans.Patient demonstratessomeprogress as evidenced by coming  to group when she didn't want to.Patient denies SI/HI/self-harm at the end of group.    Suicidal/Homicidal: Nowithout intent/plan  Plan: Pt will continue in PHP while working to decrease depression and anxiety symptoms, increase ability to manage symptoms in a healthy manner, and increase ability to regulate emotions.   Diagnosis: Severe episode of recurrent major depressive disorder, without psychotic features ([place]) [F33.2]    1. Severe episode of recurrent major depressive disorder, without psychotic features ([place])   2. Generalized anxiety disorder       [name], [place] 02/23/2020

## 2020-02-23 NOTE — Psych (Signed)
Virtual Visit via Video Note  I connected with Ashley Stephens on 02/12/20 at  9:00 AM EDT by a video enabled telemedicine application and verified that I am speaking with the correct person using two identifiers.   I discussed the limitations of evaluation and management by telemedicine and the availability of in person appointments. The patient expressed understanding and agreed to proceed.  Location: Patient: Patient Home Provider: Clinical Home Office  I discussed the assessment and treatment plan with the patient. The patient was provided an opportunity to ask questions and all were answered. The patient agreed with the plan and demonstrated an understanding of the instructions.   The patient was advised to call back or seek an in-person evaluation if the symptoms worsen or if the condition fails to improve as anticipated.  Pt was provided 240 minutes of non-face-to-face time during this encounter.   [name], LCSW    [facility] PHP THERAPIST PROGRESS NOTE  Ashley Stephens [id number]  Session Time: 9:00 - 10:00  Participation Level: Active  Behavioral Response: CasualAlertDepressed  Type of Therapy: Group Therapy  Treatment Goals addressed: Coping  Interventions: CBT, DBT, Supportive and Reframing  Summary: Clinician led check-in regarding current stressors and situation, and review of patient completed daily inventory. Clinician utilized active listening and empathetic response and validated patient emotions. Clinician facilitated processing group on pertinent issues.   Therapist Response:Ashley Stephens is a 44 y.o. female who presents with depression and anxiety symptoms. Patient arrived within time allowed and reports that she is feeling "fine." Patient rates her mood at a 7 on a scale of 1-10 with 10 being great. Pt reports yesterday was "okay" and she took a nap and cleaned out her car. Pt reports she struggled with sleep and continues to  be up/down throughout the night. Pt is able to process. Pt engaged in discussion.       Session Time: 10:00-11:00  Participation Level:Active  Behavioral Response:CasualAlertDepressed  Type of Therapy:Group Therapy  Treatment Goals addressed: Coping  Interventions:CBT, DBT, Solution Focused, Supportive and Reframing  Summary: Cln continued topic of cognitive distortions and introduced ways to "challenge" identified distorted thoughts through use of CBT strategies. Cln discussed "checking the facts" and other was to access logic when in the midst of irrational thoughts. Group members shared ways they have thought challenged in the past and how it can help them moving forward.   Therapist Response:  Pt engaged in discussion and reports understanding of thought challenging.       Session Time: 11:00- 12:00  Participation Level:Active  Behavioral Response:CasualAlertDepressed  Type of Therapy: Group Therapy, OT  Treatment Goals addressed: Coping  Interventions:Psychosocial skills training, Supportive  Summary:Occupational Therapy group  Therapist Response:Patient engaged in group. See OT note.         Session Time: 12:00 -1:00  Participation Level:Active  Behavioral Response:CasualAlertDepressed  Type of Therapy:Group therapy  Treatment Goals addressed: Coping  Interventions:CBT; Solution focused; Supportive; Reframing  Summary:12:00 - 12:50: Cln continued topic of thought challenging and group practiced utilizing this skill. Cln showed various clips from tv/movies and group worked to identify the distorted thoughts and challenge them.  12:50 -1:00 Clinician led check-out. Clinician assessed for immediate needs, medication compliance and efficacy, and safety concerns  Therapist Response:12:00 - 12:50: Pt engaged in discussion and activity. Pt demonstrates ability to utilize thought challenging skill through the  activity and reports increased confidence in practicing this in their every day life.  12:50 - 1:00: At check-out, patientrates  hermood at a6.5 on a scale of 1-10 with 10 being great.Pt states afternoon plans of getting her nails done.Patient demonstratessomeprogress as evidenced by attempting distractions to manage mood.Patient denies SI/HI/self-harm at the end of group.    Suicidal/Homicidal: Nowithout intent/plan  Plan: Pt will continue in PHP while working to decrease depression and anxiety symptoms, increase ability to manage symptoms in a healthy manner, and increase ability to regulate emotions.   Diagnosis: Severe episode of recurrent major depressive disorder, without psychotic features ([place]) [F33.2]    1. Severe episode of recurrent major depressive disorder, without psychotic features ([place])   2. Generalized anxiety disorder       [name], [place] 02/23/2020

## 2020-02-23 NOTE — Therapy (Signed)
[address] [place], [place], [address] Phone: [phone]   Fax:  [phone] Virtual Visit via Video Note  I connected with Ashley Stephens on 02/23/20 at  11:00 AM EDT by a video enabled telemedicine application and verified that I am speaking with the correct person using two identifiers.   I discussed the limitations of evaluation and management by telemedicine and the availability of in person appointments. The patient expressed understanding and agreed to proceed.   I discussed the assessment and treatment plan with the patient. The patient was provided an opportunity to ask questions and all were answered. The patient agreed with the plan and demonstrated an understanding of the instructions.   The patient was advised to call back or seek an in-person evaluation if the symptoms worsen or if the condition fails to improve as anticipated.  I provided 60 minutes of non-face-to-face time during this encounter.   Occupational Therapy Treatment  Patient Details  Name: Ashley Stephens MRN: [id number] Date of Birth: 1975-11-02 Referring Provider (OT): Ricky Ala   Encounter Date: 02/23/2020   OT End of Session - 02/23/20 1309    Visit Number 14    Number of Visits 20    Date for OT Re-Evaluation 03/01/20    Authorization Type Aetna    Authorization - Number of Visits 60    OT Start Time 1115    OT Stop Time 1215    OT Time Calculation (min) 60 min    Activity Tolerance Patient tolerated treatment well    Behavior During Therapy [facility] for tasks assessed/performed           Past Medical History:  Diagnosis Date  . Anxiety   . Depression   . Gastric ulcer   . GERD (gastroesophageal reflux disease)   . Hypertension   . Osteoarthritis   . Thyroid disorder     Past Surgical History:  Procedure Laterality Date  . ABDOMINAL HYSTERECTOMY  2013  . BARIATRIC SURGERY    . BREAST BIOPSY   1995  . GASTRIC BYPASS  2012  . TUBAL LIGATION  1996    There were no vitals filed for this visit.   Subjective Assessment - 02/23/20 1309    Currently in Pain? No/denies             OT Education - 02/23/20 1309    Education Details Educated on difference between accountability and responsibility, looking specifically at self-accountability. Circle of control was utilized as a resource/tool to recognize what we can and cannot control.    Person(s) Educated Patient    Methods Explanation;Handout    Comprehension Verbalized understanding            OT Short Term Goals - 02/03/20 1440      OT SHORT TERM GOAL #1   Status On-going      OT SHORT TERM GOAL #2   Status On-going      OT SHORT TERM GOAL #3   Status On-going      OT SHORT TERM GOAL #4   Status On-going         Group Session:  S: "I think I hold myself accountable for all my actions, but I take on the accountability of other people's actions as well when I know I am not responsible."  O: Today's group discussion focused on the topic of accountability vs. responsibility. Discussion identified the differences between being accountable versus responsible and looked  at specific situations in our daily lives. Group members also discussed the things we can and cannot control, specifically looking at our feelings, behaviors, actions, and reactions. The circle of control was also brought up as a tool to recognize again, those thoughts, feelings, actions, behaviors, etc that we can and cannot control.   A: Ashley Stephens was active and independent in her participation of discussion and shared that she holds herself accountable for all of her actions. She also shared that she takes responsibility for other people's lack of accountability, however recognized that this is something that she does not have control over. Initially, Ashley Stephens reported that she thought she could control how she feels, however after discussion noted that she  cannot control how she feels, rather how she reacts to that feeling. Pt appeared receptive to further education and clarification in recognizing what she can and cannot control.   P: Continue to attend PHP OT group sessions 5x week for 1 week to promote daily structure, social engagement, and opportunities to develop and utilize adaptive strategies to maximize functional performance in preparation for safe transition and integration back into school, work, and the community. Plan to address topic of self-accountability in our mental health in next OT group session.     Plan - 02/23/20 1309    Occupational performance deficits (Please refer to evaluation for details): ADL's;IADL's;Rest and Sleep;Work;Leisure;Social Participation    Body Structure / Function / Physical Skills ADL    Cognitive Skills Attention;Emotional;Energy/Drive;Problem Solve;Temperament/Personality    Psychosocial Skills Coping Strategies;Interpersonal Interaction;Routines and Behaviors;Habits           Patient will benefit from skilled therapeutic intervention in order to improve the following deficits and impairments:   Body Structure / Function / Physical Skills: ADL Cognitive Skills: Attention, Emotional, Energy/Drive, Problem Solve, Temperament/Personality Psychosocial Skills: Coping Strategies, Interpersonal Interaction, Routines and Behaviors, Habits   Visit Diagnosis: Difficulty coping  Frontal lobe and executive function deficit  Severe episode of recurrent major depressive disorder, without psychotic features [facility])    Problem List Patient Active Problem List   Diagnosis Date Noted  . Major depressive disorder, recurrent episode, severe ([place]) 01/27/2020  . DDD (degenerative disc disease), lumbar 12/16/2019  . B12 deficiency 12/16/2019  . Encounter for general adult medical examination with abnormal findings 07/12/2019  . Borderline diabetes 07/12/2019  . Gastroesophageal reflux disease without  esophagitis 07/12/2019  . Encounter for breast cancer screening other than mammogram 07/12/2019  . Dysuria 07/12/2019  . Mild mood disorder ([place]) 06/07/2018  . Generalized anxiety disorder 06/07/2018  . Type 2 diabetes mellitus with hyperglycemia, without long-term current use of insulin ([place]) 12/14/2017  . Impaired fasting glucose 12/14/2017  . Fatigue 12/14/2017  . Abnormal weight gain 12/14/2017  . Vitamin D deficiency 12/14/2017  . Acquired hypothyroidism 12/14/2017   Ponciano Ort, MOT, OTR/L  02/23/2020, 1:10 PM  [facility] HOSPITALIZATION PROGRAM [place], [place], [address] Phone: [phone]   Fax:  [phone]  Name: Ashley Stephens MRN: [id number] Date of Birth: 1975-11-08

## 2020-02-24 ENCOUNTER — Other Ambulatory Visit (HOSPITAL_COMMUNITY): Payer: 59 | Admitting: Occupational Therapy

## 2020-02-24 ENCOUNTER — Encounter (HOSPITAL_COMMUNITY): Payer: Self-pay | Admitting: Family

## 2020-02-24 ENCOUNTER — Other Ambulatory Visit: Payer: Self-pay

## 2020-02-24 ENCOUNTER — Encounter (HOSPITAL_COMMUNITY): Payer: Self-pay

## 2020-02-24 ENCOUNTER — Other Ambulatory Visit (HOSPITAL_COMMUNITY): Payer: 59 | Admitting: Licensed Clinical Social Worker

## 2020-02-24 DIAGNOSIS — F332 Major depressive disorder, recurrent severe without psychotic features: Secondary | ICD-10-CM

## 2020-02-24 DIAGNOSIS — F411 Generalized anxiety disorder: Secondary | ICD-10-CM

## 2020-02-24 DIAGNOSIS — R41844 Frontal lobe and executive function deficit: Secondary | ICD-10-CM

## 2020-02-24 DIAGNOSIS — R4589 Other symptoms and signs involving emotional state: Secondary | ICD-10-CM

## 2020-02-24 NOTE — Psych (Signed)
Virtual Visit via Video Note  I connected with Ashley Stephens on 02/19/20 at  9:00 AM EDT by a video enabled telemedicine application and verified that I am speaking with the correct person using two identifiers.   I discussed the limitations of evaluation and management by telemedicine and the availability of in person appointments. The patient expressed understanding and agreed to proceed.  Location: Patient: Patient Home Provider: Clinical Home Office  I discussed the assessment and treatment plan with the patient. The patient was provided an opportunity to ask questions and all were answered. The patient agreed with the plan and demonstrated an understanding of the instructions.   The patient was advised to call back or seek an in-person evaluation if the symptoms worsen or if the condition fails to improve as anticipated.  Pt was provided 240 minutes of non-face-to-face time during this encounter.   [name], LCSW    [facility] PHP THERAPIST PROGRESS NOTE  Chrislynn Mosely [id number]  Session Time: 9:00 - 10:00  Participation Level: Active  Behavioral Response: CasualAlertDepressed  Type of Therapy: Group Therapy  Treatment Goals addressed: Coping  Interventions: CBT, DBT, Supportive and Reframing  Summary: Clinician led check-in regarding current stressors and situation, and review of patient completed daily inventory. Clinician utilized active listening and empathetic response and validated patient emotions. Clinician facilitated processing group on pertinent issues.   Therapist Response:Letricia Kennidee Heyne is a 44 y.o. female who presents with depression and anxiety symptoms. Patient arrived within time allowed and reports that she is feeling "neutral, in the middle zone." Patient rates her mood at a 6 on a scale of 1-10 with 10 being great. Pt reports cleaning her house despite lacking motivation yesterday. Pt reports finding momentum once  started cleaning. Pt engaged in discussion.     Session Time: 10:00-11:00  Participation Level:Active  Behavioral Response:CasualAlertDepressed  Type of Therapy: Group Therapy, OT  Treatment Goals addressed: Coping  Interventions:Psychosocial skills training, Supportive  Summary:Occupational Therapy group  Therapist Response:Patient engaged in group. See OT note.      Session Time: 11:00- 12:00  Participation Level:Active  Behavioral Response:CasualAlertDepressed  Type of Therapy:Group Therapy  Treatment Goals addressed: Coping  Interventions:CBT, DBT, Solution Focused, Supportive and Reframing  Summary: Cln led discussion on stoicism and the way it can be applied to increase mental health. Cln introduced stoicism as a philosophy centered around separating what you can and cannot control and focusing on what you can control. Group members discussed this idea and how it connected with them.    Therapist Response:Pt engaged in discussion and reports this is a difficult concept for her. Pt able to process and discuss how stoicism can be applied in their life.         Session Time: 12:00 -1:00  Participation Level:Active  Behavioral Response:CasualAlertDepressed  Type of Therapy:Group therapy  Treatment Goals addressed: Coping  Interventions:CBT; Solution focused; Supportive; Reframing  Summary:12:00 - 12:50:Cln led discussion on ways to use fears to help make a decision. Group viewed TED talk "Why you should define your fears instead of your goals" and discussed the decision making model discussed in the talk. Group worked through an example using the model and discussed how it can be applied in their lives.  12:50 -1:00 Clinician led check-out. Clinician assessed for immediate needs, medication compliance and efficacy, and safety concerns  Therapist Response:12:00 - 12:50: Pt engaged in discussion and reports  willingness to try decision making model discussed.   12:50 - 1:00: At  check-out, patientrates hermood at a6.5 on a scale of 1-10 with 10 being great.Pt states afternoon plans of running errands.Patient demonstratessomeprogress as evidenced by increased mood.Patient denies SI/HI/self-harm at the end of group.    Suicidal/Homicidal: Nowithout intent/plan  Plan: Pt will continue in PHP while working to decrease depression and anxiety symptoms, increase ability to manage symptoms in a healthy manner, and increase ability to regulate emotions.   Diagnosis: Severe episode of recurrent major depressive disorder, without psychotic features ([place]) [F33.2]    1. Severe episode of recurrent major depressive disorder, without psychotic features ([place])   2. Generalized anxiety disorder       [name], [place] 02/24/2020

## 2020-02-24 NOTE — Psych (Signed)
Virtual Visit via Video Note  I connected with Ashley Stephens on 02/18/20 at  9:00 AM EDT by a video enabled telemedicine application and verified that I am speaking with the correct person using two identifiers.   I discussed the limitations of evaluation and management by telemedicine and the availability of in person appointments. The patient expressed understanding and agreed to proceed.  Location: Patient: Patient Home Provider: Clinical Home Office  I discussed the assessment and treatment plan with the patient. The patient was provided an opportunity to ask questions and all were answered. The patient agreed with the plan and demonstrated an understanding of the instructions.   The patient was advised to call back or seek an in-person evaluation if the symptoms worsen or if the condition fails to improve as anticipated.  Pt was provided 240 minutes of non-face-to-face time during this encounter.   [name], LCSW    [facility] PHP THERAPIST PROGRESS NOTE  Ashley Stephens [id number]  Session Time: 9:00 - 10:00  Participation Level: Active  Behavioral Response: CasualAlertDepressed  Type of Therapy: Group Therapy  Treatment Goals addressed: Coping  Interventions: CBT, DBT, Supportive and Reframing  Summary: Clinician led check-in regarding current stressors and situation, and review of patient completed daily inventory. Clinician utilized active listening and empathetic response and validated patient emotions. Clinician facilitated processing group on pertinent issues.   Therapist Response:Ashley Stephens is a 44 y.o. female who presents with depression and anxiety symptoms. Patient arrived within time allowed and reports that she is feeling "not great. Patient rates her mood at a 4 on a scale of 1-10 with 10 being great. Pt reports her back pain is flaring up today and is affecting her sleep and mood. Pt states yesterday was "horrible" and she  struggled with getting her car worked on and managing her anger. Pt is able to process. Pt engaged in discussion.       Session Time: 10:00-11:00  Participation Level:Active  Behavioral Response:CasualAlertDepressed  Type of Therapy:Group Therapy  Treatment Goals addressed: Coping  Interventions:CBT, DBT, Solution Focused, Supportive and Reframing  Summary: Cln led discussion on the struggle with transitions in relationships. Group members shared ways in which their relationships are or have changed and growing pains they have experienced. Cln utilized thought challenging, boundaries, feelings, and control to shape conversation.    Therapist Response:Pt engaged in discussion and reports struggles with managing anxieties amidst change. Pt able to process and give and receive support.        Session Time: 11:00- 12:00  Participation Level:Active  Behavioral Response:CasualAlertDepressed  Type of Therapy: Group Therapy, OT  Treatment Goals addressed: Coping  Interventions:Psychosocial skills training, Supportive  Summary:Occupational Therapy group  Therapist Response:Patient engaged in group. See OT note.         Session Time: 12:00 -1:00  Participation Level:Active  Behavioral Response:CasualAlertDepressed  Type of Therapy:Group therapy  Treatment Goals addressed: Coping  Interventions:CBT; Solution focused; Supportive; Reframing  Summary:12:00 - 12:50:Cln continued topic of radical acceptance. Cln reviewed and elaborate on previously discussed aspects of radical acceptance. Cln utilized DBT distress tolerance handout 11 in discussion and group focused on "Why accept reality." Group shares struggles with the concept of raidcal acceptance and how this may look in every day life.  12:50 -1:00 Clinician led check-out. Clinician assessed for immediate needs, medication compliance and efficacy, and safety  concerns  Therapist Response:12:00 - 12:50:  Pt engaged in discussion and reports this is a difficult topic for her and  she is open to trying strategies discussed.  12:50 - 1:00: At check-out, patientrates hermood at a6 on a scale of 1-10 with 10 being great.Pt states afternoon plans of "doing nothing."Patient demonstratessomeprogress as evidenced by increased awareness of distorted thinking.Patient denies SI/HI/self-harm at the end of group.    Suicidal/Homicidal: Nowithout intent/plan  Plan: Pt will continue in PHP while working to decrease depression and anxiety symptoms, increase ability to manage symptoms in a healthy manner, and increase ability to regulate emotions.   Diagnosis: Severe episode of recurrent major depressive disorder, without psychotic features ([place]) [F33.2]    1. Severe episode of recurrent major depressive disorder, without psychotic features ([place])   2. Generalized anxiety disorder       [name],  02/24/2020

## 2020-02-24 NOTE — Progress Notes (Signed)
Virtual Visit via Video Note  I connected with Ashley Stephens on 02/24/20 at  9:00 AM EDT by a video enabled telemedicine application and verified that I am speaking with the correct person using two identifiers.   I discussed the limitations of evaluation and management by telemedicine and the availability of in person appointments. The patient expressed understanding and agreed to proceed.   I discussed the assessment and treatment plan with the patient. The patient was provided an opportunity to ask questions and all were answered. The patient agreed with the plan and demonstrated an understanding of the instructions.   The patient was advised to call back or seek an in-person evaluation if the symptoms worsen or if the condition fails to improve as anticipated.  I provided 15 minutes of non-face-to-face time during this encounter.    S Starkes-Perry, FNP   BH MD/PA/NP OP Progress Note  02/24/2020 12:34 PM Ashley Stephens  MRN:  [id number]   Evaluation: Ashley Stephens was seen and evaluated via Webex. She is alert and oriented, calm and cooperative. She continues to be pleasant and engages well. She reports that she has completed her goals for last week. " I didn't have any meltdowns, and nothing major happened. Nothing bad happened so all in all I had a good week." Her goal today is to remain consistent. " I want to get off the roller coaster. The up and downs and just stay steady. " She states she is sleeping about 5-6 hours a night, and was able to get a nap I yesterday. She is still working and able to maintain a source of income while on leave for her mental health She remains euthymic and is very active in therapeutic milieu. She denies any suicidal ideations, homicidal ideations at this time.  She reports she is compliant with her medications.  Support, encouragement and reassurance was provided.    Visit Diagnosis:    ICD-10-CM   1. Severe episode  of recurrent major depressive disorder, without psychotic features (H[facility])  F33.2     Past Psychiatric History:   Past Medical History:  Past Medical History:  Diagnosis Date  . Anxiety   . Depression   . Gastric ulcer   . GERD (gastroesophageal reflux disease)   . Hypertension   . Osteoarthritis   . Thyroid disorder     Past Surgical History:  Procedure Laterality Date  . ABDOMINAL HYSTERECTOMY  2013  . BARIATRIC SURGERY    . BREAST BIOPSY  1995  . GASTRIC BYPASS  2012  . TUBAL LIGATION  1996    Family Psychiatric History:  Family History:  Family History  Problem Relation Age of Onset  . Breast cancer Maternal Grandmother     Social History:  Social History   Socioeconomic History  . Marital status: Single    Spouse name: Not on file  . Number of children: Not on file  . Years of education: Not on file  . Highest education level: Not on file  Occupational History  . Not on file  Tobacco Use  . Smoking status: Never Smoker  . Smokeless tobacco: Never Used  Vaping Use  . Vaping Use: Never used  Substance and Sexual Activity  . Alcohol use: No    Alcohol/week: 0.0 standard drinks  . Drug use: No  . Sexual activity: Yes    Birth control/protection: Surgical  Other Topics Concern  . Not on file  Social History Narrative  . Not on file     Social Determinants of Health   Financial Resource Strain:   . Difficulty of Paying Living Expenses:   Food Insecurity:   . Worried About Charity fundraiser in the Last Year:   . Arboriculturist in the Last Year:   Transportation Needs:   . Film/video editor (Medical):   Marland Kitchen Lack of Transportation (Non-Medical):   Physical Activity:   . Days of Exercise per Week:   . Minutes of Exercise per Session:   Stress:   . Feeling of Stress :   Social Connections:   . Frequency of Communication with Friends and Family:   . Frequency of Social Gatherings with Friends and Family:   . Attends Religious Services:   .  Active Member of Clubs or Organizations:   . Attends Archivist Meetings:   Marland Kitchen Marital Status:     Allergies: No Known Allergies  Metabolic Disorder Labs: Lab Results  Component Value Date   HGBA1C 6.3 (A) 12/03/2019   No results found for: PROLACTIN Lab Results  Component Value Date   CHOL 203 (H) 07/10/2019   TRIG 88 07/10/2019   HDL 43 07/10/2019   CHOLHDL 4.1 12/27/2017   LDLCALC 144 (H) 07/10/2019   LDLCALC 138 (H) 12/27/2017   Lab Results  Component Value Date   TSH 1.770 07/10/2019   TSH 1.880 06/06/2018    Therapeutic Level Labs: No results found for: LITHIUM No results found for: VALPROATE No components found for:  CBMZ  Current Medications: Current Outpatient Medications  Medication Sig Dispense Refill  . ALPRAZolam (XANAX) 0.25 MG tablet Take 1 tablet (0.25 mg total) by mouth 2 (two) times daily as needed. for anxiety 45 tablet 2  . BIOTIN PO Take by mouth.    . Cyanocobalamin (VITAMIN B 12 PO) Take by mouth.    . Cyanocobalamin 1000 MCG/ML KIT Inject as directed.    . cyclobenzaprine (FLEXERIL) 5 MG tablet     . desvenlafaxine (PRISTIQ) 100 MG 24 hr tablet Take 1 tablet (100 mg total) by mouth every morning. 90 tablet 1  . desvenlafaxine (PRISTIQ) 50 MG 24 hr tablet Take 1 tablet (50 mg total) by mouth every evening. 90 tablet 1  . hydrOXYzine (VISTARIL) 50 MG capsule Take 1 capsule by mouth three times daily as needed 90 capsule 1  . lamoTRIgine (LAMICTAL) 150 MG tablet Take 1 tablet (150 mg total) by mouth 2 (two) times daily. 180 tablet 1  . levothyroxine (SYNTHROID) 25 MCG tablet Take 1 tablet (25 mcg total) by mouth daily before breakfast. 90 tablet 2  . lurasidone (LATUDA) 80 MG TABS tablet Take 1 tablet (80 mg total) by mouth daily with breakfast. 30 tablet 3  . metFORMIN (GLUCOPHAGE) 500 MG tablet Take 1 tab po qd x 1 week, then increase to 1 po BID as tolerated 180 tablet 0  . omeprazole (PRILOSEC) 40 MG capsule Take 1 capsule (40 mg  total) by mouth 2 (two) times daily before a meal. (Patient taking differently: Take 40 mg by mouth 2 (two) times daily as needed. ) 30 capsule 3  . Oxcarbazepine (TRILEPTAL) 300 MG tablet Take 1 tablet (300 mg total) by mouth at bedtime. 90 tablet 0  . propranolol (INDERAL) 20 MG tablet Take 1 tablet (20 mg total) by mouth 3 (three) times daily as needed. 270 tablet 1  . sucralfate (CARAFATE) 1 GM/10ML suspension Take 10 mLs (1 g total) by mouth 4 (four) times daily -  with meals and  at bedtime. 420 mL 0  . Vitamin D, Ergocalciferol, (DRISDOL) 1.25 MG (50000 UT) CAPS capsule Take 1 capsule (50,000 Units total) by mouth every 7 (seven) days. 6 capsule 0   No current facility-administered medications for this visit.     Musculoskeletal:   Psychiatric Specialty Exam: Review of Systems  All other systems reviewed and are negative.   There were no vitals taken for this visit.There is no height or weight on file to calculate BMI.  General Appearance: Casual  Eye Contact:  Good  Speech:  Clear and Coherent  Volume:  Normal  Mood:  Anxious  Affect:  Congruent  Thought Process:  Coherent, Goal Directed, Linear and Descriptions of Associations: Intact  Orientation:  Full (Time, Place, and Person)  Thought Content: WDL   Suicidal Thoughts:  No  Homicidal Thoughts:  No  Memory:  Immediate;   Good Recent;   Good Remote;   Good  Judgement:  Intact  Insight:  Good  Psychomotor Activity:  Normal  Concentration:  Concentration: Good and Attention Span: Good  Recall:  Good  Fund of Knowledge: Good  Language: Good  Akathisia:  Negative  Handed:  Right  AIMS (if indicated):  Assets:  Communication Skills Desire for Improvement Financial Resources/Insurance Housing Physical Health Resilience Social Support  ADL's:  Intact  Cognition: WNL  Sleep:  Good   Screenings: PHQ2-9     Counselor from 02/04/2020 in BEHAVIORAL [facility] PARTIAL HOSPITALIZATION PROGRAM Office Visit from 12/03/2019 in  [facility] Office Visit from 06/26/2019 in [facility] Office Visit from 10/03/2018 in [facility] Office Visit from 06/20/2018 in [facility]  PHQ-2 Total Score 2 0 0 0 2  PHQ-9 Total Score 13 -- -- -- 15       Assessment and Plan: No changes in her treatment plan at this time. Will remain on same medication and dosage. Patient discharge from PHP is anticipating 02/28/2020. She will start IOP on 02/29/2020, as she continues to progress throughout all steps of recommended therapy.  Patient to continue partial hospitalization programming Continue medications as directed  Treatment options and alternatives reviewed with patient and patient understands the plan above.  Treatment plan was reviewed and patient agreed upon by nurse practitioner  Starkes Perry and patient Ashley Stephens need for group services.     S Starkes-Perry, FNP 02/24/2020, 12:34 PM 

## 2020-02-24 NOTE — Psych (Signed)
Virtual Visit via Video Note  I connected with Ashley Stephens on 02/22/20 at  9:00 AM EDT by a video enabled telemedicine application and verified that I am speaking with the correct person using two identifiers.   I discussed the limitations of evaluation and management by telemedicine and the availability of in person appointments. The patient expressed understanding and agreed to proceed.  Location: Patient: Patient Home Provider: Clinical Home Office  I discussed the assessment and treatment plan with the patient. The patient was provided an opportunity to ask questions and all were answered. The patient agreed with the plan and demonstrated an understanding of the instructions.   The patient was advised to call back or seek an in-person evaluation if the symptoms worsen or if the condition fails to improve as anticipated.  Pt was provided 240 minutes of non-face-to-face time during this encounter.   [name], LCSW    [facility] PHP THERAPIST PROGRESS NOTE  Ashley Stephens [id number]  Session Time: 9:00 - 10:00  Participation Level: Active  Behavioral Response: CasualAlertDepressed  Type of Therapy: Group Therapy  Treatment Goals addressed: Coping  Interventions: CBT, DBT, Supportive and Reframing  Summary: Clinician led check-in regarding current stressors and situation, and review of patient completed daily inventory. Clinician utilized active listening and empathetic response and validated patient emotions. Clinician facilitated processing group on pertinent issues.   Therapist Response:Ashley Stephens is a 44 y.o. female who presents with depression and anxiety symptoms. Patient arrived within time allowed and reports that she is feeling "okay." Patient rates her mood at a 6.5 on a scale of 1-10 with 10 being great. Pt reports she spent Saturday doing "nothing" and was happy she allowed herself to rest. Pt states her mood was variable and she  felt mainly "blah." Pt states continuing to struggle with sleep. Pt is able to process. Pt engaged in discussion.       Session Time: 10:00-11:00  Participation Level:Active  Behavioral Response:CasualAlertDepressed  Type of Therapy:Group Therapy  Treatment Goals addressed: Coping  Interventions:CBT, DBT, Solution Focused, Supportive and Reframing  Summary: Cln led discussion on routine and the role it can play in adding structure into our day. Group members discussed their relationship with routine and the barriers they experience in creating/sticking with routine. Cln encouraged flexibility in their definition of routine and starting in smaller chunks of the day.    Therapist Response:Pt engaged in discussion and reports concern with the change in routine that will come when she leaves treatment. Pt is able to brainstorm new ideas to try.       Session Time: 11:00- 12:00  Participation Level:Active  Behavioral Response:CasualAlertDepressed  Type of Therapy: Group Therapy, OT  Treatment Goals addressed: Coping  Interventions:Psychosocial skills training, Supportive  Summary:Occupational Therapy group  Therapist Response:Patient engaged in group. See OT note.         Session Time: 12:00 -1:00  Participation Level:Active  Behavioral Response:CasualAlertDepressed  Type of Therapy:Group therapy  Treatment Goals addressed: Coping  Interventions:CBT; Solution focused; Supportive; Reframing  Summary:12:00 - 12:50:Cln led discussion on healthy thought patterns and utilized handout "11 beliefs that make life better" to aid discussion. Group reviewed the 11 beliefs and discussed which ones felt true and which they struggled with. Group read mantras to  attached to the belief to help instill the new ideas and discussed situations in which the mantras could be beneficial.  12:50 -1:00 Clinician led check-out.  Clinician assessed for immediate needs, medication compliance and efficacy,  and safety concerns  Therapist Response:12:00 - 12:50: Pt engaged in discussion and reports struggling most with control responsibility.    12:50 - 1:00: At check-out, patientrates hermood at a5 on a scale of 1-10 with 10 being great.Pt states afternoon plans of distracting herself.Patient demonstratessomeprogress as evidenced by utilizing thought challenging.Patient denies SI/HI/self-harm at the end of group.    Suicidal/Homicidal: Nowithout intent/plan  Plan: Pt will continue in PHP while working to decrease depression and anxiety symptoms, increase ability to manage symptoms in a healthy manner, and increase ability to regulate emotions.   Diagnosis: Severe episode of recurrent major depressive disorder, without psychotic features ([place]) [F33.2]    1. Severe episode of recurrent major depressive disorder, without psychotic features ([place])   2. Generalized anxiety disorder       [name], [place] 02/24/2020

## 2020-02-24 NOTE — Therapy (Signed)
[place], [place], [address] Phone: [phone]   Fax:  [phone] Virtual Visit via Video Note  I connected with Ashley Stephens on 02/24/20 at  10:00 AM EDT by a video enabled telemedicine application and verified that I am speaking with the correct person using two identifiers.   I discussed the limitations of evaluation and management by telemedicine and the availability of in person appointments. The patient expressed understanding and agreed to proceed.   I discussed the assessment and treatment plan with the patient. The patient was provided an opportunity to ask questions and all were answered. The patient agreed with the plan and demonstrated an understanding of the instructions.   The patient was advised to call back or seek an in-person evaluation if the symptoms worsen or if the condition fails to improve as anticipated.  I provided 65 minutes of non-face-to-face time during this encounter.   Occupational Therapy Treatment  Patient Details  Name: Ashley Stephens MRN: [id number] Date of Birth: 05-22-1976 Referring Provider (OT): Ricky Ala   Encounter Date: 02/24/2020   OT End of Session - 02/24/20 1145    Visit Number 15    Number of Visits 20    Date for OT Re-Evaluation 03/01/20    Authorization Type [employer]    Authorization - Number of Visits 60    OT Start Time 1000    OT Stop Time 1105    OT Time Calculation (min) 65 min    Activity Tolerance Patient tolerated treatment well    Behavior During Therapy WFL for tasks assessed/performed           Past Medical History:  Diagnosis Date  . Anxiety   . Depression   . Gastric ulcer   . GERD (gastroesophageal reflux disease)   . Hypertension   . Osteoarthritis   . Thyroid disorder     Past Surgical History:  Procedure Laterality Date  . ABDOMINAL HYSTERECTOMY  2013  . BARIATRIC SURGERY    . BREAST BIOPSY   1995  . GASTRIC BYPASS  2012  . TUBAL LIGATION  1996    There were no vitals filed for this visit.   Subjective Assessment - 02/24/20 1145    Currently in Pain? No/denies            OT Education - 02/24/20 1145    Education Details Educated on self-accountability and identifying tips and strategies we can utilize to manage our mental health in areas that we have control of    Person(s) Educated Patient    Methods Explanation;Handout    Comprehension Verbalized understanding            OT Short Term Goals - 02/03/20 1440      OT SHORT TERM GOAL #1   Status On-going      OT SHORT TERM GOAL #2   Status On-going      OT SHORT TERM GOAL #3   Status On-going      OT SHORT TERM GOAL #4   Status On-going         Group Session:  S: "Yesterday I allowed myself to take a nap and then I went and got Sweet Frog with my daughter and re-charged. It was much needed."  O: Group began with a reflection and recap from previous OT session focused on accountability vs responsibility and group members identified ways in which we can hold ourselves accountable. Today's group session  was a continuation of the topic of self-accountability, with a strict focus on identifying what we can control and cannot control when it comes to our mental health. Areas of focus included medication management, attending to medical appointments, utilizing and trying out coping strategies, stress management, asking for help, and being honest with ourselves and our emotions. Group members discussed specific strategies that they currently utilize to hold themselves accountable, while also listening to feedback from others on additional, new strategies. Session ended with group members challenged to complete one small goal over the next day and recognize this achievement with a small reward/celebration of small win.    A: Ashley Stephens was active and independent in her participation of discussion and activity. She shared  that she rewards herself when she meets a goal as a means of holding herself accountable to keep her motivation going. She shared an example in that she completed a bunch of errands the day previous and was tired, so she rewarded herself by taking a nap and getting frozen yogurt. She went on to recognize that she felt better and "recharged" after doing so. Ashley Stephens also shared that one way she holds herself accountable for her mental health is recognizing when she needs to take a nap and when she needs to check something off of her to-do list. Appeared receptive and engaged in additional strategies offered by peers and therapist.  P: Continue to attend PHP OT group sessions 5x week for 1 week to promote daily structure, social engagement, and opportunities to develop and utilize adaptive strategies to maximize functional performance in preparation for safe transition and integration back into school, work, and the community.     Plan - 02/24/20 1145    Occupational performance deficits (Please refer to evaluation for details): ADL's;IADL's;Rest and Sleep;Work;Leisure;Social Participation    Body Structure / Function / Physical Skills ADL    Cognitive Skills Attention;Emotional;Energy/Drive;Problem Solve;Temperament/Personality    Psychosocial Skills Coping Strategies;Interpersonal Interaction;Routines and Behaviors;Habits           Patient will benefit from skilled therapeutic intervention in order to improve the following deficits and impairments:   Body Structure / Function / Physical Skills: ADL Cognitive Skills: Attention, Emotional, Energy/Drive, Problem Solve, Temperament/Personality Psychosocial Skills: Coping Strategies, Interpersonal Interaction, Routines and Behaviors, Habits   Visit Diagnosis: Difficulty coping  Frontal lobe and executive function deficit  Severe episode of recurrent major depressive disorder, without psychotic features [facility])    Problem List Patient Active  Problem List   Diagnosis Date Noted  . Major depressive disorder, recurrent episode, severe ([place]) 01/27/2020  . DDD (degenerative disc disease), lumbar 12/16/2019  . B12 deficiency 12/16/2019  . Encounter for general adult medical examination with abnormal findings 07/12/2019  . Borderline diabetes 07/12/2019  . Gastroesophageal reflux disease without esophagitis 07/12/2019  . Encounter for breast cancer screening other than mammogram 07/12/2019  . Dysuria 07/12/2019  . Mild mood disorder ([place]) 06/07/2018  . Generalized anxiety disorder 06/07/2018  . Type 2 diabetes mellitus with hyperglycemia, without long-term current use of insulin ([place]) 12/14/2017  . Impaired fasting glucose 12/14/2017  . Fatigue 12/14/2017  . Abnormal weight gain 12/14/2017  . Vitamin D deficiency 12/14/2017  . Acquired hypothyroidism 12/14/2017    Ponciano Ort, MOT, OTR/L  02/24/2020, 11:46 AM  [place], [place], [address] Phone: [phone]   Fax:  [phone]  Name: Ashley Stephens MRN: [id number] Date of Birth: [birth year]

## 2020-02-25 ENCOUNTER — Other Ambulatory Visit (HOSPITAL_COMMUNITY): Payer: 59

## 2020-02-25 ENCOUNTER — Telehealth (HOSPITAL_COMMUNITY): Payer: Self-pay | Admitting: Psychiatry

## 2020-02-25 ENCOUNTER — Other Ambulatory Visit: Payer: Self-pay

## 2020-02-25 ENCOUNTER — Other Ambulatory Visit (HOSPITAL_COMMUNITY): Payer: 59 | Admitting: Licensed Clinical Social Worker

## 2020-02-25 DIAGNOSIS — F332 Major depressive disorder, recurrent severe without psychotic features: Secondary | ICD-10-CM

## 2020-02-25 DIAGNOSIS — F411 Generalized anxiety disorder: Secondary | ICD-10-CM

## 2020-02-25 NOTE — Telephone Encounter (Signed)
D:  Patient will be transitioning from [facility] to [facility] on 03-01-20.  A:  Placed call to orient patient.  Encouraged pt to verify her insurance benefits.  R:  Pt receptive.

## 2020-02-25 NOTE — Psych (Signed)
Virtual Visit via Video Note  I connected with Ashley Stephens on 02/23/20 at  9:00 AM EDT by a video enabled telemedicine application and verified that I am speaking with the correct person using two identifiers.   I discussed the limitations of evaluation and management by telemedicine and the availability of in person appointments. The patient expressed understanding and agreed to proceed.  Location: Patient: Patient Home Provider: Clinical Home Office  I discussed the assessment and treatment plan with the patient. The patient was provided an opportunity to ask questions and all were answered. The patient agreed with the plan and demonstrated an understanding of the instructions.   The patient was advised to call back or seek an in-person evaluation if the symptoms worsen or if the condition fails to improve as anticipated.  Pt was provided 240 minutes of non-face-to-face time during this encounter.   [name], LCSW    [facility] PHP THERAPIST PROGRESS NOTE  Ashley Stephens [id number]  Session Time: 9:00 - 10:00  Participation Level: Active  Behavioral Response: CasualAlertDepressed  Type of Therapy: Group Therapy  Treatment Goals addressed: Coping  Interventions: CBT, DBT, Supportive and Reframing  Summary: Clinician led check-in regarding current stressors and situation, and review of patient completed daily inventory. Clinician utilized active listening and empathetic response and validated patient emotions. Clinician facilitated processing group on pertinent issues.   Therapist Response:Ashley Stephens is a 44 y.o. female who presents with depression and anxiety symptoms. Patient arrived within time allowed and reports that she is feeling "fine." Patient rates her mood at a 6.5 on a scale of 1-10 with 10 being great. Pt reports she is tired and attempted to nap yesterday but was interrupted by calls. Pt reports she felt annoyed and irritated  and was able to not ruminate. Pt states struggling with not understanding people's actions. Pt is able to process. Pt engaged in discussion.       Session Time: 10:00-11:00  Participation Level:Active  Behavioral Response:CasualAlertDepressed  Type of Therapy:Group Therapy  Treatment Goals addressed: Coping  Interventions:CBT, DBT, Solution Focused, Supportive and Reframing  Summary: Cln led discussion on ways to feel our feelings while trying to engage in healthier habits. Cln discussed the importance of finding balance between creating space for our feelings and managing our reactions to our feelings. Group incorporated boundaries, assertiveness, and cognitive distortions into discussion.    Therapist Response:Pt engaged in discussion and provided and received support and feedback from group. Pt able to process.       Session Time: 11:00- 12:00  Participation Level:Active  Behavioral Response:CasualAlertDepressed  Type of Therapy: Group Therapy, OT  Treatment Goals addressed: Coping  Interventions:Psychosocial skills training, Supportive  Summary:Occupational Therapy group  Therapist Response:Patient engaged in group. See OT note.         Session Time: 12:00 -1:00  Participation Level:Active  Behavioral Response:CasualAlertDepressed  Type of Therapy:Group therapy  Treatment Goals addressed: Coping  Interventions:CBT; Solution focused; Supportive; Reframing  Summary:12:00 - 12:50:Cln led discussion on anger and agressive substitutes. Group members discussed how they express anger and the ways it works or doesn't work for them. Cln suggested finding safe alternatives for agressive outlets such as punhing bag, driving range, or screaming into a pillow.  12:50 -1:00 Clinician led check-out. Clinician assessed for immediate needs, medication compliance and efficacy, and safety concerns  Therapist  Response:12:00 - 12:50: Pt engaged in discussion and reports her anger is explosive. Pt identifies physical alternatives may be beneficial for her. 12:50 -  1:00: At check-out, patientrates hermood at [facility] on a scale of 1-10 with 10 being great.Pt states afternoon plans of running errands and napping.Patient demonstratessomeprogress as evidenced by increased emotional regulation.Patient denies SI/HI/self-harm at the end of group.    Suicidal/Homicidal: Nowithout intent/plan  Plan: Pt will continue in PHP while working to decrease depression and anxiety symptoms, increase ability to manage symptoms in a healthy manner, and increase ability to regulate emotions.   Diagnosis: Severe episode of recurrent major depressive disorder, without psychotic features ([place]) [F33.2]    1. Severe episode of recurrent major depressive disorder, without psychotic features ([place])   2. Generalized anxiety disorder       [name], [place] 02/25/2020

## 2020-02-26 ENCOUNTER — Other Ambulatory Visit (HOSPITAL_COMMUNITY): Payer: 59 | Admitting: Licensed Clinical Social Worker

## 2020-02-26 ENCOUNTER — Other Ambulatory Visit (HOSPITAL_COMMUNITY): Payer: 59

## 2020-02-26 ENCOUNTER — Other Ambulatory Visit: Payer: Self-pay

## 2020-02-26 DIAGNOSIS — F332 Major depressive disorder, recurrent severe without psychotic features: Secondary | ICD-10-CM

## 2020-02-26 DIAGNOSIS — F411 Generalized anxiety disorder: Secondary | ICD-10-CM

## 2020-02-26 NOTE — Psych (Signed)
Virtual Visit via Video Note  I connected with Ashley Stephens on 02/24/20 at  9:00 AM EDT by a video enabled telemedicine application and verified that I am speaking with the correct person using two identifiers.   I discussed the limitations of evaluation and management by telemedicine and the availability of in person appointments. The patient expressed understanding and agreed to proceed.  Location: Patient: Patient Home Provider: Clinical Home Office  I discussed the assessment and treatment plan with the patient. The patient was provided an opportunity to ask questions and all were answered. The patient agreed with the plan and demonstrated an understanding of the instructions.   The patient was advised to call back or seek an in-person evaluation if the symptoms worsen or if the condition fails to improve as anticipated.  Pt was provided 240 minutes of non-face-to-face time during this encounter.   [name], LCSW    [facility] PHP THERAPIST PROGRESS NOTE  Ashley Stephens [id number]  Session Time: 9:00 - 10:00  Participation Level: Active  Behavioral Response: CasualAlertDepressed  Type of Therapy: Group Therapy  Treatment Goals addressed: Coping  Interventions: CBT, DBT, Supportive and Reframing  Summary: Clinician led check-in regarding current stressors and situation, and review of patient completed daily inventory. Clinician utilized active listening and empathetic response and validated patient emotions. Clinician facilitated processing group on pertinent issues.   Therapist Response:Ashley Stephens is a 44 y.o. female who presents with depression and anxiety symptoms. Patient arrived within time allowed and reports that she is feeling "emotional." Patient rates her mood at a 6.5 on a scale of 1-10 with 10 being great. Pt reports she got out of the house yesterday and it felt really good. Pt states she took a nap and ignored texts to  protect her sleep. Pt is able to process. Pt engaged in discussion.       Session Time: 10:00-11:00  Participation Level:Active  Behavioral Response:CasualAlertDepressed  Type of Therapy: Group Therapy, OT  Treatment Goals addressed: Coping  Interventions:Psychosocial skills training, Supportive  Summary:Occupational Therapy group  Therapist Response:Patient engaged in group. See OT note.      Session Time: 11:00- 12:00  Participation Level:Active  Behavioral Response:CasualAlertDepressed  Type of Therapy:Group Therapy  Treatment Goals addressed: Coping  Interventions:CBT, DBT, Solution Focused, Supportive and Reframing  Summary: Cln led discussion on how to build new habits. Group members discussed new behaviors they want to achieve and how they can break it into small, achievable tasks. Cln encouraged pt's to consider adjusting standards and offer themselves grace during their efforts to set new habits.     Therapist Response:Pt engaged in discussion and reports wanting to work on increasing use of distractions.          Session Time: 12:00 -1:00  Participation Level:Active  Behavioral Response:CasualAlertDepressed  Type of Therapy:Group therapy  Treatment Goals addressed: Coping  Interventions:CBT; Solution focused; Supportive; Reframing  Summary:12:00 - 12:50:Cln led discussion on future oriented anxiety. Group members shared worries they are struggling with in the future and "what ifs." Group discussed "borrowing misery" and how to address it. Cln encouraged utilizing CBT thought challenging and group practiced how to apply thought challenging to group provided examples.  12:50 -1:00 Clinician led check-out. Clinician assessed for immediate needs, medication compliance and efficacy, and safety concerns  Therapist Response:12:00 - 12:50: Pt engaged in discussion and reports struggling frequently with  future oriented anxiety. Pt is able to process and demonstrates ability to apply thought challenging.  12:50 - 1:00: At check-out, patientrates hermood at a6 on a scale of 1-10 with 10 being great.Pt states afternoon plans of going to an appointment.Patient demonstratessomeprogress as evidenced by setting a healthy boundary.Patient denies SI/HI/self-harm at the end of group.    Suicidal/Homicidal: Nowithout intent/plan  Plan: Pt will continue in PHP while working to decrease depression and anxiety symptoms, increase ability to manage symptoms in a healthy manner, and increase ability to regulate emotions.   Diagnosis: Severe episode of recurrent major depressive disorder, without psychotic features ([place]) [F33.2]    1. Severe episode of recurrent major depressive disorder, without psychotic features ([place])   2. Generalized anxiety disorder       [name], [place] 02/26/2020

## 2020-02-26 NOTE — Psych (Signed)
Virtual Visit via Video Note  I connected with Ashley Stephens on 02/25/20 at  9:00 AM EDT by a video enabled telemedicine application and verified that I am speaking with the correct person using two identifiers.   I discussed the limitations of evaluation and management by telemedicine and the availability of in person appointments. The patient expressed understanding and agreed to proceed.  Location: Patient: Patient Home Provider: Clinical Home Office  I discussed the assessment and treatment plan with the patient. The patient was provided an opportunity to ask questions and all were answered. The patient agreed with the plan and demonstrated an understanding of the instructions.   The patient was advised to call back or seek an in-person evaluation if the symptoms worsen or if the condition fails to improve as anticipated.  Pt was provided 240 minutes of non-face-to-face time during this encounter.   Ashley Glass, LCSW    [facility] PHP THERAPIST PROGRESS NOTE  Ashley Stephens [id number]  Session Time: 9:00 - 10:00  Participation Level: Active  Behavioral Response: CasualAlertDepressed  Type of Therapy: Group Therapy  Treatment Goals addressed: Coping  Interventions: CBT, DBT, Supportive and Reframing  Summary: Clinician led check-in regarding current stressors and situation, and review of patient completed daily inventory. Clinician utilized active listening and empathetic response and validated patient emotions. Clinician facilitated processing group on pertinent issues.   Therapist Response:Ashley Stephens is a 44 y.o. female who presents with depression and anxiety symptoms. Patient arrived within time allowed and reports that she is feeling "okay." Patient rates her mood at a 5.5 on a scale of 1-10 with 10 being great. Pt reports she went to an appointment yesterday and is unhappy with the results. Pt is able to manage her feelings and recognize  her progress. Pt is able to process. Pt engaged in discussion.       Session Time: 10:00-11:00  Participation Level:Active  Behavioral Response:CasualAlertDepressed  Type of Therapy: Group Therapy  Treatment Goals addressed: Coping  Interventions:CBT, DBT, Solution Focused, Supportive and Reframing  Summary:Cln informed group that she would not be leading group for the next week and provided space for pt's to process the transition and upcoming change.   Therapist Response:Pt engaged in discussion and reports struggling with change. Pt able to process.     Session Time: 11:00- 12:00  Participation Level:Active  Behavioral Response:CasualAlertDepressed  Type of Therapy:Group Therapy  Treatment Goals addressed: Coping  Interventions:CBT, DBT, Solution Focused, Supportive and Reframing  Summary: Cln introduced topic of DBT distress tolerance skills. Cln discussed the role distractions play in managing emotions and how it can aid in recovery. Cln introduced ACCEPTS skills and group discussed A "activities" and how they can practice it.    Therapist Response:Pt engaged in discussion and reports understanding of distractions role in coping. Pt identifies tv, coloring, and shopping as ways to utilize A skills.    Session Time: 12:00 -1:00  Participation Level:Active  Behavioral Response:CasualAlertDepressed  Type of Therapy:Group therapy  Treatment Goals addressed: Coping  Interventions:CBT; Solution focused; Supportive; Reframing  Summary:12:00 - 12:50:Cln continued topic of DBT distress tolerance skills and ACCEPTS. Group discussed CCEP skills and how they can practice them in their every day life. 12:50 -1:00 Clinician led check-out. Clinician assessed for immediate needs, medication compliance and efficacy, and safety concerns  Therapist Response:12:00 - 12:50:  Pt engaged in discussion and identifies ways to utilize  skills discussed. Pt reports most interest in different emotion skills. 12:50 - 1:00: At check-out,  patientrates hermood at a5.5 on a scale of 1-10 with 10 being great.Pt states afternoon plans of going to a dentist appointment.Patient demonstratessomeprogress as evidenced by improved emotional regulation.Patient denies SI/HI/self-harm at the end of group.    Suicidal/Homicidal: Nowithout intent/plan  Plan: Pt will continue in PHP while working to decrease depression and anxiety symptoms, increase ability to manage symptoms in a healthy manner, and increase ability to regulate emotions.   Diagnosis: Severe episode of recurrent major depressive disorder, without psychotic features ([place]) [F33.2]    1. Severe episode of recurrent major depressive disorder, without psychotic features ([place])   2. Generalized anxiety disorder       Ashley Stephens, [place] 02/26/2020

## 2020-02-29 ENCOUNTER — Other Ambulatory Visit: Payer: Self-pay

## 2020-02-29 ENCOUNTER — Other Ambulatory Visit (HOSPITAL_COMMUNITY): Payer: 59 | Admitting: Occupational Therapy

## 2020-02-29 ENCOUNTER — Other Ambulatory Visit (HOSPITAL_COMMUNITY): Payer: 59 | Admitting: Licensed Clinical Social Worker

## 2020-02-29 ENCOUNTER — Encounter (HOSPITAL_COMMUNITY): Payer: Self-pay

## 2020-02-29 DIAGNOSIS — F332 Major depressive disorder, recurrent severe without psychotic features: Secondary | ICD-10-CM

## 2020-02-29 DIAGNOSIS — R41844 Frontal lobe and executive function deficit: Secondary | ICD-10-CM

## 2020-02-29 DIAGNOSIS — F411 Generalized anxiety disorder: Secondary | ICD-10-CM

## 2020-02-29 DIAGNOSIS — R4589 Other symptoms and signs involving emotional state: Secondary | ICD-10-CM

## 2020-02-29 NOTE — Progress Notes (Signed)
Spoke with patient via Webex video call, used 2 identifiers to correctly identify patient. States that she had a good weekend, got a new tattoo on Saturday in memory of her Aunt that passed away. She is being discharged today and starting IOP tomorrow. On scale 1-10 as 10 being worst she rates depression at 4 and anxiety at 4.5. Denies SI/HI or AV hallucinations. PHQ9=3. No side effects from medications. No issues or complaints.

## 2020-02-29 NOTE — Therapy (Signed)
[name], [place], [address] Phone: [phone]   Fax:  [phone] Virtual Visit via Video Note  I connected with Ashley Stephens on 02/29/20 at  11:00 AM EDT by a video enabled telemedicine application and verified that I am speaking with the correct person using two identifiers.   I discussed the limitations of evaluation and management by telemedicine and the availability of in person appointments. The patient expressed understanding and agreed to proceed    I discussed the assessment and treatment plan with the patient. The patient was provided an opportunity to ask questions and all were answered. The patient agreed with the plan and demonstrated an understanding of the instructions.   The patient was advised to call back or seek an in-person evaluation if the symptoms worsen or if the condition fails to improve as anticipated.  I provided 60 minutes of non-face-to-face time during this encounter.  Occupational Therapy Treatment  Patient Details  Name: Ashley Stephens MRN: [id number] Date of Birth: 07-27-1976 Referring Provider (OT): Ricky Ala   Encounter Date: 02/29/2020   OT End of Session - 02/29/20 1239    Visit Number 16    Number of Visits 20    Date for OT Re-Evaluation 03/01/20    Authorization Type [employer]    Authorization - Number of Visits 54    OT Start Time 1111    OT Stop Time 1211    OT Time Calculation (min) 60 min    Activity Tolerance Patient tolerated treatment well    Behavior During Therapy WFL for tasks assessed/performed           Past Medical History:  Diagnosis Date   Anxiety    Depression    Gastric ulcer    GERD (gastroesophageal reflux disease)    Hypertension    Osteoarthritis    Thyroid disorder     Past Surgical History:  Procedure Laterality Date   ABDOMINAL HYSTERECTOMY  2013   [employer]   GASTRIC BYPASS  2012   TUBAL LIGATION  1996    There were no vitals filed for this visit.   Subjective Assessment - 02/29/20 1238    Currently in Pain? No/denies             OT Education - 02/29/20 1239    Education Details Educated on topic of resiliency and identified strategies/tips to bounce back from lifes obstacles and our most difficult struggles with our mental health    Person(s) Educated Patient    Methods Explanation;Handout    Comprehension Verbalized understanding            OT Short Term Goals - 02/29/20 1239      OT SHORT TERM GOAL #1   Title Pt will actively engage in OT group sessions throughout duration of PHP programming, in order to promote daily structure, social engagement, and opportunities to develop and utilize adaptive strategies to maximize functional performance in preparation for safe transition and integration back into school, work, and the community.    Status Achieved      OT SHORT TERM GOAL #2   Title Pt will practice and identify 1-3 adaptive coping strategies she can utilize, in order to safely manage increased depression/anxiety, with min cues, in preparation for safe and healthy reintegration back into the community at discharge.    Status Achieved      OT SHORT  TERM GOAL #3   Title Pt will demonstrate improved ability to communicate feelings/needs/wants, as evidenced by, active participation in OT sessions, throughout duration of PHP programming, in order to safely transition back into the community at discharge.    Status Achieved      OT SHORT TERM GOAL #4   Title Pt will identify 1-3 sleep hygiene strategies she can utilize, in order to improve sleep quality/ADL performance, in preparation for safe and healthy reintegration back into the community at discharge.    Status Achieved         Group Session:  S: "I need to stop focusing on meeting the needs of others and focus on myself. I cannot control what other people  do or think and I have to accept that."  O: Todays group session focused on the topic of resiliency and looked at strategies to overcome lifes biggest obstacles, while also posing the question what happens next (in our mental health journey) after group is over? Group began with members watching a 15-minute TED Talk that focused on this topic of resiliency and posed three strategies to overcome and build our resilience; including recognizing that suffering is a part of human existence (radical acceptance), intentionally focus and recognize what we can and cannot control, tune into the good, and ask yourself if what you are doing is helping or harming the desired outcome. Discussion followed with group members sharing their thoughts and feedback. Further discussion shared specific strategies to consider, including use of two acronyms the 7 Cs to building resiliency and BOUNCE BACK as methods to build our resiliency.    A: Pamala was active and independent in her participation of discussion and activity. She shared that needs to continue to work on and think about this idea of resiliency, though noted it is difficult to bounce back from things, however has learned many helpful strategies in her time in PHP that have shaped her ability to be more resilient. She shared that she has taken a step back in different situations to think about what to do next before reacting and this has strengthened her ability to overcome obstacles she previously would have dwelled on. She also shared that she has to focus more on herself, be selfish, and not focus on the needs of others, especially in times where her needs have not been met. Pt was attentive and engaged for duration; appeared receptive to education and feedback.  P: Pt has met all of her goals for PHP and will be discharged to step-down IOP treatment, beginning tomorrow.    OCCUPATIONAL THERAPY DISCHARGE SUMMARY  Visits from Start of Care: 16    Current functional level related to goals / functional outcomes: Patient has met all of her above-stated PHP goals and will be discharged today to IOP.    Remaining deficits: See above   Education / Equipment: See above  Plan: Patient agrees to discharge.  Patient goals were met. Patient is being discharged due to meeting the stated rehab goals.  ?????        Plan - 02/29/20 1239    Occupational performance deficits (Please refer to evaluation for details): ADL's;IADL's;Rest and Sleep;Work;Leisure;Social Participation    Body Structure / Function / Physical Skills ADL    Cognitive Skills Attention;Emotional;Energy/Drive;Problem Solve;Temperament/Personality    Psychosocial Skills Coping Strategies;Interpersonal Interaction;Routines and Behaviors;Habits           Patient will benefit from skilled therapeutic intervention in order to improve the following deficits and impairments:  Body Structure / Function / Physical Skills: ADL Cognitive Skills: Attention, Emotional, Energy/Drive, Problem Solve, Temperament/Personality Psychosocial Skills: Coping Strategies, Interpersonal Interaction, Routines and Behaviors, Habits   Visit Diagnosis: Difficulty coping  Frontal lobe and executive function deficit  Severe episode of recurrent major depressive disorder, without psychotic features [facility])    Problem List Patient Active Problem List   Diagnosis Date Noted   Major depressive disorder, recurrent episode, severe ([place]) 01/27/2020   DDD (degenerative disc disease), lumbar 12/16/2019   B12 deficiency 12/16/2019   Encounter for general adult medical examination with abnormal findings 07/12/2019   Borderline diabetes 07/12/2019   Gastroesophageal reflux disease without esophagitis 07/12/2019   Encounter for breast cancer screening other than mammogram 07/12/2019   Dysuria 07/12/2019   Mild mood disorder ([place]) 06/07/2018   Generalized anxiety disorder 06/07/2018    Type 2 diabetes mellitus with hyperglycemia, without long-term current use of insulin ([place]) 12/14/2017   Impaired fasting glucose 12/14/2017   Fatigue 12/14/2017   Abnormal weight gain 12/14/2017   Vitamin D deficiency 12/14/2017   Acquired hypothyroidism 12/14/2017    Ponciano Ort, MOT, OTR/L  02/29/2020, 12:40 PM  [place], [place], [address] Phone: [phone]   Fax:  [phone]  Name: Reise Hietala MRN: [id number] Date of Birth: 1976/06/20

## 2020-03-01 ENCOUNTER — Other Ambulatory Visit: Payer: Self-pay

## 2020-03-01 ENCOUNTER — Other Ambulatory Visit (HOSPITAL_COMMUNITY): Payer: 59 | Admitting: Psychiatry

## 2020-03-01 ENCOUNTER — Encounter (HOSPITAL_COMMUNITY): Payer: Self-pay | Admitting: Psychiatry

## 2020-03-01 ENCOUNTER — Ambulatory Visit (HOSPITAL_COMMUNITY): Payer: 59

## 2020-03-01 ENCOUNTER — Other Ambulatory Visit (HOSPITAL_COMMUNITY): Payer: 59

## 2020-03-01 DIAGNOSIS — F332 Major depressive disorder, recurrent severe without psychotic features: Secondary | ICD-10-CM

## 2020-03-01 NOTE — Progress Notes (Signed)
Virtual Visit via Video Note  I connected with Ashley Stephens on 03/01/20 at  9:00 AM EDT by a video enabled telemedicine application and verified that I am speaking with the correct person using two identifiers.  At orientation to the IOP program Case Manager discussed the limitations of evaluation and management by telemedicine and the availability of in person appointments. The patient expressed understanding and agreed to proceed with virtual visits.   Location:  Patient: Patient Home Provider: Home Office   History of Present Illness: MDD, severe   Observations/Objective: Check In: Case Manager checked in with all participants to review discharge dates, insurance authorizations, work-related documents and needs for the treatment team, including medication review and assessment. Case Manager introduced 2 new clients to the program, encouraging others to welcome and start the joining process. Counselor facilitated therapeutic processing with group members to assess mood and current functioning, prompting group members to share about application of skills, progress and challenges in treatment/personal lives. Today is Client's first day in treatment program, sharing about need for treatment, history of mental health condition, goals for treatment and about support system. Client presents with moderate depression and moderate anxiety. Client denied any current SI/HI/psychosis.   Initial Therapeutic Activity: Counselor prompted group to reflect and journal on the following question: What unrealistic expectations do you have in your life? With self, relationships, work, family roles, responsibilities, Social research officer, government. Counselor allowed time to journal, then for open discussion amongst the group about their reflections. Counselor and group members shared strategies in shifting our expectations to be more realistic and to accept the things we cannot change/influence.  Client ended session early due to  family emergency.   Assessment and Plan: Clinician recommends that Client remain in IOP treatment to better manage mental health symptoms, stabilization and to address treatment plan goals. Clinician recommends adherence to crisis/safety plan, taking medications as prescribed, and following up with medical professionals if any issues arise.   Follow Up Instructions: Clinician will send Webex link for next session. The Client was advised to call back or seek an in-person evaluation if the symptoms worsen or if the condition fails to improve as anticipated.     I provided 120 minutes of non-face-to-face time during this encounter.     Lise Auer, LCSW

## 2020-03-01 NOTE — Progress Notes (Signed)
Virtual Visit via Video Note  I connected with Ashley Stephens on @TODAY @ at  9:00 AM EDT by a video enabled telemedicine application and verified that I am speaking with the correct person using two identifiers.   I discussed the limitations of evaluation and management by telemedicine and the availability of in person appointments. The patient expressed understanding and agreed to proceed.  I discussed the assessment and treatment plan with the patient. The patient was provided an opportunity to ask questions and all were answered. The patient agreed with the plan and demonstrated an understanding of the instructions.   The patient was advised to call back or seek an in-person evaluation if the symptoms worsen or if the condition fails to improve as anticipated.  I provided 20 minutes of non-face-to-face time during this encounter.   Patient ID: Ashley Stephens, female   DOB: Jun 22, 1976, 44 y.o.   MRN: [id number] As per previous CCA states: Pt reports per therapist. Pt reports increased depression and anxiety symptoms. Pt sees Ashley Stephens every 2 weeks for counseling (for over a year) and Ashley Stephens for medication (since 2015/12/10); both at [place]. Pt denies any attempts or hospitalizations. Pt reports passive SI. Denies HI/AVH. Pt reports the following stressors: 1) Family: A) Pt's aunt, whom was the "glue that held the family together" passed away in December 10, 2018. Another aunt passed in December 10, 2019. B) Pt's brother tried to commit suicide over a month ago due to health issues and depression. C) Mother: Pt reports not having a good relationship with mother. D) Pt's family all live on one street and "they're all negative and stay in their bubble. They try to drag me into the bubble." E) Pt's 26yo son is "disrespectful" to pt. 2) Supports: Pt reports she is unable to identify supports. 3) Job: Pt works from home, prepandemic, and feels increase isolation. Pt feels guilt about being on leave causing  other co-workers workload to increase. Pt reports struggling to focus and complete tasks when at work. 4) Mental Health: Pt reports she is constantly "fighting the negativity" which is tiring. 5) Grief: The death of 2 aunts recently. Patient's Currently Reported Symptoms/Problems: Increased depression ("It feels like I'm suffocating; like I'm drowing. It's sucking the life out of me."); increased anger; increased irritability; fatigue; passive SI; decreased ADLs; feelings of hopelessness/worthlessness/helplessness; weight gain (20lbs); sleep issues; work problems; anhedonia; low energy; excessive worrying;  Patient transitioned from [facility] today.  Reports that PHP was helpful.  On a scale 1-10 (10 being the worst); pt rates depression a 4 and anxiety a 4.5.  Denies SI/HI or A/V hallucinations.  A:  Oriented pt.  Pt gave verbal consent for treatment, to release chart information to referred providers and to complete any forms if needed.  Pt gave consent for attending group virtually d/t COVID-19 social distancing restrictions.  Encouraged support groups.  Pt will follow up with Ashley Headings, NP and Ashley Stephens, [facility].  R:  Patient receptive.  Dellia Nims, M.Ed,CNA

## 2020-03-02 ENCOUNTER — Other Ambulatory Visit: Payer: Self-pay

## 2020-03-02 ENCOUNTER — Encounter (HOSPITAL_COMMUNITY): Payer: Self-pay

## 2020-03-02 ENCOUNTER — Telehealth: Payer: Self-pay

## 2020-03-02 ENCOUNTER — Ambulatory Visit (HOSPITAL_COMMUNITY): Payer: 59

## 2020-03-02 ENCOUNTER — Other Ambulatory Visit (HOSPITAL_COMMUNITY): Payer: 59 | Admitting: Family

## 2020-03-02 DIAGNOSIS — F332 Major depressive disorder, recurrent severe without psychotic features: Secondary | ICD-10-CM

## 2020-03-02 NOTE — Discharge Summary (Signed)
  [facility] Partial Hospitalization Program Discharge Summary  Ashley Stephens [id number]  Admission date: 01/27/2020 Discharge date: 02/29/2020  Reason for admission: Ashley Stephens is a 44 y.o. African American female presents with worsening depression.  Patient reports multiple job stressors states she is employed by [employer] and more recently her job has become demanding.  Alyse Low reports she continues to struggle with grief and loss due to the passing of her aunties and she reported that she had a brother that committed suicide.  reports she is followed by therapist and psychiatrist at [facility].  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  Reported she was recently initiated on Latuda, Lamictal Xanax and propanolol.  Currently she is denying suicidal or homicidal ideations.  Denies auditory or visual hallucinations. Patient reported that her nxiety and depression hasn't been controled with medication or therapy, as she has trying multiple medication in the past .  Patient reports she is prescribed patient was enrolled in partial psychiatric program on 02/03/20.   Chemical Use History: Denies  Family of Origin Issues: Pt reports increased depression and anxiety symptoms. Pt sees [name] every 2 weeks for counseling (for over a year) and [name] for medication (since 12-Dec-2015); both at [place]. Pt denies any attempts or hospitalizations. Pt reports passive SI. Denies HI/AVH. Pt reports the following stressors: 1) Family: A) Pt's aunt, whom was the "glue that held the family together" passed away in Dec 12, 2018. Another aunt passed in 12/12/19. B) Pt's brother tried to commit suicide over a month ago due to health issues and depression. C) Mother: Pt reports not having a good relationship with mother. D) Pt's family all live on one street and "they're all negative and stay in their bubble. They try to drag me into the bubble." E) Pt's 26yo son is  "disrespectful" to pt. 2) Supports: Pt reports she is unable to identify supports. 3) Job: Pt works from home, prepandemic, and feels increase isolation. Pt feels guilt about being on leave causing other co-workers workload to increase. Pt reports struggling to focus and complete tasks when at work. 4) Mental Health: Pt reports she is constantly "fighting the negativity" which is tiring. 5) Grief: The death of 2 aunts recently.  Progress in Program Toward Treatment Goals: Ongoing, patient attended and participated with daily group session with active and engaged participation.  Attributes her coping skills to attending intensive outpatient therapy.  She is denying suicidal or homicidal ideations.  Denies visual hallucinations.She remain complaint on her medications at this time.  Denying any medication side effects during this assessment  Progress (rationale):  Will step down to IOP.    Suella Broad, FNP 02/29/2020

## 2020-03-02 NOTE — Telephone Encounter (Signed)
Dental procedure clearance faxed back to [facility]. Copy placed in scan.

## 2020-03-02 NOTE — Progress Notes (Signed)
Virtual Visit via Video Note  I connected with [name] on 03/02/20 at  9:00 AM EDT by a video enabled telemedicine application and verified that I am speaking with the correct person using two identifiers.   I discussed the limitations of evaluation and management by telemedicine and the availability of in person appointments. The patient expressed understanding and agreed to proceed.   I discussed the assessment and treatment plan with the patient. The patient was provided an opportunity to ask questions and all were answered. The patient agreed with the plan and demonstrated an understanding of the instructions.   The patient was advised to call back or seek an in-person evaluation if the symptoms worsen or if the condition fails to improve as anticipated.  I provided 15 minutes of non-face-to-face time during this encounter.   Suella Broad, FNP   Behavioral Health Intensive Outpatient Program Assessment Note  Date: 02/03/2020 Name: Ramonia Mcclaran MRN: [id number]   HPI: Shellye Zandi is a 44 y.o. African American female presents with worsening depression.  Patient reports multiple job stressors states she is employed by [employer] and more recently her job has become demanding.  Alyse Low reports she continues to struggle with grief and loss due to the passing of her aunties and she reported that she had a brother that committed suicide.  reports she is followed by therapist and psychiatrist at [facility]  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  Reported she was recently initiated on Latuda, Lamictal Xanax and propanolol.  Currently she is denying suicidal or homicidal ideations.  Denies auditory or visual hallucinations. Patient reported that her nxiety and depression hasn't been controled with medication or therapy, as she has trying multiple medication in the past .    She reports much improvement since her admission to  partial hospiatlization program. She states her approach was not working and therefore she sought help from mental health providers. As a result she states her symptoms have improved and she is now rating her anxiety 1-2/10 on admission, down from 10/10 on admission into the [facility] program. She also reports improvement in her depressive symptoms and currently rates her depression 5/10 with 10 being the worse. This level remains the same no improvement. She states her goals going forward are getting accliamted to IOP, and understanding that mental wellbeing is a continuous process and ongoing evaluation of personal development.   Primary complaints include: anxiety, depression lessened, feeling depressed and feeling suicidal.  Onset of symptoms was abrupt with gradually worsening course since that time. Psychosocial Stressors include the following: family.  Denied previous inpatient admissions.  Denies history of physical or sexual abuse in the past.    I have reviewed the following documentation dated : past psychiatric history, past social and family history and past Review of systems  Complaints of Pain:None  Past Psychiatric History: Depression  Past psychiatric hospitalizations , Past medication trials and Therapy, Out Patient  Currently in treatment with Lutuda, Pristiq, Vistaril and Lamictal   Substance Abuse History: none Use of Alcohol: occasional, social use  Use of Caffeine: denies use Use of over the counter: Denies   Past Surgical History:  Procedure Laterality Date  . ABDOMINAL HYSTERECTOMY  2013  . BARIATRIC SURGERY    . BREAST BIOPSY  1995  . GASTRIC BYPASS  2012  . TUBAL LIGATION  1996    Past Medical History:  Diagnosis Date  . Anxiety   . Depression   . Gastric ulcer   .  GERD (gastroesophageal reflux disease)   . Hypertension   . Osteoarthritis   . Thyroid disorder    Outpatient Encounter Medications as of 02/03/2020  Medication Sig  . ALPRAZolam (XANAX) 0.25 MG  tablet Take 1 tablet (0.25 mg total) by mouth 2 (two) times daily as needed. for anxiety  . BIOTIN PO Take by mouth.  . Cyanocobalamin (VITAMIN B 12 PO) Take by mouth.  . Cyanocobalamin 1000 MCG/ML KIT Inject as directed.  . cyclobenzaprine (FLEXERIL) 5 MG tablet   . desvenlafaxine (PRISTIQ) 100 MG 24 hr tablet Take 1 tablet (100 mg total) by mouth every morning.  . desvenlafaxine (PRISTIQ) 50 MG 24 hr tablet Take 1 tablet (50 mg total) by mouth every evening.  . hydrOXYzine (VISTARIL) 50 MG capsule Take 1 capsule by mouth three times daily as needed  . lamoTRIgine (LAMICTAL) 150 MG tablet Take 1 tablet (150 mg total) by mouth 2 (two) times daily.  Marland Kitchen levothyroxine (SYNTHROID) 25 MCG tablet Take 1 tablet (25 mcg total) by mouth daily before breakfast.  . lurasidone (LATUDA) 80 MG TABS tablet Take 1 tablet (80 mg total) by mouth daily with breakfast.  . metFORMIN (GLUCOPHAGE) 500 MG tablet Take 1 tab po qd x 1 week, then increase to 1 po BID as tolerated  . omeprazole (PRILOSEC) 40 MG capsule Take 1 capsule (40 mg total) by mouth 2 (two) times daily before a meal. (Patient taking differently: Take 40 mg by mouth 2 (two) times daily as needed. )  . Oxcarbazepine (TRILEPTAL) 300 MG tablet Take 1 tablet (300 mg total) by mouth at bedtime.  . propranolol (INDERAL) 20 MG tablet Take 1 tablet (20 mg total) by mouth 3 (three) times daily as needed.  . sucralfate (CARAFATE) 1 GM/10ML suspension Take 10 mLs (1 g total) by mouth 4 (four) times daily -  with meals and at bedtime.  . Vitamin D, Ergocalciferol, (DRISDOL) 1.25 MG (50000 UT) CAPS capsule Take 1 capsule (50,000 Units total) by mouth every 7 (seven) days.   No facility-administered encounter medications on file as of 02/03/2020.   No Known Allergies  Social History   Tobacco Use  . Smoking status: Never Smoker  . Smokeless tobacco: Never Used  Substance Use Topics  . Alcohol use: No    Alcohol/week: 0.0 standard drinks   Functioning  Relationships: good support system Education: College       Please specify degree: some Other Pertinent History: None Family History  Problem Relation Age of Onset  . Breast cancer Maternal Grandmother      Review of Systems Constitutional: negative  Objective:  There were no vitals filed for this visit.  Physical Exam:   Mental Status Exam: Appearance:  Well groomed Psychomotor::  Within Normal Limits Attention span and concentration: Normal Behavior: calm and cooperative Speech:  normal volume Mood:  depressed and anxious Affect:  normal and mood-congruent Thought Process:  Coherent Thought Content:  Logical Orientation:  person, place and time/date Cognition:  grossly intact Insight:  Intact Judgment:  Intact Estimate of Intelligence: Average Fund of knowledge: Aware of current events Memory: Recent and remote intact Abnormal movements: None Gait and station: Normal  Assessment:  Diagnosis: MDD, Adjustment disorder No primary diagnosis found. No diagnosis found.  Indications for admission: Withdrawal from group and failure to partcipate. Endorsing suicidal ideations with a plan, and or inability to contract for safety.   Plan:   patient enrolled in Partial Hospitalization Program, patient's current medications are to be continued, a  comprehensive treatment plan will be developed and side effects of medications have been reviewed with patient  Treatment options and alternatives reviewed with patient and patient understands the plan above.  Treatment plan was reviewed and patient agreed upon by nurse practitioner Sheran Fava and patient [name] need for group services.   Sheran Fava, FNP-BC, PMHNP-BC, DNP

## 2020-03-02 NOTE — Progress Notes (Addendum)
Virtual Visit via Video Note  I connected with Ashley Stephens on 03/02/20 at  9:00 AM EDT by a video enabled telemedicine application and verified that I am speaking with the correct person using two identifiers.  At orientation to the IOP program Case Manager discussed the limitations of evaluation and management by telemedicine and the availability of in person appointments. The patient expressed understanding and agreed to proceed with virtual visits.   Location:  Patient: Patient Home Provider: Home Office   History of Present Illness: MDD, severe   Observations/Objective: Check In: Case Manager checked in with all participants to review discharge dates, insurance authorizations, work-related documents and needs for the treatment team, including medication review and assessment.    Initial Therapeutic Activity: Counselor introduced our guest speaker, Einar Grad, [facility] Pharmacist, who shared about psychiatric medications, side effects, treatment considerations and how to communicate with medical professionals. Group Members asked questions and shared medication concerns. Counselor prompted group members to reference a worksheet called, "Body Scan" to jot down questions and concerns about their physical health in preparation for their upcoming appointments with medical professionals. Client asked specific questions about medications and side effects. Client noted issues with back injury, bone concerns, weight gain, and upcoming surgeries.  Counselor encouraged routine medical check-ups, preparing for appointments, following up with recommendations and seeking specialist, if needed.   Second Therapeutic Activity: Counselor facilitated therapeutic processing with group members to assess mood and current functioning, prompting group members to share about application of skills, progress and challenges in treatment/personal lives. Client shared about a situation where she chose to use anger  management skills to prevent impulsivity and aggression. Client presents with moderate depression and moderate anxiety. Client denied any current SI/HI/psychosis. Counselor provided Group with a Self-Awareness Assessment to work on for homework in preparation for our session tomorrow.  Check Out: Counselor closed program prompting group members to share one self-care practice or productivity activity they plan to engage in today. Client plans to incorporate deep breathing exercises in her night time routine to fall asleep faster and to combat racing thoughts.  Assessment and Plan: Clinician recommends that Client remain in IOP treatment to better manage mental health symptoms, stabilization and to address treatment plan goals. Clinician recommends adherence to crisis/safety plan, taking medications as prescribed, and following up with medical professionals if any issues arise.   Follow Up Instructions: Clinician will send Webex link for next session. The Client was advised to call back or seek an in-person evaluation if the symptoms worsen or if the condition fails to improve as anticipated.     I provided 180 minutes of non-face-to-face time during this encounter.     Lise Auer, LCSW

## 2020-03-03 ENCOUNTER — Other Ambulatory Visit (HOSPITAL_COMMUNITY): Payer: 59

## 2020-03-03 ENCOUNTER — Ambulatory Visit (HOSPITAL_COMMUNITY): Payer: 59

## 2020-03-03 ENCOUNTER — Other Ambulatory Visit (HOSPITAL_COMMUNITY): Payer: 59 | Attending: Psychiatry | Admitting: Psychiatry

## 2020-03-03 ENCOUNTER — Other Ambulatory Visit: Payer: Self-pay

## 2020-03-03 ENCOUNTER — Encounter (HOSPITAL_COMMUNITY): Payer: Self-pay

## 2020-03-03 DIAGNOSIS — Z563 Stressful work schedule: Secondary | ICD-10-CM | POA: Insufficient documentation

## 2020-03-03 DIAGNOSIS — Z634 Disappearance and death of family member: Secondary | ICD-10-CM | POA: Diagnosis not present

## 2020-03-03 DIAGNOSIS — Z6379 Other stressful life events affecting family and household: Secondary | ICD-10-CM | POA: Insufficient documentation

## 2020-03-03 DIAGNOSIS — Z79899 Other long term (current) drug therapy: Secondary | ICD-10-CM | POA: Diagnosis not present

## 2020-03-03 DIAGNOSIS — F332 Major depressive disorder, recurrent severe without psychotic features: Secondary | ICD-10-CM | POA: Insufficient documentation

## 2020-03-03 NOTE — Progress Notes (Addendum)
Virtual Visit via Video Note  I connected with Ashley Stephens on 03/03/20 at  9:00 AM EDT by a video enabled telemedicine application and verified that I am speaking with the correct person using two identifiers.  At orientation to the IOP program Case Manager discussed the limitations of evaluation and management by telemedicine and the availability of in person appointments. The patient expressed understanding and agreed to proceed with virtual visits.   Location:  Patient: Patient Home Provider: Home Office   History of Present Illness: MDD   Observations/Objective: Check In: Case Manager checked in with all participants to review discharge dates, insurance authorizations, work-related documents and needs for the treatment team, including medication review and assessment. Counselor facilitated therapeutic processing with group members to assess mood and current functioning, prompting group members to share about application of skills, progress and challenges in treatment/personal lives. Client reports being able to run errands in the community. 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 Client noted progress in self-regulation. Client noted having 5-6 hours of sleep, with the need for more. Client presents with moderate depression and moderate anxiety. Client denied any current SI/HI/psychosis.    Initial Therapeutic Activity: Counselor prompted group members to reflect on and journal about their personal mission statement. Counselor gave the group examples and how to guide in writing the statements. Group members shared their mission statements aloud and discussed meaning behind the statements. Client stated that her statement is"Strive to be a better version of myself on a daily basis, no matter what obstacles come my way."   Second Therapeutic Activity: Counselor reviewed  the [place] with clients that was given for homework. Clients shared their progress and areas of struggle. Counselor provided psychoeducation, benefits and strategies for doing self-assessments and self-reflection. Group members identified a need to spend more time in relearning and rediscovering themselves, during the vary stages of life they are in. Client noted that she is in a new stage of life, rediscovering self, adjusting values to goals and purpose in life. Client noted that setting boundaries has become easier the more she values herself.   Check Out: Counselor closed program prompting group members to share one self-care practice or productivity activity they plan to engage in today. Client plans to rest and relax, "happily spend time with self."  Assessment and Plan: Clinician recommends that Client remain in IOP treatment to better manage mental health symptoms, stabilization and to address treatment plan goals. Clinician recommends adherence to crisis/safety plan, taking medications as prescribed, and following up with medical professionals if any issues arise.   Follow Up Instructions: Clinician will send Webex link for next session. The Client was advised to call back or seek an in-person evaluation if the symptoms worsen or if the condition fails to improve as anticipated.     I provided 180 minutes of non-face-to-face time during this encounter.     Lise Auer, LCSW

## 2020-03-04 ENCOUNTER — Ambulatory Visit (HOSPITAL_COMMUNITY): Payer: 59

## 2020-03-04 ENCOUNTER — Other Ambulatory Visit (HOSPITAL_COMMUNITY): Payer: 59

## 2020-03-04 ENCOUNTER — Encounter (HOSPITAL_COMMUNITY): Payer: Self-pay

## 2020-03-04 ENCOUNTER — Other Ambulatory Visit (HOSPITAL_COMMUNITY): Payer: 59 | Admitting: Psychiatry

## 2020-03-04 ENCOUNTER — Other Ambulatory Visit: Payer: Self-pay

## 2020-03-04 DIAGNOSIS — F332 Major depressive disorder, recurrent severe without psychotic features: Secondary | ICD-10-CM | POA: Diagnosis not present

## 2020-03-04 NOTE — Progress Notes (Signed)
Virtual Visit via Video Note  I connected with Ashley Stephens on 03/04/20 at  9:00 AM EDT by a video enabled telemedicine application and verified that I am speaking with the correct person using two identifiers.  At orientation to the IOP program Case Manager discussed the limitations of evaluation and management by telemedicine and the availability of in person appointments. The patient expressed understanding and agreed to proceed with virtual visits.   Location:  Patient: Patient Home Provider: Home Office   History of Present Illness: MDD   Observations/Objective: Check In: Case Manager checked in with all participants to review discharge dates, insurance authorizations, work-related documents and needs for the treatment team, including medication review and assessment. Counselor facilitated therapeutic processing with group members to assess mood and current functioning, prompting group members to share about application of skills, progress and challenges in treatment/personal lives. Client reported on multiple application of boundaries with adult children and partner, which is causing less anxiety responses in her life. She stated that she is enjoying her new found sense of independence and wants to protect that right. Client presents with moderate depression and moderat anxiety. Client denied any current SI/HI/psychosis.    Initial Therapeutic Activity: Counselor prompted group members to reflect on and journal about the people who they look up to or are inspired by. Group Members each shared their response, which sparked conversation about who is looking up to them, as well as how they view and share about their mental health in context. Client identified her Ashley Stephens and God as their inspiration.  Second Therapeutic Activity: Counselor engaged the group in a guided imagery, which incorporated deep breathing, positive affirmations, muscle relaxation and visualizations. The guided  imagery was titled Relaxation for Positive Self-Image. Client reported feeling _  Check Out: Counselor closed program prompting group members to share one self-care practice or productivity activity they plan to engage in today. Client plans to go with the flow and enjoy the moment today. Client confirmed adherence to safety plan, no safety issues to report.  Assessment and Plan: Clinician recommends that Client remain in IOP treatment to better manage mental health symptoms, stabilization and to address treatment plan goals. Clinician recommends adherence to crisis/safety plan, taking medications as prescribed, and following up with medical professionals if any issues arise.   Follow Up Instructions: Clinician will send Webex link for next session. The Client was advised to call back or seek an in-person evaluation if the symptoms worsen or if the condition fails to improve as anticipated.     I provided 180 minutes of non-face-to-face time during this encounter.     Lise Auer, LCSW

## 2020-03-08 ENCOUNTER — Other Ambulatory Visit (HOSPITAL_COMMUNITY): Payer: 59 | Admitting: Psychiatry

## 2020-03-08 ENCOUNTER — Encounter (HOSPITAL_COMMUNITY): Payer: Self-pay

## 2020-03-08 ENCOUNTER — Other Ambulatory Visit: Payer: Self-pay

## 2020-03-08 DIAGNOSIS — F332 Major depressive disorder, recurrent severe without psychotic features: Secondary | ICD-10-CM | POA: Diagnosis not present

## 2020-03-08 NOTE — Progress Notes (Signed)
Virtual Visit via Video Note  I connected with Ashley Stephens on 03/08/20 at  9:00 AM EDT by a video enabled telemedicine application and verified that I am speaking with the correct person using two identifiers.  At orientation to the IOP program Case Manager discussed the limitations of evaluation and management by telemedicine and the availability of in person appointments. The patient expressed understanding and agreed to proceed with virtual visits.   Location:  Patient: Patient Home Provider: Home Office   History of Present Illness: MDD   Observations/Objective: Check In: Case Manager checked in with all participants to review discharge dates, insurance authorizations, work-related documents and needs for the treatment team, including medication review and assessment. Case Manager introduced 2 new clients, group members welcomed and began the joining process.    Initial Therapeutic Activity: Counselor facilitated therapeutic processing with group members to assess mood and current functioning, prompting group members to share about application of skills, progress and challenges in treatment/personal lives. Client reports that she experienced increased irritability, agitation and engaged in interactions with others that were triggering for her, which resulted in a "angry outburst". Client noted that she attempted use of coping strategies, but faced resistance from people within her support system, feeling pressured to respond. Client showed good insight and was able to discuss preventative measures to take in the future. Client presents with moderate depression and moderate anxiety. Client denied any current SI/HI/psychosis.  Second Therapeutic Activity: Counselor facilitated a Chief Operating Officer, prompting group members to share are various resources they have used that are helpful in managing their mental health symptoms. Counselor collected resources provided by the group and added  them to an exhaustive list of global, international, state, and local resources they can access as they continue with management of mental health. Counselor requested each group member to look over list and report back on 2-3 resources they plan to access now or in the future.   Check Out: Counselor closed program prompting group members to share one self-care practice or productivity activity they plan to engage in today. Client plans to spend time with self "doing nothing" to regain regulation and feeling of peace. Client confirmed adherence to safety plan, no safety issues to report.  Assessment and Plan: Clinician recommends that Client remain in IOP treatment to better manage mental health symptoms, stabilization and to address treatment plan goals. Clinician recommends adherence to crisis/safety plan, taking medications as prescribed, and following up with medical professionals if any issues arise.   Follow Up Instructions: Clinician will send Webex link for next session. The Client was advised to call back or seek an in-person evaluation if the symptoms worsen or if the condition fails to improve as anticipated.     I provided 180 minutes of non-face-to-face time during this encounter.     Lise Auer, LCSW

## 2020-03-09 ENCOUNTER — Other Ambulatory Visit: Payer: Self-pay

## 2020-03-09 ENCOUNTER — Encounter (HOSPITAL_COMMUNITY): Payer: Self-pay

## 2020-03-09 ENCOUNTER — Other Ambulatory Visit (HOSPITAL_COMMUNITY): Payer: 59 | Admitting: Psychiatry

## 2020-03-09 DIAGNOSIS — F332 Major depressive disorder, recurrent severe without psychotic features: Secondary | ICD-10-CM

## 2020-03-09 DIAGNOSIS — L700 Acne vulgaris: Secondary | ICD-10-CM | POA: Diagnosis not present

## 2020-03-09 NOTE — Progress Notes (Signed)
Virtual Visit via Video Note  I connected with Ashley Stephens on 03/09/20 at  9:00 AM EDT by a video enabled telemedicine application and verified that I am speaking with the correct person using two identifiers.  At orientation to the IOP program Case Manager discussed the limitations of evaluation and management by telemedicine and the availability of in person appointments. The patient expressed understanding and agreed to proceed with virtual visits.   Location:  Patient: Patient Home Provider: Home Office   History of Present Illness: MDD   Observations/Objective: Check In: Case Manager checked in with all participants to review discharge dates, insurance authorizations, work-related documents and needs for the treatment team, including medication review and assessment.    Initial Therapeutic Activity/Processing: Counselor facilitated therapeutic processing with group members to assess mood and current functioning, prompting group members to share about application of skills, progress and challenges in treatment/personal lives. Client reports that she engaged in a self-care practice set/stated boundaries to partner and outlined the consequences. She notes "doing better" with implementing coping skills and that she had a good nights sleep. Client presents with moderate depression and mild anxiety. Client denied any current SI/HI/psychosis.  Second Therapeutic Activity: Counselor provided group with psychoeducation on "Structuring Your Day" to reduce anxiety, combat depression, alleviate mental stress and to regain control of personal lives. Counselor shared information from 3 sources, prompting clients to begin creating a daily and weekly routine utilizing strategies discussed in articles. Counselor allowed time for group members to share other resources/tips within the group.   Check Out: Counselor closed program prompting group members to share one self-care practice or  productivity activity they plan to engage in today. Client plans to take time to get a facial for self-care. Client confirmed adherence to safety plan, no safety issues to report. Counselor allowed time to celebrate a graduating group member. Counselor shared reflections on progress and allow space for group members to share well wishes and appreciates to the graduating client. Counselor prompted graduating client to share takeaways, reflect on progress and final thoughts for the group.  Assessment and Plan: Clinician recommends that Client remain in IOP treatment to better manage mental health symptoms, stabilization and to address treatment plan goals. Clinician recommends adherence to crisis/safety plan, taking medications as prescribed, and following up with medical professionals if any issues arise.   Follow Up Instructions: Clinician will send Webex link for next session. The Client was advised to call back or seek an in-person evaluation if the symptoms worsen or if the condition fails to improve as anticipated.     I provided 180 minutes of non-face-to-face time during this encounter.     Lise Auer, LCSW

## 2020-03-10 ENCOUNTER — Encounter (HOSPITAL_COMMUNITY): Payer: Self-pay

## 2020-03-10 ENCOUNTER — Other Ambulatory Visit (HOSPITAL_COMMUNITY): Payer: 59 | Admitting: Psychiatry

## 2020-03-10 ENCOUNTER — Other Ambulatory Visit: Payer: Self-pay

## 2020-03-10 DIAGNOSIS — F332 Major depressive disorder, recurrent severe without psychotic features: Secondary | ICD-10-CM | POA: Diagnosis not present

## 2020-03-10 NOTE — Psych (Signed)
Virtual Visit via Video Note  I connected with Ashley Stephens on 02/16/20 at  9:00 AM EDT by a video enabled telemedicine application and verified that I am speaking with the correct person using two identifiers.   I discussed the limitations of evaluation and management by telemedicine and the availability of in person appointments. The patient expressed understanding and agreed to proceed.  Location: Patient: Patient Home Provider: Clinical Home Office  I discussed the assessment and treatment plan with the patient. The patient was provided an opportunity to ask questions and all were answered. The patient agreed with the plan and demonstrated an understanding of the instructions.   The patient was advised to call back or seek an in-person evaluation if the symptoms worsen or if the condition fails to improve as anticipated.  Pt was provided 240 minutes of non-face-to-face time during this encounter.   [name], [facility]    [facility] PHP THERAPIST PROGRESS NOTE  Ashley Stephens [id number]  Session Time: 9:00 - 10:00  Participation Level: Active  Behavioral Response: CasualAlertDepressed  Type of Therapy: Group Therapy  Treatment Goals addressed: Coping  Interventions: CBT, DBT, Supportive and Reframing  Summary: Clinician led check-in regarding current stressors and situation, and review of patient completed daily inventory. Clinician utilized active listening and empathetic response and validated patient emotions. Clinician facilitated processing group on pertinent issues.   Therapist Response:Ashley Stephens is a 44 y.o. female who presents with depression and anxiety symptoms. Patient arrived within time allowed and reports that she is feeling "OK." Patient rates her mood at a 7 on a scale of 1-10 with 10 being great. Pt reports struggling yesterday due to routine being upset by not knowing if she would have to abruptly return to work.  Pt  reports she was able to manage some of the anxiety but not as much as she would have liked. Pt able to process. Pt engaged in discussion.         Session Time: 10:00 -11:00   Participation Level: Active   Behavioral Response: CasualAlertDepressed   Type of Therapy: Group Therapy   Treatment Goals addressed: Coping   Interventions: CBT, DBT, Solution Focused, Supportive and Reframing   Summary: Cln introduced topic of fair fighting rules. Group discussed.   Therapist Response: Pt engaged in discussion. Pt reports she struggles with fair fighting and will use techniques in the future.       Session Time: 11:00- 12:00   Participation Level: Active   Behavioral Response: CasualAlertDepressed   Type of Therapy: Group Therapy, OT   Treatment Goals addressed: Coping   Interventions: Psychosocial skills training, Supportive   Summary: Occupational Therapy group   Therapist Response: Patient engaged in group. See OT note.            Session Time: 12:00 -1:00   Participation Level: Active   Behavioral Response: CasualAlertDepressed   Type of Therapy: Group therapy   Treatment Goals addressed: Coping   Interventions: CBT; Solution focused; Supportive; Reframing   Summary: 12:00 - 12:50: Clinician introduced the topic of "Radical Acceptance". Patients identified and discussed areas of life where radical acceptance would be useful.  12:50 -1:00 Clinician led check-out. Clinician assessed for immediate needs, medication compliance and efficacy, and safety concerns   Therapist Response: 12:00 - 12:50: Pt engaged in discussion and reports relating to should and fighting against things she does not have control over.  12:50 -1:00 Clinician led check-out. Clinician assessed for immediate needs, medication compliance  and efficacy, and safety concerns.  Therapist Response: At [place], patient rates her mood at an 8 on a scale of 1-10 with 10 being great. Pt states  afternoon plans of relaxing and running some errands. Patient demonstrates some progress as evidenced by increased ability to manage some anxiety symptoms. Patient denies SI/HI/self-harm at the end of group.   Suicidal/Homicidal: Nowithout intent/plan  Plan: Pt will continue in PHP while working to decrease depression and anxiety symptoms, increase ability to manage symptoms in a healthy manner, and increase ability to regulate emotions.   Diagnosis: Severe episode of recurrent major depressive disorder, without psychotic features ([place]) [F33.2]    1. Severe episode of recurrent major depressive disorder, without psychotic features ([place])   2. Generalized anxiety disorder       [name], [facility] 02/16/2020

## 2020-03-10 NOTE — Progress Notes (Signed)
Virtual Visit via Video Note  I connected with Ashley Stephens on 03/10/20 at  9:00 AM EDT by a video enabled telemedicine application and verified that I am speaking with the correct person using two identifiers.  At orientation to the IOP program Case Manager discussed the limitations of evaluation and management by telemedicine and the availability of in person appointments. The patient expressed understanding and agreed to proceed with virtual visits.   Location:  Patient: Patient Home Provider: Home Office   History of Present Illness: MDD   Observations/Objective: Check In: Case Manager checked in with all participants to review discharge dates, insurance authorizations, work-related documents and needs for the treatment team, including medication review and assessment. Counselor facilitated therapeutic processing with group members to assess mood and current functioning, prompting group members to share about application of skills, progress and challenges in treatment/personal lives. Client reports that she was able to engage in her facial, it was enjoyable and it gave her an opportunity to express her needs to the provider.. Client presents with mild depression and moderate anxiety. Client denied any current SI/HI/psychosis.   Initial Therapeutic Activity/Processing: Counselor introduced Karsten Ro, [facility] to present information and discussion on Grief and Loss. Group members engaged in discussion, sharing how grief impacts them, what comforts them, what emotions are felt, labeling losses, etc. After guest speaker logged off, Counselor prompted group to spend 10-15 minutes journaling to process personal grief and loss situations. Counselor processed entries with group and client's identified areas for additional processing in individual therapy. Client noted experiencing loss of her aunt, having a deep grief response, to realize her aunt had prepared her for how to handle  things all along. Acceptance has been central in her healing.  Second Therapeutic Activity: To alleviate grief response, after break Group Members were prompted to share about their favorite movie or tv shows that serve as an escape or for entertainment. Client shared that she enjoys reality tv as an escape.Counselor provided group with two exhaustive lists of coping skills that were broken down into categories of relevance. Counselor prompted Group Members to choose 3 each that they would be willing to try or implement in prevention or crisis divergence to reduce mental health symptoms and responses. Client stated that she would try couning down to 10, laughing, and planning a fun trip.  Check Out: Counselor allowed time to celebrate 2 graduating group members. Counselor shared reflections on progress and allow space for group members to share well wishes and appreciates to the graduating client. Counselor prompted graduating client to share takeaways, reflect on progress and final thoughts for the group. Client confirmed adherence to safety plan, no safety issues to report  Assessment and Plan: Clinician recommends that Client remain in IOP treatment to better manage mental health symptoms, stabilization and to address treatment plan goals. Clinician recommends adherence to crisis/safety plan, taking medications as prescribed, and following up with medical professionals if any issues arise.   Follow Up Instructions: Clinician will send Webex link for next session. The Client was advised to call back or seek an in-person evaluation if the symptoms worsen or if the condition fails to improve as anticipated.     I provided 180 minutes of non-face-to-face time during this encounter.     Lise Auer, LCSW

## 2020-03-10 NOTE — Psych (Signed)
Virtual Visit via Video Note  I connected with Ashley Stephens on 02/17/20 at  9:00 AM EDT by a video enabled telemedicine application and verified that I am speaking with the correct person using two identifiers.   I discussed the limitations of evaluation and management by telemedicine and the availability of in person appointments. The patient expressed understanding and agreed to proceed.  Location: Patient: Patient Home Provider: Clinical Home Office  I discussed the assessment and treatment plan with the patient. The patient was provided an opportunity to ask questions and all were answered. The patient agreed with the plan and demonstrated an understanding of the instructions.   The patient was advised to call back or seek an in-person evaluation if the symptoms worsen or if the condition fails to improve as anticipated.  Pt was provided 240 minutes of non-face-to-face time during this encounter.   [name], [facility]    [facility] PHP THERAPIST PROGRESS NOTE  Ashley Stephens [id number]  Session Time: 9:00 - 10:00  Participation Level: Active  Behavioral Response: CasualAlertDepressed  Type of Therapy: Group Therapy  Treatment Goals addressed: Coping  Interventions: CBT, DBT, Supportive and Reframing  Summary: Clinician led check-in regarding current stressors and situation, and review of patient completed daily inventory. Clinician utilized active listening and empathetic response and validated patient emotions. Clinician facilitated processing group on pertinent issues.   Therapist Response:Ashley Stephens is a 44 y.o. female who presents with depression and anxiety symptoms.Patient arrived within time allowed and reports that she is feeling "good- like neutral- nothing good but nothing bad going on." Patient rates her mood at a 7 on a scale of 1-10 with 10 being great. Pt reports having dinner with a friend and running errands yesterday. Pt  engaged in discussion.         Session Time: 10:00 -11:00   Participation Level: Active   Behavioral Response: CasualAlertDepressed   Type of Therapy: Group Therapy   Treatment Goals addressed: Coping   Interventions: CBT, DBT, Solution Focused, Supportive and Reframing   Summary: Cln introduced topic of distractions for continued help to manage symptoms as well as how to find moments of motivation. Group discussed.   Therapist Response: Pt engaged in discussion. Pt reports needing to find new distractions instead of falling back on old ones. Pt reports lacking motivation to follow-up at times, but has seen progress since starting group.         Session Time: 11:00 -12:00   Participation Level: Active   Behavioral Response: CasualAlertDepressed   Type of Therapy: Group Therapy, psychotherapy   Treatment Goals addressed: Coping   Interventions: Strengths based, reframing, Supportive,    Summary:  Spiritual Care group   Therapist Response: Patient engaged in group. See chaplain note.     Session Time: 12:00 -1:00   Participation Level: Active   Behavioral Response: CasualAlertDepressed   Type of Therapy: Group therapy   Treatment Goals addressed: Coping   Interventions: CBT; Solution focused; Supportive; Reframing   Summary: 12:00 - 12:50: See OT Note  12:50 -1:00 Clinician led check-out. Clinician assessed for immediate needs, medication compliance and efficacy, and safety concerns   Therapist Response: 12:00 - 12:50: See OT Note   12:50-1:00: At check-out, patient rates her mood at a 2 on a scale of 1-10 with 10 being great. Pt states her back is hurting and she just got an email about her FMLA not being received. Pt continues to struggle with mood dependent thinking. Patient  demonstrates some progress as evidenced by ability to discuss and work through problem instead of shutting down. Patient denies SI/HI/self-harm at the end of  group.   Suicidal/Homicidal: Nowithout intent/plan  Plan: Pt will continue in PHP while working to decrease depression and anxiety symptoms, increase ability to manage symptoms in a healthy manner, and increase ability to regulate emotions.   Diagnosis: Severe episode of recurrent major depressive disorder, without psychotic features ([place]) [F33.2]    1. Severe episode of recurrent major depressive disorder, without psychotic features ([place])   2. Generalized anxiety disorder   3. Difficulty coping       [name], [facility] 02/17/2020

## 2020-03-10 NOTE — Psych (Signed)
Virtual Visit via Video Note  I connected with Ashley Stephens on 02/26/20 at  9:00 AM EDT by a video enabled telemedicine application and verified that I am speaking with the correct person using two identifiers.   I discussed the limitations of evaluation and management by telemedicine and the availability of in person appointments. The patient expressed understanding and agreed to proceed.  Location: Patient: Patient Home Provider: Clinical Home Office  I discussed the assessment and treatment plan with the patient. The patient was provided an opportunity to ask questions and all were answered. The patient agreed with the plan and demonstrated an understanding of the instructions.   The patient was advised to call back or seek an in-person evaluation if the symptoms worsen or if the condition fails to improve as anticipated.  Pt was provided 240 minutes of non-face-to-face time during this encounter.   Ashley Stephens, [facility]    [facility] PHP THERAPIST PROGRESS NOTE  Ashley Stephens [id number]  Session Time: 9:00 - 10:00  Participation Level: Active  Behavioral Response: CasualAlertDepressed  Type of Therapy: Group Therapy  Treatment Goals addressed: Coping  Interventions: CBT, DBT, Supportive and Reframing  Summary: Clinician led check-in regarding current stressors and situation, and review of patient completed daily inventory. Clinician utilized active listening and empathetic response and validated patient emotions. Clinician facilitated processing group on pertinent issues.   Therapist Response:Ashley Stephens is a 44 y.o. female who presents with depression and anxiety symptoms. Patient arrived within time allowed and reports that she is feeling "good." Patient rates her mood at a 6.5 on a scale of 1-10 with 10 being great. Pt reports she is on her way to doctor's appointment but wanted to check in with group before appointment. Pt reports she had  a good afternoon despite a delay issue at her dentist office. Pt able to identify the difference in how she handled situation at this time vs. how she probably would have handle situation a few weeks ago. Pt identified this as good progress. Pt continues to struggle with black/white thinking but able to identify some "gray" more often. Pt engaged in discussion.      Session Time: 10:00 -11:00   Participation Level: Active   Behavioral Response: CasualAlertDepressed   Type of Therapy: Group Therapy   Treatment Goals addressed: Coping   Interventions: CBT, DBT, Solution Focused, Supportive and Reframing   Summary: Cln introduced topic of work/life balance, self-care vs. self-sabotage. Group discussed.   Therapist Response: Pt engaged in discussion. Pt reports needing to find more balance in life and listen to self as to what is needed in the moment. Pt reports feeling progress when she focuses on self.        Pt left session at 10:30 due to previously schedule appointment. Patient denies SI/HI/self-harm before leaving group.   Suicidal/Homicidal: Nowithout intent/plan  Plan: Pt will continue in PHP while working to decrease depression and anxiety symptoms, increase ability to manage symptoms in a healthy manner, and increase ability to regulate emotions.   Diagnosis: Severe episode of recurrent major depressive disorder, without psychotic features ([place]) [F33.2]    1. Severe episode of recurrent major depressive disorder, without psychotic features ([place])   2. Generalized anxiety disorder       Ashley Stephens, [facility] 02/26/2020

## 2020-03-10 NOTE — Psych (Signed)
Virtual Visit via Video Note  I connected with Ashley Stephens on 02/29/20 at  9:00 AM EDT by a video enabled telemedicine application and verified that I am speaking with the correct person using two identifiers.   I discussed the limitations of evaluation and management by telemedicine and the availability of in person appointments. The patient expressed understanding and agreed to proceed.  Location: Patient: Patient Home Provider: Clinical Home Office  I discussed the assessment and treatment plan with the patient. The patient was provided an opportunity to ask questions and all were answered. The patient agreed with the plan and demonstrated an understanding of the instructions.   The patient was advised to call back or seek an in-person evaluation if the symptoms worsen or if the condition fails to improve as anticipated.  Pt was provided 240 minutes of non-face-to-face time during this encounter.   [name], [facility]    [facility] PHP THERAPIST PROGRESS NOTE  Ashley Stephens [id number]  Session Time: 9:00 - 10:00  Participation Level: Active  Behavioral Response: CasualAlertDepressed  Type of Therapy: Group Therapy  Treatment Goals addressed: Coping  Interventions: CBT, DBT, Supportive and Reframing  Summary: Clinician led check-in regarding current stressors and situation, and review of patient completed daily inventory. Clinician utilized active listening and empathetic response and validated patient emotions. Clinician facilitated processing group on pertinent issues.   Therapist Response:Ashley Stephens is a 44 y.o. female who presents with depression and anxiety symptoms. Patient arrived within time allowed and reports that she is feeling "OK." Patient rates her mood at a 6.5 on a scale of 1-10 with 10 being great. Pt reports she had a good doctor's appointment on Friday. She spent time in [place] with daughter on Friday night and got a  new tattoo on Saturday. Pt continues to struggle with understanding she cannot control how others behave or their decisions. Pt reports anxiety related to discharge. Pt able to process. Pt engaged in discussion.      Session Time: 10:00 -11:00   Participation Level: Active   Behavioral Response: CasualAlertDepressed   Type of Therapy: Group Therapy   Treatment Goals addressed: Coping   Interventions: CBT, DBT, Solution Focused, Supportive and Reframing   Summary: Cln introduced topic of emotional, rational, and wise minds. Group discussed.   Therapist Response: Pt engaged in discussion. Pt able to identify situations in life where different minds were used. Pt aware of minds and states will try to live in wise mind in future.         Session Time: 11:00- 12:00   Participation Level: Active   Behavioral Response: CasualAlertDepressed   Type of Therapy: Group Therapy, OT   Treatment Goals addressed: Coping   Interventions: Psychosocial skills training, Supportive   Summary: Occupational Therapy group   Therapist Response: Patient engaged in group. See OT note.            Session Time: 12:00 -1:00   Participation Level: Active   Behavioral Response: CasualAlertDepressed   Type of Therapy: Group therapy   Treatment Goals addressed: Coping   Interventions: CBT; Solution focused; Supportive; Reframing   Summary: 12:00 - 12:50: Clinician continued topic of "Distress Tolerance". Group discussed Self Soothe and how/when patients can employ these methods to help.  Patients identified when these techniques may be helpful in their personal lives. 12:50 -1:00 Clinician led check-out. Clinician assessed for immediate needs, medication compliance and efficacy, and safety concerns   Therapist Response: 12:00 - 12:50: Pt  reports understanding of self-soothe and identified when skills could be useful.  12:50 - 1:00: At check-out, patient rates her mood at a 7 on a scale of  1-10 with 10 being great. Pt states afternoon plans of taking a nap and running errands. Patient demonstrates some progress as evidenced by increased ability to see other's perspective. Patient denies SI/HI/self-harm at the end of group.   Suicidal/Homicidal: Nowithout intent/plan  Plan: Patient will discharge from [place] due to meeting treatment goals of decreasing depression and anxiety symptoms, increase ability to manage symptoms in a healthy manner, and increased ability to regulate emotions.  Progress was measured by observation, self-report, and scales. Provider has approved discharge and patient reports alignment with discharge plan. Patient will step down to IOP within this agency. Patient is scheduled to start IOP on Tuesday, 6/29. Patient denies any SI/HI at time of discharge.  Diagnosis: Severe episode of recurrent major depressive disorder, without psychotic features ([place]) [F33.2]    1. Severe episode of recurrent major depressive disorder, without psychotic features ([place])   2. Generalized anxiety disorder       [name], [facility] 02/29/2020

## 2020-03-11 ENCOUNTER — Other Ambulatory Visit (HOSPITAL_COMMUNITY): Payer: 59 | Admitting: Psychiatry

## 2020-03-11 ENCOUNTER — Ambulatory Visit: Payer: 59 | Admitting: Psychiatry

## 2020-03-11 ENCOUNTER — Other Ambulatory Visit: Payer: Self-pay

## 2020-03-11 ENCOUNTER — Encounter (HOSPITAL_COMMUNITY): Payer: Self-pay

## 2020-03-11 DIAGNOSIS — F411 Generalized anxiety disorder: Secondary | ICD-10-CM

## 2020-03-11 DIAGNOSIS — F332 Major depressive disorder, recurrent severe without psychotic features: Secondary | ICD-10-CM | POA: Diagnosis not present

## 2020-03-11 NOTE — Progress Notes (Signed)
Virtual Visit via Video Note  I connected with Ashley Stephens on 03/11/20 at  9:00 AM EDT by a video enabled telemedicine application and verified that I am speaking with the correct person using two identifiers.  At orientation to the IOP program Case Manager discussed the limitations of evaluation and management by telemedicine and the availability of in person appointments. The patient expressed understanding and agreed to proceed with virtual visits throughout the duration of the program.   Location:  Patient: Patient Home Provider: Home Office   History of Present Illness: MDD   Observations/Objective: Check In: Case Manager checked in with all participants to review discharge dates, insurance authorizations, work-related documents and needs of the treatment team, including medication review and assessment. Counselor facilitated therapeutic processing with group members to assess mood and current functioning, prompting group members to share about application of skills, progress and challenges in treatment/personal lives. Client reports that she was able to rest yesterday, however she experienced a physical issue which impacted funcioning. Reports feeling better today. Client discussed issue that occurred during group yesterday that affected her and how she plans to communicate in future sessions. Counselor extended empathy and understanding, encouraging client to continue to share and engage. Client presents with moderate depression and mild anxiety. Client denied any current SI/HI/psychosis.   Initial Therapeutic Activity: Counselor introduced Genoveva Ill, representative with [facility] to share about programming. Group Members asked questions and engaged in discussion, as Cristie Hem shared about Peer Support, Support Groups and the [employer]. Client stated that they are interested in connecting with the Anger management Group.  Second Therapeutic Activity:  Counselor provided psychoeducation through 2 videos created by a doctor and a psychologist in reducing work-related stress on the job and at home. Counselor prompted group members to share takeaways, with Client reporting that she needed to be reminded of the strategies presented, as she returns to work, to be more balanced. Client provided 3 additional articles for group to review for homework related to the same topic.  Check Out: Counselor closed program by allowing time to celebrate a graduating group member. Counselor shared reflections on progress and allow space for group members to share well wishes and appreciates to the graduating client. Counselor prompted graduating client to share takeaways, reflect on progress and final thoughts for the group. Client confirmed adherence to safety plan, no safety issues to report.  Assessment and Plan: Clinician recommends that Client remain in IOP treatment to better manage mental health symptoms, stabilization and to address treatment plan goals. Clinician recommends adherence to crisis/safety plan, taking medications as prescribed, and following up with medical professionals if any issues arise.   Follow Up Instructions: Clinician will send Webex link for next session. The Client was advised to call back or seek an in-person evaluation if the symptoms worsen or if the condition fails to improve as anticipated.     I provided 180 minutes of non-face-to-face time during this encounter.     Lise Auer, LCSW

## 2020-03-14 ENCOUNTER — Other Ambulatory Visit (HOSPITAL_COMMUNITY): Payer: 59 | Admitting: Psychiatry

## 2020-03-14 ENCOUNTER — Encounter (HOSPITAL_COMMUNITY): Payer: Self-pay

## 2020-03-14 ENCOUNTER — Other Ambulatory Visit: Payer: Self-pay

## 2020-03-14 DIAGNOSIS — F332 Major depressive disorder, recurrent severe without psychotic features: Secondary | ICD-10-CM | POA: Diagnosis not present

## 2020-03-14 NOTE — Progress Notes (Signed)
Virtual Visit via Video Note  I connected with Ashley Stephens on 03/14/20 at  9:00 AM EDT by a video enabled telemedicine application and verified that I am speaking with the correct person using two identifiers.  At orientation to the IOP program Case Manager discussed the limitations of evaluation and management by telemedicine and the availability of in person appointments. The patient expressed understanding and agreed to proceed with virtual visits throughout the duration of the program.   Location:  Patient: Patient Home Provider: Home Office   History of Present Illness: MDD   Observations/Objective: Check In: Case Manager checked in with all participants to review discharge dates, insurance authorizations, work-related documents and needs of the treatment team, including medication review and assessment. Case Manager welcomed a new client, with group members welcoming and starting the joining process. Counselor facilitated therapeutic processing with group members to assess mood and current functioning, prompting group members to share about application of skills, progress and challenges in treatment/personal lives. Client reports having a good weekend overall, being able to make decisions, spend time with family and partner, shopping and going out to eat. Client celebrated success in increased functioning. Client supportive and encouraging with other participants. Client presents with moderate depression and moderate anxiety. Client denied any current SI/HI/psychosis.   Initial Therapeutic Activity: Counselor prompted group members to create a vin diagram, one circle representing anxiety symptoms and the other representing depressive symptoms, with the overlapping section, those in common for both anxiety and depression. Group Members shared their reflections and self-assessments. Counselor shared psychoeducation on use of this activity in self-awareness, communicating needs, and  labeling feelings. Client noted anxiety from not being organized, relationships and work, depression from family conflicts, and overlap in relationships, work and family. Client noted melting down and feeling hopeless as triggers.   Second Therapeutic Activity: Counselor shared a video by Anne Fu, LMFT Therapist in a Nutshell, about how avoidance is what overlaps depression and anxiety. Discussing the concepts of learning to feel better vs feeling better, willingness, numbing and loss of capacity to feel. Counselor received feedback from group members on their [place] and how to apply concepts. Client noted that she is now aware that medications do not fix mental health, attending group doesn't fix, but facing problems head on moves her through depression and anxiety the most.   Check Out: Counselor reviewed upcoming sessions for the week, took pending questions and addressed needs through sending various links to clients after the session ended, as requested.   Assessment and Plan: Clinician recommends that Client remain in IOP treatment to better manage mental health symptoms, stabilization and to address treatment plan goals. Clinician recommends adherence to crisis/safety plan, taking medications as prescribed, and following up with medical professionals if any issues arise.   Follow Up Instructions: Clinician will send Webex link for next session. The Client was advised to call back or seek an in-person evaluation if the symptoms worsen or if the condition fails to improve as anticipated.     I provided 180 minutes of non-face-to-face time during this encounter.     Lise Auer, LCSW

## 2020-03-15 ENCOUNTER — Other Ambulatory Visit (HOSPITAL_COMMUNITY): Payer: 59 | Admitting: Psychiatry

## 2020-03-15 ENCOUNTER — Other Ambulatory Visit: Payer: Self-pay

## 2020-03-15 ENCOUNTER — Encounter (HOSPITAL_COMMUNITY): Payer: Self-pay

## 2020-03-15 DIAGNOSIS — F332 Major depressive disorder, recurrent severe without psychotic features: Secondary | ICD-10-CM

## 2020-03-15 NOTE — Progress Notes (Signed)
Virtual Visit via Video Note  I connected with [name] on 03/15/20 at  9:00 AM EDT by a video enabled telemedicine application and verified that I am speaking with the correct person using two identifiers.  At orientation to the IOP program Case Manager discussed the limitations of evaluation and management by telemedicine and the availability of in person appointments. The patient expressed understanding and agreed to proceed with virtual visits throughout the duration of the program.   Location:  Patient: Patient Home Provider: Home Office   History of Present Illness: MDD   Observations/Objective: Check In: Case Manager checked in with all participants to review discharge dates, insurance authorizations, work-related documents and needs of the treatment team, including medication review and assessment. Counselor facilitated therapeutic processing with group members to assess mood and current functioning, prompting group members to share about application of skills, progress and challenges in treatment/personal lives. Client reports having a "low key" day yesterday and feeling good today. Client noted that she has been able to repair relationship with partner from angry outburst last week, with sticking with her values and boundaries. Client reports being excited about returning to work and seeing how she finds success in applying the skills in the workplace. Client presents with moderate depression and moderate anxiety. Client denied any current SI/HI/psychosis.   Initial Therapeutic Activity: Counselor reviewed and shared psychoeducation on the Cycle of Anxiety and the Cycle of Depression, with the use of 2 worksheets. Clients identified with the cycles and discussed coping skills that are helpful in breaking the cycle.   Second Therapeutic Activity: Counselor shared a video outlining 15 different cognitive distortions. Counselor prompted group members to jot down which ones  they most identify with or struggle with. Client reported that she catastrophizes "everything", noting that the outcome rarely matches her imagination.   Check Out: Counselor closed program prompting group members to share one skill they plan to apply over the coming days. Client chose to bring paperwork to the Case Manager and to rest.  Assessment and Plan: Clinician recommends that Client remain in IOP treatment to better manage mental health symptoms, stabilization and to address treatment plan goals. Clinician recommends adherence to crisis/safety plan, taking medications as prescribed, and following up with medical professionals if any issues arise.   Follow Up Instructions: Clinician will send Webex link for next session. The Client was advised to call back or seek an in-person evaluation if the symptoms worsen or if the condition fails to improve as anticipated.     I provided 180 minutes of non-face-to-face time during this encounter.     Lise Auer, LCSW

## 2020-03-16 ENCOUNTER — Encounter (HOSPITAL_COMMUNITY): Payer: Self-pay

## 2020-03-16 ENCOUNTER — Other Ambulatory Visit: Payer: Self-pay

## 2020-03-16 ENCOUNTER — Other Ambulatory Visit (HOSPITAL_COMMUNITY): Payer: 59 | Admitting: Psychiatry

## 2020-03-16 DIAGNOSIS — F411 Generalized anxiety disorder: Secondary | ICD-10-CM

## 2020-03-16 DIAGNOSIS — F332 Major depressive disorder, recurrent severe without psychotic features: Secondary | ICD-10-CM

## 2020-03-16 NOTE — Progress Notes (Signed)
Virtual Visit via Telephone Note  I connected with Ashley Land on 03/17/2020 at  9:00 AM EDT by telephone and verified that I am speaking with the correct person using two identifiers.   I discussed the limitations, risks, security and privacy concerns of performing an evaluation and management service by telephone and the availability of in person appointments. I also discussed with the patient that there may be a patient responsible charge related to this service. The patient expressed understanding and agreed to proceed.  I discussed the assessment and treatment plan with the patient. The patient was provided an opportunity to ask questions and all were answered. The patient agreed with the plan and demonstrated an understanding of the instructions.   The patient was advised to call back or seek an in-person evaluation if the symptoms worsen or if the condition fails to improve as anticipated.  I provided 15 minutes of non-face-to-face time during this encounter.   Suella Broad, FNP   [facility] Intensive Outpatient Program Discharge Summary  Ashley Stephens [id number]  Admission date: 06/302021 Discharge date: 03/17/2020  Reason for admission: Ashley Stephens is a 44 y.o. African American female presents with worsening depression.  Patient reports multiple job stressors states she is employed by [employer] and more recently her job has become demanding.  Ashley Stephens reports she continues to struggle with grief and loss due to the passing of her aunties and she reported that she had a brother that committed suicide.  reports she is followed by therapist and psychiatrist at [facility].  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  Reported she was recently initiated on Latuda, Lamictal Xanax and propanolol.  Currently she is denying suicidal or homicidal ideations.  Denies auditory or visual hallucinations. Patient reported that  her nxiety and depression hasn't been controled with medication or therapy, as she has trying multiple medication in the past .  Patient reports she is prescribed patient was enrolled in partial psychiatric program on 02/03/20.   Chemical Use History: Denies  Family of Origin Issues: Pt reports increased depression and anxiety symptoms. Pt sees [name] every 2 weeks for counseling (for over a year) and [name] for medication (since 12-10-2015); both at [place]. Pt denies any attempts or hospitalizations. Pt reports passive SI. Denies HI/AVH. Pt reports the following stressors: 1) Family: A) Pt's aunt, whom was the "glue that held the family together" passed away in 12-10-2018. Another aunt passed in 10-Dec-2019. B) Pt's brother tried to commit suicide over a month ago due to health issues and depression. C) Mother: Pt reports not having a good relationship with mother. D) Pt's family all live on one street and "they're all negative and stay in their bubble. They try to drag me into the bubble." E) Pt's 26yo son is "disrespectful" to pt. 2) Supports: Pt reports she is unable to identify supports. 3) Job: Pt works from home, prepandemic, and feels increase isolation. Pt feels guilt about being on leave causing other co-workers workload to increase. Pt reports struggling to focus and complete tasks when at work. 4) Mental Health: Pt reports she is constantly "fighting the negativity" which is tiring. 5) Grief: The death of 2 aunts recently.  Progress in Program Toward Treatment Goals: Ongoing, patient attended and participated with daily group session with active and engaged participation.  Attributes her coping skills to attending intensive outpatient therapy.  She is denying suicidal or homicidal ideations.  Denies visual hallucinations.She remain complaint on her medications at  this time.  Denying any medication side effects during this assessment  Progress (rationale):  Patient is to follow up with [name], NP for medication management and [name], LCSW for therapy.   Suella Broad, FNP 03/17/2020

## 2020-03-16 NOTE — Progress Notes (Signed)
Virtual Visit via Video Note  I connected with Ashley Stephens on 03/16/20 at  9:00 AM EDT by a video enabled telemedicine application and verified that I am speaking with the correct person using two identifiers.  At orientation to the IOP program Case Manager discussed the limitations of evaluation and management by telemedicine and the availability of in person appointments. The patient expressed understanding and agreed to proceed with virtual visits throughout the duration of the program.   Location:  Patient: Patient Home Provider: Home Office   History of Present Illness: MDD   Observations/Objective: Check In: Case Manager checked in with all participants to review discharge dates, insurance authorizations, work-related documents and needs of the treatment team, including medication review and assessment. Client presents with moderate depression and mild anxiety. Client denied any current SI/HI/psychosis.   Initial Therapeutic Activity: Counselor introduced our guest speaker, Einar Grad, [facility] Pharmacist, who shared about psychiatric medications, side effects, treatment considerations and how to communicate with medical professionals. Group Members asked questions and shared medication concerns. Counselor prompted group members to reference a worksheet called, "Body Scan" to jot down questions and concerns about their physical health in preparation for their upcoming appointments with medical professionals. Client noted and asked questions about medication side effects. Client anticipates an upcoming back surgery to reduce chronic pain and discussed issues with headaches. Counselor encouraged routine medical check-ups, preparing for appointments, following up with recommendations and seeking specialist if needed.   Second Therapeutic Activity: Counselor provided group with psychoeducation on cognitive coping to eliminate cognitive distortions and thought stopping techniques to  combat racing thoughts and ruminations, using 3 articles and providing links at the end for skill building and practice. Client noted that they would like to try muscle isolation the next time they catch themselves in a cognitive distortion or racing thoughts.   Check Out: Counselor closed program by allowing time to celebrate a graduating group member. Counselor shared reflections on progress and allow space for group members to share well wishes and appreciates to the graduating client. Counselor prompted graduating client to share takeaways, reflect on progress and final thoughts for the group.  Assessment and Plan: Clinician recommends that Client remain in IOP treatment to better manage mental health symptoms, stabilization and to address treatment plan goals. Clinician recommends adherence to crisis/safety plan, taking medications as prescribed, and following up with medical professionals if any issues arise.   Follow Up Instructions: Clinician will send Webex link for next session. The Client was advised to call back or seek an in-person evaluation if the symptoms worsen or if the condition fails to improve as anticipated.     I provided 180 minutes of non-face-to-face time during this encounter.     Lise Auer, LCSW

## 2020-03-17 ENCOUNTER — Other Ambulatory Visit: Payer: Self-pay

## 2020-03-17 ENCOUNTER — Encounter (HOSPITAL_COMMUNITY): Payer: Self-pay

## 2020-03-17 ENCOUNTER — Other Ambulatory Visit (HOSPITAL_COMMUNITY): Payer: 59 | Admitting: Psychiatry

## 2020-03-17 DIAGNOSIS — F411 Generalized anxiety disorder: Secondary | ICD-10-CM

## 2020-03-17 DIAGNOSIS — R4589 Other symptoms and signs involving emotional state: Secondary | ICD-10-CM

## 2020-03-17 DIAGNOSIS — F332 Major depressive disorder, recurrent severe without psychotic features: Secondary | ICD-10-CM | POA: Diagnosis not present

## 2020-03-17 NOTE — Patient Instructions (Signed)
D:  Patient completed MH-IOP today.  A:  Follow up with [name], NP and [name], LCSW (patient states she will call both for an appointment.)  Return to work on 03-21-20; without any restrictions.  Encouraged support groups.  R:  Patient receptive.

## 2020-03-17 NOTE — Progress Notes (Signed)
Virtual Visit via Video Note  I connected with Ashley Stephens on @TODAY @ at 0800 by a video enabled telemedicine application and verified that I am speaking with the correct person using two identifiers. I discussed the limitations of evaluation and management by telemedicine and the availability of in person appointments. The patient expressed understanding and agreed to proceed.  I discussed the assessment and treatment plan with the patient. The patient was provided an opportunity to ask questions and all were answered. The patient agreed with the plan and demonstrated an understanding of the instructions.   The patient was advised to call back or seek an in-person evaluation if the symptoms worsen or if the condition fails to improve as anticipated.  I provided 20 minutes of non-face-to-face time during this encounter.   Patient ID: Ashley Stephens, female   DOB: 11/08/1975, 44 y.o.   MRN: [id number] As per previous CCA states: Pt reports per therapist. Pt reports increased depression and anxiety symptoms. Pt sees [name] every 2 weeks for counseling (for over a year) and [name] for medication (since 12/10/15); both at [place]. Pt denies any attempts or hospitalizations. Pt reports passive SI. Denies HI/AVH. Pt reports the following stressors: 1) Family: A) Pt's aunt, whom was the "glue that held the family together"passed away in 12/10/18. Another aunt passed in 12-10-19. B) Pt's brother tried to commit suicide over a month ago due to health issues and depression. C) Mother: Pt reports not having a good relationship with mother. D) Pt's family all live on one street and "they're all negative and stay in their bubble. They try to drag me into the bubble."E) Pt's 27yo son is "disrespectful"to pt. 2) Supports: Pt reports she is unable to identify supports. 3) Job: Pt works from home, prepandemic, and feels increase isolation. Pt feels guilt about being on leave causing other  co-workers workload to increase. Pt reports struggling to focus and complete tasks when at work. 4) Mental Health: Pt reports she is constantly "fighting the negativity"which is tiring. 5) Grief: The death of 2 aunts recently. Patient's Currently Reported Symptoms/Problems: Increased depression ("It feels like I'm suffocating; like I'm drowing. It's sucking the life out of me."); increased anger; increased irritability; fatigue; passive SI; decreased ADLs; feelings of hopelessness/worthlessness/helplessness; weight gain (20lbs); sleep issues; work problems; anhedonia; low energy; excessive worrying;  Patient [facility] from [facility].  Reports that PHP was helpful.  On a scale 1-10 (10 being the worst); pt rates depression a 4 and anxiety a 4.5.  Denies SI/HI or A/V hallucinations.     Pt completed all scheduled MH-IOP days.  Reports feeling better.  "I have enjoyed the groups and have learned a lot of coping skills.  I will be able to use the tools for whenever I return to work because that's where my biggest stress was."  States her goal is to work on her anger.  On a scale of 1-10 (10 being the worst); pt rated her anxiety a 5 and depression a 4.  Denies SI/HI or A/V hallucinations. A:  D/C today.  F/U with [name], NP and [name], LCSW (pt states she will call for f/u appts).  Encouraged support groups.  RTW on 03-21-20; without any restrictions.  R:  Pt receptive.  Dellia Nims, M.Ed,CNA

## 2020-03-17 NOTE — Progress Notes (Signed)
Virtual Visit via Video Note  I connected with Ashley Stephens on 03/17/20 at  9:00 AM EDT by a video enabled telemedicine application and verified that I am speaking with the correct person using two identifiers.  At orientation to the IOP program Case Manager discussed the limitations of evaluation and management by telemedicine and the availability of in person appointments. The patient expressed understanding and agreed to proceed with virtual visits throughout the duration of the program.   Location:  Patient: Patient Home Provider: Home Office   History of Present Illness: MDD   Observations/Objective: Check In: Case Manager checked in with all participants to review discharge dates, insurance authorizations, work-related documents and needs of the treatment team, including medication review and assessment. Client noted one thing enjoyable they did since the last session was going out to eat at a new restaurant, she views this as a hobby and pleasure to explore new things. Client presents with mild depression and mild anxiety. Client denied any current SI/HI/psychosis.   Initial Therapeutic Activity: Counselor introduced [employer], [place] Chaplain to present information and discussion on Grief and Loss. Group members engaged in discussion, sharing how grief impacts them, what comforts them, what emotions are felt, labeling losses, etc. After guest speaker logged off, Counselor prompted group to spend 10-15 minutes journaling to process personal grief and loss situations. Counselor processed entries with group and clients identified areas for additional processing in individual therapy. Client noted how she has learned to cope with loss through questioning and her faith.  Second Therapeutic Activity: Counselor prompted group members to engage in the Inside-Out Mask activity, where we examine how we present on the outside vs what we are feeling on the inside. Counselor allowed  time for creativity and reflection, then prompted group members to share what they created with the group. Client shared that she goes back and forth between feeling "blah" and "I'm good", sharing two selfies with her expressions.   Check Out: Counselor closed program by allowing time to celebrate a graduating group member. Counselor shared reflections on progress and allow space for group members to share well wishes and appreciates to the graduating client. Counselor prompted graduating client to share takeaways, reflect on progress and final thoughts for the group. Client stated that she has learned so much and is in a much healthier place. She stated that she knows the tools and how to use them. She was positively impacted by the kind words and well wishes of others and appreciative to he program for her progress.   Assessment and Plan: Clinician recommends that Client step down to individual therapy and medication management fromIOP treatment to manage mental health symptoms and to reestablish treatment plan goals. Clinician recommends adherence to crisis/safety plan, taking medications as prescribed, and following up with medical professionals if any issues arise.   Follow Up Instructions: Clinician will send Webex link for next session. The Client was advised to call back or seek an in-person evaluation if the symptoms worsen or if the condition fails to improve as anticipated.     I provided 180 minutes of non-face-to-face time during this encounter.     Lise Auer, LCSW

## 2020-03-18 ENCOUNTER — Ambulatory Visit (HOSPITAL_COMMUNITY): Payer: 59

## 2020-03-22 ENCOUNTER — Other Ambulatory Visit: Payer: Self-pay

## 2020-03-22 ENCOUNTER — Ambulatory Visit (HOSPITAL_COMMUNITY): Payer: 59 | Admitting: Psychiatry

## 2020-03-25 ENCOUNTER — Ambulatory Visit (INDEPENDENT_AMBULATORY_CARE_PROVIDER_SITE_OTHER): Payer: 59 | Admitting: Psychiatry

## 2020-03-25 ENCOUNTER — Other Ambulatory Visit: Payer: Self-pay

## 2020-03-25 DIAGNOSIS — R69 Illness, unspecified: Secondary | ICD-10-CM | POA: Diagnosis not present

## 2020-03-25 DIAGNOSIS — F332 Major depressive disorder, recurrent severe without psychotic features: Secondary | ICD-10-CM | POA: Diagnosis not present

## 2020-03-25 NOTE — Progress Notes (Signed)
      [facility] Counselor/Therapist Progress Note  Patient ID: Ashley Stephens, MRN: [id number],    Date: 03/25/2020   Time Spent: 60 minutes  8:05am to 9:05am  Treatment Type: Individual Therapy  Reported Symptoms: anxiety, some depression. Some anxiety in returning to "my normal life after being in IOP"  Mental Status Exam:  Appearance:   Casual     Behavior:  Appropriate, Sharing and Motivated  Motor:  Normal  Speech/Language:   Clear and Coherent  Affect:  anxious  Mood:  anxious  Thought process:  goal directed  Thought content:    WNL  Sensory/Perceptual disturbances:    WNL  Orientation:  oriented to person, place, time/date, situation, day of week, month of year and year  Attention:  Good  Concentration:  Good  Memory:  WNL  Fund of knowledge:   Good  Insight:    Good  Judgment:   Good  Impulse Control:  Good   Risk Assessment: Danger to Self:  No Self-injurious Behavior: No Danger to Others: No Duty to Warn:no Physical Aggression / Violence:No  Access to Firearms a concern: No  Gang Involvement:No   Subjective: Patient today reports anxiety   Interventions: Cognitive Behavioral Therapy and Solution-Oriented/Positive Psychology  Diagnosis:   ICD-10-CM   1. Severe episode of recurrent major depressive disorder, without psychotic features ([place])  F33.2     Plan: Patient not signing tx plan on computer screen due to [place].  Treatment Goals: Patient not signing tx plan on computer screen due to [place].  Long term goal: Develop the ability to recognize, accept, and cope with feelings of depression and anxiety.  Short term goal: Learn and implement personal skills for managing stress, solving daily problems, and resolving conflicts effectively.  Strategy: Teach patient calming strategies, problem solving skills, and conflict resolution skills to better manage daily stressors.  Progressing- Patient in today reporting anxiety primarily.  Depression has been decreasing. Processed a lot of her thoughts/feelings/insights after having been in IOP for past few weeks. Even though it was uncomfortable initially, she really gained a lot from being in group environment. Has been stressful returning to work (even though I work from home).  Still has stressors from extended family but I've been learning that I can set limits with people and determine if I speak with them and when I speak with them.  Cited several examples of new insights and was able to relate positively to some useful quotes in office today.Feels tested by some people re: changes she has made and is continuing to make. Working with long and short term goals, and strategy stated above to supplement what she has learned in IOP and she is to continue this between session. Denies any SI and definitely is working to hang onto some gains made in IOP and showing more motivation and hopefulnes.   Goal review and progress noted with patient.  Next appt within 2 weeks.   [name], LCSW

## 2020-04-01 ENCOUNTER — Telehealth: Payer: Self-pay

## 2020-04-01 NOTE — Telephone Encounter (Signed)
Confirmed and screened for 04-05-20 ov. 

## 2020-04-05 ENCOUNTER — Other Ambulatory Visit: Payer: Self-pay

## 2020-04-05 ENCOUNTER — Ambulatory Visit: Payer: 59 | Admitting: Hospice and Palliative Medicine

## 2020-04-05 ENCOUNTER — Encounter: Payer: Self-pay | Admitting: Nurse Practitioner

## 2020-04-05 DIAGNOSIS — F411 Generalized anxiety disorder: Secondary | ICD-10-CM | POA: Diagnosis not present

## 2020-04-05 DIAGNOSIS — R030 Elevated blood-pressure reading, without diagnosis of hypertension: Secondary | ICD-10-CM | POA: Diagnosis not present

## 2020-04-05 DIAGNOSIS — Z6841 Body Mass Index (BMI) 40.0 and over, adult: Secondary | ICD-10-CM

## 2020-04-05 DIAGNOSIS — R69 Illness, unspecified: Secondary | ICD-10-CM | POA: Diagnosis not present

## 2020-04-05 DIAGNOSIS — R7303 Prediabetes: Secondary | ICD-10-CM | POA: Diagnosis not present

## 2020-04-05 DIAGNOSIS — E1165 Type 2 diabetes mellitus with hyperglycemia: Secondary | ICD-10-CM

## 2020-04-05 LAB — POCT GLYCOSYLATED HEMOGLOBIN (HGB A1C): Hemoglobin A1C: 6 % — AB (ref 4.0–5.6)

## 2020-04-05 MED ORDER — METFORMIN HCL 500 MG PO TABS: ORAL_TABLET | ORAL | 0 refills | Status: DC

## 2020-04-05 NOTE — Progress Notes (Signed)
[facility] [place], [place] [address]  Internal MEDICINE  Office Visit Note  Patient Name: Ashley Stephens  [id number]  [id number]  Date of Service: 04/09/2020  Chief Complaint  Patient presents with  . Follow-up  . Hypertension  . Gastroesophageal Reflux  . Quality Metric Gaps    tdap    HPI Patient is here for routine follow-up for chronic medical conditions of HTN and GERD. Also following up on A1C due to elevated levels found at last check in April. In April A1C was 6.3, plan was for her to focus on diet and exercise and recheck in 3 months. She went to her PMHNP in April and was started on Metformin at that time. Today's A1C was 6.0. Will continue Metformin at this time but will have her take once daily. BP elevated today but recheck improved to 126/78 Discussed with her and answered questions of pre-diabetes as she was concerned about making changes to her Metformin at this time. Reports at first she felt as though she were having some GI upset from Metformin but has much improved and reports not having any issues or further side effects at this time.  Current Medication: Outpatient Encounter Medications as of 04/05/2020  Medication Sig  . ALPRAZolam (XANAX) 0.25 MG tablet Take 1 tablet (0.25 mg total) by mouth 2 (two) times daily as needed. for anxiety  . BIOTIN PO Take by mouth.  . Cyanocobalamin (VITAMIN B 12 PO) Take by mouth.  . Cyanocobalamin 1000 MCG/ML KIT Inject as directed.  . cyclobenzaprine (FLEXERIL) 5 MG tablet   . desvenlafaxine (PRISTIQ) 100 MG 24 hr tablet Take 1 tablet (100 mg total) by mouth every morning.  . desvenlafaxine (PRISTIQ) 50 MG 24 hr tablet Take 1 tablet (50 mg total) by mouth every evening.  . hydrOXYzine (VISTARIL) 50 MG capsule Take 1 capsule by mouth three times daily as needed  . lamoTRIgine (LAMICTAL) 150 MG tablet Take 1 tablet (150 mg total) by mouth 2 (two) times daily.  Marland Kitchen levothyroxine (SYNTHROID) 25  MCG tablet Take 1 tablet (25 mcg total) by mouth daily before breakfast.  . metFORMIN (GLUCOPHAGE) 500 MG tablet Take 1 pill PO daily  . omeprazole (PRILOSEC) 40 MG capsule Take 1 capsule (40 mg total) by mouth 2 (two) times daily before a meal. (Patient taking differently: Take 40 mg by mouth 2 (two) times daily as needed. )  . Oxcarbazepine (TRILEPTAL) 300 MG tablet Take 1 tablet (300 mg total) by mouth at bedtime.  . sucralfate (CARAFATE) 1 GM/10ML suspension Take 10 mLs (1 g total) by mouth 4 (four) times daily -  with meals and at bedtime.  . Vitamin D, Ergocalciferol, (DRISDOL) 1.25 MG (50000 UT) CAPS capsule Take 1 capsule (50,000 Units total) by mouth every 7 (seven) days.  . [DISCONTINUED] metFORMIN (GLUCOPHAGE) 500 MG tablet Take 1 tab po qd x 1 week, then increase to 1 po BID as tolerated  . lurasidone (LATUDA) 80 MG TABS tablet Take 1 tablet (80 mg total) by mouth daily with breakfast.  . propranolol (INDERAL) 20 MG tablet Take 1 tablet (20 mg total) by mouth 3 (three) times daily as needed.   No facility-administered encounter medications on file as of 04/05/2020.    Surgical History: Past Surgical History:  Procedure Laterality Date  . ABDOMINAL HYSTERECTOMY  2013  . BARIATRIC SURGERY    . BREAST BIOPSY  1995  . GASTRIC BYPASS  2012  . [place]  Medical History: Past Medical History:  Diagnosis Date  . Anxiety   . Depression   . Gastric ulcer   . GERD (gastroesophageal reflux disease)   . Hypertension   . Osteoarthritis   . Thyroid disorder     Family History: Family History  Problem Relation Age of Onset  . Breast cancer Maternal Grandmother     Social History   Socioeconomic History  . Marital status: Single    Spouse name: Not on file  . Number of children: Not on file  . Years of education: Not on file  . Highest education level: Not on file  Occupational History  . Not on file  Tobacco Use  . Smoking status: Never Smoker  . Smokeless  tobacco: Never Used  Vaping Use  . Vaping Use: Never used  Substance and Sexual Activity  . Alcohol use: No    Alcohol/week: 0.0 standard drinks  . Drug use: No  . Sexual activity: Yes    Birth control/protection: Surgical  Other Topics Concern  . Not on file  Social History Narrative  . Not on file   Social Determinants of Health   Financial Resource Strain:   . Difficulty of Paying Living Expenses:   Food Insecurity:   . Worried About Charity fundraiser in the Last Year:   . Arboriculturist in the Last Year:   Transportation Needs:   . Film/video editor (Medical):   Marland Kitchen Lack of Transportation (Non-Medical):   Physical Activity:   . Days of Exercise per Week:   . Minutes of Exercise per Session:   Stress:   . Feeling of Stress :   Social Connections:   . Frequency of Communication with Friends and Family:   . Frequency of Social Gatherings with Friends and Family:   . Attends Religious Services:   . Active Member of Clubs or Organizations:   . Attends Archivist Meetings:   Marland Kitchen Marital Status:   Intimate Partner Violence:   . Fear of Current or Ex-Partner:   . Emotionally Abused:   Marland Kitchen Physically Abused:   . Sexually Abused:     Review of Systems  Constitutional: Negative.        For chills, fever, fatigue.  HENT: Negative.        For sinus pain, sinus pressure, sore throat, trouble swallowing.  Eyes: Negative.        For changes in vision or visual disturbances.  Respiratory: Negative.        For chest tightness, cough, shortness of breath, wheezing.  Cardiovascular: Negative.        For chest pain, ankle swelling, palpitations.  Gastrointestinal: Negative.        For abdominal pain, constipation, nausea, vomiting, diarrhea.  Endocrine: Negative.        For polydipsia, polyphagia, polyuria.  Genitourinary: Negative.        For dysuria, flank pain, hematuria, increased frequency, urgency.  Musculoskeletal: Negative.        For arthralgias,  myalgias, back pain, neck pain, gait disturbances.  Skin: Negative.        For rash, wound.  Allergic/Immunologic: Negative.   Neurological: Negative.        For dizziness, headaches, tremors, weakness.  Hematological: Negative.   Psychiatric/Behavioral: Negative.        For confusion, depression, anxiety, sleep disturbances.    Vital Signs: BP 139/90   Pulse 97   Temp (!) 97.5 F (36.4 C)  Resp 16   Ht 5' 7"  (1.702 m)   Wt 274 lb 6.4 oz (124.5 kg)   SpO2 99%   BMI 42.98 kg/m    Physical Exam Constitutional:      Appearance: Normal appearance. She is obese.  HENT:     Mouth/Throat:     Mouth: Mucous membranes are moist.     Pharynx: Oropharynx is clear.  Cardiovascular:     Rate and Rhythm: Normal rate and regular rhythm.     Pulses: Normal pulses.     Heart sounds: Normal heart sounds.  Pulmonary:     Effort: Pulmonary effort is normal.     Breath sounds: Normal breath sounds.  Abdominal:     General: Abdomen is flat. Bowel sounds are normal.     Palpations: Abdomen is soft.  Musculoskeletal:        General: Normal range of motion.     Cervical back: Normal range of motion.  Skin:    General: Skin is warm and dry.  Neurological:     General: No focal deficit present.     Mental Status: She is alert and oriented to person, place, and time. Mental status is at baseline.  Psychiatric:        Mood and Affect: Mood normal.        Behavior: Behavior normal.        Thought Content: Thought content normal.     Assessment/Plan: 1. Prediabetes A1C today 6.0, improved from previous check. Continue with Metformin at this time but will drop to taking once daily. Will recheck A1C in 3 months at next follow-up. Educated on signs and symptoms of hyper- and hypoglycemia. - POCT HgB A1C - metFORMIN (GLUCOPHAGE) 500 MG tablet; Take 1 pill PO daily  Dispense: 90 tablet; Refill: 0  2. Morbid obesity with BMI of 40.0-44.9, adult [facility]) Discussed with her the importance of  weight loss. Discussed obesity having negative side effects on her health such as diabetes and HTN. Encouraged her to begin diet plan and routine exercise.  3. Generalized anxiety disorder Stable at this time, continue with current therapy.  4. Elevated BP without diagnosis of hypertension Initial BP elevated, rechecked BP much improved. Will need to continue to monitor BP and encourage her to monitor BP at home and keep log to bring to next follow-up appointments.  General Counseling: Lorell verbalizes understanding of the findings of todays visit and agrees with plan of treatment. I have discussed any further diagnostic evaluation that may be needed or ordered today. We also reviewed her medications today. she has been encouraged to call the office with any questions or concerns that should arise related to todays visit.    Orders Placed This Encounter  Procedures  . POCT HgB A1C    Meds ordered this encounter  Medications  . metFORMIN (GLUCOPHAGE) 500 MG tablet    Sig: Take 1 pill PO daily    Dispense:  90 tablet    Refill:  0    Total time spent: 35 Minutes  This patient was seen by Theodoro Grist, AGNP-C in collaboration with Dr. Lavera Guise as part of a collaborative care agreement.  Time spent includes review of chart, medications, test results, and follow up plan with the patient.   Theodoro Grist, AGNP-C  Dr Lavera Guise Internal medicine

## 2020-04-07 DIAGNOSIS — M47816 Spondylosis without myelopathy or radiculopathy, lumbar region: Secondary | ICD-10-CM | POA: Diagnosis not present

## 2020-04-11 ENCOUNTER — Ambulatory Visit (INDEPENDENT_AMBULATORY_CARE_PROVIDER_SITE_OTHER): Payer: 59 | Admitting: Psychiatry

## 2020-04-11 ENCOUNTER — Other Ambulatory Visit: Payer: Self-pay

## 2020-04-11 ENCOUNTER — Encounter: Payer: Self-pay | Admitting: Psychiatry

## 2020-04-11 DIAGNOSIS — R69 Illness, unspecified: Secondary | ICD-10-CM | POA: Diagnosis not present

## 2020-04-11 DIAGNOSIS — F411 Generalized anxiety disorder: Secondary | ICD-10-CM

## 2020-04-11 DIAGNOSIS — F5101 Primary insomnia: Secondary | ICD-10-CM

## 2020-04-11 DIAGNOSIS — F39 Unspecified mood [affective] disorder: Secondary | ICD-10-CM

## 2020-04-11 DIAGNOSIS — R7303 Prediabetes: Secondary | ICD-10-CM | POA: Diagnosis not present

## 2020-04-11 MED ORDER — HYDROXYZINE PAMOATE 50 MG PO CAPS: ORAL_CAPSULE | ORAL | 1 refills | Status: DC

## 2020-04-11 MED ORDER — METFORMIN HCL 500 MG PO TABS: 500.0000 mg | ORAL_TABLET | Freq: Two times a day (BID) | ORAL | 0 refills | Status: DC

## 2020-04-11 MED ORDER — PROPRANOLOL HCL 20 MG PO TABS: 20.0000 mg | ORAL_TABLET | Freq: Three times a day (TID) | ORAL | 1 refills | Status: DC | PRN

## 2020-04-11 MED ORDER — LURASIDONE HCL 80 MG PO TABS: 80.0000 mg | ORAL_TABLET | Freq: Every day | ORAL | 3 refills | Status: DC

## 2020-04-11 MED ORDER — OXCARBAZEPINE 300 MG PO TABS: ORAL_TABLET | ORAL | 0 refills | Status: DC

## 2020-04-11 MED ORDER — ALPRAZOLAM 0.25 MG PO TABS: 0.2500 mg | ORAL_TABLET | Freq: Two times a day (BID) | ORAL | 2 refills | Status: DC | PRN

## 2020-04-11 MED ORDER — DESVENLAFAXINE SUCCINATE ER 50 MG PO TB24: 50.0000 mg | ORAL_TABLET | Freq: Every evening | ORAL | 1 refills | Status: DC

## 2020-04-11 MED ORDER — DESVENLAFAXINE SUCCINATE ER 100 MG PO TB24: 100.0000 mg | ORAL_TABLET | ORAL | 1 refills | Status: DC

## 2020-04-11 NOTE — Progress Notes (Signed)
Ashley Stephens [id number] Oct 18, 1975 44 y.o.  Subjective:   Patient ID:  Ashley Stephens is a 35 y.o. (DOB Nov 28, 1975) female.  Chief Complaint:  Chief Complaint  Patient presents with  . Anxiety  . Follow-up    Mood disturbance    HPI Ashley Stephens presents to the office today for follow-up of anxiety, mood disturbance, and insomnia. She reports that OP program was helpful for her and continues to see [name], LCSW for therapy. She reports that she returned to work 03/21/20. She reports that her anxiety has been 'through the roof," particularly with work. Reports that she feels "on edge." Occasional panic s/s. She reports that her mood "depends on the moment and the situation." She reports that she has some depression "but it's not as prevalent as the anxiety." She reports that she has steady, low level irritability- "I'm just trying to control it." Denies any manic s/s. Denies impulsivity or risky behavior. She reports that her sleep has been up and down. She reports that she has been "eating to eat" and eating more to calm pain. Energy is low. Motivation is low and reports that she has to push herself. S he reports that she often wants to stay in bed. She reports that she has to force herself to concentrate. Reports occasional fleeting passive death wishes. Denies SI.   She reports that when she feels bad physically that it has a negative impact on her mental health.   Works Sunday-Thursday.   Taking Xanax prn at night.   Past medication trials: Wellbutrin Effexor Pristiq-effective Lamictal-effective for mood Latuda Xanax- Usually takes at night Propanolol- Effective Hydroxyzine Rexulti Gabapentin- prescribed for back pain. Dizziness, headaches.  Lithium Trileptal   AIMS     Office Visit from 04/11/2020 in [place] Visit from 11/27/2019 in [place] Visit from 08/14/2019 in [place] Visit from 05/29/2019 in [place] Total Score 0 0 0 0    GAD-7     Office Visit from 12/25/2019 in [place]  Total GAD-7 Score 16    PHQ2-9     Office Visit from 04/05/2020 in [facility], [facility] Office Visit from 12/25/2019 in [place] Visit from 12/03/2019 in [facility], [facility] Office Visit from 06/26/2019 in [facility], [facility] Office Visit from 10/03/2018 in [facility], [facility]  PHQ-2 Total Score 0 5 0 0 0  PHQ-9 Total Score -- 18 -- -- --       Review of Systems:  Review of Systems  Gastrointestinal: Positive for abdominal pain.  Musculoskeletal: Positive for back pain. Negative for gait problem.  Neurological: Negative for tremors.  Psychiatric/Behavioral:       Please refer to HPI    Medications: I have reviewed the patient's current medications.  Current Outpatient Medications  Medication Sig Dispense Refill  . ALPRAZolam (XANAX) 0.25 MG tablet Take 1 tablet (0.25 mg total) by mouth 2 (two) times daily as needed. for anxiety 45 tablet 2  . BIOTIN PO Take by mouth.    . cholecalciferol (VITAMIN D3) 25 MCG (1000 UNIT) tablet Take 1,000 Units by mouth daily.    . Cyanocobalamin (VITAMIN B 12 PO) Take by mouth.    . desvenlafaxine (PRISTIQ) 100 MG 24 hr tablet Take 1 tablet (100 mg total) by mouth every morning. 90 tablet 1  . desvenlafaxine (PRISTIQ) 50 MG 24 hr tablet Take 1 tablet (50 mg  total) by mouth every evening. 90 tablet 1  . hydrOXYzine (VISTARIL) 50 MG capsule Take 1 capsule by mouth three times daily as needed 90 capsule 1  . lamoTRIgine (LAMICTAL) 150 MG tablet Take 1 tablet (150 mg total) by mouth 2 (two) times daily. 180 tablet 1  . levothyroxine (SYNTHROID) 25 MCG tablet Take 1 tablet (25 mcg total) by mouth daily before breakfast. 90 tablet 2  . metFORMIN (GLUCOPHAGE) 500 MG tablet Take 1 tablet (500 mg total) by mouth 2 (two) times daily  with a meal. 180 tablet 0  . omeprazole (PRILOSEC) 40 MG capsule Take 1 capsule (40 mg total) by mouth 2 (two) times daily before a meal. (Patient taking differently: Take 40 mg by mouth 2 (two) times daily as needed. ) 30 capsule 3  . Oxcarbazepine (TRILEPTAL) 300 MG tablet Take 0.5 tablets (150 mg total) by mouth every morning AND 1 tablet (300 mg total) at bedtime. 135 tablet 0  . cyclobenzaprine (FLEXERIL) 5 MG tablet  (Patient not taking: Reported on 04/11/2020)    . lurasidone (LATUDA) 80 MG TABS tablet Take 1 tablet (80 mg total) by mouth daily with breakfast. 30 tablet 3  . propranolol (INDERAL) 20 MG tablet Take 1 tablet (20 mg total) by mouth 3 (three) times daily as needed. 270 tablet 1  . sucralfate (CARAFATE) 1 GM/10ML suspension Take 10 mLs (1 g total) by mouth 4 (four) times daily -  with meals and at bedtime. (Patient not taking: Reported on 04/11/2020) 420 mL 0   No current facility-administered medications for this visit.    Medication Side Effects: None  Allergies: No Known Allergies  Past Medical History:  Diagnosis Date  . Anxiety   . Depression   . Gastric ulcer   . GERD (gastroesophageal reflux disease)   . Hypertension   . Osteoarthritis   . Thyroid disorder     Family History  Problem Relation Age of Onset  . Breast cancer Maternal Grandmother     Social History   Socioeconomic History  . Marital status: Single    Spouse name: Not on file  . Number of children: Not on file  . Years of education: Not on file  . Highest education level: Not on file  Occupational History  . Not on file  Tobacco Use  . Smoking status: Never Smoker  . Smokeless tobacco: Never Used  Vaping Use  . Vaping Use: Never used  Substance and Sexual Activity  . Alcohol use: No    Alcohol/week: 0.0 standard drinks  . Drug use: No  . Sexual activity: Yes    Birth control/protection: Surgical  Other Topics Concern  . Not on file  Social History Narrative  . Not on file    Social Determinants of Health   Financial Resource Strain:   . Difficulty of Paying Living Expenses:   Food Insecurity:   . Worried About Charity fundraiser in the Last Year:   . Arboriculturist in the Last Year:   Transportation Needs:   . Film/video editor (Medical):   Marland Kitchen Lack of Transportation (Non-Medical):   Physical Activity:   . Days of Exercise per Week:   . Minutes of Exercise per Session:   Stress:   . Feeling of Stress :   Social Connections:   . Frequency of Communication with Friends and Family:   . Frequency of Social Gatherings with Friends and Family:   . Attends Religious Services:   .  Active Member of Clubs or Organizations:   . Attends Archivist Meetings:   Marland Kitchen Marital Status:   Intimate Partner Violence:   . Fear of Current or Ex-Partner:   . Emotionally Abused:   Marland Kitchen Physically Abused:   . Sexually Abused:     Past Medical History, Surgical history, Social history, and Family history were reviewed and updated as appropriate.   Please see review of systems for further details on the patient's review from today.   Objective:   Physical Exam:  BP 105/69   Pulse 73   Wt 274 lb (124.3 kg)   BMI 42.91 kg/m   Physical Exam Constitutional:      General: She is not in acute distress. Musculoskeletal:        General: No deformity.  Neurological:     Mental Status: She is alert and oriented to person, place, and time.     Coordination: Coordination normal.  Psychiatric:        Attention and Perception: Attention and perception normal. She does not perceive auditory or visual hallucinations.        Mood and Affect: Mood is anxious and depressed. Affect is not labile, blunt, angry or inappropriate.        Speech: Speech normal.        Behavior: Behavior normal.        Thought Content: Thought content normal. Thought content is not paranoid or delusional. Thought content does not include homicidal or suicidal ideation. Thought content does  not include homicidal or suicidal plan.        Cognition and Memory: Cognition and memory normal.        Judgment: Judgment normal.     Comments: Insight intact     Lab Review:     Component Value Date/Time   NA 139 07/10/2019 0953   NA 142 12/03/2011 1059   K 5.0 07/10/2019 0953   K 3.4 (L) 12/03/2011 1059   CL 101 07/10/2019 0953   CL 107 12/03/2011 1059   CO2 24 07/10/2019 0953   CO2 27 12/03/2011 1059   GLUCOSE 82 07/10/2019 0953   GLUCOSE 105 (H) 02/05/2018 1045   GLUCOSE 66 12/03/2011 1059   BUN 9 07/10/2019 0953   BUN 4 (L) 12/03/2011 1059   CREATININE 1.01 (H) 07/10/2019 0953   CREATININE 0.82 12/03/2011 1059   CALCIUM 9.5 07/10/2019 0953   CALCIUM 8.5 12/03/2011 1059   PROT 6.5 07/10/2019 0953   ALBUMIN 4.2 07/10/2019 0953   AST 17 07/10/2019 0953   ALT 12 07/10/2019 0953   ALKPHOS 80 07/10/2019 0953   BILITOT <0.2 07/10/2019 0953   GFRNONAA 67 07/10/2019 0953   GFRNONAA >60 12/03/2011 1059   GFRAA 78 07/10/2019 0953   GFRAA >60 12/03/2011 1059       Component Value Date/Time   WBC 4.9 07/10/2019 0953   WBC 4.7 02/05/2018 1045   RBC 4.77 07/10/2019 0953   RBC 4.65 02/05/2018 1045   HGB 13.5 07/10/2019 0953   HCT 41.0 07/10/2019 0953   PLT 340 07/10/2019 0953   MCV 86 07/10/2019 0953   MCH 28.3 07/10/2019 0953   MCH 28.8 02/05/2018 1045   MCHC 32.9 07/10/2019 0953   MCHC 33.9 02/05/2018 1045   RDW 14.0 07/10/2019 0953   LYMPHSABS 1.4 02/05/2018 1045   LYMPHSABS 1.8 12/27/2017 0740   MONOABS 0.4 02/05/2018 1045   EOSABS 0.1 02/05/2018 1045   EOSABS 0.1 12/27/2017 0740   BASOSABS 0.0 02/05/2018 1045  BASOSABS 0.0 12/27/2017 0740    No results found for: POCLITH, LITHIUM   No results found for: PHENYTOIN, PHENOBARB, VALPROATE, CBMZ   .res Assessment: Plan:   Pt reports that anxiety is currently her chief complaint. Discussed tx options for anxiety, including potential benefits, risks, and side effects of increasing Trileptal since this can be  helpful for anxiety and irritability. Pt agrees to increase in Trileptal. Will increase Trileptal to 300 mg 1/2 tab po q am and 1 tab po QHS for anxiety and mood stabilization.  Reviewed response to Metformin for anti-psychotic induced wt gain/insulin resistance. Discussed that wt had been gradually increasing and that wt gain reached a plateau after starting Metformin. Discussed that Hgb A1C has decreased from 6.3 to 6.0 since starting Metformin. Will continue Metformin 500 mg po BID.  Continue all other medications as prescribed.  Recommend continuing therapy with [name], LCSW. Pt to f/u in 4 weeks or sooner if clinically indicated.  Patient advised to contact office with any questions, adverse effects, or acute worsening in signs and symptoms.  Elissia was seen today for anxiety and follow-up.  Diagnoses and all orders for this visit:  Mood disorder ([place]) -     Oxcarbazepine (TRILEPTAL) 300 MG tablet; Take 0.5 tablets (150 mg total) by mouth every morning AND 1 tablet (300 mg total) at bedtime. -     desvenlafaxine (PRISTIQ) 100 MG 24 hr tablet; Take 1 tablet (100 mg total) by mouth every morning. -     desvenlafaxine (PRISTIQ) 50 MG 24 hr tablet; Take 1 tablet (50 mg total) by mouth every evening. -     lurasidone (LATUDA) 80 MG TABS tablet; Take 1 tablet (80 mg total) by mouth daily with breakfast.  Generalized anxiety disorder -     Oxcarbazepine (TRILEPTAL) 300 MG tablet; Take 0.5 tablets (150 mg total) by mouth every morning AND 1 tablet (300 mg total) at bedtime. -     ALPRAZolam (XANAX) 0.25 MG tablet; Take 1 tablet (0.25 mg total) by mouth 2 (two) times daily as needed. for anxiety -     desvenlafaxine (PRISTIQ) 100 MG 24 hr tablet; Take 1 tablet (100 mg total) by mouth every morning. -     desvenlafaxine (PRISTIQ) 50 MG 24 hr tablet; Take 1 tablet (50 mg total) by mouth every evening. -     propranolol (INDERAL) 20 MG tablet; Take 1 tablet (20 mg total) by mouth 3 (three) times  daily as needed.  Primary insomnia -     Oxcarbazepine (TRILEPTAL) 300 MG tablet; Take 0.5 tablets (150 mg total) by mouth every morning AND 1 tablet (300 mg total) at bedtime. -     hydrOXYzine (VISTARIL) 50 MG capsule; Take 1 capsule by mouth three times daily as needed  Prediabetes -     metFORMIN (GLUCOPHAGE) 500 MG tablet; Take 1 tablet (500 mg total) by mouth 2 (two) times daily with a meal.     Please see After Visit Summary for patient specific instructions.  Future Appointments  Date Time Provider [place]  04/29/2020  8:00 AM [name], LCSW [facility] None  05/12/2020  8:30 AM [name], PMHNP [facility] None  05/13/2020  8:00 AM [name], LCSW [facility] None  05/27/2020  8:00 AM [name], LCSW [facility] None  07/07/2020  9:00 AM Ronnell Freshwater, NP [facility] None    No orders of the defined types were placed in this encounter.   -------------------------------

## 2020-04-11 NOTE — Progress Notes (Signed)
   04/11/20 0845  Facial and Oral Movements  Muscles of Facial Expression 0  Lips and Perioral Area 0  Jaw 0  Tongue 0  Extremity Movements  Upper (arms, wrists, hands, fingers) 0  Lower (legs, knees, ankles, toes) 0  Trunk Movements  Neck, shoulders, hips 0  Overall Severity  Severity of abnormal movements (highest score from questions above) 0  Incapacitation due to abnormal movements 0  Patient's awareness of abnormal movements (rate only patient's report) 0  AIMS Total Score  AIMS Total Score 0

## 2020-04-12 ENCOUNTER — Telehealth: Payer: Self-pay | Admitting: Psychiatry

## 2020-04-12 NOTE — Telephone Encounter (Signed)
Received FMLA paperwork from [employer]. Given to nurse.

## 2020-04-14 DIAGNOSIS — Z0289 Encounter for other administrative examinations: Secondary | ICD-10-CM

## 2020-04-14 NOTE — Telephone Encounter (Signed)
Contacted patient in regards to her FMLA paperwork, this will cover for her intermittent leave as previously completed. She returned back to work on 03/21/2020. Forms completed and given to Janett Billow to review and sign.

## 2020-04-21 DIAGNOSIS — L74510 Primary focal hyperhidrosis, axilla: Secondary | ICD-10-CM | POA: Diagnosis not present

## 2020-04-21 DIAGNOSIS — L700 Acne vulgaris: Secondary | ICD-10-CM | POA: Diagnosis not present

## 2020-04-29 ENCOUNTER — Other Ambulatory Visit: Payer: Self-pay

## 2020-04-29 ENCOUNTER — Ambulatory Visit (INDEPENDENT_AMBULATORY_CARE_PROVIDER_SITE_OTHER): Payer: 59 | Admitting: Psychiatry

## 2020-04-29 DIAGNOSIS — F39 Unspecified mood [affective] disorder: Secondary | ICD-10-CM

## 2020-04-29 DIAGNOSIS — R69 Illness, unspecified: Secondary | ICD-10-CM | POA: Diagnosis not present

## 2020-04-29 DIAGNOSIS — N3000 Acute cystitis without hematuria: Secondary | ICD-10-CM | POA: Diagnosis not present

## 2020-04-29 NOTE — Progress Notes (Signed)
[facility] Counselor/Therapist Progress Note  Patient ID: Ashley Stephens, MRN: [id number],    Date: 04/29/2020  Time Spent: 48 minutes  8:00am to 8:48am   Treatment Type: Individual Therapy  Reported Symptoms: anxiety, some tearfulness, some difficulty "holding on" to newer ways of thinking and seeing herself, stressed   Mental Status Exam:  Appearance:   Neat     Behavior:  Appropriate and Sharing  Motor:  Normal  Speech/Language:   Clear and Coherent  Affect:  anxious, some tearfulness  Mood:  anxious  Thought process:  normal  Thought content:    WNL  Sensory/Perceptual disturbances:    WNL  Orientation:  oriented to person, place, time/date, situation, day of week, month of year and year  Attention:  Good  Concentration:  Good  Memory:  "my memory is good when I take my Pristiq meds"  Fund of knowledge:   Good  Insight:    Fair  Judgment:   Good and Fair  Impulse Control:  Fair   Risk Assessment: Danger to Self:  No Self-injurious Behavior: No Danger to Others: No Duty to Warn:no Physical Aggression / Violence:No  Access to Firearms a concern: No  Gang Involvement:No   Subjective: Patient today reports at work she is doing good, but "not taking good care of myself", by "not putting myself first and not focusing on me"  Interventions: Cognitive Behavioral Therapy and Solution-Oriented/Positive Psychology  Diagnosis:   ICD-10-CM   1. Mood disorder ([place])  F39     Plan: Patient not signing tx plan on computer screen due to [place].  Treatment Goals: Patient not signing tx plan on computer screen due to [place].  Long term goal: Develop the ability to recognize, accept, and cope with feelings of depression and anxiety.  Short term goal: Learn and implement personal skills for managing stress, solving daily problems, and resolving conflicts effectively.  Strategy: Teach patient calming strategies, problem solving skills, and conflict  resolution skills to better manage daily stressors.  Progressing- Patient in today more anxious, denies depression, but some tearfulness saying "I'm not taking care of me and putting me first." States "I know I have to make myself more of a priority and stop doing so much for others, so I can feel better and be relieved of so much stress."  Denies any SI.  Some tearfulness, that she reports is more stress-related.  Reports stress at work and that she actually creates this by "overly expecting being perfect on her job".  Discussed some strategies for being more realistic with her self and setting appropriate limits, which will actually probably help her do a better job than when she strives for perfection.  States that she has had some headaches and that she thinks it is related to when she takes her Pristiq medication.  She plans to leave a message with the nurse as she checks out today.  Patient shared that she is scheduled on September 24 to have her "first laser liposuction surgery and her understanding is that the recovery is approximately 3 days".  Discussed with her more positive self-care and encouraged her to practice some of the strategies we spoke about in prior sessions but also focus more on taking some breaks as she can during each day, not always feeling like she has to answer immediately when she gets phone calls or text messages from others, getting outside some each day, setting limits with her adult children, healthy nutrition, taking medication as prescribed,  and working to make her self talk more positive.  Goal review and progress/challenges noted with patient.  Next appointment within 2 weeks.   [name], LCSW

## 2020-05-11 DIAGNOSIS — M47816 Spondylosis without myelopathy or radiculopathy, lumbar region: Secondary | ICD-10-CM | POA: Diagnosis not present

## 2020-05-12 ENCOUNTER — Encounter: Payer: Self-pay | Admitting: Psychiatry

## 2020-05-12 ENCOUNTER — Other Ambulatory Visit: Payer: Self-pay

## 2020-05-12 ENCOUNTER — Ambulatory Visit (INDEPENDENT_AMBULATORY_CARE_PROVIDER_SITE_OTHER): Payer: 59 | Admitting: Psychiatry

## 2020-05-12 DIAGNOSIS — R69 Illness, unspecified: Secondary | ICD-10-CM | POA: Diagnosis not present

## 2020-05-12 DIAGNOSIS — F39 Unspecified mood [affective] disorder: Secondary | ICD-10-CM | POA: Diagnosis not present

## 2020-05-12 MED ORDER — LAMOTRIGINE 150 MG PO TABS: 150.0000 mg | ORAL_TABLET | Freq: Two times a day (BID) | ORAL | 1 refills | Status: DC

## 2020-05-12 NOTE — Progress Notes (Signed)
Ashley Stephens [id number] November 10, 1975 44 y.o.  Subjective:   Patient ID:  Ashley Stephens is a 44 y.o. (DOB 1975-09-30) female.  Chief Complaint:  Chief Complaint  Patient presents with  . Medication Problem    HPI Ashley Stephens presents to the office today for follow-up of anxiety, mood disturbance, and insomnia. She reports that she is being awakened by a headache between 5-7 am that is similar to the headache she experiences with late or missed doses of Pristiq. She reports that she typically takes evening dose of Pristiq 50 mg between 7-8 pm. She reports that it she takes it later, around 8:15 pm she is not awakened with a headache. She notices an immediate improvement in headache when she takes Pristiq.  She reports that her mood has been "ok." Denies elevated mood. She reports that she is using strategies that she has learned from group therapy to help cope with situational stress. She reports that she is now better able to apply strategies that she has learned. She reports that she continues to deal with multiple stressors with family and work. Energy "comes and goes." She reports that on her days off she will rest. Denies panic attacks. Appetite has been ok. Occasional food cravings. Concentration is challenging and "forcing it at work." Denies SI.     AIMS     Office Visit from 04/11/2020 in [place] Visit from 11/27/2019 in [place] Visit from 08/14/2019 in [place] Visit from 05/29/2019 in [place] Total Score 0 0 0 0    GAD-7     Office Visit from 12/25/2019 in [place]  Total GAD-7 Score 16    PHQ2-9     Office Visit from 04/05/2020 in [facility], [facility] Office Visit from 12/25/2019 in [place] Visit from 12/03/2019 in [facility], [place] from 06/26/2019 in [facility], [facility] Office Visit from 10/03/2018 in [facility], [facility]  PHQ-2 Total Score 0 5 0 0 0  PHQ-9 Total Score -- 18 -- -- --       Review of Systems:  Review of Systems  Gastrointestinal: Positive for abdominal pain.  Musculoskeletal: Negative for gait problem.  Neurological: Positive for headaches.  Psychiatric/Behavioral:       Please refer to HPI    Medications: I have reviewed the patient's current medications.  Current Outpatient Medications  Medication Sig Dispense Refill  . ALPRAZolam (XANAX) 0.25 MG tablet Take 1 tablet (0.25 mg total) by mouth 2 (two) times daily as needed. for anxiety 45 tablet 2  . BIOTIN PO Take by mouth.    . cholecalciferol (VITAMIN D3) 25 MCG (1000 UNIT) tablet Take 1,000 Units by mouth daily.    . Cyanocobalamin (VITAMIN B 12 PO) Take by mouth.    . desvenlafaxine (PRISTIQ) 100 MG 24 hr tablet Take 1 tablet (100 mg total) by mouth every morning. 90 tablet 1  . desvenlafaxine (PRISTIQ) 50 MG 24 hr tablet Take 1 tablet (50 mg total) by mouth every evening. 90 tablet 1  . hydrOXYzine (VISTARIL) 50 MG capsule Take 1 capsule by mouth three times daily as needed 90 capsule 1  . lamoTRIgine (LAMICTAL) 150 MG tablet Take 1 tablet (150 mg total) by mouth 2 (two) times daily. 180 tablet 1  . levothyroxine (SYNTHROID) 25 MCG tablet Take 1 tablet (25 mcg total) by mouth daily before breakfast. 90 tablet 2  .  metFORMIN (GLUCOPHAGE) 500 MG tablet Take 1 tablet (500 mg total) by mouth 2 (two) times daily with a meal. 180 tablet 0  . omeprazole (PRILOSEC) 40 MG capsule Take 1 capsule (40 mg total) by mouth 2 (two) times daily before a meal. (Patient taking differently: Take 40 mg by mouth 2 (two) times daily as needed. ) 30 capsule 3  . Oxcarbazepine (TRILEPTAL) 300 MG tablet Take 0.5 tablets (150 mg total) by mouth every morning AND 1 tablet (300 mg total) at bedtime. 135 tablet 0  . propranolol (INDERAL) 20 MG tablet Take 1 tablet (20 mg total) by  mouth 3 (three) times daily as needed. 270 tablet 1  . cyclobenzaprine (FLEXERIL) 5 MG tablet  (Patient not taking: Reported on 04/11/2020)    . lurasidone (LATUDA) 80 MG TABS tablet Take 1 tablet (80 mg total) by mouth daily with breakfast. 30 tablet 3  . sucralfate (CARAFATE) 1 GM/10ML suspension Take 10 mLs (1 g total) by mouth 4 (four) times daily -  with meals and at bedtime. (Patient not taking: Reported on 04/11/2020) 420 mL 0   No current facility-administered medications for this visit.    Medication Side Effects: Other: Headaches prior to next dose  Allergies: No Known Allergies  Past Medical History:  Diagnosis Date  . Anxiety   . Depression   . Gastric ulcer   . GERD (gastroesophageal reflux disease)   . Hypertension   . Osteoarthritis   . Thyroid disorder     Family History  Problem Relation Age of Onset  . Breast cancer Maternal Grandmother     Social History   Socioeconomic History  . Marital status: Single    Spouse name: Not on file  . Number of children: Not on file  . Years of education: Not on file  . Highest education level: Not on file  Occupational History  . Not on file  Tobacco Use  . Smoking status: Never Smoker  . Smokeless tobacco: Never Used  Vaping Use  . Vaping Use: Never used  Substance and Sexual Activity  . Alcohol use: No    Alcohol/week: 0.0 standard drinks  . Drug use: No  . Sexual activity: Yes    Birth control/protection: Surgical  Other Topics Concern  . Not on file  Social History Narrative  . Not on file   Social Determinants of Health   Financial Resource Strain:   . Difficulty of Paying Living Expenses: Not on file  Food Insecurity:   . Worried About Charity fundraiser in the Last Year: Not on file  . Ran Out of Food in the Last Year: Not on file  Transportation Needs:   . Lack of Transportation (Medical): Not on file  . Lack of Transportation (Non-Medical): Not on file  Physical Activity:   . Days of Exercise  per Week: Not on file  . Minutes of Exercise per Session: Not on file  Stress:   . Feeling of Stress : Not on file  Social Connections:   . Frequency of Communication with Friends and Family: Not on file  . Frequency of Social Gatherings with Friends and Family: Not on file  . Attends Religious Services: Not on file  . Active Member of Clubs or Organizations: Not on file  . Attends Archivist Meetings: Not on file  . Marital Status: Not on file  Intimate Partner Violence:   . Fear of Current or Ex-Partner: Not on file  . Emotionally Abused: Not  on file  . Physically Abused: Not on file  . Sexually Abused: Not on file    Past Medical History, Surgical history, Social history, and Family history were reviewed and updated as appropriate.   Please see review of systems for further details on the patient's review from today.   Objective:   Physical Exam:  There were no vitals taken for this visit.  Physical Exam Constitutional:      General: She is not in acute distress. Musculoskeletal:        General: No deformity.  Neurological:     Mental Status: She is alert and oriented to person, place, and time.     Coordination: Coordination normal.  Psychiatric:        Attention and Perception: Attention and perception normal. She does not perceive auditory or visual hallucinations.        Mood and Affect: Mood is anxious and depressed. Affect is not labile, blunt, angry or inappropriate.        Speech: Speech normal.        Behavior: Behavior normal.        Thought Content: Thought content normal. Thought content is not paranoid or delusional. Thought content does not include homicidal or suicidal ideation. Thought content does not include homicidal or suicidal plan.        Cognition and Memory: Cognition and memory normal.        Judgment: Judgment normal.     Comments: Insight intact     Lab Review:     Component Value Date/Time   NA 139 07/10/2019 0953   NA 142  12/03/2011 1059   K 5.0 07/10/2019 0953   K 3.4 (L) 12/03/2011 1059   CL 101 07/10/2019 0953   CL 107 12/03/2011 1059   CO2 24 07/10/2019 0953   CO2 27 12/03/2011 1059   GLUCOSE 82 07/10/2019 0953   GLUCOSE 105 (H) 02/05/2018 1045   GLUCOSE 66 12/03/2011 1059   BUN 9 07/10/2019 0953   BUN 4 (L) 12/03/2011 1059   CREATININE 1.01 (H) 07/10/2019 0953   CREATININE 0.82 12/03/2011 1059   CALCIUM 9.5 07/10/2019 0953   CALCIUM 8.5 12/03/2011 1059   PROT 6.5 07/10/2019 0953   ALBUMIN 4.2 07/10/2019 0953   AST 17 07/10/2019 0953   ALT 12 07/10/2019 0953   ALKPHOS 80 07/10/2019 0953   BILITOT <0.2 07/10/2019 0953   GFRNONAA 67 07/10/2019 0953   GFRNONAA >60 12/03/2011 1059   GFRAA 78 07/10/2019 0953   GFRAA >60 12/03/2011 1059       Component Value Date/Time   WBC 4.9 07/10/2019 0953   WBC 4.7 02/05/2018 1045   RBC 4.77 07/10/2019 0953   RBC 4.65 02/05/2018 1045   HGB 13.5 07/10/2019 0953   HCT 41.0 07/10/2019 0953   PLT 340 07/10/2019 0953   MCV 86 07/10/2019 0953   MCH 28.3 07/10/2019 0953   MCH 28.8 02/05/2018 1045   MCHC 32.9 07/10/2019 0953   MCHC 33.9 02/05/2018 1045   RDW 14.0 07/10/2019 0953   LYMPHSABS 1.4 02/05/2018 1045   LYMPHSABS 1.8 12/27/2017 0740   MONOABS 0.4 02/05/2018 1045   EOSABS 0.1 02/05/2018 1045   EOSABS 0.1 12/27/2017 0740   BASOSABS 0.0 02/05/2018 1045   BASOSABS 0.0 12/27/2017 0740    No results found for: POCLITH, LITHIUM   No results found for: PHENYTOIN, PHENOBARB, VALPROATE, CBMZ   .res Assessment: Plan:   Discussed that recent discontinuation signs and symptoms may be exacerbated by  gastric ulcer and encouraged patient to follow-up with medical providers.  Discussed discontinuation signs symptoms and strategies to potentially minimize discontinuation. Patient reports that she prefers to continue medications without any changes. Continue Latuda 80 mg daily for mood stable sedation. Continue lamotrigine 150 mg twice daily for mood  stabilization. Continue alprazolam twice daily for anxiety. Continue Pristiq 150 mg daily for mood and anxiety. Continue Trileptal 150 mg morning and 300 mg at bedtime. Continue propanolol as needed for anxiety. Recommend continuing psychotherapy with Ashley Cloud, LCSW. Patient advised to contact office with any questions, adverse effects, or acute worsening in signs and symptoms.  Ashley Stephens was seen today for medication problem.  Diagnoses and all orders for this visit:  Mood disorder ([place]) -     lamoTRIgine (LAMICTAL) 150 MG tablet; Take 1 tablet (150 mg total) by mouth 2 (two) times daily.     Please see After Visit Summary for patient specific instructions.  Future Appointments  Date Time Provider [place]  06/03/2020 10:00 AM [name], LCSW CP-CP None  07/01/2020  8:00 AM [name], LCSW CP-CP None  07/07/2020  9:00 AM Ronnell Freshwater, NP [facility] None  07/08/2020  9:30 AM [name], PMHNP CP-CP None  07/15/2020  8:00 AM [name], LCSW CP-CP None  08/19/2020  8:00 AM [name], LCSW CP-CP None    No orders of the defined types were placed in this encounter.   -------------------------------

## 2020-05-13 ENCOUNTER — Ambulatory Visit (INDEPENDENT_AMBULATORY_CARE_PROVIDER_SITE_OTHER): Payer: 59 | Admitting: Psychiatry

## 2020-05-13 DIAGNOSIS — R69 Illness, unspecified: Secondary | ICD-10-CM | POA: Diagnosis not present

## 2020-05-13 DIAGNOSIS — F39 Unspecified mood [affective] disorder: Secondary | ICD-10-CM | POA: Diagnosis not present

## 2020-05-13 NOTE — Progress Notes (Signed)
      [facility] Counselor/Therapist Progress Note  Patient ID: Ashley Stephens, MRN: [id number],    Date: 05/13/2020  Time Spent: 48 minutes   8:12am to 9:00am  Treatment Type: Individual Therapy  Reported Symptoms: anxiety "a lot", some depression  Mental Status Exam:  Appearance:   Casual     Behavior:  Appropriate, Sharing and Motivated  Motor:  Normal  Speech/Language:   Clear and Coherent  Affect:  anxious  Mood:  anxious and some depression  Thought process:  goal directed  Thought content:    WNL  Sensory/Perceptual disturbances:    WNL  Orientation:  oriented to person, place, time/date, situation, day of week, month of year and year  Attention:  Good  Concentration:  Good and Fair  Memory:  WNL  Fund of knowledge:   Good  Insight:    Good and Fair  Judgment:   Good and Fair  Impulse Control:  Good and Fair   Risk Assessment: Danger to Self:  No Self-injurious Behavior: No Danger to Others: No Duty to Warn:no Physical Aggression / Violence:No  Access to Firearms a concern: No  Gang Involvement:No   Subjective: Patient today reports anxiety "a lot" and some depression "but mostly anxiety".  "I've done something good in letting go of some things especially within the family. Family may not appreciate my setting limits.  Interventions: Solution-Oriented/Positive Psychology and Ego-Supportive  Diagnosis:   ICD-10-CM   1. Mood disorder ([place])  F39     Plan: Patient not signing tx plan on computer screen due to [place].  Treatment Goals: Patient not signing tx plan on computer screen due to [place].  Long term goal: Develop the ability to recognize, accept, and cope with feelings of depression and anxiety.  Short term goal: Learn and implement personal skills for managing stress, solving daily problems, and resolving conflicts effectively.  Strategy: Teach patient calming strategies, problem solving skills, and conflict resolution skills to  better manage daily stressors.  Progressing- Patient in today reporting anxiety heightened and some depression.  Working on setting some healthier boundaries within her family especially her adult children that she admits "I have enabled some of their behavior."  This is a carryover from last session as she processed feelings at that time about how "I havent been taking care of myself and have always been putting others first, and that's not working well."  Ongoing problems with "my ulcer and I'm following up with doctor on that." Continued work with her short-term goal and strategy as listed above in tx goal plan and reviewed with her today, progress noted on some of her current coping skills as well supporting her in setting boundaries and maintain them even when other family members have difficulty accepting the boundaries. Is backing off from her "perfectionistic" tendencies and gave some examples in session today. Unsure about her liposuction surgery she had spoken about last session---"it's a bit up in the air due to my ulcer and getting it checked first."  Supported her improved self-care including setting boundaries without guilt, getting outside some each day, take meds as prescribed, taking occasional breaks throughout the day, working to make self-talk more encouraging and positive, and looking for more positives than negatives.  Goal review and progress/challenges noted with patient.  Next appt within 2 weeks.   [name], LCSW

## 2020-05-23 ENCOUNTER — Other Ambulatory Visit: Payer: Self-pay | Admitting: Psychiatry

## 2020-05-23 DIAGNOSIS — F5101 Primary insomnia: Secondary | ICD-10-CM

## 2020-05-24 ENCOUNTER — Ambulatory Visit (INDEPENDENT_AMBULATORY_CARE_PROVIDER_SITE_OTHER): Payer: 59 | Admitting: Internal Medicine

## 2020-05-24 ENCOUNTER — Encounter: Payer: Self-pay | Admitting: Internal Medicine

## 2020-05-24 ENCOUNTER — Other Ambulatory Visit: Payer: Self-pay

## 2020-05-24 DIAGNOSIS — E1165 Type 2 diabetes mellitus with hyperglycemia: Secondary | ICD-10-CM | POA: Diagnosis not present

## 2020-05-24 DIAGNOSIS — Z9884 Bariatric surgery status: Secondary | ICD-10-CM

## 2020-05-24 DIAGNOSIS — L0231 Cutaneous abscess of buttock: Secondary | ICD-10-CM | POA: Diagnosis not present

## 2020-05-24 DIAGNOSIS — L03317 Cellulitis of buttock: Secondary | ICD-10-CM

## 2020-05-24 MED ORDER — CEPHALEXIN 500 MG PO CAPS: 500.0000 mg | ORAL_CAPSULE | Freq: Three times a day (TID) | ORAL | 0 refills | Status: DC

## 2020-05-24 NOTE — Treatment Plan (Signed)
PT given sample of trulicity 6.57 and was shown how to use the pen medication in office.  Pt gave herself the first dose injection as I watched.  Pt seemed to understand instructions and also understand how often she is to inject medication.  dbs

## 2020-05-24 NOTE — Progress Notes (Signed)
[facility] [place], [place] [address]  Internal MEDICINE  Office Visit Note  Patient Name: Ashley Stephens  [id number]  [id number]  Date of Service: 05/26/2020  Chief Complaint  Patient presents with  . Acute Visit    cellutlitis on right leg, hurts now, but last week it was hot to touch and swollen  . Depression  . Hypertension  . Quality Metric Gaps    HepC, HIV, TDAP, pap    HPI Pt is here for acute visit and some other concerns  1. Has right leg pain and swelling, was hot to touch until yesterday but today feels better, denies any rash or trauma.  2. Pt is upset about her diagnosis of DM, she was started on metformin by her Psych. Overall she is tolerating this medicine ok but will like to know other option. She is very much interested in losing weight. 3. She has struggled with depression for a long time however she seems to have a better control  4.H/o gastric bypass, has been on B12 replacement // low ferritin level,    Current Medication: Outpatient Encounter Medications as of 05/24/2020  Medication Sig  . ALPRAZolam (XANAX) 0.25 MG tablet Take 1 tablet (0.25 mg total) by mouth 2 (two) times daily as needed. for anxiety  . cholecalciferol (VITAMIN D3) 25 MCG (1000 UNIT) tablet Take 1,000 Units by mouth daily.  . Cyanocobalamin (VITAMIN B 12 PO) Take by mouth.  . desvenlafaxine (PRISTIQ) 100 MG 24 hr tablet Take 1 tablet (100 mg total) by mouth every morning.  . desvenlafaxine (PRISTIQ) 50 MG 24 hr tablet Take 1 tablet (50 mg total) by mouth every evening.  . lamoTRIgine (LAMICTAL) 150 MG tablet Take 1 tablet (150 mg total) by mouth 2 (two) times daily.  Marland Kitchen levothyroxine (SYNTHROID) 25 MCG tablet Take 1 tablet (25 mcg total) by mouth daily before breakfast.  . metFORMIN (GLUCOPHAGE) 500 MG tablet Take 1 tablet (500 mg total) by mouth 2 (two) times daily with a meal.  . Oxcarbazepine (TRILEPTAL) 300 MG tablet Take 0.5 tablets (150 mg total)  by mouth every morning AND 1 tablet (300 mg total) at bedtime.  . propranolol (INDERAL) 20 MG tablet Take 1 tablet (20 mg total) by mouth 3 (three) times daily as needed.  . [DISCONTINUED] BIOTIN PO Take by mouth.  . [DISCONTINUED] hydrOXYzine (VISTARIL) 50 MG capsule Take 1 capsule by mouth three times daily as needed  . [DISCONTINUED] omeprazole (PRILOSEC) 40 MG capsule Take 1 capsule (40 mg total) by mouth 2 (two) times daily before a meal. (Patient taking differently: Take 40 mg by mouth 2 (two) times daily as needed. )  . cephALEXin (KEFLEX) 500 MG capsule Take 1 capsule (500 mg total) by mouth 3 (three) times daily.  Marland Kitchen lurasidone (LATUDA) 80 MG TABS tablet Take 1 tablet (80 mg total) by mouth daily with breakfast.  . [DISCONTINUED] cyclobenzaprine (FLEXERIL) 5 MG tablet  (Patient not taking: Reported on 04/11/2020)  . [DISCONTINUED] sucralfate (CARAFATE) 1 GM/10ML suspension Take 10 mLs (1 g total) by mouth 4 (four) times daily -  with meals and at bedtime. (Patient not taking: Reported on 04/11/2020)   No facility-administered encounter medications on file as of 05/24/2020.    Surgical History: Past Surgical History:  Procedure Laterality Date  . ABDOMINAL HYSTERECTOMY  2013  . BARIATRIC SURGERY    . BREAST BIOPSY  1995  . GASTRIC BYPASS  2012  . [place]  Medical History: Past Medical History:  Diagnosis Date  . Anxiety   . Depression   . Gastric ulcer   . GERD (gastroesophageal reflux disease)   . Hypertension   . Osteoarthritis   . Thyroid disorder     Family History: Family History  Problem Relation Age of Onset  . Breast cancer Maternal Grandmother     Social History   Socioeconomic History  . Marital status: Single    Spouse name: Not on file  . Number of children: Not on file  . Years of education: Not on file  . Highest education level: Not on file  Occupational History  . Not on file  Tobacco Use  . Smoking status: Never Smoker  .  Smokeless tobacco: Never Used  Vaping Use  . Vaping Use: Never used  Substance and Sexual Activity  . Alcohol use: No    Alcohol/week: 0.0 standard drinks  . Drug use: No  . Sexual activity: Yes    Birth control/protection: Surgical  Other Topics Concern  . Not on file  Social History Narrative  . Not on file   Social Determinants of Health   Financial Resource Strain:   . Difficulty of Paying Living Expenses: Not on file  Food Insecurity:   . Worried About Charity fundraiser in the Last Year: Not on file  . Ran Out of Food in the Last Year: Not on file  Transportation Needs:   . Lack of Transportation (Medical): Not on file  . Lack of Transportation (Non-Medical): Not on file  Physical Activity:   . Days of Exercise per Week: Not on file  . Minutes of Exercise per Session: Not on file  Stress:   . Feeling of Stress : Not on file  Social Connections:   . Frequency of Communication with Friends and Family: Not on file  . Frequency of Social Gatherings with Friends and Family: Not on file  . Attends Religious Services: Not on file  . Active Member of Clubs or Organizations: Not on file  . Attends Archivist Meetings: Not on file  . Marital Status: Not on file  Intimate Partner Violence:   . Fear of Current or Ex-Partner: Not on file  . Emotionally Abused: Not on file  . Physically Abused: Not on file  . Sexually Abused: Not on file      Review of Systems  Constitutional: Negative for chills, diaphoresis and fatigue.  HENT: Negative for ear pain, postnasal drip and sinus pressure.   Eyes: Negative for photophobia, discharge, redness, itching and visual disturbance.  Respiratory: Negative for cough, shortness of breath and wheezing.   Cardiovascular: Negative for chest pain, palpitations and leg swelling.  Gastrointestinal: Negative for abdominal pain, constipation, diarrhea, nausea and vomiting.  Genitourinary: Negative for dysuria and flank pain.   Musculoskeletal: Negative for arthralgias, back pain, gait problem and neck pain.  Skin: Negative for color change.       Pain and swelling right thigh and buttock area   Allergic/Immunologic: Negative for environmental allergies and food allergies.  Neurological: Negative for dizziness and headaches.  Hematological: Does not bruise/bleed easily.  Psychiatric/Behavioral: Negative for agitation, behavioral problems (depression) and hallucinations.    Vital Signs: BP 126/82   Pulse 77   Temp (!) 97.3 F (36.3 C)   Resp 16   Ht 5\' 7"  (1.702 m)   Wt 272 lb 9.6 oz (123.7 kg)   SpO2 96%   BMI 42.70 kg/m  Physical Exam Constitutional:      Appearance: Normal appearance. She is obese.  Cardiovascular:     Rate and Rhythm: Normal rate and regular rhythm.  Pulmonary:     Effort: Pulmonary effort is normal.     Breath sounds: Normal breath sounds. No stridor. No rhonchi.  Skin:    General: Skin is warm.     Findings: Erythema present.  Neurological:     General: No focal deficit present.     Mental Status: She is alert and oriented to person, place, and time.  Psychiatric:        Mood and Affect: Mood normal.     Assessment/Plan: 1. Cellulitis and abscess of buttock Swelling seems to be going down, will monitor and start keflex - cephALEXin (KEFLEX) 500 MG capsule; Take 1 capsule (500 mg total) by mouth 3 (three) times daily.  Dispense: 15 capsule; Refill: 0  2. Type 2 diabetes mellitus with hyperglycemia, without long-term current use of insulin (HCC) Stop metformin and start Trulicity 7.07 mg --8.67 samples given qweek. ?? Glucometer   3. History of Roux-en-Y gastric bypass Pt hs multiple nutritional deficiencies, will recheck B12 and Ferritin    General Counseling: Ashley Stephens verbalizes understanding of the findings of todays visit and agrees with plan of treatment. I have discussed any further diagnostic evaluation that may be needed or ordered today. We also reviewed  her medications today. she has been encouraged to call the office with any questions or concerns that should arise related to todays visit.   Meds ordered this encounter  Medications  . cephALEXin (KEFLEX) 500 MG capsule    Sig: Take 1 capsule (500 mg total) by mouth 3 (three) times daily.    Dispense:  15 capsule    Refill:  0    Total time spent: 35 Minutes Time spent includes review of chart, medications, test results, and follow up plan with the patient.      Dr Lavera Guise Internal medicine

## 2020-05-24 NOTE — Telephone Encounter (Signed)
review 

## 2020-05-27 ENCOUNTER — Ambulatory Visit: Payer: 59 | Admitting: Psychiatry

## 2020-05-27 HISTORY — PX: OTHER SURGICAL HISTORY: SHX169

## 2020-06-02 ENCOUNTER — Telehealth: Payer: Self-pay

## 2020-06-02 NOTE — Telephone Encounter (Signed)
As per dr Humphrey Rolls advised pt that she can pickup Trulicity 1.5 AW/9.0PW sample and also advised pt that dr Humphrey Rolls can send pres when see her next week

## 2020-06-03 ENCOUNTER — Ambulatory Visit (INDEPENDENT_AMBULATORY_CARE_PROVIDER_SITE_OTHER): Payer: 59 | Admitting: Psychiatry

## 2020-06-03 ENCOUNTER — Other Ambulatory Visit: Payer: Self-pay

## 2020-06-03 DIAGNOSIS — F39 Unspecified mood [affective] disorder: Secondary | ICD-10-CM

## 2020-06-03 DIAGNOSIS — R69 Illness, unspecified: Secondary | ICD-10-CM | POA: Diagnosis not present

## 2020-06-03 NOTE — Progress Notes (Signed)
      [facility] Counselor/Therapist Progress Note  Patient ID: Ashley Stephens, MRN: [id number],    Date: 06/03/2020  Time Spent: 49 mintues  10:00am to 11:00am   Treatment Type: Individual Therapy  Reported Symptoms: anxiety, depression, anger, frustrated, difficult family issues  Mental Status Exam:  Appearance:   Casual     Behavior:  Appropriate, Sharing and Motivated  Motor:  Normal  Speech/Language:   Clear and Coherent  Affect:  anxiety, depression  Mood:  anxious, depressed and think too much about the past and makes me sad  Thought process:  goal directed  Thought content:    WNL  Sensory/Perceptual disturbances:    WNL  Orientation:  oriented to person, place, time/date, situation, day of week, month of year and year  Attention:  Fair  Concentration:  Fair  Memory:  [place] of knowledge:   Good  Insight:    Good and Fair  Judgment:   Good and Fair  Impulse Control:  Fair   Risk Assessment: Danger to Self:  No Self-injurious Behavior: No Danger to Others: No Duty to Warn:no Physical Aggression / Violence:No  Access to Firearms a concern: No  Gang Involvement:No   Subjective: Patient today reports anxiety and depression, "but anxiety is worse".    Interventions: Cognitive Behavioral Therapy and Solution-Oriented/Positive Psychology  Diagnosis:   ICD-10-CM   1. Mood disorder ([place])  F39     Plan: Patient not signing tx plan on computer screen due to [place].  Treatment Goals: Patient not signing tx plan on computer screen due to [place].  Long term goal: Develop the ability to recognize, accept, and cope with feelings of depression and anxiety.  Short term goal: Learn and implement personal skills for managing stress, solving daily problems, and resolving conflicts effectively.  Strategy: Teach patient calming strategies, problem solving skills, and conflict resolution skills to better manage daily stressors.  Progressing- Patient  in today reporting anxiety is her stronger symptom.  Depressed, some sleep issues but feels her med Pristiq helps her sleep.  Frustrations with adult son and daughter that "use me". Having discomfort from a recent "liposuction and tummy tuck". Processing today a lot of her anxious/depressive/angry thoughts and feelings, a lot of which are about family issues. Gave several examples today of multiple recent situations within family and vented her anger and frustration. Discussed "aloneness" and her need and desire to make friends that will be healthy for her.  Tearful as she discussed some dysfunction within her family and how she wants/needs some healthier connections to people. Spoke of ways that she might meet people and plans to follow through on that once she heals from surgery and is feeling stronger physically.  Denies any SI. Encouraged positive self-care for patient including not setting extremely high unrealistic herself, being less perfectionistic, using healthy boundaries as needed with others, exploring ways to meet other people, letting go of guilt, getting out side some each day, working to make her self-talk more positive, remaining on her meds as prescribed, taking breaks for herself throughout the day, staying in the present versus the past or future, and be looking for more positives versus negatives each day.  Goal review and progress/challenges noted with patient.  Next appt within 2-3 weeks.   [name], LCSW

## 2020-06-08 ENCOUNTER — Other Ambulatory Visit: Payer: Self-pay | Admitting: *Deleted

## 2020-06-08 ENCOUNTER — Ambulatory Visit: Payer: 59 | Admitting: Internal Medicine

## 2020-06-08 NOTE — Patient Outreach (Signed)
[name]) Care Management  06/08/2020  Ashley Stephens 1976/03/05 [id number]   Referral received from care management assistant to contact member for involvement with [employer] Hypertension initiative.  Identity verified.  This care manager introduced self and stated purpose of call.  [facility] care management services explained.  She agrees to program however does not have much time to talk today.  Report she works daily from 11am-8pm, would prefer to be contacted prior to 11am.  She denies any urgent concerns at this time.  This care manager will place referral to health coach for ongoing disease management as member also has history of diabetes.  Will send EMMI education regarding HTN management and blood pressure monitor.  Valente David, [place], MSN [place] [phone]

## 2020-06-09 ENCOUNTER — Other Ambulatory Visit: Payer: Self-pay | Admitting: *Deleted

## 2020-06-09 ENCOUNTER — Ambulatory Visit: Payer: 59 | Admitting: Internal Medicine

## 2020-06-16 ENCOUNTER — Other Ambulatory Visit: Payer: Self-pay

## 2020-06-16 ENCOUNTER — Other Ambulatory Visit: Payer: Self-pay | Admitting: Internal Medicine

## 2020-06-16 ENCOUNTER — Encounter: Payer: Self-pay | Admitting: Internal Medicine

## 2020-06-16 ENCOUNTER — Encounter (INDEPENDENT_AMBULATORY_CARE_PROVIDER_SITE_OTHER): Payer: Self-pay

## 2020-06-16 ENCOUNTER — Ambulatory Visit: Payer: 59 | Admitting: Internal Medicine

## 2020-06-16 DIAGNOSIS — Z23 Encounter for immunization: Secondary | ICD-10-CM | POA: Diagnosis not present

## 2020-06-16 DIAGNOSIS — E1165 Type 2 diabetes mellitus with hyperglycemia: Secondary | ICD-10-CM

## 2020-06-16 DIAGNOSIS — E538 Deficiency of other specified B group vitamins: Secondary | ICD-10-CM

## 2020-06-16 DIAGNOSIS — E039 Hypothyroidism, unspecified: Secondary | ICD-10-CM | POA: Diagnosis not present

## 2020-06-16 MED ORDER — CYANOCOBALAMIN 1000 MCG/ML IJ SOLN
1000.0000 ug | Freq: Once | INTRAMUSCULAR | Status: AC
  Administered 2020-06-16: 1000 ug via INTRAMUSCULAR

## 2020-06-16 MED ORDER — TRULICITY 1.5 MG/0.5ML ~~LOC~~ SOAJ: 1.5000 mg | SUBCUTANEOUS | 12 refills | Status: DC

## 2020-06-16 NOTE — Progress Notes (Signed)
[facility] [place], [place] [address]  Internal MEDICINE  Office Visit Note  Patient Name: Ashley Stephens  [id number]  [id number]  Date of Service: 06/16/2020  Chief Complaint  Patient presents with  . Anxiety    2 weeks follow up   . Depression  . Gastroesophageal Reflux  . Quality Metric Gaps    Pap smear   . controlled substance policy    reviewed     HPI  Pt is here for f/u. Diabetic numbers are improving, she is tolerating Trulicity well. She is watching her diet as well. Pt has h/o gastric bypass, has low b12 levels, stopped supplement. Pt is also on Inderal ( need to be re-evaluated for frequency). She has been working at home since covid pandemi. She did start taking increased dose of synthroid @ tabs of 25 mcg   Pt just had a sosmetic surgery for back and abdominoplasty   Current Medication: Outpatient Encounter Medications as of 06/16/2020  Medication Sig  . ALPRAZolam (XANAX) 0.25 MG tablet Take 1 tablet (0.25 mg total) by mouth 2 (two) times daily as needed. for anxiety  . cephALEXin (KEFLEX) 500 MG capsule Take 1 capsule (500 mg total) by mouth 3 (three) times daily.  . cholecalciferol (VITAMIN D3) 25 MCG (1000 UNIT) tablet Take 1,000 Units by mouth daily.  . Cyanocobalamin (VITAMIN B 12 PO) Take by mouth.  . desvenlafaxine (PRISTIQ) 100 MG 24 hr tablet Take 1 tablet (100 mg total) by mouth every morning.  . desvenlafaxine (PRISTIQ) 50 MG 24 hr tablet Take 1 tablet (50 mg total) by mouth every evening.  . hydrOXYzine (VISTARIL) 50 MG capsule Take 1 capsule by mouth three times daily as needed  . lamoTRIgine (LAMICTAL) 150 MG tablet Take 1 tablet (150 mg total) by mouth 2 (two) times daily.  Marland Kitchen levothyroxine (SYNTHROID) 25 MCG tablet Take 1 tablet (25 mcg total) by mouth daily before breakfast.  . metFORMIN (GLUCOPHAGE) 500 MG tablet Take 1 tablet (500 mg total) by mouth 2 (two) times daily with a meal.  . Oxcarbazepine  (TRILEPTAL) 300 MG tablet Take 0.5 tablets (150 mg total) by mouth every morning AND 1 tablet (300 mg total) at bedtime.  . propranolol (INDERAL) 20 MG tablet Take 1 tablet (20 mg total) by mouth 3 (three) times daily as needed.  . lurasidone (LATUDA) 80 MG TABS tablet Take 1 tablet (80 mg total) by mouth daily with breakfast.  . [EXPIRED] cyanocobalamin ((VITAMIN B-12)) injection 1,000 mcg    No facility-administered encounter medications on file as of 06/16/2020.    Surgical History: Past Surgical History:  Procedure Laterality Date  . ABDOMINAL HYSTERECTOMY  2013  . BARIATRIC SURGERY    . BREAST BIOPSY  1995  . GASTRIC BYPASS  2012  . TUBAL LIGATION  1996  . tummy tuck  05/27/2020    Medical History: Past Medical History:  Diagnosis Date  . Anxiety   . Depression   . Gastric ulcer   . GERD (gastroesophageal reflux disease)   . Hypertension   . Osteoarthritis   . Thyroid disorder     Family History: Family History  Problem Relation Age of Onset  . Breast cancer Maternal Grandmother     Social History   Socioeconomic History  . Marital status: Single    Spouse name: Not on file  . Number of children: Not on file  . Years of education: Not on file  . Highest education level: Not on  file  Occupational History  . Not on file  Tobacco Use  . Smoking status: Never Smoker  . Smokeless tobacco: Never Used  Vaping Use  . Vaping Use: Never used  Substance and Sexual Activity  . Alcohol use: No    Alcohol/week: 0.0 standard drinks  . Drug use: No  . Sexual activity: Yes    Birth control/protection: Surgical  Other Topics Concern  . Not on file  Social History Narrative  . Not on file   Social Determinants of Health   Financial Resource Strain:   . Difficulty of Paying Living Expenses: Not on file  Food Insecurity:   . Worried About Charity fundraiser in the Last Year: Not on file  . Ran Out of Food in the Last Year: Not on file  Transportation Needs:   .  Lack of Transportation (Medical): Not on file  . Lack of Transportation (Non-Medical): Not on file  Physical Activity:   . Days of Exercise per Week: Not on file  . Minutes of Exercise per Session: Not on file  Stress:   . Feeling of Stress : Not on file  Social Connections:   . Frequency of Communication with Friends and Family: Not on file  . Frequency of Social Gatherings with Friends and Family: Not on file  . Attends Religious Services: Not on file  . Active Member of Clubs or Organizations: Not on file  . Attends Archivist Meetings: Not on file  . Marital Status: Not on file  Intimate Partner Violence:   . Fear of Current or Ex-Partner: Not on file  . Emotionally Abused: Not on file  . Physically Abused: Not on file  . Sexually Abused: Not on file      Review of Systems  Constitutional: Negative for chills, diaphoresis and fatigue.  HENT: Negative for ear pain, postnasal drip and sinus pressure.   Eyes: Negative for photophobia, discharge, redness, itching and visual disturbance.  Respiratory: Negative for cough, shortness of breath and wheezing.   Cardiovascular: Negative for chest pain, palpitations and leg swelling.  Gastrointestinal: Negative for abdominal pain, constipation, diarrhea, nausea and vomiting.  Genitourinary: Negative for dysuria and flank pain.  Musculoskeletal: Negative for arthralgias, back pain, gait problem and neck pain.  Skin: Negative for color change.  Allergic/Immunologic: Negative for environmental allergies and food allergies.  Neurological: Negative for dizziness and headaches.  Hematological: Does not bruise/bleed easily.  Psychiatric/Behavioral: Negative for agitation, behavioral problems (depression) and hallucinations.    Vital Signs: BP 130/80   Pulse 89   Resp 16   Ht 5\' 7"  (1.702 m)   Wt 273 lb (123.8 kg)   SpO2 98%   BMI 42.76 kg/m    Physical Exam Constitutional:      Appearance: Normal appearance.  HENT:      Head: Normocephalic and atraumatic.  Eyes:     Extraocular Movements: Extraocular movements intact.     Pupils: Pupils are equal, round, and reactive to light.  Cardiovascular:     Rate and Rhythm: Normal rate and regular rhythm.     Heart sounds: Normal heart sounds.  Pulmonary:     Effort: Pulmonary effort is normal.  Skin:    General: Skin is warm and dry.  Neurological:     Mental Status: She is alert.     Assessment/Plan: 1. Type 2 diabetes mellitus with hyperglycemia, without long-term current use of insulin (HCC) Continue Trulicity 1.50mg  q week.  2. Morbid obesity ([place]) Pt  needs to monitor her calories, get 10000 steps at least 5 days of the week  - TSH + free T4  3. Hypothyroidism, unspecified type Continue Synthroid  - TSH + free T4  4. B12 deficiency - cyanocobalamin ((VITAMIN B-12)) injection 1,000 mcg  5. Flu vaccine need - Flu Vaccine MDCK QUAD PF  General Counseling: Omya verbalizes understanding of the findings of todays visit and agrees with plan of treatment. I have discussed any further diagnostic evaluation that may be needed or ordered today. We also reviewed her medications today. she has been encouraged to call the office with any questions or concerns that should arise related to todays visit.    Orders Placed This Encounter  Procedures  . Flu Vaccine MDCK QUAD PF  . TSH + free T4    Meds ordered this encounter  Medications  . cyanocobalamin ((VITAMIN B-12)) injection 1,000 mcg    Total time spent: 30 Minutes Time spent includes review of chart, medications, test results, and follow up plan with the patient.      Dr Lavera Guise Internal medicine

## 2020-06-17 ENCOUNTER — Telehealth: Payer: Self-pay

## 2020-06-17 ENCOUNTER — Other Ambulatory Visit: Payer: Self-pay

## 2020-06-17 DIAGNOSIS — M47816 Spondylosis without myelopathy or radiculopathy, lumbar region: Secondary | ICD-10-CM | POA: Diagnosis not present

## 2020-06-17 MED ORDER — TRULICITY 1.5 MG/0.5ML ~~LOC~~ SOAJ: 1.5000 mg | SUBCUTANEOUS | 12 refills | Status: DC

## 2020-06-17 NOTE — Telephone Encounter (Signed)
lmom for trulicity

## 2020-06-17 NOTE — Telephone Encounter (Signed)
Spoke with pt, informed her she needs to have lab work done before her next appt in Nov. And let her know she can go to any [facility].

## 2020-07-01 ENCOUNTER — Other Ambulatory Visit: Payer: Self-pay

## 2020-07-01 ENCOUNTER — Ambulatory Visit (INDEPENDENT_AMBULATORY_CARE_PROVIDER_SITE_OTHER): Payer: 59 | Admitting: Psychiatry

## 2020-07-01 DIAGNOSIS — F39 Unspecified mood [affective] disorder: Secondary | ICD-10-CM | POA: Diagnosis not present

## 2020-07-01 DIAGNOSIS — R69 Illness, unspecified: Secondary | ICD-10-CM | POA: Diagnosis not present
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
## 2020-07-07 ENCOUNTER — Encounter: Payer: 59 | Admitting: Internal Medicine

## 2020-07-08 ENCOUNTER — Other Ambulatory Visit: Payer: Self-pay | Admitting: *Deleted

## 2020-07-08 ENCOUNTER — Ambulatory Visit (INDEPENDENT_AMBULATORY_CARE_PROVIDER_SITE_OTHER): Payer: 59 | Admitting: Psychiatry

## 2020-07-08 ENCOUNTER — Other Ambulatory Visit: Payer: Self-pay

## 2020-07-08 ENCOUNTER — Encounter: Payer: Self-pay | Admitting: Psychiatry

## 2020-07-08 DIAGNOSIS — F39 Unspecified mood [affective] disorder: Secondary | ICD-10-CM | POA: Diagnosis not present

## 2020-07-08 DIAGNOSIS — R69 Illness, unspecified: Secondary | ICD-10-CM | POA: Diagnosis not present

## 2020-07-08 DIAGNOSIS — F411 Generalized anxiety disorder: Secondary | ICD-10-CM

## 2020-07-08 DIAGNOSIS — F5101 Primary insomnia: Secondary | ICD-10-CM | POA: Diagnosis not present

## 2020-07-08 MED ORDER — HYDROXYZINE PAMOATE 50 MG PO CAPS: ORAL_CAPSULE | ORAL | 0 refills | Status: DC

## 2020-07-08 MED ORDER — OXCARBAZEPINE 300 MG PO TABS: ORAL_TABLET | ORAL | 0 refills | Status: DC

## 2020-07-08 MED ORDER — LURASIDONE HCL 80 MG PO TABS: 80.0000 mg | ORAL_TABLET | Freq: Every day | ORAL | 3 refills | Status: DC

## 2020-07-08 MED ORDER — ALPRAZOLAM 0.25 MG PO TABS: 0.2500 mg | ORAL_TABLET | Freq: Two times a day (BID) | ORAL | 2 refills | Status: DC | PRN

## 2020-07-08 NOTE — Patient Outreach (Signed)
[facility]) Care Management  07/08/2020  Annalyce Lanpher [birth year] [id number]  Unsuccessful outreach attempt made to patient. RN Health Coach left HIPAA compliant voicemail message along with her contact information.  Plan: RN Health Coach will call patient within the month of December.  [name] RN, BSN [place] [phone] [email or web address]

## 2020-07-08 NOTE — Progress Notes (Signed)
Ashley Stephens [id number] [birth year] 44 y.o.  Subjective:   Patient ID:  Ashley Stephens is a 80 y.o. (DOB 01-18-1976) female.  Chief Complaint:  Chief Complaint  Patient presents with  . Depression  . Anxiety    HPI Ashley Stephens presents to the office today for follow-up of anxiety, depression, and sleep disturbance. She reports that depression "has been at an all-time high... hasn't been easy to pull it back in." She reports that someone she dated in the past died on 2020/06/17. She reports that this caught her off guard. She reports that she had a tummy tuck and liposuction in 2 stages without sedation. She reports that she had "excrutiating" pain. She reports that she has had intrusive memories and re-experiencing these events. She reports that she has been having nightmares. Reports hypervigilance since procedures. She reports that the police were called after she communicated that she had severe depression. She police and a therapist came out and spoke to her. She reports persistent depressed mood. She reports that she has also had irritability. "Anxiety has been through the roof."   Has been awakening every night around 3-4 am and is eventually able to get back to sleep. She reports decreased appetite since starting Trulicity. Motivation has been low over the last 2 weeks. Energy has been low. Concentration varies. Denies SI.   She reports that increase in Trileptal has been helpful for her anxiety.    She reports that she has headaches when it feels like her medication is wearing off.   Past medication trials: Wellbutrin Effexor Pristiq-effective Lamictal-effective for mood Latuda Xanax- Usually takes at night Propanolol- Effective Hydroxyzine Rexulti Gabapentin- prescribed for back pain. Dizziness, headaches.  Lithium Trileptal   AIMS     Office Visit from 04/11/2020 in [place] Visit from 11/27/2019 in [place] Visit from 08/14/2019 in [place] Visit from 05/29/2019 in [place] Total Score 0 0 0 0    GAD-7     Office Visit from 12/25/2019 in [place]  Total GAD-7 Score 16    PHQ2-9     Office Visit from 05/24/2020 in [facility], [facility] Office Visit from 04/05/2020 in [facility], [place] from 12/25/2019 in [place] Visit from 12/03/2019 in [facility], [facility] Office Visit from 06/26/2019 in [facility], [facility]  PHQ-2 Total Score 0 0 5 0 0  PHQ-9 Total Score -- -- 18 -- --       Review of Systems:  Review of Systems  Gastrointestinal: Positive for abdominal pain.  Endocrine:       She reports improved glycemic control.  Musculoskeletal: Negative for gait problem.  Neurological: Positive for headaches. Negative for tremors.  Psychiatric/Behavioral:       Please refer to HPI    Medications: I have reviewed the patient's current medications.  Current Outpatient Medications  Medication Sig Dispense Refill  . [START ON 07/10/2020] ALPRAZolam (XANAX) 0.25 MG tablet Take 1 tablet (0.25 mg total) by mouth 2 (two) times daily as needed. for anxiety 45 tablet 2  . cholecalciferol (VITAMIN D3) 25 MCG (1000 UNIT) tablet Take 1,000 Units by mouth daily.    . Cyanocobalamin (VITAMIN B 12 PO) Take by mouth.    . desvenlafaxine (PRISTIQ) 100 MG 24 hr tablet Take 1 tablet (100 mg total) by mouth every morning. 90 tablet 1  . desvenlafaxine (PRISTIQ) 50 MG 24  hr tablet Take 1 tablet (50 mg total) by mouth every evening. 90 tablet 1  . Dulaglutide (TRULICITY) 1.5 UX/3.2GM SOPN Inject 1.5 mg into the skin once a week. 3 mL 12  . hydrOXYzine (VISTARIL) 50 MG capsule Take 1 capsule by mouth three times daily as needed 90 capsule 0  . lamoTRIgine (LAMICTAL) 150 MG tablet Take 1 tablet (150 mg total) by mouth 2 (two) times daily. 180 tablet 1  .  levothyroxine (SYNTHROID) 25 MCG tablet Take 1 tablet (25 mcg total) by mouth daily before breakfast. 90 tablet 2  . Oxcarbazepine (TRILEPTAL) 300 MG tablet Take 0.5 tablets (150 mg total) by mouth every morning AND 1 tablet (300 mg total) at bedtime. 135 tablet 0  . propranolol (INDERAL) 20 MG tablet Take 1 tablet (20 mg total) by mouth 3 (three) times daily as needed. 270 tablet 1  . cephALEXin (KEFLEX) 500 MG capsule Take 1 capsule (500 mg total) by mouth 3 (three) times daily. (Patient not taking: Reported on 07/08/2020) 15 capsule 0  . lurasidone (LATUDA) 80 MG TABS tablet Take 1 tablet (80 mg total) by mouth daily with breakfast. 30 tablet 3   No current facility-administered medications for this visit.    Medication Side Effects: Other: Headaches  Allergies: No Known Allergies  Past Medical History:  Diagnosis Date  . Anxiety   . Depression   . Gastric ulcer   . GERD (gastroesophageal reflux disease)   . Hypertension   . Osteoarthritis   . Thyroid disorder     Family History  Problem Relation Age of Onset  . Breast cancer Maternal Grandmother     Social History   Socioeconomic History  . Marital status: Single    Spouse name: Not on file  . Number of children: Not on file  . Years of education: Not on file  . Highest education level: Not on file  Occupational History  . Not on file  Tobacco Use  . Smoking status: Never Smoker  . Smokeless tobacco: Never Used  Vaping Use  . Vaping Use: Never used  Substance and Sexual Activity  . Alcohol use: No    Alcohol/week: 0.0 standard drinks  . Drug use: No  . Sexual activity: Yes    Birth control/protection: Surgical  Other Topics Concern  . Not on file  Social History Narrative  . Not on file   Social Determinants of Health   Financial Resource Strain:   . Difficulty of Paying Living Expenses: Not on file  Food Insecurity:   . Worried About Charity fundraiser in the Last Year: Not on file  . Ran Out of Food  in the Last Year: Not on file  Transportation Needs:   . Lack of Transportation (Medical): Not on file  . Lack of Transportation (Non-Medical): Not on file  Physical Activity:   . Days of Exercise per Week: Not on file  . Minutes of Exercise per Session: Not on file  Stress:   . Feeling of Stress : Not on file  Social Connections:   . Frequency of Communication with Friends and Family: Not on file  . Frequency of Social Gatherings with Friends and Family: Not on file  . Attends Religious Services: Not on file  . Active Member of Clubs or Organizations: Not on file  . Attends Archivist Meetings: Not on file  . Marital Status: Not on file  Intimate Partner Violence:   . Fear of Current or Ex-Partner: Not  on file  . Emotionally Abused: Not on file  . Physically Abused: Not on file  . Sexually Abused: Not on file    Past Medical History, Surgical history, Social history, and Family history were reviewed and updated as appropriate.   Please see review of systems for further details on the patient's review from today.   Objective:   Physical Exam:  There were no vitals taken for this visit.  Physical Exam Constitutional:      General: She is not in acute distress. Musculoskeletal:        General: No deformity.  Neurological:     Mental Status: She is alert and oriented to person, place, and time.     Coordination: Coordination normal.  Psychiatric:        Attention and Perception: Attention and perception normal. She does not perceive auditory or visual hallucinations.        Mood and Affect: Mood is anxious and depressed. Affect is not labile, blunt, angry or inappropriate.        Speech: Speech normal.        Behavior: Behavior normal.        Thought Content: Thought content normal. Thought content is not paranoid or delusional. Thought content does not include homicidal or suicidal ideation. Thought content does not include homicidal or suicidal plan.         Cognition and Memory: Cognition and memory normal.        Judgment: Judgment normal.     Comments: Insight intact     Lab Review:     Component Value Date/Time   NA 139 07/10/2019 0953   NA 142 12/03/2011 1059   K 5.0 07/10/2019 0953   K 3.4 (L) 12/03/2011 1059   CL 101 07/10/2019 0953   CL 107 12/03/2011 1059   CO2 24 07/10/2019 0953   CO2 27 12/03/2011 1059   GLUCOSE 82 07/10/2019 0953   GLUCOSE 105 (H) 02/05/2018 1045   GLUCOSE 66 12/03/2011 1059   BUN 9 07/10/2019 0953   BUN 4 (L) 12/03/2011 1059   CREATININE 1.01 (H) 07/10/2019 0953   CREATININE 0.82 12/03/2011 1059   CALCIUM 9.5 07/10/2019 0953   CALCIUM 8.5 12/03/2011 1059   PROT 6.5 07/10/2019 0953   ALBUMIN 4.2 07/10/2019 0953   AST 17 07/10/2019 0953   ALT 12 07/10/2019 0953   ALKPHOS 80 07/10/2019 0953   BILITOT <0.2 07/10/2019 0953   GFRNONAA 67 07/10/2019 0953   GFRNONAA >60 12/03/2011 1059   GFRAA 78 07/10/2019 0953   GFRAA >60 12/03/2011 1059       Component Value Date/Time   WBC 4.9 07/10/2019 0953   WBC 4.7 02/05/2018 1045   RBC 4.77 07/10/2019 0953   RBC 4.65 02/05/2018 1045   HGB 13.5 07/10/2019 0953   HCT 41.0 07/10/2019 0953   PLT 340 07/10/2019 0953   MCV 86 07/10/2019 0953   MCH 28.3 07/10/2019 0953   MCH 28.8 02/05/2018 1045   MCHC 32.9 07/10/2019 0953   MCHC 33.9 02/05/2018 1045   RDW 14.0 07/10/2019 0953   LYMPHSABS 1.4 02/05/2018 1045   LYMPHSABS 1.8 12/27/2017 0740   MONOABS 0.4 02/05/2018 1045   EOSABS 0.1 02/05/2018 1045   EOSABS 0.1 12/27/2017 0740   BASOSABS 0.0 02/05/2018 1045   BASOSABS 0.0 12/27/2017 0740    No results found for: POCLITH, LITHIUM   No results found for: PHENYTOIN, PHENOBARB, VALPROATE, CBMZ   .res Assessment: Plan:   Pt seen for 30  minutes and time spent counseling pt that she is exhibiting some trauma-based anxiety s/s. Discussed that this provider will confer with her therapist to discuss possibly initiating some trauma based therapy to address  anxiety s/s that have worsened after recent traumatic event.  Pt reports that she would prefer not to make any changes in medications at this time since she feels that current medications have been helpful for her mood and anxiety s/s. She reports that she is also concerned about having to cope with any potential side effects or risk of worsening s/s with medication changes "since I am already coping with so much right now." Will continue current medications without changes.  Pt to follow-up in 4-6 weeks or sooner if clinically indicated.  Patient advised to contact office with any questions, adverse effects, or acute worsening in signs and symptoms.  Caddie was seen today for depression and anxiety.  Diagnoses and all orders for this visit:  Generalized anxiety disorder -     ALPRAZolam (XANAX) 0.25 MG tablet; Take 1 tablet (0.25 mg total) by mouth 2 (two) times daily as needed. for anxiety -     Oxcarbazepine (TRILEPTAL) 300 MG tablet; Take 0.5 tablets (150 mg total) by mouth every morning AND 1 tablet (300 mg total) at bedtime.  Primary insomnia -     hydrOXYzine (VISTARIL) 50 MG capsule; Take 1 capsule by mouth three times daily as needed -     Oxcarbazepine (TRILEPTAL) 300 MG tablet; Take 0.5 tablets (150 mg total) by mouth every morning AND 1 tablet (300 mg total) at bedtime.  Mood disorder (HCC) -     lurasidone (LATUDA) 80 MG TABS tablet; Take 1 tablet (80 mg total) by mouth daily with breakfast. -     Oxcarbazepine (TRILEPTAL) 300 MG tablet; Take 0.5 tablets (150 mg total) by mouth every morning AND 1 tablet (300 mg total) at bedtime.     Please see After Visit Summary for patient specific instructions.  Future Appointments  Date Time Provider [place]  07/15/2020  8:00 AM [name], LCSW [facility] None  08/12/2020  8:30 AM [name], PMHNP [facility] None  08/19/2020  8:00 AM [name], LCSW [facility] None  08/22/2020  9:00 AM Wine, Argie Ramming, RN [facility] None    No  orders of the defined types were placed in this encounter.   -------------------------------

## 2020-07-12 ENCOUNTER — Encounter (HOSPITAL_BASED_OUTPATIENT_CLINIC_OR_DEPARTMENT_OTHER): Payer: Self-pay | Admitting: *Deleted

## 2020-07-12 ENCOUNTER — Emergency Department (HOSPITAL_BASED_OUTPATIENT_CLINIC_OR_DEPARTMENT_OTHER)
Admission: EM | Admit: 2020-07-12 | Discharge: 2020-07-12 | Disposition: A | Discharge status: Home / Self Care | Payer: 59 | Attending: Emergency Medicine | Admitting: Emergency Medicine

## 2020-07-12 ENCOUNTER — Emergency Department (HOSPITAL_BASED_OUTPATIENT_CLINIC_OR_DEPARTMENT_OTHER): Payer: 59

## 2020-07-12 ENCOUNTER — Other Ambulatory Visit: Payer: Self-pay

## 2020-07-12 DIAGNOSIS — I10 Essential (primary) hypertension: Secondary | ICD-10-CM | POA: Insufficient documentation

## 2020-07-12 DIAGNOSIS — Z90710 Acquired absence of both cervix and uterus: Secondary | ICD-10-CM | POA: Diagnosis not present

## 2020-07-12 DIAGNOSIS — S301XXA Contusion of abdominal wall, initial encounter: Secondary | ICD-10-CM | POA: Diagnosis not present

## 2020-07-12 DIAGNOSIS — S3991XA Unspecified injury of abdomen, initial encounter: Secondary | ICD-10-CM | POA: Diagnosis present

## 2020-07-12 DIAGNOSIS — K219 Gastro-esophageal reflux disease without esophagitis: Secondary | ICD-10-CM | POA: Diagnosis not present

## 2020-07-12 DIAGNOSIS — X58XXXA Exposure to other specified factors, initial encounter: Secondary | ICD-10-CM | POA: Insufficient documentation

## 2020-07-12 DIAGNOSIS — E1165 Type 2 diabetes mellitus with hyperglycemia: Secondary | ICD-10-CM | POA: Insufficient documentation

## 2020-07-12 DIAGNOSIS — R188 Other ascites: Secondary | ICD-10-CM | POA: Diagnosis not present

## 2020-07-12 DIAGNOSIS — R1031 Right lower quadrant pain: Secondary | ICD-10-CM | POA: Diagnosis not present

## 2020-07-12 LAB — COMPREHENSIVE METABOLIC PANEL
ALT: 14 U/L (ref 0–44)
AST: 17 U/L (ref 15–41)
Albumin: 4.2 g/dL (ref 3.5–5.0)
Alkaline Phosphatase: 62 U/L (ref 38–126)
Anion gap: 10 (ref 5–15)
BUN: 7 mg/dL (ref 6–20)
CO2: 25 mmol/L (ref 22–32)
Calcium: 8.9 mg/dL (ref 8.9–10.3)
Chloride: 103 mmol/L (ref 98–111)
Creatinine, Ser: 0.97 mg/dL (ref 0.44–1.00)
GFR, Estimated: >60 mL/min (ref >60)
Glucose, Bld: 92 mg/dL (ref 70–99)
Potassium: 4.1 mmol/L (ref 3.5–5.1)
Sodium: 138 mmol/L (ref 135–145)
Total Bilirubin: 0.2 mg/dL — ABNORMAL LOW (ref 0.3–1.2)
Total Protein: 7.1 g/dL (ref 6.5–8.1)

## 2020-07-12 LAB — CBC WITH DIFFERENTIAL/PLATELET
Abs Immature Granulocytes: 0.02 10*3/uL (ref 0.00–0.07)
Basophils Absolute: 0 10*3/uL (ref 0.0–0.1)
Basophils Relative: 0 %
Eosinophils Absolute: 0.1 10*3/uL (ref 0.0–0.5)
Eosinophils Relative: 2 %
HCT: 42.1 % (ref 36.0–46.0)
Hemoglobin: 14 g/dL (ref 12.0–15.0)
Immature Granulocytes: 0 %
Lymphocytes Relative: 34 %
Lymphs Abs: 2.4 10*3/uL (ref 0.7–4.0)
MCH: 29.4 pg (ref 26.0–34.0)
MCHC: 33.3 g/dL (ref 30.0–36.0)
MCV: 88.4 fL (ref 80.0–100.0)
Monocytes Absolute: 0.6 10*3/uL (ref 0.1–1.0)
Monocytes Relative: 9 %
Neutro Abs: 3.9 10*3/uL (ref 1.7–7.7)
Neutrophils Relative %: 55 %
Platelets: 342 10*3/uL (ref 150–400)
RBC: 4.76 MIL/uL (ref 3.87–5.11)
RDW: 12.8 % (ref 11.5–15.5)
WBC: 7 10*3/uL (ref 4.0–10.5)
nRBC: 0 % (ref 0.0–0.2)

## 2020-07-12 LAB — URINALYSIS, ROUTINE W REFLEX MICROSCOPIC
Bilirubin Urine: NEGATIVE
Glucose, UA: NEGATIVE mg/dL
Hgb urine dipstick: NEGATIVE
Ketones, ur: NEGATIVE mg/dL
Leukocytes,Ua: NEGATIVE
Nitrite: NEGATIVE
Protein, ur: NEGATIVE mg/dL
Specific Gravity, Urine: 1.02 (ref 1.005–1.030)
pH: 6.5 (ref 5.0–8.0)

## 2020-07-12 LAB — LIPASE, BLOOD: Lipase: 51 U/L (ref 11–51)

## 2020-07-12 IMAGING — CT CT ABD-PELV W/ CM
2 of 5 series · 16 of 46 positions shown, 18 images · IV contrast (Omnipaque)
Comparison: [DATE]

CLINICAL DATA: Right lower quadrant abdominal pain, liposuction 2
months prior

EXAM:
CT ABDOMEN AND PELVIS WITH CONTRAST
TECHNIQUE: Multidetector CT imaging of the abdomen and pelvis was performed
using the standard protocol following bolus administration of
intravenous contrast.
CONTRAST:  100mL OMNIPAQUE IOHEXOL 300 MG/ML  SOLN

[Series 2: axial st · axial · 0.73mm/px · z∈[-678,-268]mm · 13 of 92 slices shown, 15 images]
[im 5/92  soft-tissue]
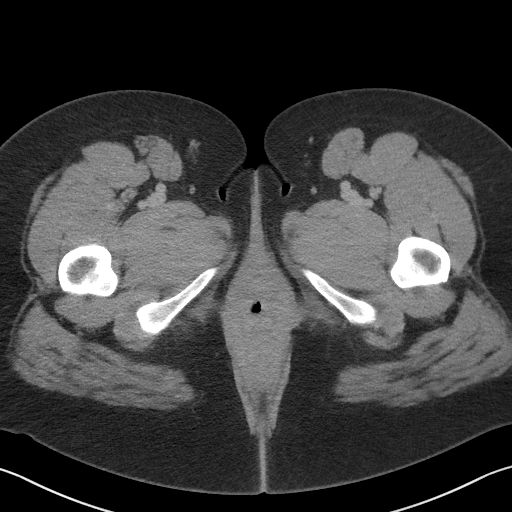
[im 5/92  bone]
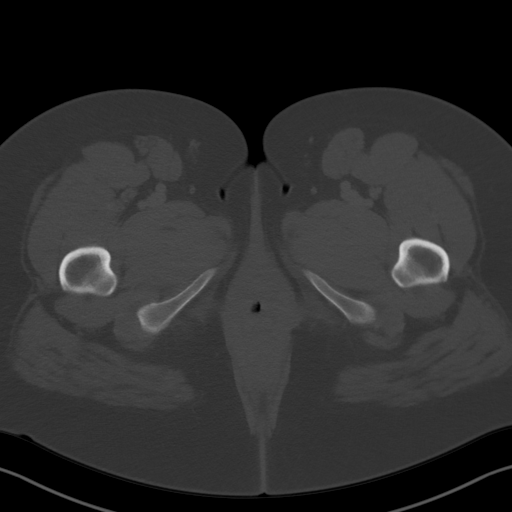
[im 15/92  soft-tissue]
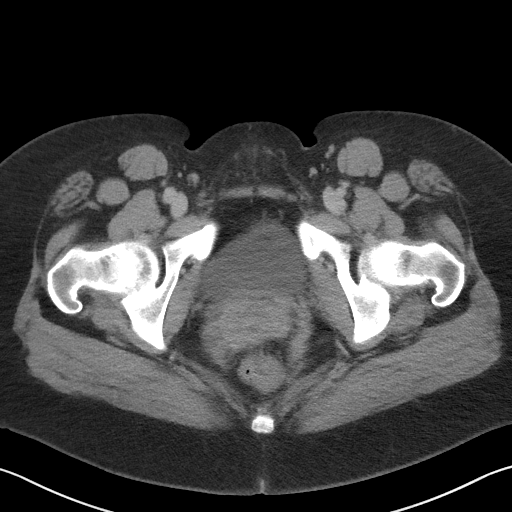
[im 20/92  soft-tissue]
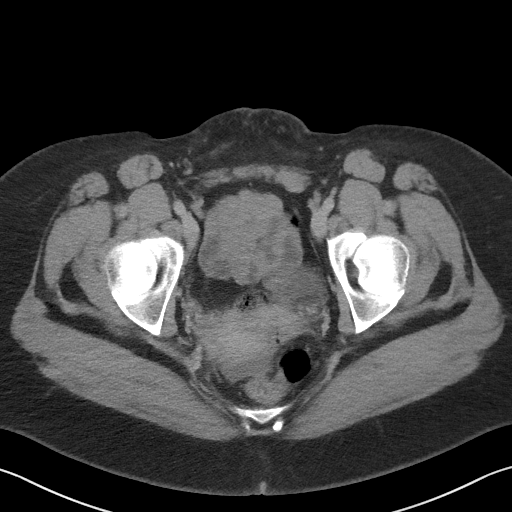
[im 24/92  soft-tissue]
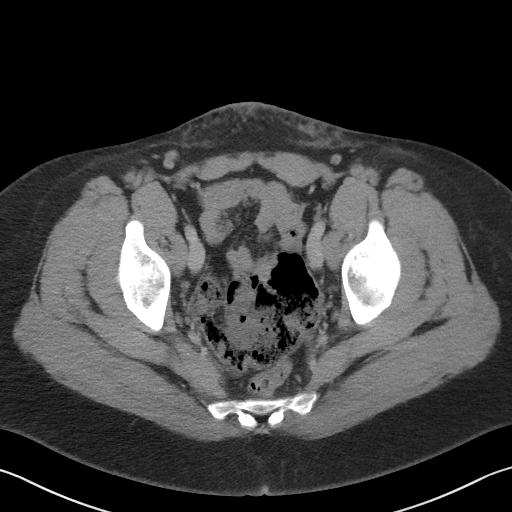
[im 34/92  soft-tissue]
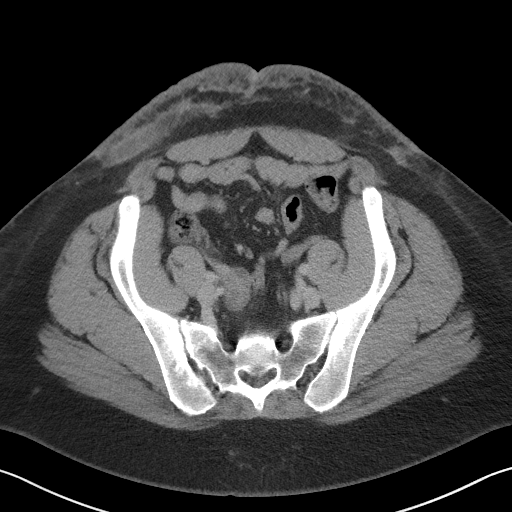
[im 39/92  soft-tissue]
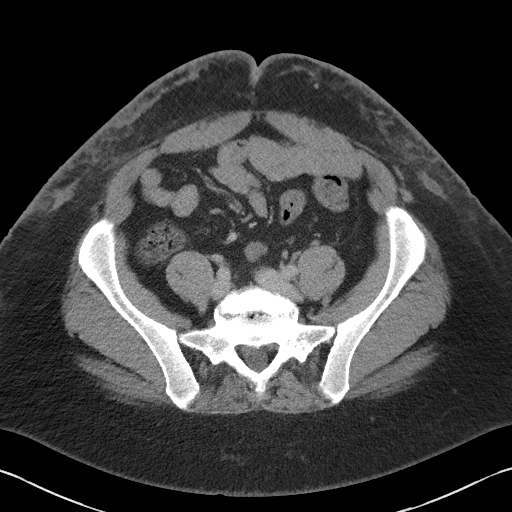
[im 48/92  soft-tissue]
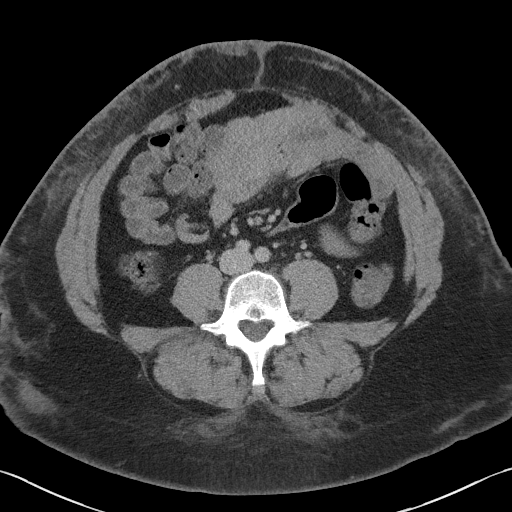
[im 53/92  soft-tissue]
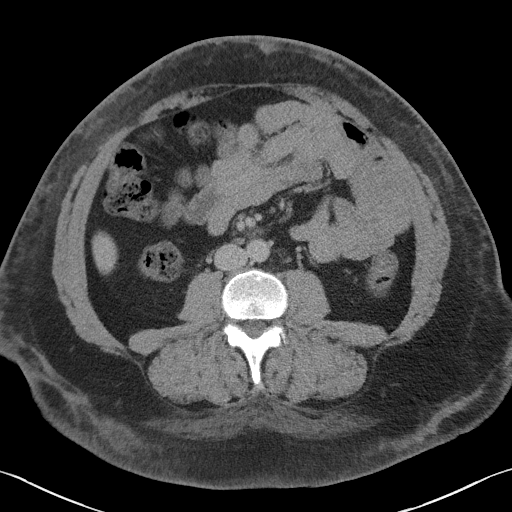
[im 58/92  soft-tissue]
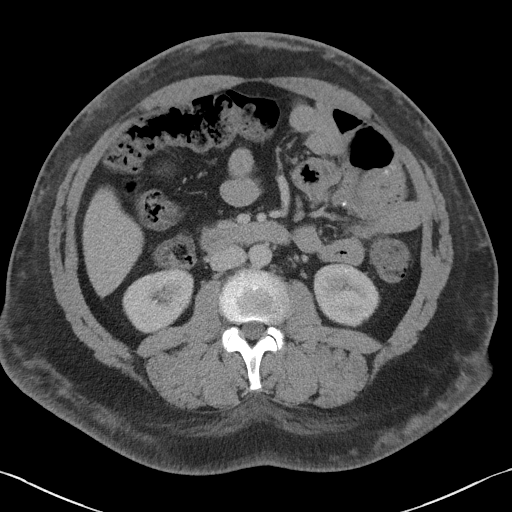
[im 58/92  bone]
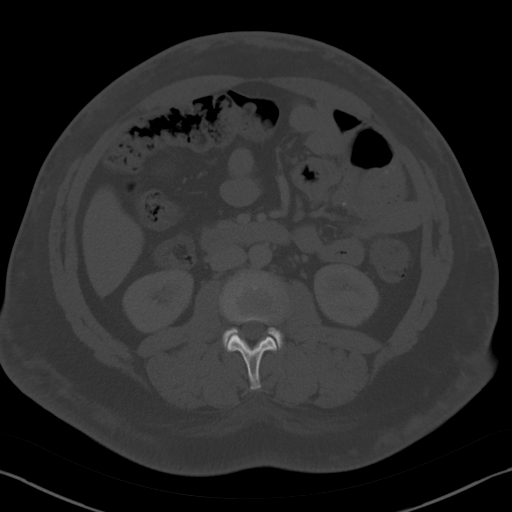
[im 68/92  soft-tissue]
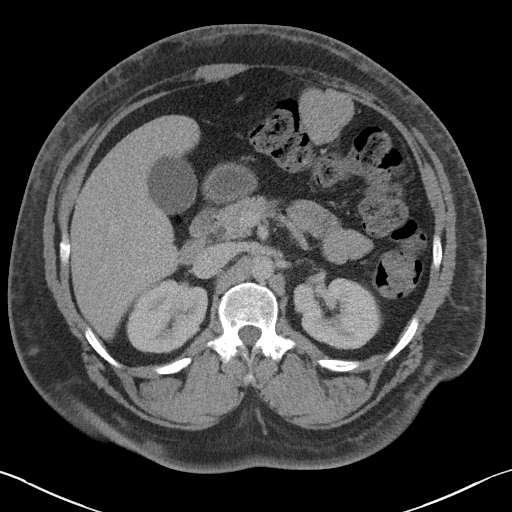
[im 72/92  soft-tissue]
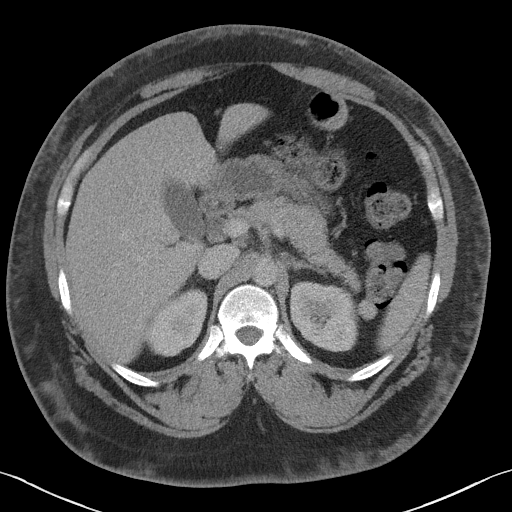
[im 77/92  soft-tissue]
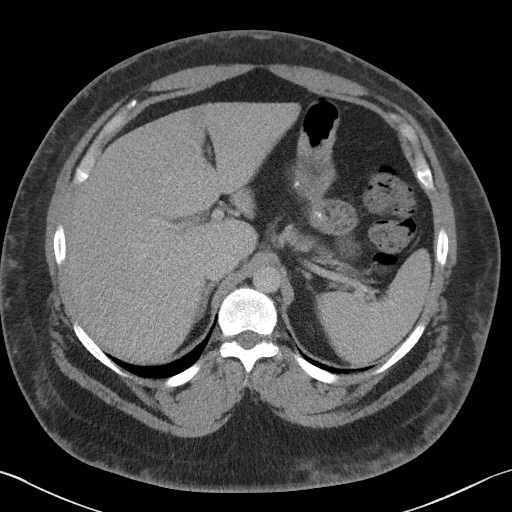
[im 87/92  soft-tissue]
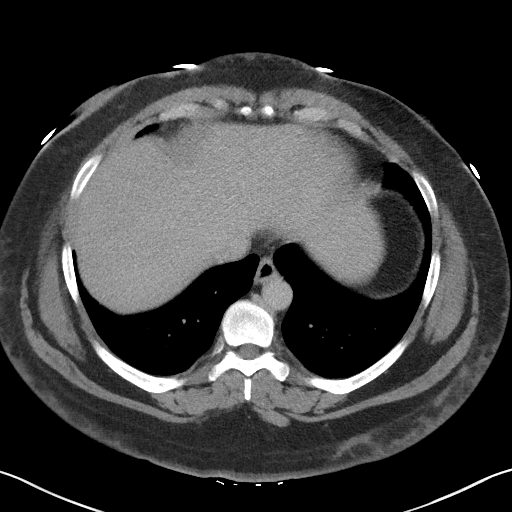

[Series 5: coronal st · coronal · 0.78mm/px · 3 of 119 slices shown]
[im 40/119  soft-tissue]
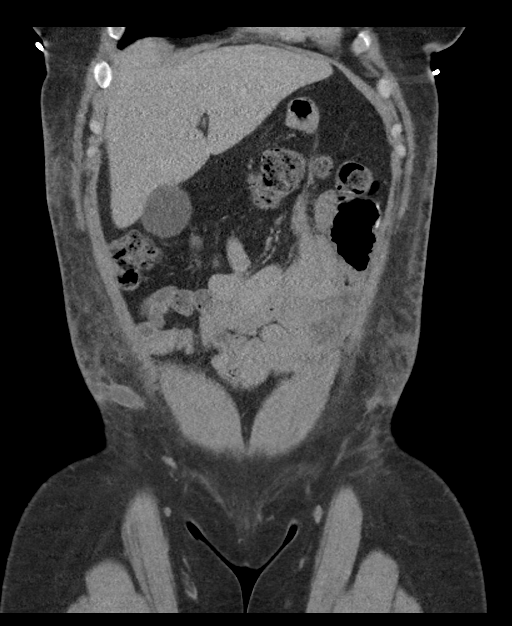
[im 53/119  soft-tissue]
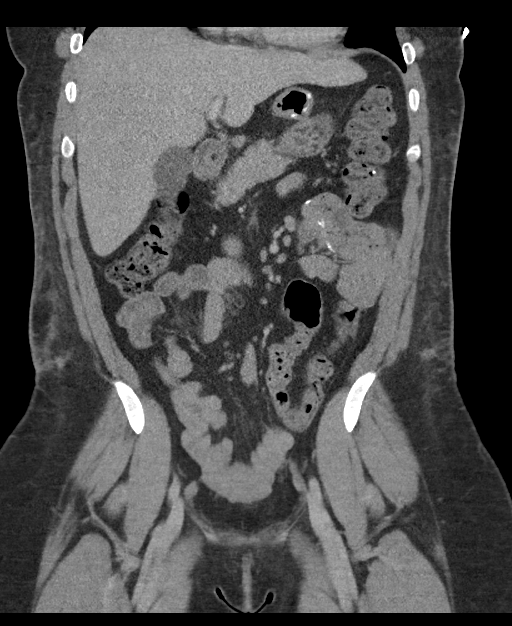
[im 66/119  soft-tissue]
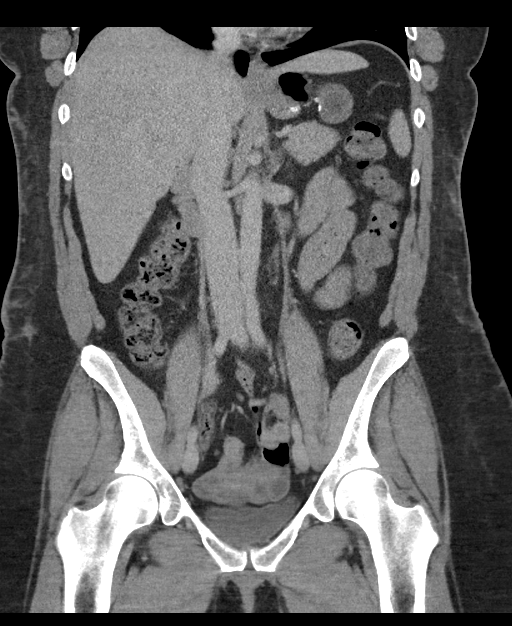

[16 of 46 positions shown; findings below may reference images not displayed]

FINDINGS: Lower chest: The visualized lung bases are clear bilaterally. The
visualized heart and pericardium are unremarkable.

Hepatobiliary: No focal liver abnormality is seen. No gallstones,
gallbladder wall thickening, or biliary dilatation.

Pancreas: Unremarkable

Spleen: Unremarkable

Adrenals/Urinary Tract: Adrenal glands are unremarkable. Kidneys are
normal, without renal calculi, focal lesion, or hydronephrosis.
Bladder is unremarkable.

Stomach/Bowel: Surgical changes of Roux-en-Y gastric bypass are
identified. The stomach, small bowel, and large bowel are otherwise
unremarkable. The appendix is well visualized and is normal. No free
intraperitoneal gas. Trace free fluid within the pelvis is
nonspecific in a female patient of this age.

Vascular/Lymphatic: No significant vascular findings are present. No
enlarged abdominal or pelvic lymph nodes.

Reproductive: Status post hysterectomy. No adnexal masses.

Other: Rectum unremarkable. There is extensive nodular soft tissue
infiltration of the subcutaneous fat of the abdominal wall and
flanks bilaterally in keeping with the given history recent
liposuction surgery. Small pancake like subcutaneous loculated
collection is seen within the right lower quadrant anterior
abdominal wall, measuring roughly 13.5 x 2.1 cm in greatest
dimension, possibly representing complex postoperative or
hemorrhagic fluid. Super infection, however, cannot be excluded on
this examination.

Musculoskeletal: No acute bone abnormality.
IMPRESSION: Nodular soft tissue throughout the subcutaneous fat of the abdominal
wall and flanks bilaterally in keeping with given history of
liposuction surgery. Focal pancake like subcutaneous complex fluid
collection within the right lower quadrant may represent complex
postoperative or hemorrhagic fluid. Note that super infection cannot
be excluded on this examination alone and clinical correlation for
evidence of local inflammatory changes recommended.

## 2020-07-12 MED ORDER — ONDANSETRON HCL 4 MG/2ML IJ SOLN
4.0000 mg | Freq: Once | INTRAMUSCULAR | Status: AC
  Administered 2020-07-12: 4 mg via INTRAVENOUS
  Filled 2020-07-12: qty 2

## 2020-07-12 MED ORDER — FENTANYL CITRATE (PF) 100 MCG/2ML IJ SOLN
50.0000 ug | Freq: Once | INTRAMUSCULAR | Status: AC
  Administered 2020-07-12: 50 ug via INTRAVENOUS
  Filled 2020-07-12: qty 2

## 2020-07-12 MED ORDER — IOHEXOL 300 MG/ML  SOLN
100.0000 mL | Freq: Once | INTRAMUSCULAR | Status: AC
  Administered 2020-07-12: 100 mL via INTRAVENOUS

## 2020-07-12 MED ORDER — OXYCODONE-ACETAMINOPHEN 5-325 MG PO TABS
1.0000 | ORAL_TABLET | Freq: Four times a day (QID) | ORAL | 0 refills | Status: DC | PRN
Start: 2020-07-12 — End: 2020-12-26

## 2020-07-12 NOTE — ED Triage Notes (Signed)
C/o right abd pain x 1 day , denies n/v/d , increased pain with walking

## 2020-07-12 NOTE — ED Provider Notes (Signed)
[facility] EMERGENCY DEPARTMENT Provider Note   CSN: [id number] Arrival date & time: 07/12/20  1516     History Chief Complaint  Patient presents with  . Abdominal Pain    Ashley Stephens is a 44 y.o. female.  The history is provided by the patient. No language interpreter was used.  Abdominal Pain  Ashley Stephens is a 44 y.o. female who presents to the Emergency Department complaining of abdominal pain. She presents the emergency department complaining of severe right-sided abdominal pain at that started yesterday. Pain is sharp in nature. It is nonradiating. She denies any fevers, nausea, vomiting, diarrhea, dysuria. She has a history of gastric bypass, partial hysterectomy, abdominoplasty. No prior similar symptoms. Pain is described as severe and constant in nature.    Past Medical History:  Diagnosis Date  . Anxiety   . Depression   . Gastric ulcer   . GERD (gastroesophageal reflux disease)   . Hypertension   . Osteoarthritis   . Thyroid disorder     Patient Active Problem List   Diagnosis Date Noted  . Major depressive disorder, recurrent episode, severe ([place]) 01/27/2020  . DDD (degenerative disc disease), lumbar 12/16/2019  . B12 deficiency 12/16/2019  . Encounter for general adult medical examination with abnormal findings 07/12/2019  . Borderline diabetes 07/12/2019  . Gastroesophageal reflux disease without esophagitis 07/12/2019  . Encounter for breast cancer screening other than mammogram 07/12/2019  . Dysuria 07/12/2019  . Mild mood disorder ([place]) 06/07/2018  . Generalized anxiety disorder 06/07/2018  . Type 2 diabetes mellitus with hyperglycemia, without long-term current use of insulin ([place]) 12/14/2017  . Impaired fasting glucose 12/14/2017  . Fatigue 12/14/2017  . Abnormal weight gain 12/14/2017  . Vitamin D deficiency 12/14/2017  . Acquired hypothyroidism 12/14/2017    Past Surgical History:  Procedure Laterality Date    . ABDOMINAL HYSTERECTOMY  2013  . BARIATRIC SURGERY    . BREAST BIOPSY  1995  . GASTRIC BYPASS  2012  . TUBAL LIGATION  1996  . tummy tuck  05/27/2020     OB History    Gravida  2   Para  2   Term      Preterm      AB      Living  2     SAB      TAB      Ectopic      Multiple      Live Births           Obstetric Comments  1st Menstrual Cycle:  10 1st Pregnancy:  50        Family History  Problem Relation Age of Onset  . Breast cancer Maternal Grandmother     Social History   Tobacco Use  . Smoking status: Never Smoker  . Smokeless tobacco: Never Used  Vaping Use  . Vaping Use: Never used  Substance Use Topics  . Alcohol use: No    Alcohol/week: 0.0 standard drinks  . Drug use: No    Home Medications Prior to Admission medications   Medication Sig Start Date End Date Taking? Authorizing Provider  ALPRAZolam (XANAX) 0.25 MG tablet Take 1 tablet (0.25 mg total) by mouth 2 (two) times daily as needed. for anxiety 07/10/20   [name], PMHNP  cephALEXin (KEFLEX) 500 MG capsule Take 1 capsule (500 mg total) by mouth 3 (three) times daily. Patient not taking: Reported on 07/08/2020 05/24/20   Lavera Guise, MD  cholecalciferol (  VITAMIN D3) 25 MCG (1000 UNIT) tablet Take 1,000 Units by mouth daily.    [provider]  Cyanocobalamin (VITAMIN B 12 PO) Take by mouth.    [provider]  desvenlafaxine (PRISTIQ) 100 MG 24 hr tablet Take 1 tablet (100 mg total) by mouth every morning. 04/11/20   [name], PMHNP  desvenlafaxine (PRISTIQ) 50 MG 24 hr tablet Take 1 tablet (50 mg total) by mouth every evening. 04/11/20   [name], PMHNP  Dulaglutide (TRULICITY) 1.5 RS/8.5IO SOPN Inject 1.5 mg into the skin once a week. 06/17/20   Lavera Guise, MD  hydrOXYzine (VISTARIL) 50 MG capsule Take 1 capsule by mouth three times daily as needed 07/08/20   [name], PMHNP  lamoTRIgine (LAMICTAL) 150 MG tablet Take 1 tablet (150 mg  total) by mouth 2 (two) times daily. 05/12/20   [name], PMHNP  levothyroxine (SYNTHROID) 25 MCG tablet Take 1 tablet (25 mcg total) by mouth daily before breakfast. 12/03/19   Ronnell Freshwater, NP  lurasidone (LATUDA) 80 MG TABS tablet Take 1 tablet (80 mg total) by mouth daily with breakfast. 07/08/20 08/07/20  [name], PMHNP  Oxcarbazepine (TRILEPTAL) 300 MG tablet Take 0.5 tablets (150 mg total) by mouth every morning AND 1 tablet (300 mg total) at bedtime. 07/08/20 10/06/20  [name], PMHNP  oxyCODONE-acetaminophen (PERCOCET/ROXICET) 5-325 MG tablet Take 1 tablet by mouth every 6 (six) hours as needed for severe pain. 07/12/20   Quintella Reichert, MD  propranolol (INDERAL) 20 MG tablet Take 1 tablet (20 mg total) by mouth 3 (three) times daily as needed. 04/11/20 07/10/20  [name], [place]    Allergies    Patient has no known allergies.  Review of Systems   Review of Systems  Gastrointestinal: Positive for abdominal pain.  All other systems reviewed and are negative.   Physical Exam Updated Vital Signs BP 125/81 (BP Location: Left Arm)   Pulse 84   Temp 98.2 F (36.8 C)   Resp 18   Ht 5' 7.5" (1.715 m)   Wt 113.4 kg   SpO2 97%   BMI 38.58 kg/m   Physical Exam Vitals and nursing note reviewed.  Constitutional:      Appearance: She is well-developed.  HENT:     Head: Normocephalic and atraumatic.  Cardiovascular:     Rate and Rhythm: Normal rate and regular rhythm.     Heart sounds: No murmur heard.   Pulmonary:     Effort: Pulmonary effort is normal. No respiratory distress.     Breath sounds: Normal breath sounds.  Abdominal:     Palpations: Abdomen is soft.     Tenderness: There is abdominal tenderness. There is no guarding or rebound.     Comments: Moderate right-sided abdominal tenderness  Musculoskeletal:        General: No tenderness.  Skin:    General: Skin is warm and dry.  Neurological:     Mental Status: She is alert and oriented to  person, place, and time.  Psychiatric:        Behavior: Behavior normal.     ED Results / Procedures / Treatments   Labs (all labs ordered are listed, but only abnormal results are displayed) Labs Reviewed  URINALYSIS, ROUTINE W REFLEX MICROSCOPIC - Abnormal; Notable for the following components:      Result Value   APPearance HAZY (*)    All other components within normal limits  COMPREHENSIVE METABOLIC PANEL - Abnormal; Notable for the following  components:   Total Bilirubin 0.2 (*)    All other components within normal limits  CBC WITH DIFFERENTIAL/PLATELET  LIPASE, BLOOD    EKG None  Radiology CT Abdomen Pelvis W Contrast  Result Date: 07/12/2020 CLINICAL DATA:  Right lower quadrant abdominal pain, liposuction 2 months prior EXAM: CT ABDOMEN AND PELVIS WITH CONTRAST TECHNIQUE: Multidetector CT imaging of the abdomen and pelvis was performed using the standard protocol following bolus administration of intravenous contrast. CONTRAST:  122mL OMNIPAQUE IOHEXOL 300 MG/ML  SOLN COMPARISON:  12/27/2011 FINDINGS: Lower chest: The visualized lung bases are clear bilaterally. The visualized heart and pericardium are unremarkable. Hepatobiliary: No focal liver abnormality is seen. No gallstones, gallbladder wall thickening, or biliary dilatation. Pancreas: Unremarkable Spleen: Unremarkable Adrenals/Urinary Tract: Adrenal glands are unremarkable. Kidneys are normal, without renal calculi, focal lesion, or hydronephrosis. Bladder is unremarkable. Stomach/Bowel: Surgical changes of Roux-en-Y gastric bypass are identified. The stomach, small bowel, and large bowel are otherwise unremarkable. The appendix is well visualized and is normal. No free intraperitoneal gas. Trace free fluid within the pelvis is nonspecific in a female patient of this age. Vascular/Lymphatic: No significant vascular findings are present. No enlarged abdominal or pelvic lymph nodes. Reproductive: Status post hysterectomy. No  adnexal masses. Other: Rectum unremarkable. There is extensive nodular soft tissue infiltration of the subcutaneous fat of the abdominal wall and flanks bilaterally in keeping with the given history recent liposuction surgery. Small pancake like subcutaneous loculated collection is seen within the right lower quadrant anterior abdominal wall, measuring roughly 13.5 x 2.1 cm in greatest dimension, possibly representing complex postoperative or hemorrhagic fluid. Super infection, however, cannot be excluded on this examination. Musculoskeletal: No acute bone abnormality. IMPRESSION: Nodular soft tissue throughout the subcutaneous fat of the abdominal wall and flanks bilaterally in keeping with given history of liposuction surgery. Focal pancake like subcutaneous complex fluid collection within the right lower quadrant may represent complex postoperative or hemorrhagic fluid. Note that super infection cannot be excluded on this examination alone and clinical correlation for evidence of local inflammatory changes recommended. Electronically Signed   By: Fidela Salisbury MD   On: 07/12/2020 18:16    Procedures Procedures (including critical care time)  Medications Ordered in ED Medications  fentaNYL (SUBLIMAZE) injection 50 mcg (50 mcg Intravenous Given 07/12/20 1729)  ondansetron (ZOFRAN) injection 4 mg (4 mg Intravenous Given 07/12/20 1728)  iohexol (OMNIPAQUE) 300 MG/ML solution 100 mL (100 mLs Intravenous Contrast Given 07/12/20 1748)    ED Course  I have reviewed the triage vital signs and the nursing notes.  Pertinent labs & imaging results that were available during my care of the patient were reviewed by me and considered in my medical decision making (see chart for details).    MDM Rules/Calculators/A&P                         patient here for evaluation of right lower quadrant pain that started yesterday. She has tenderness on examination without peritoneal findings. Labs are within normal  limits. CT abdomen pelvis was obtained, which demonstrates a fluid collection in the right lower quadrant. Current clinical picture is not consistent with acute infectious process. Fluid collection likely due to hematoma or post op seroma. Discussed with patient wearing her binder with close surgery follow-up. Return precautions discussed.  Final Clinical Impression(s) / ED Diagnoses Final diagnoses:  Abdominal wall hematoma, initial encounter    Rx / DC Orders ED Discharge Orders  Ordered    oxyCODONE-acetaminophen (PERCOCET/ROXICET) 5-325 MG tablet  Every 6 hours PRN        07/12/20 1854           Quintella Reichert, MD 07/12/20 1928

## 2020-07-12 NOTE — Discharge Instructions (Addendum)
Please follow up with your surgeon for recheck.  Narrative:   CLINICAL DATA:  Right lower quadrant abdominal pain, liposuction 2  months prior   EXAM:  CT ABDOMEN AND PELVIS WITH CONTRAST   TECHNIQUE:  Multidetector CT imaging of the abdomen and pelvis was performed  using the standard protocol following bolus administration of  intravenous contrast.   CONTRAST:  159mL OMNIPAQUE IOHEXOL 300 MG/ML  SOLN   COMPARISON:  12/27/2011   FINDINGS:  Lower chest: The visualized lung bases are clear bilaterally. The  visualized heart and pericardium are unremarkable.   Hepatobiliary: No focal liver abnormality is seen. No gallstones,  gallbladder wall thickening, or biliary dilatation.   Pancreas: Unremarkable   Spleen: Unremarkable   Adrenals/Urinary Tract: Adrenal glands are unremarkable. Kidneys are  normal, without renal calculi, focal lesion, or hydronephrosis.  Bladder is unremarkable.   Stomach/Bowel: Surgical changes of Roux-en-Y gastric bypass are  identified. The stomach, small bowel, and large bowel are otherwise  unremarkable. The appendix is well visualized and is normal. No free  intraperitoneal gas. Trace free fluid within the pelvis is  nonspecific in a female patient of this age.   Vascular/Lymphatic: No significant vascular findings are present. No  enlarged abdominal or pelvic lymph nodes.   Reproductive: Status post hysterectomy. No adnexal masses.   Other: Rectum unremarkable. There is extensive nodular soft tissue  infiltration of the subcutaneous fat of the abdominal wall and  flanks bilaterally in keeping with the given history recent  liposuction surgery. Small pancake like subcutaneous loculated  collection is seen within the right lower quadrant anterior  abdominal wall, measuring roughly 13.5 x 2.1 cm in greatest  dimension, possibly representing complex postoperative or  hemorrhagic fluid. Super infection, however, cannot be excluded on  this  examination.   Musculoskeletal: No acute bone abnormality.   IMPRESSION:  Nodular soft tissue throughout the subcutaneous fat of the abdominal  wall and flanks bilaterally in keeping with given history of  liposuction surgery. Focal pancake like subcutaneous complex fluid  collection within the right lower quadrant may represent complex  postoperative or hemorrhagic fluid. Note that super infection cannot  be excluded on this examination alone and clinical correlation for  evidence of local inflammatory changes recommended.

## 2020-07-15 ENCOUNTER — Ambulatory Visit (INDEPENDENT_AMBULATORY_CARE_PROVIDER_SITE_OTHER): Payer: 59 | Admitting: Psychiatry

## 2020-07-15 DIAGNOSIS — F411 Generalized anxiety disorder: Secondary | ICD-10-CM | POA: Diagnosis not present

## 2020-07-15 DIAGNOSIS — R69 Illness, unspecified: Secondary | ICD-10-CM | POA: Diagnosis not present

## 2020-07-15 NOTE — Progress Notes (Signed)
[facility] Counselor/Therapist Progress Note  Patient ID: Ashley Stephens, MRN: [id number],    Date: 07/15/2020  Time Spent: 50 minutes   8:00am to 9:50am  Virtual Visit Note via [place] with patient by a video enabled telemedicine/telehealth application or telephone, with their informed consent, and verified patient privacy and that I am speaking with the correct person using two identifiers. I discussed the limitations, risks, security and privacy concerns of performing psychotherapy and management service by telephone and the availability of in person appointments. I also discussed with the patient that there may be a patient responsible charge related to this service. The patient expressed understanding and agreed to proceed. I discussed the treatment planning with the patient. The patient was provided an opportunity to ask questions and all were answered. The patient agreed with the plan and demonstrated an understanding of the instructions. The patient was advised to call  our office if  symptoms worsen or feel they are in a crisis state and need immediate contact.   Therapist Location: [facility] Patient Location: home   Treatment Type: Individual Therapy  Reported Symptoms: anxiety, depression (with depression being the strongest symptom) mostly due to work and family  Mental Status Exam:  Appearance:   Casual     Behavior:  Appropriate, Sharing and Motivated  Motor:  Normal  Speech/Language:   Clear and Coherent  Affect:  anxious, depressed  Mood:  anxious and depressed  Thought process:  goal directed  Thought content:    some obsessiveness  Sensory/Perceptual disturbances:    WNL  Orientation:  oriented to person, place, time/date, situation, day of week, month of year and year  Attention:  Good  Concentration:  Fair  Memory:  [place] of knowledge:   Good  Insight:    Fair  Judgment:   Fair  Impulse Control:  Good   Risk  Assessment: Danger to Self:  No Self-injurious Behavior: No Danger to Others: No Duty to Warn:no Physical Aggression / Violence:No  Access to Firearms a concern: No  Gang Involvement:No   Subjective: Patient today reports depression as her main symptom, and also anxiety.  Symptoms she reports are mostly due to family and work situations.  Interventions: Cognitive Behavioral Therapy, Solution-Oriented/Positive Psychology and Ego-Supportive  Diagnosis:   ICD-10-CM   1. Generalized anxiety disorder  F41.1      Plan: Patient not signing tx plan on computer screen due to [place].  Treatment Goals: Patient not signing tx plan on computer screen due to [place].  Long term goal: Develop the ability to recognize, accept, and cope with feelings of depression and anxiety.  Short term goal: Learn and implement personal skills for managing stress, solving daily problems, and resolving conflicts effectively.  Strategy: Teach patient calming strategies, problem solving skills, and conflict resolution skills to better manage daily stressors.  Progressing- Patient today reporting anxiety and depression.  Depression she reports is her main symptom and is coming from work and family issues.  Also having some health issues due to recent surgery and having to return to that doctor for follow up due to fluid build up. Patient very frustrated but in talking through her frustration, she became calmer and more grounded. Processed some more recent issues with family, particuarly adult son and adult daughter which create increased anxiety and aggravation for patient, yet it has been difficult for her to set limits with adult children and stick to them. Patient's mom interferes and doesn't help the  situation. Feeling very alone at times and limited support from family/friends. "They tell me what I did wrong versus what I do right."  "I know I'm 44 but I need more self control, and self confidence.  Agreed  that she'd like to follow up on this more next session.  Encouraged positive self-care with patient including staying in the present versus the past or too far into the future, getting outside some each day, looking for more positives and negatives each day, being in touch with supportive people, working more to let go of guilt, keeping her self expectations realistic, setting appropriate boundaries with people, healthy nutrition, taking breaks during the day, and making her self talk more positive and affirming.  Goal review and progress/challenges noted with patient.  Next appointment within 2 weeks.   [name], LCSW

## 2020-08-12 ENCOUNTER — Other Ambulatory Visit: Payer: Self-pay

## 2020-08-12 ENCOUNTER — Encounter: Payer: Self-pay | Admitting: Psychiatry

## 2020-08-12 ENCOUNTER — Ambulatory Visit (INDEPENDENT_AMBULATORY_CARE_PROVIDER_SITE_OTHER): Payer: 59 | Admitting: Psychiatry

## 2020-08-12 DIAGNOSIS — R69 Illness, unspecified: Secondary | ICD-10-CM | POA: Diagnosis not present

## 2020-08-12 DIAGNOSIS — F5101 Primary insomnia: Secondary | ICD-10-CM | POA: Diagnosis not present

## 2020-08-12 DIAGNOSIS — F39 Unspecified mood [affective] disorder: Secondary | ICD-10-CM

## 2020-08-12 DIAGNOSIS — F411 Generalized anxiety disorder: Secondary | ICD-10-CM | POA: Diagnosis not present

## 2020-08-12 MED ORDER — PROPRANOLOL HCL 20 MG PO TABS: 20.0000 mg | ORAL_TABLET | Freq: Three times a day (TID) | ORAL | 1 refills | Status: DC | PRN

## 2020-08-12 MED ORDER — DESVENLAFAXINE SUCCINATE ER 50 MG PO TB24: 50.0000 mg | ORAL_TABLET | Freq: Every evening | ORAL | 1 refills | Status: DC

## 2020-08-12 MED ORDER — LURASIDONE HCL 80 MG PO TABS: 80.0000 mg | ORAL_TABLET | Freq: Every day | ORAL | 0 refills | Status: DC

## 2020-08-12 MED ORDER — HYDROXYZINE PAMOATE 50 MG PO CAPS: ORAL_CAPSULE | ORAL | 0 refills | Status: DC

## 2020-08-12 MED ORDER — DESVENLAFAXINE SUCCINATE ER 100 MG PO TB24: 100.0000 mg | ORAL_TABLET | ORAL | 1 refills | Status: DC

## 2020-08-12 MED ORDER — OXCARBAZEPINE 300 MG PO TABS: 300.0000 mg | ORAL_TABLET | Freq: Two times a day (BID) | ORAL | 0 refills | Status: DC

## 2020-08-12 NOTE — Progress Notes (Signed)
Jobeth Pangilinan [id number] 11-28-1975 44 y.o.  Subjective:   Patient ID:  Ashley Stephens is a 44 y.o. (DOB 06/22/1976) female.  Chief Complaint:  Chief Complaint  Patient presents with  . Anxiety  . Depression  . Sleeping Problem    HPI Ashley Stephens presents to the office today for follow-up of anxiety, mood disturbance, and sleep disturbance. Ashley Stephens reports that anxiety has been "about the same, really high." Denies any panic s/s. Ashley Stephens report that depression is "the same." Ashley Stephens reports "irritability comes and goes, depending on the situation." Ashley Stephens reports that Ashley Stephens usually gets up once a night around 3-5 am and is awake for 30-60 minutes. Appetite has been increased. Energy and motivation have been "up and down." Ashley Stephens reports that Ashley Stephens is tired. Reports that Ashley Stephens has to push herself to do things, such as running errands today on her day off. Ashley Stephens reports that concentration "comes and goes." Denies impulsive or risky behavior.   Ashley Stephens reports that Ashley Stephens continues to have intrusive memories about traumatic experiences. Occ re-experiencing of traumatic experience. Denies nightmares. "When bad times come, I tend to fixate on it." Denies SI.   Ashley Stephens reports that work has been a major stressor and causes significant anxiety. Ashley Stephens reports that callers have a 45-60 minute hold time due to being short-staffed and that many callers are angry at the start of the call. Ashley Stephens reports that callers have been ranting at her and this causes anxiety and depression.   Ashley Stephens reports that Ashley Stephens is taking Trileptal 300 mg in the middle of the day and at bedtime "and it helps" with anxiety, mood, and irritability before starting work.    Past medication trials: Wellbutrin Effexor Pristiq-effective Lamictal-effective for mood Latuda Xanax- Usually takes at night Propanolol- Effective Hydroxyzine Rexulti Gabapentin- prescribed for back pain. Dizziness, headaches.  Lithium Trileptal  [place] Office Visit from 08/12/2020 in [place] Office Visit from 04/11/2020 in [place] Visit from 11/27/2019 in [place] Visit from 08/14/2019 in [place] Visit from 05/29/2019 in [place] Total Score 0 0 0 0 0    GAD-7   [place] Office Visit from 12/25/2019 in [place]  Total GAD-7 Score 16    PHQ2-9   [place] Office Visit from 05/24/2020 in [facility], [facility] Office Visit from 04/05/2020 in [facility] [place] from 12/25/2019 in [place] Visit from 12/03/2019 in [facility], [facility] Office Visit from 06/26/2019 in [facility], [facility]  PHQ-2 Total Score 0 0 5 0 0  PHQ-9 Total Score -- -- 18 -- --       Review of Systems:  Review of Systems  Gastrointestinal:       Reports improved abdominal pain  Musculoskeletal: Negative for gait problem.  Neurological: Negative for tremors.  Psychiatric/Behavioral:       Please refer to HPI    Medications: I have reviewed the patient's current medications.  Current Outpatient Medications  Medication Sig Dispense Refill  . ALPRAZolam (XANAX) 0.25 MG tablet Take 1 tablet (0.25 mg total) by mouth 2 (two) times daily as needed. for anxiety 45 tablet 2  . cephALEXin (KEFLEX) 500 MG capsule Take 1 capsule (500 mg total) by mouth 3 (three) times daily. (Patient not taking: Reported on 07/08/2020) 15 capsule 0  . cholecalciferol (VITAMIN D3) 25 MCG (1000 UNIT) tablet Take 1,000 Units by  mouth daily.    . Cyanocobalamin (VITAMIN B 12 PO) Take by mouth.    . desvenlafaxine (PRISTIQ) 100 MG 24 hr tablet Take 1 tablet (100 mg total) by mouth every morning. 90 tablet 1  . desvenlafaxine (PRISTIQ) 50 MG 24 hr tablet Take 1 tablet (50 mg total) by mouth every evening. 90 tablet 1  . Dulaglutide (TRULICITY) 1.5 OY/7.7AJ SOPN  Inject 1.5 mg into the skin once a week. 3 mL 12  . hydrOXYzine (VISTARIL) 50 MG capsule Take 1 capsule by mouth three times daily as needed 90 capsule 0  . lamoTRIgine (LAMICTAL) 150 MG tablet Take 1 tablet (150 mg total) by mouth 2 (two) times daily. 180 tablet 1  . levothyroxine (SYNTHROID) 25 MCG tablet Take 1 tablet (25 mcg total) by mouth daily before breakfast. 90 tablet 2  . lurasidone (LATUDA) 80 MG TABS tablet Take 1 tablet (80 mg total) by mouth daily with breakfast. 90 tablet 0  . Oxcarbazepine (TRILEPTAL) 300 MG tablet Take 1 tablet (300 mg total) by mouth 2 (two) times daily. 180 tablet 0  . oxyCODONE-acetaminophen (PERCOCET/ROXICET) 5-325 MG tablet Take 1 tablet by mouth every 6 (six) hours as needed for severe pain. 5 tablet 0  . propranolol (INDERAL) 20 MG tablet Take 1 tablet (20 mg total) by mouth 3 (three) times daily as needed. 270 tablet 1   No current facility-administered medications for this visit.    Medication Side Effects: None  Allergies: No Known Allergies  Past Medical History:  Diagnosis Date  . Anxiety   . Depression   . Gastric ulcer   . GERD (gastroesophageal reflux disease)   . Hypertension   . Osteoarthritis   . Thyroid disorder     Family History  Problem Relation Age of Onset  . Breast cancer Maternal Grandmother     Social History   Socioeconomic History  . Marital status: Single    Spouse name: Not on file  . Number of children: Not on file  . Years of education: Not on file  . Highest education level: Not on file  Occupational History  . Not on file  Tobacco Use  . Smoking status: Never Smoker  . Smokeless tobacco: Never Used  Vaping Use  . Vaping Use: Never used  Substance and Sexual Activity  . Alcohol use: No    Alcohol/week: 0.0 standard drinks  . Drug use: No  . Sexual activity: Yes    Birth control/protection: Surgical  Other Topics Concern  . Not on file  Social History Narrative  . Not on file   Social  Determinants of Health   Financial Resource Strain: Not on file  Food Insecurity: Not on file  Transportation Needs: Not on file  Physical Activity: Not on file  Stress: Not on file  Social Connections: Not on file  Intimate Partner Violence: Not on file    Past Medical History, Surgical history, Social history, and Family history were reviewed and updated as appropriate.   Please see review of systems for further details on the patient's review from today.   Objective:   Physical Exam:  There were no vitals taken for this visit.  Physical Exam Constitutional:      General: Ashley Stephens is not in acute distress. Musculoskeletal:        General: No deformity.  Neurological:     Mental Status: Ashley Stephens is alert and oriented to person, place, and time.     Coordination: Coordination normal.  Psychiatric:  Attention and Perception: Attention and perception normal. Ashley Stephens does not perceive auditory or visual hallucinations.        Mood and Affect: Mood is anxious and depressed. Affect is not labile, blunt, angry or inappropriate.        Speech: Speech normal.        Behavior: Behavior normal.        Thought Content: Thought content normal. Thought content is not paranoid or delusional. Thought content does not include homicidal or suicidal ideation. Thought content does not include homicidal or suicidal plan.        Cognition and Memory: Cognition and memory normal.        Judgment: Judgment normal.     Comments: Insight intact     Lab Review:     Component Value Date/Time   NA 138 07/12/2020 1526   NA 139 07/10/2019 0953   NA 142 12/03/2011 1059   K 4.1 07/12/2020 1526   K 3.4 (L) 12/03/2011 1059   CL 103 07/12/2020 1526   CL 107 12/03/2011 1059   CO2 25 07/12/2020 1526   CO2 27 12/03/2011 1059   GLUCOSE 92 07/12/2020 1526   GLUCOSE 66 12/03/2011 1059   BUN 7 07/12/2020 1526   BUN 9 07/10/2019 0953   BUN 4 (L) 12/03/2011 1059   CREATININE 0.97 07/12/2020 1526   CREATININE  0.82 12/03/2011 1059   CALCIUM 8.9 07/12/2020 1526   CALCIUM 8.5 12/03/2011 1059   PROT 7.1 07/12/2020 1526   PROT 6.5 07/10/2019 0953   ALBUMIN 4.2 07/12/2020 1526   ALBUMIN 4.2 07/10/2019 0953   AST 17 07/12/2020 1526   ALT 14 07/12/2020 1526   ALKPHOS 62 07/12/2020 1526   BILITOT 0.2 (L) 07/12/2020 1526   BILITOT <0.2 07/10/2019 0953   GFRNONAA >60 07/12/2020 1526   GFRNONAA >60 12/03/2011 1059   GFRAA 78 07/10/2019 0953   GFRAA >60 12/03/2011 1059       Component Value Date/Time   WBC 7.0 07/12/2020 1526   RBC 4.76 07/12/2020 1526   HGB 14.0 07/12/2020 1526   HGB 13.5 07/10/2019 0953   HCT 42.1 07/12/2020 1526   HCT 41.0 07/10/2019 0953   PLT 342 07/12/2020 1526   PLT 340 07/10/2019 0953   MCV 88.4 07/12/2020 1526   MCV 86 07/10/2019 0953   MCH 29.4 07/12/2020 1526   MCHC 33.3 07/12/2020 1526   RDW 12.8 07/12/2020 1526   RDW 14.0 07/10/2019 0953   LYMPHSABS 2.4 07/12/2020 1526   LYMPHSABS 1.8 12/27/2017 0740   MONOABS 0.6 07/12/2020 1526   EOSABS 0.1 07/12/2020 1526   EOSABS 0.1 12/27/2017 0740   BASOSABS 0.0 07/12/2020 1526   BASOSABS 0.0 12/27/2017 0740    No results found for: POCLITH, LITHIUM   No results found for: PHENYTOIN, PHENOBARB, VALPROATE, CBMZ   .res Assessment: Plan:   Will increase Trileptal to 300 mg po BID for anxiety and mood.  Continue Lamictal 150 mg po BID for mood stabilization.  Continue Xanax 0.25 mg po BID prn anxiety.  Continue Pristiq 150 mg po qd for depression and anxiety.  Continue Latuda 80 mg po qd with evening food for mood stabilization.  Continue Propranolol 20 mg po TID prn anxiety. Continue Hydroxyzine 50 mg po TID prn anxiety. Recommend continuing therapy with [name], LCSW.  Ashley Stephens to follow-up in 2 months or sooner if clinically indicated.  Patient advised to contact office with any questions, adverse effects, or acute worsening in signs and symptoms.  Ashley Stephens was seen today for anxiety, depression and sleeping  problem.  Diagnoses and all orders for this visit:  Generalized anxiety disorder -     Oxcarbazepine (TRILEPTAL) 300 MG tablet; Take 1 tablet (300 mg total) by mouth 2 (two) times daily. -     desvenlafaxine (PRISTIQ) 50 MG 24 hr tablet; Take 1 tablet (50 mg total) by mouth every evening. -     desvenlafaxine (PRISTIQ) 100 MG 24 hr tablet; Take 1 tablet (100 mg total) by mouth every morning. -     propranolol (INDERAL) 20 MG tablet; Take 1 tablet (20 mg total) by mouth 3 (three) times daily as needed.  Primary insomnia -     Oxcarbazepine (TRILEPTAL) 300 MG tablet; Take 1 tablet (300 mg total) by mouth 2 (two) times daily. -     hydrOXYzine (VISTARIL) 50 MG capsule; Take 1 capsule by mouth three times daily as needed  Mood disorder (HCC) -     Oxcarbazepine (TRILEPTAL) 300 MG tablet; Take 1 tablet (300 mg total) by mouth 2 (two) times daily. -     desvenlafaxine (PRISTIQ) 50 MG 24 hr tablet; Take 1 tablet (50 mg total) by mouth every evening. -     desvenlafaxine (PRISTIQ) 100 MG 24 hr tablet; Take 1 tablet (100 mg total) by mouth every morning. -     lurasidone (LATUDA) 80 MG TABS tablet; Take 1 tablet (80 mg total) by mouth daily with breakfast.     Please see After Visit Summary for patient specific instructions.  Future Appointments  Date Time Provider [place]  08/19/2020  8:00 AM [name], LCSW [facility] None  08/22/2020  9:00 AM Wine, Argie Ramming, RN [facility] None  09/15/2020  9:00 AM [name], LCSW [facility] None  09/29/2020  8:00 AM [name], LCSW [facility] None  10/11/2020  8:00 AM [name], LCSW [facility] None  10/12/2020  9:00 AM [name], PMHNP [facility] None  10/25/2020  8:00 AM [name], LCSW [facility] None  11/08/2020  8:00 AM [name], LCSW [facility] None    No orders of the defined types were placed in this encounter.   -------------------------------

## 2020-08-12 NOTE — Progress Notes (Signed)
   08/12/20 0852  Facial and Oral Movements  Muscles of Facial Expression 0  Lips and Perioral Area 0  Jaw 0  Tongue 0  Extremity Movements  Upper (arms, wrists, hands, fingers) 0  Lower (legs, knees, ankles, toes) 0  Trunk Movements  Neck, shoulders, hips 0  Overall Severity  Severity of abnormal movements (highest score from questions above) 0  Incapacitation due to abnormal movements 0  Patient's awareness of abnormal movements (rate only patient's report) 0  AIMS Total Score  AIMS Total Score 0

## 2020-08-19 ENCOUNTER — Ambulatory Visit (INDEPENDENT_AMBULATORY_CARE_PROVIDER_SITE_OTHER): Payer: 59 | Admitting: Psychiatry

## 2020-08-19 ENCOUNTER — Other Ambulatory Visit: Payer: Self-pay

## 2020-08-19 DIAGNOSIS — F411 Generalized anxiety disorder: Secondary | ICD-10-CM | POA: Diagnosis not present

## 2020-08-19 DIAGNOSIS — R69 Illness, unspecified: Secondary | ICD-10-CM | POA: Diagnosis not present

## 2020-08-19 NOTE — Progress Notes (Signed)
[facility] Counselor/Therapist Progress Note  Patient ID: Ashley Stephens, MRN: [id number],    Date: 08/19/2020  Time Spent: 45 minutes     8:05am to 8:50am  Treatment Type: Individual Therapy  Reported Symptoms: anxiety, depression, frustration (work and personal)  Mental Status Exam:  Appearance:   Casual     Behavior:  Appropriate and Sharing  Motor:  Normal  Speech/Language:   Clear and Coherent  Affect:  anxious  Mood:  anxious and depressed  Thought process:  goal directed  Thought content:    WNL  Sensory/Perceptual disturbances:    WNL  Orientation:  oriented to person, place, time/date, situation, day of week, month of year and year  Attention:  Fair  Concentration:  Fair  Memory:  [place] of knowledge:   Good  Insight:    Good  Judgment:   Good and Fair  Impulse Control:  Good and Fair   Risk Assessment: Danger to Self:  No Self-injurious Behavior: No Danger to Others: No Duty to Warn:no Physical Aggression / Violence:No  Access to Firearms a concern: No  Gang Involvement:No   Subjective: Patient today says anxiety is her main symptom along with some depression and frustration.  "I feel like I'm maintaining some better that I was, but still have my symptoms but they're not as strong."   Interventions: Solution-Oriented/Positive Psychology and Ego-Supportive  Diagnosis:   ICD-10-CM   1. Generalized anxiety disorder  F41.1      Plan: Patient not signing tx plan on computer screen due to [place].  Treatment Goals: Goals remain on treatment plan as patient works with strategies to achieve her goals.  Progress is noted each session and documented in "Progress" section of Note.   Long term goal: Develop the ability to recognize, accept, and cope with feelings of depression and anxiety.  Short term goal: Learn and implement personal skills for managing stress, solving daily problems, and resolving conflicts  effectively.  Strategy: Teach patient calming strategies, problem solving skills, and conflict resolution skills to better manage daily stressors.  Progressing- Patient today trying to manage anxiety, frustration, some irritability, and depression "but it's not worse".  "Things aggravate me and boss doesn't understand I have to miss work sometimes because of my problems."  Plans to look for other employment. Holidays can be more difficult for her and we discussed this more at length in session today, along with things she can do to take care of herself and not be as impacted by negativity of others. States she does plan to participate in family holiday gathering at her mother's home and she feels she can manage that ok. Today, presents more anxious versus depressed, and adds that some of her current anxiety is due to having another appt today with an eye doctor, which she says she has been needing for a while, as she's having some vision issues especially on computer. Over all states family issues have not been quite as difficult recently.  Also reports feeling more "healed" from her recent surgery and was moving about today much better without pain.  Encouraged patient to be positive in her self-care including setting and keeping healthy boundaries with others as needed, maintaining contact with people who are supportive in her life, keeping expectations realistic, intentionally looking for more positives each day, taking breaks as she can during the getting outside some each day, and making self talk encouraging and positive.  Goal review and progress/challenges noted with patient.  Next appointment within 2 to 3 weeks.   [name], LCSW

## 2020-08-22 ENCOUNTER — Other Ambulatory Visit: Payer: Self-pay | Admitting: *Deleted

## 2020-08-22 ENCOUNTER — Encounter: Payer: Self-pay | Admitting: *Deleted

## 2020-08-22 NOTE — Patient Outreach (Signed)
[facility]) Care Management  [place]  08/22/2020   Ashley Stephens March 17, 1976 [id number]  Subjective: Successful telephone outreach call to patient. HIPAA identifiers obtained. Patient reports she is doing fairly well. Patient states that her home environment is safe, she has not had any recent falls, and has support if needed from her daughter Ashley Stephens and family. Patient shared that her job is very stressful. She tries to decrease her stress by  relaxing whenever she can during her off time, doing diversional activities mostly entertainment of some sort, and she goes to counseling. Patient states that she takes her B/P and records the values most days. Her B/P ranges are systolic 093-267'T to diastolic 24-58. She does try to maintain a healthy diet and limits her sodium, sugar, and carbohydrate intake. Her last A1c was 6 on 04/05/20. She reports walking 1-2 times during the week during her lunch hour which also helps with her stress. Patient explained that she needed to end our conversation due to having to go to the store before she had to start work at 1100.   Encounter Medications:  Outpatient Encounter Medications as of 08/22/2020  Medication Sig Note  . ALPRAZolam (XANAX) 0.25 MG tablet Take 1 tablet (0.25 mg total) by mouth 2 (two) times daily as needed. for anxiety   . cholecalciferol (VITAMIN D3) 25 MCG (1000 UNIT) tablet Take 1,000 Units by mouth daily.   . Cyanocobalamin (VITAMIN B 12 PO) Take by mouth.   . desvenlafaxine (PRISTIQ) 100 MG 24 hr tablet Take 1 tablet (100 mg total) by mouth every morning.   . desvenlafaxine (PRISTIQ) 50 MG 24 hr tablet Take 1 tablet (50 mg total) by mouth every evening.   . Dulaglutide (TRULICITY) 1.5 KD/9.8PJ SOPN Inject 1.5 mg into the skin once a week.   . hydrOXYzine (VISTARIL) 50 MG capsule Take 1 capsule by mouth three times daily as needed   . lamoTRIgine (LAMICTAL) 150 MG tablet Take 1 tablet (150 mg total) by  mouth 2 (two) times daily.   Marland Kitchen levothyroxine (SYNTHROID) 25 MCG tablet Take 1 tablet (25 mcg total) by mouth daily before breakfast.   . lurasidone (LATUDA) 80 MG TABS tablet Take 1 tablet (80 mg total) by mouth daily with breakfast.   . Oxcarbazepine (TRILEPTAL) 300 MG tablet Take 1 tablet (300 mg total) by mouth 2 (two) times daily.   . propranolol (INDERAL) 20 MG tablet Take 1 tablet (20 mg total) by mouth 3 (three) times daily as needed.   . cephALEXin (KEFLEX) 500 MG capsule Take 1 capsule (500 mg total) by mouth 3 (three) times daily. (Patient not taking: No sig reported) 08/22/2020: completed  . oxyCODONE-acetaminophen (PERCOCET/ROXICET) 5-325 MG tablet Take 1 tablet by mouth every 6 (six) hours as needed for severe pain. (Patient not taking: Reported on 08/22/2020) 08/22/2020: completed   No facility-administered encounter medications on file as of 08/22/2020.    Functional Status:  No flowsheet data found.  Fall/Depression Screening: Fall Risk  05/24/2020 04/05/2020 12/03/2019  Falls in the past year? 0 0 0   PHQ 2/9 Scores 08/22/2020 05/24/2020 04/05/2020 12/03/2019 06/26/2019 10/03/2018 06/20/2018  PHQ - 2 Score 2 0 0 0 0 0 2  PHQ- 9 Score 4 - - - - - 15  Some encounter information is confidential and restricted. Go to Review Flowsheets activity to see all data.    Assessment:  Goals Addressed            This Visit's Progress   .  Track and Manage My Blood Pressure-Hypertension   On track    Timeframe:  Long-Range Goal Priority:  High Start Date: 08/22/20                           Expected End Date: 03/02/21                      Follow Up Date 11/30/20    - check blood pressure daily - choose a place to take my blood pressure (home, clinic or office, retail store) - write blood pressure results in a log or diary    Why is this important?    You won't feel high blood pressure, but it can still hurt your blood vessels.   High blood pressure can cause heart or kidney  problems. It can also cause a stroke.   Making lifestyle changes like losing a little weight or eating less salt will help.   Checking your blood pressure at home and at different times of the day can help to control blood pressure.   If the doctor prescribes medicine remember to take it the way the doctor ordered.   Call the office if you cannot afford the medicine or if there are questions about it.     Notes: Patient states that she takes her B/P and records the values most days. She does try to maintain a healthy diet and limits her sodium, sugar, and carbohydrate intake. She reports walking 1-2 times during the week during her lunch hour which also helps with her stress. Patient reports having a very stressful job. She manages her stress by relaxing whenever possible, enjoying entertainment activities, and goes to counseling.       Plan: RN Health Coach will send PCP a barrier letter and today's assessment note and will call the patient within the month of March Follow-up:  Patient agrees to Care Plan and Follow-up.   Ashley Loron RN, BSN [place] [phone] [email or web address]

## 2020-08-22 NOTE — Patient Instructions (Signed)
Goals Addressed            This Visit's Progress   . Track and Manage My Blood Pressure-Hypertension   On track    Timeframe:  Long-Range Goal Priority:  High Start Date: 08/22/20                           Expected End Date: 03/02/21                      Follow Up Date 11/30/20    - check blood pressure daily - choose a place to take my blood pressure (home, clinic or office, retail store) - write blood pressure results in a log or diary    Why is this important?    You won't feel high blood pressure, but it can still hurt your blood vessels.   High blood pressure can cause heart or kidney problems. It can also cause a stroke.   Making lifestyle changes like losing a little weight or eating less salt will help.   Checking your blood pressure at home and at different times of the day can help to control blood pressure.   If the doctor prescribes medicine remember to take it the way the doctor ordered.   Call the office if you cannot afford the medicine or if there are questions about it.     Notes: Patient states that she takes her B/P and records the values most days. She does try to maintain a healthy diet and limits her sodium, sugar, and carbohydrate intake. She reports walking 1-2 times during the week during her lunch hour which also helps with her stress. Patient reports having a very stressful job. She manages her stress by relaxing whenever possible, enjoying entertainment activities, and goes to counseling.

## 2020-09-13 DIAGNOSIS — Z20828 Contact with and (suspected) exposure to other viral communicable diseases: Secondary | ICD-10-CM | POA: Diagnosis not present

## 2020-09-13 DIAGNOSIS — Z03818 Encounter for observation for suspected exposure to other biological agents ruled out: Secondary | ICD-10-CM | POA: Diagnosis not present

## 2020-09-14 ENCOUNTER — Encounter: Payer: 59 | Admitting: Hospice and Palliative Medicine

## 2020-09-15 ENCOUNTER — Ambulatory Visit (INDEPENDENT_AMBULATORY_CARE_PROVIDER_SITE_OTHER): Payer: 59 | Admitting: Psychiatry

## 2020-09-15 ENCOUNTER — Other Ambulatory Visit: Payer: Self-pay

## 2020-09-15 DIAGNOSIS — R69 Illness, unspecified: Secondary | ICD-10-CM | POA: Diagnosis not present

## 2020-09-15 DIAGNOSIS — F411 Generalized anxiety disorder: Secondary | ICD-10-CM | POA: Diagnosis not present

## 2020-09-15 NOTE — Progress Notes (Signed)
[facility] Counselor/Therapist Progress Note  Patient ID: Ashley Stephens, MRN: [id number],    Date: 09/15/2020  Time Spent: 50 minutes   8:57am to 9:47am   Treatment Type: Individual Therapy  Reported Symptoms: anxiety, depression but "more so it's anxiety"  Mental Status Exam:  Appearance:   Casual     Behavior:  Appropriate and Sharing  Motor:  Normal  Speech/Language:   Clear and Coherent  Affect:  anxious  Mood:  anxious and depressed  Thought process:  goal directed  Thought content:    WNL  Sensory/Perceptual disturbances:    WNL  Orientation:  oriented to person, place, time/date, situation, day of week, month of year and year  Attention:  Fair  Concentration:  Fair  Memory:  [place] of knowledge:   Good  Insight:    Fair  Judgment:   Fair  Impulse Control:  Good   Risk Assessment: Danger to Self:  No Self-injurious Behavior: No Danger to Others: No Duty to Warn:no Physical Aggression / Violence:No  Access to Firearms a concern: No  Gang Involvement:No   Subjective: Patient today reporting anxiety and some depression "but anxiety is main symptom."   Interventions: Cognitive Behavioral Therapy and Solution-Oriented/Positive Psychology  Diagnosis:   ICD-10-CM   1. Generalized anxiety disorder  F41.1     Plan: Patient not signing tx plan on computer screen due to [place].  Treatment Goals: Goals remain on treatment plan as patient works with strategies to achieve her goals.  Progress is noted each session and documented in "Progress" section of Note.   Long term goal: Develop the ability to recognize, accept, and cope with feelings of depression and anxiety.  Short term goal: Learn and implement personal skills for managing stress, solving daily problems, and resolving conflicts effectively.  Strategy: Teach patient calming strategies, problem solving skills, and conflict resolution skills to better manage daily  stressors.  Progressing- Patient in today reporting anxiety as main symptom, along with some depression. States that her sleep is good, some nights better than others but" pretty good." "I don't have many places to talk about me and my situations.  Holidays this year were better than prior holidays, "because I'm learning to set limits on my time with others especially with others or in situations that have proven to be unhelpful and upsetting.  Work remains problematic and stressful, and she states she is looking for other job but in meantime is trying to get a better schedule at work. Feels she really needs to be on a Mon.-Fri schedule working more regular hours versus starting late and working til 8:00pm. Does seem today to be better managing anxiety, frustration, and challenges by her setting some limits, more positive self-talk "sometimes, and "learning to take thinkgs 1 step at the time and I can't worry about the past so I focus on today."  "It's hard but I'm trying." Has set firm limits with adult son and not really having contact with him much, "I can't get back on that roller coaster ride."  Smiles more today and laughs appropriately as she talks.  Working to have better limits with adult kids so they can't manipulate her as much. Physically, reports she is healed from recent surgeries. States she feels good physically and is taking better care of herself.  Encouraged patient to keep up her current level of setting healthier boundaries and in taking care of herself.  Has reduced her negative self talk and encouraged to  practice more positive and affirming self talk, getting outside every day, intentionally looking for more positives daily, taking breaks as she can during the day, keeping her expectations realistic, and staying in the present which she admitted today has been helpful to her.  Goal review and progress/challenges noted with patient.  Next appointment in 2 to 3 weeks.   [name], LCSW

## 2020-09-15 NOTE — Progress Notes (Deleted)
      [facility] Counselor/Therapist Progress Note  Patient ID: Ashley Stephens, MRN: [id number],    Date: 09/15/2020  Time Spent: ***   Treatment Type: {[facility] AMB THERAPY [phone]}  Reported Symptoms: ***  Mental Status Exam:  Appearance:   {PSY:22683}     Behavior:  {PSY:21022743}  Motor:  {PSY:22302}  Speech/Language:   {PSY:22685}  Affect:  {PSY:22687}  Mood:  {PSY:31886}  Thought process:  {PSY:31888}  Thought content:    {[phone]}  Sensory/Perceptual disturbances:    {PSY:[phone]}  Orientation:  {PSY:30297}  Attention:  {PSY:22877}  Concentration:  {PSY:204-708-8856}  Memory:  {PSY:360-387-8015}  Fund of knowledge:   {PSY:204-708-8856}  Insight:    {PSY:204-708-8856}  Judgment:   {PSY:204-708-8856}  Impulse Control:  {PSY:204-708-8856}   Risk Assessment: Danger to Self:  {PSY:22692} Self-injurious Behavior: {PSY:22692} Danger to Others: {PSY:22692} Duty to Warn:{PSY:311194} Physical Aggression / Violence:{PSY:21197} Access to Firearms a concern: {PSY:21197} Gang Involvement:{PSY:21197}  Subjective: ***   Interventions: {PSY:[phone]}  Diagnosis:   ICD-10-CM   1. Generalized anxiety disorder  F41.1     Plan: ***  [name], LCSW

## 2020-09-15 NOTE — Progress Notes (Deleted)
      [facility] Counselor/Therapist Progress Note  Patient ID: Ashley Stephens, MRN: [id number],    Date: 09/15/2020  Time Spent: 60 minutes   9:00am to 10:00am  Treatment Type: Individual Therapy  Reported Symptoms: anxiety     Mental Status Exam:  Appearance:   {PSY:22683}     Behavior:  {PSY:21022743}  Motor:  {PSY:22302}  Speech/Language:   {PSY:22685}  Affect:  {PSY:22687}  Mood:  {PSY:31886}  Thought process:  {PSY:31888}  Thought content:    {PSY:726-078-8103}  Sensory/Perceptual disturbances:    {PSY:6307432560}  Orientation:  {PSY:30297}  Attention:  {PSY:22877}  Concentration:  {PSY:773-096-3661}  Memory:  {[phone]}  Fund of knowledge:   {PSY:773-096-3661}  Insight:    {PSY:773-096-3661}  Judgment:   {PSY:773-096-3661}  Impulse Control:  {PSY:773-096-3661}   Risk Assessment: Danger to Self:  {PSY:22692} Self-injurious Behavior: {PSY:22692} Danger to Others: {PSY:22692} Duty to Warn:{PSY:311194} Physical Aggression / Violence:{PSY:21197} Access to Firearms a concern: {PSY:21197} Gang Involvement:{PSY:21197}  Subjective: Patient today reporting anxiety     Interventions: Solution-Oriented/Positive Psychology and Ego-Supportive  Diagnosis:   ICD-10-CM   1. Generalized anxiety disorder  F41.1      Plan: Patient not signing tx plan on computer screen due to [place].  Treatment Goals: Goals remain on treatment plan as patient works with strategies to achieve her goals.  Progress is noted each session and documented in "Progress" section of Note.   Long term goal: Develop the ability to recognize, accept, and cope with feelings of depression and anxiety.  Short term goal: Learn and implement personal skills for managing stress, solving daily problems, and resolving conflicts effectively.  Strategy: Teach patient calming strategies, problem solving skills, and conflict resolution skills to better manage daily  stressors.  Progressing- Patient in today    Patient today trying to manage anxiety, frustration, some irritability, and depression "but it's not worse".  "Things aggravate me and boss doesn't understand I have to miss work sometimes because of my problems."  Plans to look for other employment. Holidays can be more difficult for her and we discussed this more at length in session today, along with things she can do to take care of herself and not be as impacted by negativity of others. States she does plan to participate in family holiday gathering at her mother's home and she feels she can manage that ok. Today, presents more anxious versus depressed, and adds that some of her current anxiety is due to having another appt today with an eye doctor, which she says she has been needing for a while, as she's having some vision issues especially on computer. Over all states family issues have not been quite as difficult recently.  Also reports feeling more "healed" from her recent surgery and was moving about today much better without pain.  Encouraged patient to be positive in her self-care including setting and keeping healthy boundaries with others as needed, maintaining contact with people who are supportive in her life, keeping expectations realistic, intentionally looking for more positives each day, taking breaks as she can during the getting outside some each day, and making self talk encouraging and positive.    Goal review and progress/challenges noted with patient.  Return within 2 to 3 weeks.   [name], LCSW

## 2020-09-22 ENCOUNTER — Telehealth: Payer: Self-pay | Admitting: Psychiatry

## 2020-09-22 NOTE — Telephone Encounter (Signed)
Received fax from [employer] regarding Ashley Stephens for [facility] for completion. Placed on Traci's desk.

## 2020-09-26 ENCOUNTER — Telehealth: Payer: Self-pay | Admitting: Psychiatry

## 2020-09-26 NOTE — Telephone Encounter (Signed)
Spoke with patient and she reports the FMLA is for the 6 month renewal, she apologized for the forms needing completed once again. No extra time or changes needed or requested.

## 2020-09-29 ENCOUNTER — Ambulatory Visit (INDEPENDENT_AMBULATORY_CARE_PROVIDER_SITE_OTHER): Payer: 59 | Admitting: Psychiatry

## 2020-09-29 ENCOUNTER — Other Ambulatory Visit: Payer: Self-pay

## 2020-09-29 DIAGNOSIS — R69 Illness, unspecified: Secondary | ICD-10-CM | POA: Diagnosis not present

## 2020-09-29 DIAGNOSIS — F411 Generalized anxiety disorder: Secondary | ICD-10-CM

## 2020-09-29 NOTE — Progress Notes (Signed)
[facility] Counselor/Therapist Progress Note  Patient ID: Ashley Stephens, MRN: [id number],    Date: 09/29/2020  Time Spent: 60 minutes   8:00am to 9:00am   Treatment Type: Individual Therapy  Reported Symptoms: anxiety, depression, anger with depression and anxiety reported as main symptoms   Mental Status Exam:  Appearance:   Casual     Behavior:  Appropriate, Sharing and Motivated  Motor:  Normal  Speech/Language:   Clear and Coherent  Affect:  Depressed and anxious  Mood:  angry, anxious and depressed  Thought process:  goal directed  Thought content:    some ruminating  Sensory/Perceptual disturbances:    WNL  Orientation:  oriented to person, place, time/date, situation, day of week, month of year and year  Attention:  Good  Concentration:  Good  Memory:  WNL  Fund of knowledge:   Good  Insight:    Good  Judgment:   Good  Impulse Control:  Good   Risk Assessment: Danger to Self:  No Self-injurious Behavior: No Danger to Others: No Duty to Warn:no Physical Aggression / Violence:No  Access to Firearms a concern: No  Gang Involvement:No   Subjective: Patient today reporting anxiety, depression, and anger.  Interventions: Cognitive Behavioral Therapy and Solution-Oriented/Positive Psychology  Diagnosis:   ICD-10-CM   1. Generalized anxiety disorder  F41.1      Plan: Patient not signing tx plan on computer screen due to [place].  Treatment Goals: Goals remain on treatment plan as patient works with strategies to achieve her goals. Progress is noted each session and documented in "Progress" section of Note.   Long term goal: Develop the ability to recognize, accept, and cope with feelings of depression and anxiety.  Short term goal: Learn and implement personal skills for managing stress, solving daily problems, and resolving conflicts effectively.  Strategy: Teach patient calming strategies, problem solving skills, and conflict  resolution skills to better manage daily stressors.  Progressing- Patient in today reporting anxiety, depression, and anger.  States anger is at herself, work people, and family. "Mad at myself to get in the shape I'm in now, wish I had of learned sooner how to cope and survive.  Processed these thoughts in more detail with patient. Able to look into the present leaning towards future re: how she would like things to be and changes she wants to work towards for her own mental health and overall well being. Focused a lot today on taking care of herself which she states is her main concern.  Encouraged patient in realizing her self-care needs, physically and emotionally. Sleep overall she says is pretty good. Is continuing to set healthier limits at times, need to continue working on this. Realizing "I don't have to be with people all the time." Work remains very stressful and patient is hoping she can get a M-F position at some point. Maintaining the boundaries she has recently set with adult son. Improvement in managing anxiety, frustration, anger, and other challenges and continues to work on these. More engaging in sessions and doesn't get stuck in anger so easily.  Patient agrees with this progress but healing and changes are not easy as her life has been very difficult for a long time. Gave examples of more positive and empowering self-talk and patient is to continue between sessions.  Also encouraged to focus on each day at the time, take even small breaks as she is able, set and keep boundaries with others, to focus on what  she can control versus cannot, getting outside some each day, intentionally looking for more positives and emphasizing the positives daily, and staying in contact with people who are supportive of her.  Goal review and progress/challenges noted with patient.  Next appointment within 2 to 3 weeks.   [name], LCSW

## 2020-09-29 NOTE — Telephone Encounter (Signed)
Error

## 2020-10-03 ENCOUNTER — Encounter: Payer: Self-pay | Admitting: Physician Assistant

## 2020-10-03 ENCOUNTER — Ambulatory Visit (INDEPENDENT_AMBULATORY_CARE_PROVIDER_SITE_OTHER): Payer: 59 | Admitting: Physician Assistant

## 2020-10-03 VITALS — BP 120/77 | HR 70 | Temp 97.8°F | Resp 16 | Ht 67.5 in | Wt 256.0 lb

## 2020-10-03 DIAGNOSIS — E039 Hypothyroidism, unspecified: Secondary | ICD-10-CM | POA: Diagnosis not present

## 2020-10-03 DIAGNOSIS — Z1231 Encounter for screening mammogram for malignant neoplasm of breast: Secondary | ICD-10-CM | POA: Diagnosis not present

## 2020-10-03 DIAGNOSIS — Z1211 Encounter for screening for malignant neoplasm of colon: Secondary | ICD-10-CM

## 2020-10-03 DIAGNOSIS — K219 Gastro-esophageal reflux disease without esophagitis: Secondary | ICD-10-CM

## 2020-10-03 DIAGNOSIS — E1165 Type 2 diabetes mellitus with hyperglycemia: Secondary | ICD-10-CM | POA: Diagnosis not present

## 2020-10-03 DIAGNOSIS — F411 Generalized anxiety disorder: Secondary | ICD-10-CM | POA: Diagnosis not present

## 2020-10-03 DIAGNOSIS — Z113 Encounter for screening for infections with a predominantly sexual mode of transmission: Secondary | ICD-10-CM

## 2020-10-03 DIAGNOSIS — Z124 Encounter for screening for malignant neoplasm of cervix: Secondary | ICD-10-CM

## 2020-10-03 DIAGNOSIS — R300 Dysuria: Secondary | ICD-10-CM | POA: Diagnosis not present

## 2020-10-03 DIAGNOSIS — Z0001 Encounter for general adult medical examination with abnormal findings: Secondary | ICD-10-CM | POA: Diagnosis not present

## 2020-10-03 DIAGNOSIS — R69 Illness, unspecified: Secondary | ICD-10-CM | POA: Diagnosis not present

## 2020-10-03 LAB — POCT UA - MICROALBUMIN
Albumin/Creatinine Ratio, Urine, POC: <30
Creatinine, POC: 300 mg/dL
Microalbumin Ur, POC: 30 mg/L

## 2020-10-03 LAB — POCT GLYCOSYLATED HEMOGLOBIN (HGB A1C): Hemoglobin A1C: 5.8 % — AB (ref 4.0–5.6)

## 2020-10-03 MED ORDER — OMEPRAZOLE 40 MG PO CPDR: 40.0000 mg | DELAYED_RELEASE_CAPSULE | Freq: Every day | ORAL | 3 refills | Status: DC

## 2020-10-03 MED ORDER — LEVOTHYROXINE SODIUM 25 MCG PO TABS: 25.0000 ug | ORAL_TABLET | Freq: Every day | ORAL | 2 refills | Status: DC

## 2020-10-03 NOTE — Progress Notes (Signed)
[facility] [place], [place] [address]  Internal MEDICINE  Office Visit Note  Patient Name: Ashley Stephens  [id number]  [id number]  Date of Service: 10/03/2020  Chief Complaint  Patient presents with  . Annual Exam  . Anxiety  . Depression  . Gastroesophageal Reflux  . Hypertension  . Quality Metric Gaps    Eye exam and colonoscopy     HPI Pt is here for routine health maintenance examination. Pt is due for Mammogram, Colonoscopy, and pap. She will also go for an eye exam. She feels her anxiety is well controlled at this time and she is taking 1 xanax at night as needed. She is followed by behavioral health. She loves being on the Trulicity and thinks it is going well. BG at home have ranged 100-300 depending on what she has eaten, and she is not checking consistently. She reports her GERD was bad in January and she needs a new prescription of Prilosec. BP was great today in office. She does not check it at home.   Current Medication: Outpatient Encounter Medications as of 10/03/2020  Medication Sig Note  . ALPRAZolam (XANAX) 0.25 MG tablet Take 1 tablet (0.25 mg total) by mouth 2 (two) times daily as needed. for anxiety   . cholecalciferol (VITAMIN D3) 25 MCG (1000 UNIT) tablet Take 1,000 Units by mouth daily.   . Cyanocobalamin (VITAMIN B 12 PO) Take by mouth.   . desvenlafaxine (PRISTIQ) 100 MG 24 hr tablet Take 1 tablet (100 mg total) by mouth every morning.   . desvenlafaxine (PRISTIQ) 50 MG 24 hr tablet Take 1 tablet (50 mg total) by mouth every evening.   . Dulaglutide (TRULICITY) 1.5 0000000 SOPN Inject 1.5 mg into the skin once a week.   . hydrOXYzine (VISTARIL) 50 MG capsule Take 1 capsule by mouth three times daily as needed   . lamoTRIgine (LAMICTAL) 150 MG tablet Take 1 tablet (150 mg total) by mouth 2 (two) times daily.   Marland Kitchen lurasidone (LATUDA) 80 MG TABS tablet Take 1 tablet (80 mg total) by mouth daily with breakfast.   .  omeprazole (PRILOSEC) 40 MG capsule Take 1 capsule (40 mg total) by mouth daily.   . Oxcarbazepine (TRILEPTAL) 300 MG tablet Take 1 tablet (300 mg total) by mouth 2 (two) times daily.   Marland Kitchen oxyCODONE-acetaminophen (PERCOCET/ROXICET) 5-325 MG tablet Take 1 tablet by mouth every 6 (six) hours as needed for severe pain. 08/22/2020: completed  . propranolol (INDERAL) 20 MG tablet Take 1 tablet (20 mg total) by mouth 3 (three) times daily as needed.   . [DISCONTINUED] cephALEXin (KEFLEX) 500 MG capsule Take 1 capsule (500 mg total) by mouth 3 (three) times daily. 08/22/2020: completed  . [DISCONTINUED] levothyroxine (SYNTHROID) 25 MCG tablet Take 1 tablet (25 mcg total) by mouth daily before breakfast.   . levothyroxine (SYNTHROID) 25 MCG tablet Take 1 tablet (25 mcg total) by mouth daily before breakfast.    No facility-administered encounter medications on file as of 10/03/2020.    Surgical History: Past Surgical History:  Procedure Laterality Date  . ABDOMINAL HYSTERECTOMY  2013  . BARIATRIC SURGERY    . BREAST BIOPSY  1995  . GASTRIC BYPASS  2012  . TUBAL LIGATION  1996  . tummy tuck  05/27/2020    Medical History: Past Medical History:  Diagnosis Date  . Anxiety   . Depression   . Gastric ulcer   . GERD (gastroesophageal reflux disease)   . Hypertension   .  Osteoarthritis   . Thyroid disorder     Family History: Family History  Problem Relation Age of Onset  . Breast cancer Maternal Grandmother       Review of Systems  Constitutional: Negative for chills, fatigue and unexpected weight change.  HENT: Negative for congestion, postnasal drip, rhinorrhea, sneezing and sore throat.   Eyes: Negative for redness.  Respiratory: Negative for cough, chest tightness and shortness of breath.   Cardiovascular: Negative for chest pain and palpitations.  Gastrointestinal: Negative for abdominal pain, constipation, diarrhea, nausea and vomiting.  Genitourinary: Negative for dysuria and  frequency.  Musculoskeletal: Negative for arthralgias, back pain, joint swelling and neck pain.  Skin: Negative for rash.  Neurological: Negative.  Negative for tremors and numbness.  Hematological: Negative for adenopathy. Does not bruise/bleed easily.  Psychiatric/Behavioral: Negative for behavioral problems (Depression), sleep disturbance and suicidal ideas. The patient is not nervous/anxious.      Vital Signs: BP 120/77   Pulse 70   Temp 97.8 F (36.6 C)   Resp 16   Ht 5' 7.5" (1.715 m)   Wt 256 lb (116.1 kg)   BMI 39.50 kg/m    Physical Exam Constitutional:      General: She is not in acute distress.    Appearance: She is well-developed. She is obese. She is not diaphoretic.  HENT:     Head: Normocephalic and atraumatic.     Right Ear: External ear normal.     Left Ear: External ear normal.     Nose: Nose normal.     Mouth/Throat:     Pharynx: No oropharyngeal exudate.  Eyes:     General: No scleral icterus.       Right eye: No discharge.        Left eye: No discharge.     Conjunctiva/sclera: Conjunctivae normal.     Pupils: Pupils are equal, round, and reactive to light.  Neck:     Thyroid: No thyromegaly.     Vascular: No JVD.     Trachea: No tracheal deviation.  Cardiovascular:     Rate and Rhythm: Normal rate and regular rhythm.     Pulses:          Dorsalis pedis pulses are 3+ on the right side and 3+ on the left side.       Posterior tibial pulses are 3+ on the right side and 3+ on the left side.     Heart sounds: Normal heart sounds. No murmur heard. No friction rub. No gallop.   Pulmonary:     Effort: Pulmonary effort is normal. No respiratory distress.     Breath sounds: Normal breath sounds. No stridor. No wheezing or rales.  Chest:     Chest wall: No tenderness.  Abdominal:     General: Bowel sounds are normal. There is no distension.     Palpations: Abdomen is soft. There is no mass.     Tenderness: There is no abdominal tenderness. There is  no guarding or rebound.  Genitourinary:    General: Normal vulva.     Exam position: Lithotomy position.     Pubic Area: No rash.      Labia:        Right: No rash.        Left: No rash.   Musculoskeletal:        General: No tenderness or deformity. Normal range of motion.     Cervical back: Normal range of motion and neck supple.  Right foot: Normal range of motion.     Left foot: Normal range of motion.  Feet:     Right foot:     Protective Sensation: 2 sites tested. 2 sites sensed.     Skin integrity: Skin integrity normal.     Toenail Condition: Right toenails are normal.     Left foot:     Protective Sensation: 2 sites tested. 2 sites sensed.     Skin integrity: Skin integrity normal.     Toenail Condition: Left toenails are normal.  Lymphadenopathy:     Cervical: No cervical adenopathy.  Skin:    General: Skin is warm and dry.     Coloration: Skin is not pale.     Findings: No erythema or rash.  Neurological:     General: No focal deficit present.     Mental Status: She is alert.     Cranial Nerves: No cranial nerve deficit.     Motor: No abnormal muscle tone.     Coordination: Coordination normal.     Deep Tendon Reflexes: Reflexes are normal and symmetric.  Psychiatric:        Mood and Affect: Mood normal.        Behavior: Behavior normal.        Thought Content: Thought content normal.        Judgment: Judgment normal.      LABS: Recent Results (from the past 2160 hour(s))  Urinalysis, Routine w reflex microscopic Urine, Clean Catch     Status: Abnormal   Collection Time: 07/12/20  3:26 PM  Result Value Ref Range   Color, Urine YELLOW YELLOW   APPearance HAZY (A) CLEAR   Specific Gravity, Urine 1.020 1.005 - 1.030   pH 6.5 5.0 - 8.0   Glucose, UA NEGATIVE NEGATIVE mg/dL   Hgb urine dipstick NEGATIVE NEGATIVE   Bilirubin Urine NEGATIVE NEGATIVE   Ketones, ur NEGATIVE NEGATIVE mg/dL   Protein, ur NEGATIVE NEGATIVE mg/dL   Nitrite NEGATIVE NEGATIVE    Leukocytes,Ua NEGATIVE NEGATIVE    Comment: Microscopic not done on urines with negative protein, blood, leukocytes, nitrite, or glucose < 500 mg/dL. Performed at [facility], [address] [place], [place] [address]   CBC with Differential     Status: None   Collection Time: 07/12/20  3:26 PM  Result Value Ref Range   WBC 7.0 4.0 - 10.5 K/uL   RBC 4.76 3.87 - 5.11 MIL/uL   Hemoglobin 14.0 12.0 - 15.0 g/dL   HCT 42.1 36.0 - 46.0 %   MCV 88.4 80.0 - 100.0 fL   MCH 29.4 26.0 - 34.0 pg   MCHC 33.3 30.0 - 36.0 g/dL   RDW 12.8 11.5 - 15.5 %   Platelets 342 150 - 400 K/uL   nRBC 0.0 0.0 - 0.2 %   Neutrophils Relative % 55 %   Neutro Abs 3.9 1.7 - 7.7 K/uL   Lymphocytes Relative 34 %   Lymphs Abs 2.4 0.7 - 4.0 K/uL   Monocytes Relative 9 %   Monocytes Absolute 0.6 0.1 - 1.0 K/uL   Eosinophils Relative 2 %   Eosinophils Absolute 0.1 0.0 - 0.5 K/uL   Basophils Relative 0 %   Basophils Absolute 0.0 0.0 - 0.1 K/uL   Immature Granulocytes 0 %   Abs Immature Granulocytes 0.02 0.00 - 0.07 K/uL    Comment: Performed at [facility], [address] [place], [place] [address]  Comprehensive metabolic  panel     Status: Abnormal   Collection Time: 07/12/20  3:26 PM  Result Value Ref Range   Sodium 138 135 - 145 mmol/L   Potassium 4.1 3.5 - 5.1 mmol/L   Chloride 103 98 - 111 mmol/L   CO2 25 22 - 32 mmol/L   Glucose, Bld 92 70 - 99 mg/dL    Comment: Glucose reference range applies only to samples taken after fasting for at least 8 hours.   BUN 7 6 - 20 mg/dL   Creatinine, Ser 0.97 0.44 - 1.00 mg/dL   Calcium 8.9 8.9 - 10.3 mg/dL   Total Protein 7.1 6.5 - 8.1 g/dL   Albumin 4.2 3.5 - 5.0 g/dL   AST 17 15 - 41 U/L   ALT 14 0 - 44 U/L   Alkaline Phosphatase 62 38 - 126 U/L   Total Bilirubin 0.2 (L) 0.3 - 1.2 mg/dL   GFR, Estimated >60 >60 mL/min    Comment: (NOTE) Calculated using the CKD-EPI Creatinine Equation (2021)    Anion gap 10 5 - 15    Comment:  Performed at [facility], [address] [place], [place] [address]  Lipase, blood     Status: None   Collection Time: 07/12/20  3:26 PM  Result Value Ref Range   Lipase 51 11 - 51 U/L    Comment: Performed at [facility], [address] [place], [place] [address]  POCT UA - Microalbumin     Status: Normal   Collection Time: 10/03/20  8:44 AM  Result Value Ref Range   Microalbumin Ur, POC 30 mg/L   Creatinine, POC 300 mg/dL   Albumin/Creatinine Ratio, Urine, POC <30   POCT HgB A1C     Status: Abnormal   Collection Time: 10/03/20  8:49 AM  Result Value Ref Range   Hemoglobin A1C 5.8 (A) 4.0 - 5.6 %   HbA1c POC (<> result, manual entry)     HbA1c, POC (prediabetic range)     HbA1c, POC (controlled diabetic range)      Assessment/Plan: 1. Encounter for general adult medical examination with abnormal findings Age appropriate labs will be ordered. - CBC with Differential/Platelet; Future - Lipid Panel With LDL/HDL Ratio; Future - TSH; Future - T4, free; Future - Comprehensive metabolic panel - VITAMIN D 25 Hydroxy (Vit-D Deficiency, Fractures); Future - B12 and Folate Panel  2. Type 2 diabetes mellitus with hyperglycemia, without long-term current use of insulin (HCC) - POCT HgB A1C 5.8 today. Continue Trulicity. - POCT UA - Microalbumin  3. Morbid obesity ([place]) Continue Trulicity for blood sugar control and weight loss. Discussed the importance of weight management through healthy eating and daily exercise as tolerated. Discussed the negative effects obesity has on pulmonary health, cardiac health as well as overall general health and well being.  4. Generalized anxiety disorder She is followed by behavioral health and is doing well on current medications.  5. Gastroesophageal reflux disease without esophagitis She ran out of her prescription and tried to use OTC which did not help her symptoms. Will restart Prilosec. - omeprazole (PRILOSEC) 40 MG  capsule; Take 1 capsule (40 mg total) by mouth daily.  Dispense: 30 capsule; Refill: 3  6. Acquired hypothyroidism Continue Synthroid. Will recheck T4, TSH - levothyroxine (SYNTHROID) 25 MCG tablet; Take 1 tablet (25 mcg total) by mouth daily before breakfast.  Dispense: 90 tablet; Refill: 2  7. Encounter for screening mammogram for malignant neoplasm of  breast - MM DIGITAL SCREENING BILATERAL; Future  8. Encounter for screening colonoscopy - Ambulatory referral to Gastroenterology  9. Screening examination for sexually transmitted disease - NuSwab Vaginitis Plus (VG+)  10. Routine cervical smear - IGP, Aptima HPV  11. Dysuria - UA/M w/rflx Culture, Routine  General Counseling: Emanuela verbalizes understanding of the findings of todays visit and agrees with plan of treatment. I have discussed any further diagnostic evaluation that may be needed or ordered today. We also reviewed her medications today. she has been encouraged to call the office with any questions or concerns that should arise related to todays visit.    Counseling:    Orders Placed This Encounter  Procedures  . MM DIGITAL SCREENING BILATERAL  . UA/M w/rflx Culture, Routine  . CBC with Differential/Platelet  . Lipid Panel With LDL/HDL Ratio  . TSH  . T4, free  . Comprehensive metabolic panel  . VITAMIN D 25 Hydroxy (Vit-D Deficiency, Fractures)  . B12 and Folate Panel  . NuSwab Vaginitis Plus (VG+)  . Ambulatory referral to Gastroenterology  . POCT HgB A1C  . POCT UA - Microalbumin    Meds ordered this encounter  Medications  . levothyroxine (SYNTHROID) 25 MCG tablet    Sig: Take 1 tablet (25 mcg total) by mouth daily before breakfast.    Dispense:  90 tablet    Refill:  2  . omeprazole (PRILOSEC) 40 MG capsule    Sig: Take 1 capsule (40 mg total) by mouth daily.    Dispense:  30 capsule    Refill:  3    Total time spent: 40 Minutes  Time spent includes review of chart, medications, test  results, and follow up plan with the patient.     Lavera Guise, MD  Internal Medicine

## 2020-10-04 LAB — MICROSCOPIC EXAMINATION
Bacteria, UA: NONE SEEN
Casts: NONE SEEN /lpf
WBC, UA: NONE SEEN /hpf (ref 0–5)

## 2020-10-04 LAB — UA/M W/RFLX CULTURE, ROUTINE
Bilirubin, UA: NEGATIVE
Glucose, UA: NEGATIVE
Leukocytes,UA: NEGATIVE
Nitrite, UA: NEGATIVE
Protein,UA: NEGATIVE
RBC, UA: NEGATIVE
Specific Gravity, UA: 1.027 (ref 1.005–1.030)
Urobilinogen, Ur: 1 mg/dL (ref 0.2–1.0)
pH, UA: 5 (ref 5.0–7.5)

## 2020-10-05 LAB — NUSWAB VAGINITIS PLUS (VG+)
Candida albicans, NAA: NEGATIVE
Candida glabrata, NAA: NEGATIVE
Chlamydia trachomatis, NAA: NEGATIVE
Neisseria gonorrhoeae, NAA: NEGATIVE
Trich vag by NAA: NEGATIVE

## 2020-10-06 DIAGNOSIS — Z0289 Encounter for other administrative examinations: Secondary | ICD-10-CM

## 2020-10-06 LAB — IGP, APTIMA HPV: HPV Aptima: NEGATIVE

## 2020-10-06 NOTE — Telephone Encounter (Signed)
Form completed and given to Jessica to review and sign 

## 2020-10-07 ENCOUNTER — Telehealth (INDEPENDENT_AMBULATORY_CARE_PROVIDER_SITE_OTHER): Payer: Self-pay | Admitting: Gastroenterology

## 2020-10-07 ENCOUNTER — Telehealth: Payer: Self-pay | Admitting: Psychiatry

## 2020-10-07 DIAGNOSIS — Z1211 Encounter for screening for malignant neoplasm of colon: Secondary | ICD-10-CM

## 2020-10-07 MED ORDER — NA SULFATE-K SULFATE-MG SULF 17.5-3.13-1.6 GM/177ML PO SOLN: 1.0000 | Freq: Once | ORAL | 0 refills | Status: AC

## 2020-10-07 NOTE — Progress Notes (Signed)
Gastroenterology Pre-Procedure Review  Request Date: 10/28/20 Requesting Physician: Dr. Vicente Males  PATIENT REVIEW QUESTIONS: The patient responded to the following health history questions as indicated:    1. Are you having any GI issues? no 2. Do you have a personal history of Polyps? no 3. Do you have a family history of Colon Cancer or Polyps? no 4. Diabetes Mellitus? no 5. Joint replacements in the past 12 months?no 6. Major health problems in the past 3 months?no 7. Any artificial heart valves, MVP, or defibrillator?no    MEDICATIONS & ALLERGIES:    Patient reports the following regarding taking any anticoagulation/antiplatelet therapy:   Plavix, Coumadin, Eliquis, Xarelto, Lovenox, Pradaxa, Brilinta, or Effient? no Aspirin? yes (Aspirin 325mg ) Not prescribed by physician.  Patient confirms/reports the following medications:  Current Outpatient Medications  Medication Sig Dispense Refill  . ALPRAZolam (XANAX) 0.25 MG tablet Take 1 tablet (0.25 mg total) by mouth 2 (two) times daily as needed. for anxiety 45 tablet 2  . cholecalciferol (VITAMIN D3) 25 MCG (1000 UNIT) tablet Take 1,000 Units by mouth daily.    . Cyanocobalamin (VITAMIN B 12 PO) Take by mouth.    . desvenlafaxine (PRISTIQ) 100 MG 24 hr tablet Take 1 tablet (100 mg total) by mouth every morning. 90 tablet 1  . desvenlafaxine (PRISTIQ) 50 MG 24 hr tablet Take 1 tablet (50 mg total) by mouth every evening. 90 tablet 1  . Dulaglutide (TRULICITY) 1.5 HE/1.7EY SOPN Inject 1.5 mg into the skin once a week. 3 mL 12  . hydrOXYzine (VISTARIL) 50 MG capsule Take 1 capsule by mouth three times daily as needed 90 capsule 0  . lamoTRIgine (LAMICTAL) 150 MG tablet Take 1 tablet (150 mg total) by mouth 2 (two) times daily. 180 tablet 1  . levothyroxine (SYNTHROID) 25 MCG tablet Take 1 tablet (25 mcg total) by mouth daily before breakfast. 90 tablet 2  . lurasidone (LATUDA) 80 MG TABS tablet Take 1 tablet (80 mg total) by mouth daily  with breakfast. 90 tablet 0  . omeprazole (PRILOSEC) 40 MG capsule Take 1 capsule (40 mg total) by mouth daily. 30 capsule 3  . Oxcarbazepine (TRILEPTAL) 300 MG tablet Take 1 tablet (300 mg total) by mouth 2 (two) times daily. 180 tablet 0  . propranolol (INDERAL) 20 MG tablet Take 1 tablet (20 mg total) by mouth 3 (three) times daily as needed. 270 tablet 1  . oxyCODONE-acetaminophen (PERCOCET/ROXICET) 5-325 MG tablet Take 1 tablet by mouth every 6 (six) hours as needed for severe pain. (Patient not taking: Reported on 10/07/2020) 5 tablet 0   No current facility-administered medications for this visit.    Patient confirms/reports the following allergies:  No Known Allergies  No orders of the defined types were placed in this encounter.   AUTHORIZATION INFORMATION Primary Insurance: 1D#: Group #:  Secondary Insurance: 1D#: Group #:  SCHEDULE INFORMATION: Date: 10/28/20   Time: Location: [facility]

## 2020-10-07 NOTE — Telephone Encounter (Signed)
Noted thank you

## 2020-10-07 NOTE — Telephone Encounter (Signed)
FMLA form is completed, signed and faxed to [facility]

## 2020-10-10 ENCOUNTER — Telehealth: Payer: Self-pay

## 2020-10-10 NOTE — Telephone Encounter (Signed)
-----   Message from Lavera Guise, MD sent at 10/10/2020 11:31 AM EST ----- Please advise pt when she goes for labs make sure to tell them they are drawing labs for B12 and thyroid

## 2020-10-10 NOTE — Telephone Encounter (Signed)
Called and spoke to pt to let her know to make sure to tell the lab person that she needs labs for B12 and thyroid drawn.  Pt understood instructions

## 2020-10-11 ENCOUNTER — Ambulatory Visit: Payer: 59 | Admitting: Psychiatry

## 2020-10-12 ENCOUNTER — Ambulatory Visit: Payer: 59 | Admitting: Psychiatry

## 2020-10-19 ENCOUNTER — Other Ambulatory Visit: Payer: Self-pay | Admitting: Psychiatry

## 2020-10-19 DIAGNOSIS — F411 Generalized anxiety disorder: Secondary | ICD-10-CM

## 2020-10-25 ENCOUNTER — Other Ambulatory Visit: Payer: Self-pay

## 2020-10-25 ENCOUNTER — Ambulatory Visit (INDEPENDENT_AMBULATORY_CARE_PROVIDER_SITE_OTHER): Payer: 59 | Admitting: Psychiatry

## 2020-10-25 DIAGNOSIS — R69 Illness, unspecified: Secondary | ICD-10-CM | POA: Diagnosis not present

## 2020-10-25 DIAGNOSIS — F411 Generalized anxiety disorder: Secondary | ICD-10-CM | POA: Diagnosis not present

## 2020-10-25 NOTE — Progress Notes (Signed)
[facility] Counselor/Therapist Progress Note  Patient ID: Ashley Stephens, MRN: [id number],    Date: 10/25/2020  Time Spent: 60 minutes    8:00am to 9:00am  Treatment Type: Individual Therapy  Reported Symptoms: Anxiety, depression  Mental Status Exam:  Appearance:   Casual     Behavior:  Appropriate, Sharing and Motivated  Motor:  Normal  Speech/Language:   Clear and Coherent  Affect:  anxiety, depressed  Mood:  anxious and depressed  Thought process:  goal directed  Thought content:    WNL  Sensory/Perceptual disturbances:    WNL  Orientation:  oriented to person, place, time/date, situation, day of week, month of year and year  Attention:  Fair  Concentration:  Fair  Memory:  WNL  Fund of knowledge:   Good  Insight:    Good  Judgment:   Good  Impulse Control:  Good   Risk Assessment: Danger to Self:  No Self-injurious Behavior: No Danger to Others: No Duty to Warn:no Physical Aggression / Violence:No  Access to Firearms a concern: No  Gang Involvement:No   Subjective: Patient today reports symptoms of anxiety and depression, with anxiety being the stronger symptom.     Interventions: Solution-Oriented/Positive Psychology and Ego-Supportive  Diagnosis:   ICD-10-CM   1. Generalized anxiety disorder  F41.1      Plan: Patient not signing tx plan on computer screen due to [place].  Treatment Goals: Goals remain on treatment plan as patient works with strategies to achieve her goals. Progress is noted each session and documented in "Progress" section of Note.   Long term goal: Develop the ability to recognize, accept, and cope with feelings of depression and anxiety.  Short term goal: Learn and implement personal skills for managing stress, solving daily problems, and resolving conflicts effectively.  Strategy: Teach patient calming strategies, problem solving skills, and conflict resolution skills to better manage daily  stressors.  Progressing- Patient in today reporting anxiety and says "I've been intensely anxious along with the depression, but have been functioning at home and work. 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 Shares frustrations of work, some family, and "people that bother me and talk mean to me and underestimate me." Processed her anxious, irritable, depresed feelings in session today, using the long and short term goals and strategy in her tx plan above. Feels strongly that most of her issues are because of her job and states she just needs to be switched to other dept at work that is Mon-Fri, 8-5. States she has told supervisors that also. Supportive of her in her struggles today as is seems she's not feeling much support/empathy from others.  Has a colonoscopy coming up end of week and talked through some dread of that as this is her first experience for that procedure. Was more grounded by session and and to work more between appointments on her anxious and depressed thoughts that lead to anxious and depressed feelings, using goal strategies above.  Encouraged patient to practice more positive and affirming self talk as we discussed last session.  Also encouraged her looking intentionally for more positives and negatives daily, focus on what she can control, stay in the present, take small breaks during the day as she is able, practice taking 1 day at the time, getting outside some every day, and staying in contact  with people who are supportive of her, while setting and keeping appropriate boundaries.   Goal review and progress/challenges noted with patient.  Next appointment within 2-3 weeks   [name], LCSW

## 2020-10-26 ENCOUNTER — Other Ambulatory Visit
Admission: RE | Admit: 2020-10-26 | Discharge: 2020-10-26 | Disposition: A | Discharge status: Home / Self Care | Payer: 59 | Source: Ambulatory Visit | Attending: Gastroenterology | Admitting: Gastroenterology

## 2020-10-26 DIAGNOSIS — Z20822 Contact with and (suspected) exposure to covid-19: Secondary | ICD-10-CM | POA: Insufficient documentation

## 2020-10-26 DIAGNOSIS — Z01812 Encounter for preprocedural laboratory examination: Secondary | ICD-10-CM | POA: Diagnosis not present

## 2020-10-26 LAB — SARS CORONAVIRUS 2 (TAT 6-24 HRS): SARS Coronavirus 2: NEGATIVE

## 2020-10-27 ENCOUNTER — Encounter: Payer: Self-pay | Admitting: Gastroenterology

## 2020-10-28 ENCOUNTER — Ambulatory Visit: Payer: 59 | Admitting: Registered Nurse

## 2020-10-28 ENCOUNTER — Other Ambulatory Visit: Payer: Self-pay

## 2020-10-28 ENCOUNTER — Ambulatory Visit
Admission: RE | Admit: 2020-10-28 | Discharge: 2020-10-28 | Disposition: A | Discharge status: Home / Self Care | Payer: 59 | Attending: Gastroenterology | Admitting: Gastroenterology

## 2020-10-28 ENCOUNTER — Encounter: Payer: Self-pay | Admitting: Gastroenterology

## 2020-10-28 ENCOUNTER — Encounter
Admission: RE | Disposition: A | Discharge status: Home / Self Care | Payer: Self-pay | Source: Home / Self Care | Attending: Gastroenterology

## 2020-10-28 DIAGNOSIS — Z9884 Bariatric surgery status: Secondary | ICD-10-CM | POA: Diagnosis not present

## 2020-10-28 DIAGNOSIS — K635 Polyp of colon: Secondary | ICD-10-CM | POA: Diagnosis not present

## 2020-10-28 DIAGNOSIS — D122 Benign neoplasm of ascending colon: Secondary | ICD-10-CM | POA: Insufficient documentation

## 2020-10-28 DIAGNOSIS — Z79890 Hormone replacement therapy (postmenopausal): Secondary | ICD-10-CM | POA: Diagnosis not present

## 2020-10-28 DIAGNOSIS — Z90710 Acquired absence of both cervix and uterus: Secondary | ICD-10-CM | POA: Diagnosis not present

## 2020-10-28 DIAGNOSIS — D123 Benign neoplasm of transverse colon: Secondary | ICD-10-CM | POA: Insufficient documentation

## 2020-10-28 DIAGNOSIS — Z79899 Other long term (current) drug therapy: Secondary | ICD-10-CM | POA: Insufficient documentation

## 2020-10-28 DIAGNOSIS — Z1211 Encounter for screening for malignant neoplasm of colon: Secondary | ICD-10-CM | POA: Diagnosis not present

## 2020-10-28 DIAGNOSIS — E119 Type 2 diabetes mellitus without complications: Secondary | ICD-10-CM | POA: Diagnosis not present

## 2020-10-28 DIAGNOSIS — I10 Essential (primary) hypertension: Secondary | ICD-10-CM | POA: Diagnosis not present

## 2020-10-28 HISTORY — DX: Type 2 diabetes mellitus without complications: E11.9

## 2020-10-28 HISTORY — PX: COLONOSCOPY WITH PROPOFOL: SHX5780

## 2020-10-28 LAB — GLUCOSE, CAPILLARY: Glucose-Capillary: 94 mg/dL (ref 70–99)

## 2020-10-28 SURGERY — COLONOSCOPY WITH PROPOFOL
Anesthesia: General

## 2020-10-28 MED ORDER — DEXMEDETOMIDINE (PRECEDEX) IN NS 20 MCG/5ML (4 MCG/ML) IV SYRINGE
PREFILLED_SYRINGE | INTRAVENOUS | Status: AC
  Filled 2020-10-28: qty 5

## 2020-10-28 MED ORDER — PROPOFOL 500 MG/50ML IV EMUL
INTRAVENOUS | Status: AC
  Filled 2020-10-28: qty 50

## 2020-10-28 MED ORDER — SODIUM CHLORIDE 0.9 % IV SOLN: INTRAVENOUS | Status: DC

## 2020-10-28 MED ORDER — DEXMEDETOMIDINE (PRECEDEX) IN NS 20 MCG/5ML (4 MCG/ML) IV SYRINGE
PREFILLED_SYRINGE | INTRAVENOUS | Status: DC | PRN
  Administered 2020-10-28: 20 ug via INTRAVENOUS

## 2020-10-28 MED ORDER — PROPOFOL 500 MG/50ML IV EMUL
INTRAVENOUS | Status: DC | PRN
  Administered 2020-10-28: 175 ug/kg/min via INTRAVENOUS

## 2020-10-28 MED ORDER — PROPOFOL 10 MG/ML IV BOLUS
INTRAVENOUS | Status: DC | PRN
Start: 2020-10-28 — End: 2020-10-28
  Administered 2020-10-28: 30 mg via INTRAVENOUS
  Administered 2020-10-28: 100 mg via INTRAVENOUS

## 2020-10-28 MED ORDER — LIDOCAINE HCL (CARDIAC) PF 100 MG/5ML IV SOSY
PREFILLED_SYRINGE | INTRAVENOUS | Status: DC | PRN
  Administered 2020-10-28: 40 mg via INTRAVENOUS

## 2020-10-28 NOTE — Op Note (Signed)
[facility]enterology Patient Name: Ashley Stephens Procedure Date: 10/28/2020 8:36 AM MRN: [id number] Account #: [id number] Date of Birth: 11/08/1975 Admit Type: Outpatient Age: 45 Room: [facility] ENDO ROOM 4 Gender: Female Note Status: Finalized Procedure:             Colonoscopy Indications:           Screening for colorectal malignant neoplasm Providers:             Jonathon Bellows MD, MD Referring MD:          Lavera Guise, MD (Referring MD) Medicines:             Monitored Anesthesia Care Complications:         No immediate complications. Procedure:             Pre-Anesthesia Assessment:                        - Prior to the procedure, a History and Physical was                         performed, and patient medications, allergies and                         sensitivities were reviewed. The patient's tolerance                         of previous anesthesia was reviewed.                        - The risks and benefits of the procedure and the                         sedation options and risks were discussed with the                         patient. All questions were answered and informed                         consent was obtained.                        - ASA Grade Assessment: II - A patient with mild                         systemic disease.                        After obtaining informed consent, the colonoscope was                         passed under direct vision. Throughout the procedure,                         the patient's blood pressure, pulse, and oxygen                         saturations were monitored continuously. The                         Colonoscope was introduced through the anus  and                         advanced to the the cecum, identified by the                         appendiceal orifice. The colonoscopy was performed                         with ease. The patient tolerated the procedure well.                         The quality  of the bowel preparation was excellent. Findings:      The perianal and digital rectal examinations were normal.      Two sessile polyps were found in the transverse colon and ascending       colon. The polyps were 3 to 4 mm in size. These polyps were removed with       a cold biopsy forceps. Resection and retrieval were complete.      Two sessile polyps were found in the ascending colon. The polyps were 4       to 5 mm in size. These polyps were removed with a cold snare. Resection       and retrieval were complete.      The exam was otherwise without abnormality on direct and retroflexion       views. Impression:            - Two 3 to 4 mm polyps in the transverse colon and in                         the ascending colon, removed with a cold biopsy                         forceps. Resected and retrieved.                        - Two 4 to 5 mm polyps in the ascending colon, removed                         with a cold snare. Resected and retrieved.                        - The examination was otherwise normal on direct and                         retroflexion views. Recommendation:        - Discharge patient to home (with escort).                        - Resume previous diet.                        - Continue present medications.                        - Await pathology results.                        - Repeat colonoscopy for surveillance based on  pathology results. Procedure Code(s):     --- Professional ---                        [phone], Colonoscopy, flexible; with removal of                         tumor(s), polyp(s), or other lesion(s) by snare                         technique                        45380, 37, Colonoscopy, flexible; with biopsy, single                         or multiple Diagnosis Code(s):     --- Professional ---                        Z12.11, Encounter for screening for malignant neoplasm                         of colon                         K63.5, Polyp of colon CPT copyright 2019 American Medical Association. All rights reserved. The codes documented in this report are preliminary and upon coder review may  be revised to meet current compliance requirements. Jonathon Bellows, MD Jonathon Bellows MD, MD 10/28/2020 9:10:48 AM This report has been signed electronically. Number of Addenda: 0 Note Initiated On: 10/28/2020 8:36 AM Scope Withdrawal Time: 0 hours 21 minutes 21 seconds  Total Procedure Duration: 0 hours 24 minutes 16 seconds  Estimated Blood Loss:  Estimated blood loss: none.      [facility]

## 2020-10-28 NOTE — H&P (Signed)
Jonathon Bellows, MD [address] [place], [place], [place], [address] [address], [place], [place], [place], [address] Phone: [phone]  Fax: [phone]  Primary Care Physician:  Lavera Guise, MD   Pre-Procedure History & Physical: HPI:  Libia Fazzini is a 45 y.o. female is here for an colonoscopy.   Past Medical History:  Diagnosis Date  . Anxiety   . Depression   . Diabetes mellitus without complication ([place])   . Gastric ulcer   . GERD (gastroesophageal reflux disease)   . Hypertension   . Osteoarthritis   . Thyroid disorder     Past Surgical History:  Procedure Laterality Date  . ABDOMINAL HYSTERECTOMY  2013  . BARIATRIC SURGERY    . BREAST BIOPSY  1995  . GASTRIC BYPASS  2012  . TUBAL LIGATION  1996  . tummy tuck  05/27/2020    Prior to Admission medications   Medication Sig Start Date End Date Taking? Authorizing Provider  ALPRAZolam Duanne Moron) 0.25 MG tablet Take 1 tablet by mouth twice daily as needed for anxiety 10/20/20  Yes [name], PMHNP  cholecalciferol (VITAMIN D3) 25 MCG (1000 UNIT) tablet Take 1,000 Units by mouth daily.   Yes [provider]  Cyanocobalamin (VITAMIN B 12 PO) Take by mouth.   Yes [provider]  desvenlafaxine (PRISTIQ) 100 MG 24 hr tablet Take 1 tablet (100 mg total) by mouth every morning. 08/12/20  Yes [name], PMHNP  desvenlafaxine (PRISTIQ) 50 MG 24 hr tablet Take 1 tablet (50 mg total) by mouth every evening. 08/12/20  Yes [name], PMHNP  Dulaglutide (TRULICITY) 1.5 WV/3.7TG SOPN Inject 1.5 mg into the skin once a week. 06/17/20  Yes Lavera Guise, MD  hydrOXYzine (VISTARIL) 50 MG capsule Take 1 capsule by mouth three times daily as needed 08/12/20  Yes [name], PMHNP  lamoTRIgine (LAMICTAL) 150 MG tablet Take 1 tablet (150 mg total) by mouth 2 (two) times daily. 05/12/20  Yes [name], PMHNP  levothyroxine (SYNTHROID) 25 MCG tablet Take 1 tablet (25 mcg  total) by mouth daily before breakfast. 10/03/20  Yes McDonough, Lauren K, PA-C  lurasidone (LATUDA) 80 MG TABS tablet Take 1 tablet (80 mg total) by mouth daily with breakfast. 08/12/20 11/10/20 Yes [name], PMHNP  omeprazole (PRILOSEC) 40 MG capsule Take 1 capsule (40 mg total) by mouth daily. 10/03/20  Yes McDonough, Si Gaul, PA-C  Oxcarbazepine (TRILEPTAL) 300 MG tablet Take 1 tablet (300 mg total) by mouth 2 (two) times daily. 08/12/20 11/10/20 Yes [name], PMHNP  propranolol (INDERAL) 20 MG tablet Take 1 tablet (20 mg total) by mouth 3 (three) times daily as needed. 08/12/20 11/10/20 Yes [name], PMHNP  oxyCODONE-acetaminophen (PERCOCET/ROXICET) 5-325 MG tablet Take 1 tablet by mouth every 6 (six) hours as needed for severe pain. Patient not taking: Reported on 10/07/2020 07/12/20   Quintella Reichert, MD    Allergies as of 10/07/2020  . (No Known Allergies)    Family History  Problem Relation Age of Onset  . Breast cancer Maternal Grandmother     Social History   Socioeconomic History  . Marital status: Single    Spouse name: Not on file  . Number of children: 1  . Years of education: Not on file  . Highest education level: Not on file  Occupational History  . Occupation: Engineer, drilling  Tobacco Use  . Smoking status: Never Smoker  . Smokeless tobacco: Never Used  Vaping Use  .  Vaping Use: Never used  Substance and Sexual Activity  . Alcohol use: No    Alcohol/week: 0.0 standard drinks  . Drug use: No  . Sexual activity: Yes    Birth control/protection: Surgical  Other Topics Concern  . Not on file  Social History Narrative  . Not on file   Social Determinants of Health   Financial Resource Strain: Not on file  Food Insecurity: No Food Insecurity  . Worried About Charity fundraiser in the Last Year: Never true  . Ran Out of Food in the Last Year: Never true  Transportation Needs: No Transportation Needs  . Lack of Transportation (Medical): No   . Lack of Transportation (Non-Medical): No  Physical Activity: Not on file  Stress: Not on file  Social Connections: Not on file  Intimate Partner Violence: Not on file    Review of Systems: See HPI, otherwise negative ROS  Physical Exam: BP (!) 149/81   Pulse 79   Temp (!) 97 F (36.1 C) (Temporal)   Resp 18   Ht 5' 7.5" (1.715 m)   Wt 113.4 kg   SpO2 98%   BMI 38.58 kg/m  General:   Alert,  pleasant and cooperative in NAD Head:  Normocephalic and atraumatic. Neck:  Supple; no masses or thyromegaly. Lungs:  Clear throughout to auscultation, normal respiratory effort.    Heart:  +S1, +S2, Regular rate and rhythm, No edema. Abdomen:  Soft, nontender and nondistended. Normal bowel sounds, without guarding, and without rebound.   Neurologic:  Alert and  oriented x4;  grossly normal neurologically.  Impression/Plan: [name] is here for an colonoscopy to be performed for Screening colonoscopy average risk   Risks, benefits, limitations, and alternatives regarding  colonoscopy have been reviewed with the patient.  Questions have been answered.  All parties agreeable.   Jonathon Bellows, MD  10/28/2020, 8:31 AM

## 2020-10-28 NOTE — Anesthesia Preprocedure Evaluation (Signed)
Anesthesia Evaluation  Patient identified by MRN, date of birth, ID band Patient awake    Reviewed: Allergy & Precautions, NPO status , Patient's Chart, lab work & pertinent test results  Airway Mallampati: III       Dental   Pulmonary neg pulmonary ROS,    Pulmonary exam normal        Cardiovascular hypertension, Normal cardiovascular exam     Neuro/Psych PSYCHIATRIC DISORDERS Anxiety Depression negative neurological ROS     GI/Hepatic Neg liver ROS, PUD, GERD  Medicated,  Endo/Other  diabetesHypothyroidism   Renal/GU negative Renal ROS  negative genitourinary   Musculoskeletal  (+) Arthritis ,   Abdominal Normal abdominal exam  (+)   Peds negative pediatric ROS (+)  Hematology negative hematology ROS (+)   Anesthesia Other Findings Past Medical History: No date: Anxiety No date: Depression No date: Diabetes mellitus without complication ([facility]) No date: Gastric ulcer No date: GERD (gastroesophageal reflux disease) No date: Hypertension No date: Osteoarthritis No date: Thyroid disorder  Reproductive/Obstetrics                             Anesthesia Physical Anesthesia Plan  ASA: II  Anesthesia Plan: General   Post-op Pain Management:    Induction: Intravenous  PONV Risk Score and Plan: Propofol infusion  Airway Management Planned: Nasal Cannula  Additional Equipment:   Intra-op Plan:   Post-operative Plan:   Informed Consent: I have reviewed the patients History and Physical, chart, labs and discussed the procedure including the risks, benefits and alternatives for the proposed anesthesia with the patient or authorized representative who has indicated his/her understanding and acceptance.     Dental advisory given  Plan Discussed with: CRNA and Surgeon  Anesthesia Plan Comments:         Anesthesia Quick Evaluation

## 2020-10-28 NOTE — Transfer of Care (Signed)
Immediate Anesthesia Transfer of Care Note  Patient: Ashley Stephens  Procedure(s) Performed: Procedure(s): COLONOSCOPY WITH PROPOFOL (N/A)  Patient Location: PACU and Endoscopy Unit  Anesthesia Type:General  Level of Consciousness: sedated  Airway & Oxygen Therapy: Patient Spontanous Breathing and Patient connected to nasal cannula oxygen  Post-op Assessment: Report given to RN and Post -op Vital signs reviewed and stable  Post vital signs: Reviewed and stable  Last Vitals:  Vitals:   10/28/20 0910 10/28/20 0914  BP: (!) 114/59 (!) 114/59  Pulse: 89 88  Resp: 20 20  Temp: (!) 36.1 C   SpO2: 80% 06%    Complications: No apparent anesthesia complications

## 2020-10-31 ENCOUNTER — Encounter: Payer: Self-pay | Admitting: Gastroenterology

## 2020-10-31 LAB — SURGICAL PATHOLOGY

## 2020-11-01 ENCOUNTER — Encounter: Payer: Self-pay | Admitting: Gastroenterology

## 2020-11-01 NOTE — Anesthesia Postprocedure Evaluation (Signed)
Anesthesia Post Note  Patient: Ashley Stephens  Procedure(s) Performed: COLONOSCOPY WITH PROPOFOL (N/A )  Patient location during evaluation: Endoscopy Anesthesia Type: General Level of consciousness: awake and alert and oriented Pain management: pain level controlled Vital Signs Assessment: post-procedure vital signs reviewed and stable Respiratory status: spontaneous breathing Cardiovascular status: blood pressure returned to baseline Anesthetic complications: no   No complications documented.   Last Vitals:  Vitals:   10/28/20 0930 10/28/20 0940  BP: 136/81   Pulse: 90 83  Resp: 18 18  Temp:    SpO2: 99% 99%    Last Pain:  Vitals:   10/29/20 1125  TempSrc:   PainSc: 0-No pain                 Adalid Beckmann

## 2020-11-07 ENCOUNTER — Other Ambulatory Visit: Payer: Self-pay

## 2020-11-07 ENCOUNTER — Ambulatory Visit (INDEPENDENT_AMBULATORY_CARE_PROVIDER_SITE_OTHER): Payer: 59 | Admitting: Psychiatry

## 2020-11-07 ENCOUNTER — Encounter: Payer: Self-pay | Admitting: Psychiatry

## 2020-11-07 DIAGNOSIS — F411 Generalized anxiety disorder: Secondary | ICD-10-CM

## 2020-11-07 DIAGNOSIS — R69 Illness, unspecified: Secondary | ICD-10-CM | POA: Diagnosis not present

## 2020-11-07 DIAGNOSIS — F39 Unspecified mood [affective] disorder: Secondary | ICD-10-CM

## 2020-11-07 DIAGNOSIS — F5101 Primary insomnia: Secondary | ICD-10-CM | POA: Diagnosis not present

## 2020-11-07 MED ORDER — HYDROXYZINE PAMOATE 50 MG PO CAPS: ORAL_CAPSULE | ORAL | 0 refills | Status: DC

## 2020-11-07 MED ORDER — OXCARBAZEPINE 300 MG PO TABS: 300.0000 mg | ORAL_TABLET | Freq: Two times a day (BID) | ORAL | 0 refills | Status: DC

## 2020-11-07 MED ORDER — LAMOTRIGINE 150 MG PO TABS: 150.0000 mg | ORAL_TABLET | Freq: Two times a day (BID) | ORAL | 1 refills | Status: DC

## 2020-11-07 MED ORDER — LURASIDONE HCL 80 MG PO TABS: 80.0000 mg | ORAL_TABLET | Freq: Every day | ORAL | 0 refills | Status: DC

## 2020-11-07 MED ORDER — ALPRAZOLAM 0.25 MG PO TABS: 0.2500 mg | ORAL_TABLET | Freq: Two times a day (BID) | ORAL | 2 refills | Status: DC | PRN

## 2020-11-07 NOTE — Progress Notes (Signed)
Ashley Stephens [id number] February 05, 1976 45 y.o.  Subjective:   Patient ID:  Ashley Stephens is a 49 y.o. (DOB 16-Jan-1976) female.  Chief Complaint:  Chief Complaint  Patient presents with  . Anxiety  . Insomnia  . Other    Mood disturbance    HPI Ashley Stephens presents to the office today for follow-up of anxiety, mood disturbance, and insomnia. She reports that she is "not in a good place... anxiety and depression." Denies panic s/s. She reports "the overwhelming feeling of sadness and defeat." She reports that she has had some persistent sadness. She reports that irritability has been ok. She reports that she is "an over-thinker." She reports some rumination. She reports sleep disturbance. She reports that Benadryl is helpful for sleep initiation. Energy and motivation have been low. She reports difficulty with concentration. She reports that at times she forgets what task she was about to complete. Denies SI. She reports some passive death wishes.   She reports, "work is the trigger." She believes that she is being paid less due to having to use FMLA. She reports that her VPN went out when she was talking to HR and their call went out and HR called the police- "that made me sad and angry." She reports that her current work schedule is 11 am-8 pm Sunday through Thursday and reports that this is not helpful for her mental health. She reports that it is disrupting her sleep schedule and limiting the time she can do errands and apts outside of work. She reports that she was trying to speak with her work to discuss her concerns. She reports that she is not able to attend worship services.   Past medication trials: Wellbutrin Effexor Pristiq-effective Lamictal-effective for mood Latuda Xanax- Usually takes at night Propanolol- Effective Hydroxyzine Rexulti Gabapentin- prescribed for back pain. Dizziness, headaches.  Lithium Trileptal  [place] Office  Visit from 08/12/2020 in [place] Office Visit from 04/11/2020 in [place] Visit from 11/27/2019 in [place] Visit from 08/14/2019 in [place] Visit from 05/29/2019 in [place] Total Score 0 0 0 0 [place] Office Visit from 12/25/2019 in [place]  Total GAD-7 Score 16    PHQ2-9   [place] Visit from 10/03/2020 in [facility], [facility] Patient Outreach Telephone from 08/22/2020 in [place] Visit from 05/24/2020 in [place], [facility] Office Visit from 04/05/2020 in [facility], [facility] Office Visit from 12/25/2019 in [facility]  PHQ-2 Total Score 3 2 0 0 5  PHQ-9 Total Score -- 4 -- -- 18    Flowsheet Row Admission (Discharged) from 10/28/2020 in [place] No Risk       Review of Systems:  Review of Systems  Gastrointestinal:       Had recent colonoscopy and this was WNL.  Musculoskeletal: Negative for gait problem.  Neurological: Negative for tremors.  Psychiatric/Behavioral:       Please refer to HPI    Medications: I have reviewed the patient's current medications.  Current Outpatient Medications  Medication Sig Dispense Refill  . [START ON 11/17/2020] ALPRAZolam (XANAX) 0.25 MG tablet Take 1 tablet (0.25 mg total) by mouth 2 (two) times daily as needed. for anxiety 45 tablet 2  . cholecalciferol (VITAMIN D3) 25 MCG (1000  UNIT) tablet Take 1,000 Units by mouth daily.    . Cyanocobalamin (VITAMIN B 12 PO) Take by mouth.    . desvenlafaxine (PRISTIQ) 100 MG 24 hr tablet Take 1 tablet (100 mg total) by mouth every morning. 90 tablet 1  . desvenlafaxine (PRISTIQ) 50 MG 24 hr tablet Take 1 tablet (50 mg total) by mouth every evening. 90 tablet 1  . Dulaglutide (TRULICITY)  1.5 XT/0.6YI SOPN Inject 1.5 mg into the skin once a week. 3 mL 12  . hydrOXYzine (VISTARIL) 50 MG capsule Take 1 capsule by mouth three times daily as needed 90 capsule 0  . lamoTRIgine (LAMICTAL) 150 MG tablet Take 1 tablet (150 mg total) by mouth 2 (two) times daily. 180 tablet 1  . levothyroxine (SYNTHROID) 25 MCG tablet Take 1 tablet (25 mcg total) by mouth daily before breakfast. 90 tablet 2  . lurasidone (LATUDA) 80 MG TABS tablet Take 1 tablet (80 mg total) by mouth daily with breakfast. 90 tablet 0  . omeprazole (PRILOSEC) 40 MG capsule Take 1 capsule (40 mg total) by mouth daily. 30 capsule 3  . Oxcarbazepine (TRILEPTAL) 300 MG tablet Take 1 tablet (300 mg total) by mouth 2 (two) times daily. 180 tablet 0  . oxyCODONE-acetaminophen (PERCOCET/ROXICET) 5-325 MG tablet Take 1 tablet by mouth every 6 (six) hours as needed for severe pain. (Patient not taking: Reported on 10/07/2020) 5 tablet 0  . propranolol (INDERAL) 20 MG tablet Take 1 tablet (20 mg total) by mouth 3 (three) times daily as needed. 270 tablet 1   No current facility-administered medications for this visit.    Medication Side Effects: None  Allergies: No Known Allergies  Past Medical History:  Diagnosis Date  . Anxiety   . Depression   . Diabetes mellitus without complication ([place])   . Gastric ulcer   . GERD (gastroesophageal reflux disease)   . Hypertension   . Osteoarthritis   . Thyroid disorder     Family History  Problem Relation Age of Onset  . Breast cancer Maternal Grandmother     Social History   Socioeconomic History  . Marital status: Single    Spouse name: Not on file  . Number of children: 1  . Years of education: Not on file  . Highest education level: Not on file  Occupational History  . Occupation: Engineer, drilling  Tobacco Use  . Smoking status: Never Smoker  . Smokeless tobacco: Never Used  Vaping Use  . Vaping Use: Never used  Substance and Sexual Activity  . Alcohol use: No     Alcohol/week: 0.0 standard drinks  . Drug use: No  . Sexual activity: Yes    Birth control/protection: Surgical  Other Topics Concern  . Not on file  Social History Narrative  . Not on file   Social Determinants of Health   Financial Resource Strain: Not on file  Food Insecurity: No Food Insecurity  . Worried About Charity fundraiser in the Last Year: Never true  . Ran Out of Food in the Last Year: Never true  Transportation Needs: No Transportation Needs  . Lack of Transportation (Medical): No  . Lack of Transportation (Non-Medical): No  Physical Activity: Not on file  Stress: Not on file  Social Connections: Not on file  Intimate Partner Violence: Not on file    Past Medical History, Surgical history, Social history, and Family history were reviewed and updated as appropriate.   Please see review of systems for further details  on the patient's review from today.   Objective:   Physical Exam:  There were no vitals taken for this visit.  Physical Exam Constitutional:      General: She is not in acute distress. Musculoskeletal:        General: No deformity.  Neurological:     Mental Status: She is alert and oriented to person, place, and time.     Coordination: Coordination normal.  Psychiatric:        Attention and Perception: Attention and perception normal. She does not perceive auditory or visual hallucinations.        Mood and Affect: Mood is anxious and depressed. Affect is not labile, blunt, angry or inappropriate.        Speech: Speech normal.        Behavior: Behavior normal.        Thought Content: Thought content normal. Thought content is not paranoid or delusional. Thought content does not include homicidal or suicidal ideation. Thought content does not include homicidal or suicidal plan.        Cognition and Memory: Cognition and memory normal.        Judgment: Judgment normal.     Comments: Insight intact     Lab Review:     Component Value  Date/Time   NA 138 07/12/2020 1526   NA 139 07/10/2019 0953   NA 142 12/03/2011 1059   K 4.1 07/12/2020 1526   K 3.4 (L) 12/03/2011 1059   CL 103 07/12/2020 1526   CL 107 12/03/2011 1059   CO2 25 07/12/2020 1526   CO2 27 12/03/2011 1059   GLUCOSE 92 07/12/2020 1526   GLUCOSE 66 12/03/2011 1059   BUN 7 07/12/2020 1526   BUN 9 07/10/2019 0953   BUN 4 (L) 12/03/2011 1059   CREATININE 0.97 07/12/2020 1526   CREATININE 0.82 12/03/2011 1059   CALCIUM 8.9 07/12/2020 1526   CALCIUM 8.5 12/03/2011 1059   PROT 7.1 07/12/2020 1526   PROT 6.5 07/10/2019 0953   ALBUMIN 4.2 07/12/2020 1526   ALBUMIN 4.2 07/10/2019 0953   AST 17 07/12/2020 1526   ALT 14 07/12/2020 1526   ALKPHOS 62 07/12/2020 1526   BILITOT 0.2 (L) 07/12/2020 1526   BILITOT <0.2 07/10/2019 0953   GFRNONAA >60 07/12/2020 1526   GFRNONAA >60 12/03/2011 1059   GFRAA 78 07/10/2019 0953   GFRAA >60 12/03/2011 1059       Component Value Date/Time   WBC 7.0 07/12/2020 1526   RBC 4.76 07/12/2020 1526   HGB 14.0 07/12/2020 1526   HGB 13.5 07/10/2019 0953   HCT 42.1 07/12/2020 1526   HCT 41.0 07/10/2019 0953   PLT 342 07/12/2020 1526   PLT 340 07/10/2019 0953   MCV 88.4 07/12/2020 1526   MCV 86 07/10/2019 0953   MCH 29.4 07/12/2020 1526   MCHC 33.3 07/12/2020 1526   RDW 12.8 07/12/2020 1526   RDW 14.0 07/10/2019 0953   LYMPHSABS 2.4 07/12/2020 1526   LYMPHSABS 1.8 12/27/2017 0740   MONOABS 0.6 07/12/2020 1526   EOSABS 0.1 07/12/2020 1526   EOSABS 0.1 12/27/2017 0740   BASOSABS 0.0 07/12/2020 1526   BASOSABS 0.0 12/27/2017 0740    No results found for: POCLITH, LITHIUM   No results found for: PHENYTOIN, PHENOBARB, VALPROATE, CBMZ   .res Assessment: Plan:    Patient seen for 30 minutes and time spent counseling the patient regarding treatment plan.  Discussed the effect that her current work schedule has on her  mood and anxiety s/s and that current schedule is contributing to sleep disruption and limits time  for self-care and socialization.  Recommend she work Monday through Friday, normal business hours, to help stabilize mood.  Patient wishes to continue current medications without changes since she reports that these medications have been helpful for her mood and anxiety. Recommend continuing psychotherapy with [name], LCSW. Patient to follow-up in 6 to 8 weeks or sooner if clinically indicated. Patient advised to contact office with any questions, adverse effects, or acute worsening in signs and symptoms.   Ashley Stephens was seen today for anxiety, insomnia and other.  Diagnoses and all orders for this visit:  Generalized anxiety disorder -     ALPRAZolam (XANAX) 0.25 MG tablet; Take 1 tablet (0.25 mg total) by mouth 2 (two) times daily as needed. for anxiety -     Oxcarbazepine (TRILEPTAL) 300 MG tablet; Take 1 tablet (300 mg total) by mouth 2 (two) times daily.  Primary insomnia -     hydrOXYzine (VISTARIL) 50 MG capsule; Take 1 capsule by mouth three times daily as needed -     Oxcarbazepine (TRILEPTAL) 300 MG tablet; Take 1 tablet (300 mg total) by mouth 2 (two) times daily.  Mood disorder (HCC) -     lamoTRIgine (LAMICTAL) 150 MG tablet; Take 1 tablet (150 mg total) by mouth 2 (two) times daily. -     lurasidone (LATUDA) 80 MG TABS tablet; Take 1 tablet (80 mg total) by mouth daily with breakfast. -     Oxcarbazepine (TRILEPTAL) 300 MG tablet; Take 1 tablet (300 mg total) by mouth 2 (two) times daily.     Please see After Visit Summary for patient specific instructions.  Future Appointments  Date Time Provider [place]  11/22/2020  8:00 AM [name], LCSW [facility] None  11/25/2020  9:00 AM Wine, Argie Ramming, RN [facility] None  12/05/2020  8:00 AM [name], LCSW CP[facility] None  12/19/2020  8:00 AM [name], LCSW CP-[facility] None  12/26/2020  8:30 AM [name], PMHNP CP-CP None  12/30/2020  8:30 AM McDonough, Si Gaul, PA-C [facility] None  01/02/2021  8:00 AM [name],  LCSW CP-CP None  01/16/2021  8:00 AM [name], LCSW CP-CP None    No orders of the defined types were placed in this encounter.   -------------------------------

## 2020-11-08 ENCOUNTER — Ambulatory Visit (INDEPENDENT_AMBULATORY_CARE_PROVIDER_SITE_OTHER): Payer: 59 | Admitting: Psychiatry

## 2020-11-08 DIAGNOSIS — F411 Generalized anxiety disorder: Secondary | ICD-10-CM

## 2020-11-08 DIAGNOSIS — R69 Illness, unspecified: Secondary | ICD-10-CM | POA: Diagnosis not present

## 2020-11-08 NOTE — Progress Notes (Signed)
      [facility] Counselor/Therapist Progress Note  Patient ID: Ashley Stephens, MRN: [id number],    Date: 11/08/2020  Time Spent: 60 minutes    8:00am to 9:00am   Treatment Type: Individual Therapy  Reported Symptoms: anxiety, depression   Mental Status Exam:  Appearance:   Neat     Behavior:  Appropriate, Sharing and Motivated  Motor:  Normal  Speech/Language:   Clear and Coherent  Affect:  anxious, depressed  Mood:  anxious and depressed  Thought process:  goal directed  Thought content:    WNL  Sensory/Perceptual disturbances:    WNL  Orientation:  oriented to person, place, time/date, situation, day of week, month of year and year  Attention:  Fair  Concentration:  Fair  Memory:  WNL  Fund of knowledge:   Good  Insight:    Good  Judgment:   Good  Impulse Control:  Good   Risk Assessment: Danger to Self:  No Self-injurious Behavior: No Danger to Others: No Duty to Warn:no Physical Aggression / Violence:No  Access to Firearms a concern: No  Gang Involvement:No   Subjective: Patient today reports anxiety and depression. "Past 2 weeks have been a struggle but I've pushed through."  Interventions: Cognitive Behavioral Therapy and Solution-Oriented/Positive Psychology  Diagnosis:   ICD-10-CM   1. Generalized anxiety disorder  F41.1     Plan: Patient not signing tx plan on computer screen due to [place].  Treatment Goals: Goals remain on treatment plan as patient works with strategies to achieve her goals. Progress is noted each session and documented in "Progress" section of Note.   Long term goal: Develop the ability to recognize, accept, and cope with feelings of depression and anxiety.  Short term goal: Learn and implement personal skills for managing stress, solving daily problems, and resolving conflicts effectively.  Strategy: Teach patient calming strategies, problem solving skills, and conflict resolution skills to better manage daily  stressors.  Progressing- Patient in today reporting anxiety and depression with added stress the past couple weeks but patient feels she "pushed through."  Had her first colonoscopy ever and that was stressful but she got through it and reports "everything was fine".  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 Is hopeful it will be a good contact and hopefully lead to a work shift change for patient. Worked with her short term goal and strategy in tx plan above helping patient strengthen conflict management,  communication skills, and self-calming strategies to better manage anxiety, stress, and potential conflicts.  Encouraged patient to continue practicing more positive and affirming self talk, to set and keep appropriate boundaries with others, stay in contact with supportive people, practice taking 1 day at the time and small breaks during the day as she is able, stay in the present focusing on what she can control, intentionally looking for more positives daily, and recognizing and feeling encouraged by the strength that she shows and working on difficult therapeutic issues.   Goal review and progress/challenges noted with patient.  Next appointment within 2 weeks.   [name], LCSW

## 2020-11-22 ENCOUNTER — Other Ambulatory Visit: Payer: Self-pay

## 2020-11-22 ENCOUNTER — Ambulatory Visit (INDEPENDENT_AMBULATORY_CARE_PROVIDER_SITE_OTHER): Payer: 59 | Admitting: Psychiatry

## 2020-11-22 DIAGNOSIS — R69 Illness, unspecified: Secondary | ICD-10-CM | POA: Diagnosis not present

## 2020-11-22 DIAGNOSIS — F411 Generalized anxiety disorder: Secondary | ICD-10-CM | POA: Diagnosis not present

## 2020-11-22 NOTE — Progress Notes (Signed)
[facility] Counselor/Therapist Progress Note  Patient ID: Ashley Stephens, MRN: [id number],    Date: 11/22/2020  Time Spent:  60 minutes     8:00am to 9:00am  Treatment Type: Individual Therapy  Reported Symptoms: anxiety, depression  Mental Status Exam:  Appearance:   Casual     Behavior:  Appropriate, Sharing and Motivated  Motor:  Normal  Speech/Language:   Clear and Coherent  Affect:  anxious, depressed  Mood:  anxious and depressed  Thought process:  goal directed  Thought content:    some tangentiality  Sensory/Perceptual disturbances:    WNL  Orientation:  oriented to person, place, time/date, situation, day of week, month of year and year  Attention:  Good/Fair  Concentration:  Good/Fair  Memory:  WNL  Fund of knowledge:   Good  Insight:    Good and Fair  Judgment:   Good  Impulse Control:  Good and Fair   Risk Assessment: Danger to Self:  No Self-injurious Behavior: No Danger to Others: No Duty to Warn:no Physical Aggression / Violence:No  Access to Firearms a concern: No  Gang Involvement:No   Subjective: Patient today reporting anxiety and depression. "Have struggled and been all over the place past couple weeks mostly related to work, but some family issues persist."   Interventions: Cognitive Behavioral Therapy and Solution-Oriented/Positive Psychology  Diagnosis:   ICD-10-CM   1. Generalized anxiety disorder  F41.1      Plan: Patient not signing tx plan on computer screen due to [place].  Treatment Goals: Goals remain on treatment plan as patient works with strategies to achieve her goals. Progress is noted each session and documented in "Progress" section of Note.   Long term goal: Develop the ability to recognize, accept, and cope with feelings of depression and anxiety.  Short term goal: Learn and implement personal skills for managing stress, solving daily problems, and resolving conflicts  effectively.  Strategy: Teach patient calming strategies, problem solving skills, and conflict resolution skills to better manage daily stressors.  Progressing- Patient in today reporting anxiety and depression.  Irritable due to some work and family issues which she shared at length in session today including her anger, disappointment, and hurt. Focused more on the hurt and trying to heal some from that. States it helps her to sometimes "talk like this rather than holding it in." "But thankful that I went through some of that because it led me away from alcohol and other negative things." States she does have a cousin and daughter that are more of a support to her. Processed some recent developments at work that will have some favorable outcomes for her in changing her work hours within next 1 1/2 months and her pay has already recently increased so she is encouraged about this. "Unhappy with relationships in my life, my son, my mom, and others. I set limits with their drama but it hurts that I can't have better relationships with them. Feels there's nothing more she can do to try and help the relationships and doesn't want to try anymore at this point."  "I do need to keep better boundaries with them and not get pulled into negativity with them." Worked with her on better healthier boundaries, being able to not respond to unhelpful input, communication shills, conflict management, and self-calming strategies, look for more positives daily, staying in the present focusing on what she can change or impact, take 1 day at the time and take small breaks during the  day as she is able, and feeling positive about her efforts and strength she is showing as she pushes forward to feel better about herself and better manage stressors at work and within family.  Goal review and progress/challenges noted with patient.  Next appt within 2 weeks.   [name], LCSW

## 2020-11-25 ENCOUNTER — Other Ambulatory Visit: Payer: Self-pay | Admitting: *Deleted

## 2020-11-25 NOTE — Patient Outreach (Signed)
[place] [facility]) Care Management  11/25/2020  Suzie Vandam 04-Jan-1976 [id number]  Unsuccessful outreach attempt made to patient. Patient answered the phone and confirmed that she was driving. It was decided that this nurse would call her back at a later date for safety reasons.    Plan: RN Health Coach will call patient within the month of April.  [name] RN, BSN [place] [phone] [email or web address]

## 2020-12-01 DIAGNOSIS — L74510 Primary focal hyperhidrosis, axilla: Secondary | ICD-10-CM | POA: Diagnosis not present

## 2020-12-01 DIAGNOSIS — L700 Acne vulgaris: Secondary | ICD-10-CM | POA: Diagnosis not present

## 2020-12-01 DIAGNOSIS — L84 Corns and callosities: Secondary | ICD-10-CM | POA: Diagnosis not present

## 2020-12-05 ENCOUNTER — Ambulatory Visit: Payer: 59 | Admitting: Psychiatry

## 2020-12-07 ENCOUNTER — Other Ambulatory Visit: Payer: Self-pay | Admitting: *Deleted

## 2020-12-07 NOTE — Patient Outreach (Signed)
[place] [facility]) Care Management  12/07/2020  Molina Hollenback Jan 26, 1976 [id number]  Unsuccessful outreach attempt made to patient. RN Health Coach left HIPAA compliant voicemail message along with her contact information.  Plan: RN Health Coach will call patient within the month of May.  [name] RN, BSN [place] [phone] [email or web address]

## 2020-12-19 ENCOUNTER — Other Ambulatory Visit: Payer: Self-pay

## 2020-12-19 ENCOUNTER — Ambulatory Visit (INDEPENDENT_AMBULATORY_CARE_PROVIDER_SITE_OTHER): Payer: 59 | Admitting: Psychiatry

## 2020-12-19 DIAGNOSIS — F411 Generalized anxiety disorder: Secondary | ICD-10-CM

## 2020-12-19 DIAGNOSIS — R69 Illness, unspecified: Secondary | ICD-10-CM | POA: Diagnosis not present

## 2020-12-19 NOTE — Progress Notes (Signed)
[facility] Counselor/Therapist Progress Note  Patient ID: Ashley Stephens, MRN: [id number],    Date: 12/19/2020  Time Spent: 60 minutes   8:00am to 9:00am   Treatment Type: Individual Therapy  Reported Symptoms: anxiety, depression, frustration  Mental Status Exam:  Appearance:   Casual     Behavior:  Appropriate, Sharing and Motivated  Motor:  Normal  Speech/Language:   Clear and Coherent  Affect:  Depressed  Mood:  anxious and depressed  Thought process:  goal directed  Thought content:    WNL  Sensory/Perceptual disturbances:    WNL  Orientation:  oriented to person, place, time/date, situation, day of week, month of year and year  Attention:  Fair  Concentration:  Fair  Memory:  [place] of knowledge:   Good  Insight:    Good and Fair  Judgment:   Good  Impulse Control:  Good   Risk Assessment: Danger to Self:  No Self-injurious Behavior: No Danger to Others: No Duty to Warn:no Physical Aggression / Violence:No  Access to Firearms a concern: No  Gang Involvement:No   Subjective: Patient today reports depression and anxiety. Overwhelmed often but "I've handled things as good as I could but too many things out of my control."  (See Progress Note below.)   Interventions: Cognitive Behavioral Therapy, Solution-Oriented/Positive Psychology and Ego-Supportive  Diagnosis:   ICD-10-CM   1. Generalized anxiety disorder  F41.1      Plan: Patient not signing tx plan on computer screen due to [place].  Treatment Goals: Goals remain on treatment plan as patient works with strategies to achieve her goals. Progress is noted each session and documented in "Progress" section of Note.   Long term goal: Develop the ability to recognize, accept, and cope with feelings of depression and anxiety.  Short term goal: Learn and implement personal skills for managing stress, solving daily problems, and resolving conflicts effectively.  Strategy: Teach  patient calming strategies, problem solving skills, and conflict resolution skills to better manage daily stressors.  Progress: Patient in today reporting anxiety and depression. "People and their issus affect my being depression, and also things outside of me that I can't control. I'm a need to know and a need to be able to act on things but sometimes I can't". Hard to have patience.  Worked more on this today as patient admits it is strongly linked with her emotional and behavioral challenges. Tired today after holiday weekend in which she was active with extended family/friends, stating "it was too much for me and ended up really tired physically and emotionally." Shares and processes challenges being with family for longer period of time when things tend to feel more stressful to patient. Ended up feeling stressed, frustrated, "used", and emotionally tired.  Realizing more her need to set appropriate boundaries with others, especially family.  Worked with her short terms goal and strategy in tx plan above to help patient with anxious, conflicting, and stressful thoughts and trying to more successfully set healthier boundaries, manage conflict in more productive ways, and use better problem solving skills especially when things "don't go as planned". Good news--work schedule to change May 2 to regular Monday-Friday, 8-5.  Looking forward to that. Recognizes her need to let go more of "things out of my control" and we discuss this more today as well as how do practive this more consistently. Encouraged patient in her setting and keeping better boundaries, letting go of "what I can't control", choosing to  not respond to negativity from others, Improved communication skills, intentionally look for positves daily, stay in the present focused on what she can control, practice positive self-care and self-talk, Self-calming strategies discussed in session, take breaks as able during the day, and feel good about the  strength she is showing as she is trying to set limits with negative influences and let go of some past hurts to better manage stress with family and work in the midst of difficult circumstances where she does not have much emotional support.  Goal review and progress/challenges noted with patient.  Next appointment within 2 to 3 weeks.   [name], LCSW

## 2020-12-23 DIAGNOSIS — G4733 Obstructive sleep apnea (adult) (pediatric): Secondary | ICD-10-CM | POA: Diagnosis not present

## 2020-12-23 DIAGNOSIS — K115 Sialolithiasis: Secondary | ICD-10-CM | POA: Diagnosis not present

## 2020-12-26 ENCOUNTER — Other Ambulatory Visit: Payer: Self-pay

## 2020-12-26 ENCOUNTER — Encounter: Payer: Self-pay | Admitting: Psychiatry

## 2020-12-26 ENCOUNTER — Ambulatory Visit (INDEPENDENT_AMBULATORY_CARE_PROVIDER_SITE_OTHER): Payer: 59 | Admitting: Psychiatry

## 2020-12-26 DIAGNOSIS — F411 Generalized anxiety disorder: Secondary | ICD-10-CM | POA: Diagnosis not present

## 2020-12-26 DIAGNOSIS — F5101 Primary insomnia: Secondary | ICD-10-CM

## 2020-12-26 DIAGNOSIS — F39 Unspecified mood [affective] disorder: Secondary | ICD-10-CM | POA: Diagnosis not present

## 2020-12-26 DIAGNOSIS — R69 Illness, unspecified: Secondary | ICD-10-CM | POA: Diagnosis not present

## 2020-12-26 MED ORDER — OXCARBAZEPINE 300 MG PO TABS
300.0000 mg | ORAL_TABLET | Freq: Two times a day (BID) | ORAL | 0 refills | Status: DC
Start: 2020-12-26 — End: 2021-03-02

## 2020-12-26 MED ORDER — HYDROXYZINE PAMOATE 50 MG PO CAPS: ORAL_CAPSULE | ORAL | 0 refills | Status: DC

## 2020-12-26 MED ORDER — DESVENLAFAXINE SUCCINATE ER 100 MG PO TB24: 100.0000 mg | ORAL_TABLET | ORAL | 1 refills | Status: DC

## 2020-12-26 MED ORDER — LURASIDONE HCL 80 MG PO TABS: 80.0000 mg | ORAL_TABLET | Freq: Every day | ORAL | 0 refills | Status: DC

## 2020-12-26 MED ORDER — PROPRANOLOL HCL 20 MG PO TABS: 20.0000 mg | ORAL_TABLET | Freq: Three times a day (TID) | ORAL | 1 refills | Status: DC | PRN

## 2020-12-26 MED ORDER — DESVENLAFAXINE SUCCINATE ER 50 MG PO TB24: 50.0000 mg | ORAL_TABLET | Freq: Every evening | ORAL | 1 refills | Status: DC

## 2020-12-26 NOTE — Progress Notes (Signed)
   12/26/20 0852  Facial and Oral Movements  Muscles of Facial Expression 0  Lips and Perioral Area 0  Jaw 0  Tongue 0  Extremity Movements  Upper (arms, wrists, hands, fingers) 0  Lower (legs, knees, ankles, toes) 0  Trunk Movements  Neck, shoulders, hips 0  Overall Severity  Severity of abnormal movements (highest score from questions above) 0  Incapacitation due to abnormal movements 0  Patient's awareness of abnormal movements (rate only patient's report) 0  AIMS Total Score  AIMS Total Score 0

## 2020-12-26 NOTE — Progress Notes (Signed)
Ashley Stephens [id number] [birth year] 45 y.o.  Subjective:   Patient ID:  Ashley Stephens is a 45 y.o. (DOB 06-30-1976) female.  Chief Complaint:  Chief Complaint  Patient presents with  . Anxiety  . Follow-up    Mood disturbance    HPI Ashley Stephens presents to the office today for follow-up of anxiety, mood disturbance, and insomnia. She reports that she has been "tired" and that current work schedule does not allow adequate time to recover. She reports that her sleep has been somewhat disrupted. She reports that anxiety has been high and thinking, "what's to come?... 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 Denies panic attacks. She reports that she continues to experience depressed mood. She reports that she has been "busy" doing things she needs to do and this is helpful for her mood and anxiety. She reports slight improvement in energy and motivation and reports that energy and motivation remain low. Denies irritability. Appetite has been ok. Concentration has been ok. Denies any impulsive or risky behaviors. Denies SI.   She reports that she will be starting new schedule next week Monday-Friday 8 am-5 pm. 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  She will be moving in June and will renting a house and will now be able to have an office separate from her bedroom. Daughter will be moving out.    Past medication trials: Wellbutrin Effexor Pristiq-effective Lamictal-effective for mood Latuda Xanax- Usually takes at night Propanolol- Effective Hydroxyzine Rexulti Gabapentin- prescribed for back pain. Dizziness, headaches.  Lithium Trileptal   AIMS   Flowsheet Row Office Visit from 12/26/2020 in [place] Office Visit from 08/12/2020 in [place] Office Visit from 04/11/2020 in [place] Visit from 11/27/2019 in [place] Visit from 08/14/2019 in [place] Total Score 0 0 0 0 0    [place] Office Visit from 12/25/2019 in [place]  Total GAD-7 Score 16    PHQ2-9   [place] Visit from 10/03/2020 in [facility], [facility] Patient Outreach Telephone from 08/22/2020 in [place] Visit from 05/24/2020 in [place], [facility] Office Visit from 04/05/2020 in [facility], [facility] Office Visit from 12/25/2019 in [facility]  PHQ-2 Total Score 3 2 0 0 5  PHQ-9 Total Score -- 4 -- -- 18    Flowsheet Row Admission (Discharged) from 10/28/2020 in [place] No Risk       Review of Systems:  Review of Systems  HENT: Positive for dental problem.   Musculoskeletal: Negative for gait problem.  Neurological: Negative for tremors.  Psychiatric/Behavioral:       Please refer to HPI    Medications: I have reviewed the patient's current medications.  Current Outpatient Medications  Medication Sig Dispense Refill  . ALPRAZolam (XANAX) 0.25 MG tablet Take 1 tablet (0.25 mg total) by mouth 2 (two) times daily as needed. for anxiety 45 tablet 2  . cholecalciferol (VITAMIN D3) 25 MCG (1000 UNIT) tablet Take 1,000 Units by mouth daily.    . Cyanocobalamin (VITAMIN B 12 PO) Take by mouth.    . Dulaglutide (TRULICITY) 1.5 GX/2.1JH SOPN Inject 1.5 mg  into the skin once a week. 3 mL 12  . lamoTRIgine (LAMICTAL) 150 MG tablet Take 1 tablet (150 mg total) by mouth 2 (two) times daily. 180 tablet 1  . levothyroxine (SYNTHROID) 25 MCG tablet Take 1 tablet (25 mcg total) by mouth daily before breakfast. 90 tablet 2  . omeprazole (PRILOSEC) 40 MG capsule Take 1 capsule (40 mg total) by  mouth daily. 30 capsule 3  . desvenlafaxine (PRISTIQ) 100 MG 24 hr tablet Take 1 tablet (100 mg total) by mouth every morning. 90 tablet 1  . desvenlafaxine (PRISTIQ) 50 MG 24 hr tablet Take 1 tablet (50 mg total) by mouth every evening. 90 tablet 1  . hydrOXYzine (VISTARIL) 50 MG capsule Take 1 capsule by mouth three times daily as needed 90 capsule 0  . lurasidone (LATUDA) 80 MG TABS tablet Take 1 tablet (80 mg total) by mouth daily with breakfast. 90 tablet 0  . Oxcarbazepine (TRILEPTAL) 300 MG tablet Take 1 tablet (300 mg total) by mouth 2 (two) times daily. 180 tablet 0  . propranolol (INDERAL) 20 MG tablet Take 1 tablet (20 mg total) by mouth 3 (three) times daily as needed. 270 tablet 1   No current facility-administered medications for this visit.    Medication Side Effects: None  Allergies: No Known Allergies  Past Medical History:  Diagnosis Date  . Anxiety   . Depression   . Diabetes mellitus without complication ([place])   . Gastric ulcer   . GERD (gastroesophageal reflux disease)   . Hypertension   . Osteoarthritis   . Thyroid disorder     Past Medical History, Surgical history, Social history, and Family history were reviewed and updated as appropriate.   Please see review of systems for further details on the patient's review from today.   Objective:   Physical Exam:  Wt 260 lb (117.9 kg)   BMI 40.12 kg/m   Physical Exam Constitutional:      General: She is not in acute distress. Musculoskeletal:        General: No deformity.  Neurological:     Mental Status: She is alert and oriented to person, place, and time.     Coordination: Coordination normal.  Psychiatric:        Attention and Perception: Attention and perception normal. She does not perceive auditory or visual hallucinations.        Mood and Affect: Mood is anxious. Mood is not depressed. Affect is not labile, blunt, angry or inappropriate.        Speech: Speech normal.        Behavior: Behavior  normal.        Thought Content: Thought content normal. Thought content is not paranoid or delusional. Thought content does not include homicidal or suicidal ideation. Thought content does not include homicidal or suicidal plan.        Cognition and Memory: Cognition and memory normal.        Judgment: Judgment normal.     Comments: Insight intact     Lab Review:     Component Value Date/Time   NA 138 07/12/2020 1526   NA 139 07/10/2019 0953   NA 142 12/03/2011 1059   K 4.1 07/12/2020 1526   K 3.4 (L) 12/03/2011 1059   CL 103 07/12/2020 1526   CL 107 12/03/2011 1059   CO2 25 07/12/2020 1526   CO2 27 12/03/2011 1059   GLUCOSE 92 07/12/2020 1526   GLUCOSE 66 12/03/2011 1059   BUN  7 07/12/2020 1526   BUN 9 07/10/2019 0953   BUN 4 (L) 12/03/2011 1059   CREATININE 0.97 07/12/2020 1526   CREATININE 0.82 12/03/2011 1059   CALCIUM 8.9 07/12/2020 1526   CALCIUM 8.5 12/03/2011 1059   PROT 7.1 07/12/2020 1526   PROT 6.5 07/10/2019 0953   ALBUMIN 4.2 07/12/2020 1526   ALBUMIN 4.2 07/10/2019 0953   AST 17 07/12/2020 1526   ALT 14 07/12/2020 1526   ALKPHOS 62 07/12/2020 1526   BILITOT 0.2 (L) 07/12/2020 1526   BILITOT <0.2 07/10/2019 0953   GFRNONAA >60 07/12/2020 1526   GFRNONAA >60 12/03/2011 1059   GFRAA 78 07/10/2019 0953   GFRAA >60 12/03/2011 1059       Component Value Date/Time   WBC 7.0 07/12/2020 1526   RBC 4.76 07/12/2020 1526   HGB 14.0 07/12/2020 1526   HGB 13.5 07/10/2019 0953   HCT 42.1 07/12/2020 1526   HCT 41.0 07/10/2019 0953   PLT 342 07/12/2020 1526   PLT 340 07/10/2019 0953   MCV 88.4 07/12/2020 1526   MCV 86 07/10/2019 0953   MCH 29.4 07/12/2020 1526   MCHC 33.3 07/12/2020 1526   RDW 12.8 07/12/2020 1526   RDW 14.0 07/10/2019 0953   LYMPHSABS 2.4 07/12/2020 1526   LYMPHSABS 1.8 12/27/2017 0740   MONOABS 0.6 07/12/2020 1526   EOSABS 0.1 07/12/2020 1526   EOSABS 0.1 12/27/2017 0740   BASOSABS 0.0 07/12/2020 1526   BASOSABS 0.0 12/27/2017 0740     No results found for: POCLITH, LITHIUM   No results found for: PHENYTOIN, PHENOBARB, VALPROATE, CBMZ   .res Assessment: Plan:   Will continue current plan of care since target signs and symptoms are well controlled without any tolerability issues. Recommend continuing therapy with [name], LCSW.  Pt to follow-up with this provider in 1-2 months or sooner if clinically indicated.  Patient advised to contact office with any questions, adverse effects, or acute worsening in signs and symptoms.  Ashley Stephens was seen today for anxiety and follow-up.  Diagnoses and all orders for this visit:  Generalized anxiety disorder -     desvenlafaxine (PRISTIQ) 100 MG 24 hr tablet; Take 1 tablet (100 mg total) by mouth every morning. -     desvenlafaxine (PRISTIQ) 50 MG 24 hr tablet; Take 1 tablet (50 mg total) by mouth every evening. -     Oxcarbazepine (TRILEPTAL) 300 MG tablet; Take 1 tablet (300 mg total) by mouth 2 (two) times daily. -     propranolol (INDERAL) 20 MG tablet; Take 1 tablet (20 mg total) by mouth 3 (three) times daily as needed.  Mood disorder (HCC) -     desvenlafaxine (PRISTIQ) 100 MG 24 hr tablet; Take 1 tablet (100 mg total) by mouth every morning. -     desvenlafaxine (PRISTIQ) 50 MG 24 hr tablet; Take 1 tablet (50 mg total) by mouth every evening. -     Oxcarbazepine (TRILEPTAL) 300 MG tablet; Take 1 tablet (300 mg total) by mouth 2 (two) times daily. -     lurasidone (LATUDA) 80 MG TABS tablet; Take 1 tablet (80 mg total) by mouth daily with breakfast.  Primary insomnia -     hydrOXYzine (VISTARIL) 50 MG capsule; Take 1 capsule by mouth three times daily as needed -     Oxcarbazepine (TRILEPTAL) 300 MG tablet; Take 1 tablet (300 mg total) by mouth 2 (two) times daily.     Please see After Visit Summary for patient specific instructions.  Future Appointments  Date Time Provider [place]  12/30/2020  8:30 AM McDonough, Si Gaul, PA-C [facility] None   01/02/2021  8:00 AM [name], LCSW [facility] None  01/16/2021  8:00 AM [name], LCSW [facility] None  01/26/2021  9:00 AM Wine, Argie Ramming, RN [facility] None  02/06/2021  9:15 AM [name], PMHNP [facility] None    No orders of the defined types were placed in this encounter.   -------------------------------

## 2020-12-30 ENCOUNTER — Encounter: Payer: Self-pay | Admitting: Physician Assistant

## 2020-12-30 ENCOUNTER — Other Ambulatory Visit: Payer: Self-pay | Admitting: Physician Assistant

## 2020-12-30 ENCOUNTER — Ambulatory Visit: Payer: 59 | Admitting: Physician Assistant

## 2020-12-30 ENCOUNTER — Other Ambulatory Visit: Payer: Self-pay

## 2020-12-30 VITALS — BP 116/78 | HR 75 | Temp 96.9°F | Resp 16 | Ht 67.5 in | Wt 260.8 lb

## 2020-12-30 DIAGNOSIS — R5383 Other fatigue: Secondary | ICD-10-CM | POA: Diagnosis not present

## 2020-12-30 DIAGNOSIS — E039 Hypothyroidism, unspecified: Secondary | ICD-10-CM | POA: Diagnosis not present

## 2020-12-30 DIAGNOSIS — Z6841 Body Mass Index (BMI) 40.0 and over, adult: Secondary | ICD-10-CM | POA: Diagnosis not present

## 2020-12-30 DIAGNOSIS — K115 Sialolithiasis: Secondary | ICD-10-CM | POA: Diagnosis not present

## 2020-12-30 DIAGNOSIS — F411 Generalized anxiety disorder: Secondary | ICD-10-CM | POA: Diagnosis not present

## 2020-12-30 DIAGNOSIS — E1165 Type 2 diabetes mellitus with hyperglycemia: Secondary | ICD-10-CM | POA: Diagnosis not present

## 2020-12-30 DIAGNOSIS — R69 Illness, unspecified: Secondary | ICD-10-CM | POA: Diagnosis not present

## 2020-12-30 LAB — POCT GLYCOSYLATED HEMOGLOBIN (HGB A1C): Hemoglobin A1C: 5.5 % (ref 4.0–5.6)

## 2020-12-30 NOTE — Progress Notes (Signed)
[facility] [place], [place] [address]  Internal MEDICINE  Office Visit Note  Patient Name: Ashley Stephens  [id number]  [id number]  Date of Service: 01/04/2021  Chief Complaint  Patient presents with  . Follow-up    Pt went to get labs done in [place] in the middle of march and was told by [facility] they didn't have any orders, pt did not call the office  . Anxiety  . Depression  . Diabetes  . Gastroesophageal Reflux  . Hypertension  . Quality Metric Gaps    Eye exam    HPI Pt is here for routine follow up -She tried to have her labs done, but when she went to [facility] they told they did not see any orders--new orders placed with lab order copy given -She is followed by behavioral health for anxiety -Her BG at home fasting reading 841, does trulicity -she did have her colonoscopy and mammogram done, told to repeat colonscopy in 3 years -At the dentist took xray and found something near saliva gland/ carotid--CT at ENT and found it was a stone in saliva gland and has to decide about surgery or not. (Dr. Markus Daft at [facility])  Current Medication: Outpatient Encounter Medications as of 12/30/2020  Medication Sig  . ALPRAZolam (XANAX) 0.25 MG tablet Take 1 tablet (0.25 mg total) by mouth 2 (two) times daily as needed. for anxiety  . cholecalciferol (VITAMIN D3) 25 MCG (1000 UNIT) tablet Take 1,000 Units by mouth daily.  . Cyanocobalamin (VITAMIN B 12 PO) Take by mouth.  . desvenlafaxine (PRISTIQ) 100 MG 24 hr tablet Take 1 tablet (100 mg total) by mouth every morning.  . desvenlafaxine (PRISTIQ) 50 MG 24 hr tablet Take 1 tablet (50 mg total) by mouth every evening.  . Dulaglutide (TRULICITY) 1.5 YS/0.6TK SOPN Inject 1.5 mg into the skin once a week.  . hydrOXYzine (VISTARIL) 50 MG capsule Take 1 capsule by mouth three times daily as needed  . lamoTRIgine (LAMICTAL) 150 MG tablet Take 1 tablet (150 mg total) by mouth 2 (two) times daily.  Marland Kitchen  levothyroxine (SYNTHROID) 25 MCG tablet Take 1 tablet (25 mcg total) by mouth daily before breakfast.  . lurasidone (LATUDA) 80 MG TABS tablet Take 1 tablet (80 mg total) by mouth daily with breakfast.  . omeprazole (PRILOSEC) 40 MG capsule Take 1 capsule (40 mg total) by mouth daily.  . Oxcarbazepine (TRILEPTAL) 300 MG tablet Take 1 tablet (300 mg total) by mouth 2 (two) times daily.  . propranolol (INDERAL) 20 MG tablet Take 1 tablet (20 mg total) by mouth 3 (three) times daily as needed.   No facility-administered encounter medications on file as of 12/30/2020.    Surgical History: Past Surgical History:  Procedure Laterality Date  . ABDOMINAL HYSTERECTOMY  2013  . BARIATRIC SURGERY    . BREAST BIOPSY  1995  . COLONOSCOPY WITH PROPOFOL N/A 10/28/2020   Procedure: COLONOSCOPY WITH PROPOFOL;  Surgeon: Jonathon Bellows, MD;  Location: [facility] ENDOSCOPY;  Service: Gastroenterology;  Laterality: N/A;  . GASTRIC BYPASS  2012  . TUBAL LIGATION  1996  . tummy tuck  05/27/2020    Medical History: Past Medical History:  Diagnosis Date  . Anxiety   . Depression   . Diabetes mellitus without complication ([place])   . Gastric ulcer   . GERD (gastroesophageal reflux disease)   . Hypertension   . Osteoarthritis   . Thyroid disorder     Family History: Family History  Problem Relation Age of Onset  . Breast cancer Maternal Grandmother   . Diabetes Maternal Grandmother   . Heart disease Maternal Grandmother   . Hypertension Maternal Grandmother   . Heart disease Brother   . Hypertension Brother   . Hyperlipidemia Brother   . Diabetes Maternal Aunt   . Heart disease Maternal Aunt   . Hypertension Maternal Aunt   . Hyperlipidemia Maternal Aunt   . Diabetes Maternal Aunt   . Heart disease Maternal Aunt   . Hypertension Maternal Aunt   . Hyperlipidemia Maternal Aunt   . Diabetes Maternal Aunt   . Heart disease Maternal Aunt   . Hypertension Maternal Aunt   . Hyperlipidemia Maternal Aunt      Social History   Socioeconomic History  . Marital status: Single    Spouse name: Not on file  . Number of children: 1  . Years of education: Not on file  . Highest education level: Not on file  Occupational History  . Occupation: Engineer, drilling  Tobacco Use  . Smoking status: Never Smoker  . Smokeless tobacco: Never Used  Vaping Use  . Vaping Use: Never used  Substance and Sexual Activity  . Alcohol use: No    Alcohol/week: 0.0 standard drinks  . Drug use: No  . Sexual activity: Yes    Birth control/protection: Surgical  Other Topics Concern  . Not on file  Social History Narrative  . Not on file   Social Determinants of Health   Financial Resource Strain: Not on file  Food Insecurity: No Food Insecurity  . Worried About Charity fundraiser in the Last Year: Never true  . Ran Out of Food in the Last Year: Never true  Transportation Needs: No Transportation Needs  . Lack of Transportation (Medical): No  . Lack of Transportation (Non-Medical): No  Physical Activity: Not on file  Stress: Not on file  Social Connections: Not on file  Intimate Partner Violence: Not on file      Review of Systems  Constitutional: Negative for chills, fatigue and unexpected weight change.  HENT: Negative for congestion, postnasal drip, rhinorrhea, sneezing and sore throat.        Stone in salivary gland  Eyes: Negative for redness.  Respiratory: Negative for cough, chest tightness and shortness of breath.   Cardiovascular: Negative for chest pain and palpitations.  Gastrointestinal: Negative for abdominal pain, constipation, diarrhea, nausea and vomiting.  Genitourinary: Negative for dysuria and frequency.  Musculoskeletal: Negative for arthralgias, back pain, joint swelling and neck pain.  Skin: Negative for rash.  Neurological: Negative.  Negative for tremors and numbness.  Hematological: Negative for adenopathy. Does not bruise/bleed easily.  Psychiatric/Behavioral:  Negative for behavioral problems (Depression), sleep disturbance and suicidal ideas. The patient is nervous/anxious.     Vital Signs: BP 116/78   Pulse 75   Temp (!) 96.9 F (36.1 C)   Resp 16   Ht 5' 7.5" (1.715 m)   Wt 260 lb 12.8 oz (118.3 kg)   SpO2 98%   BMI 40.24 kg/m    Physical Exam Vitals and nursing note reviewed.  Constitutional:      General: She is not in acute distress.    Appearance: She is well-developed. She is obese. She is not diaphoretic.  HENT:     Head: Normocephalic and atraumatic.     Mouth/Throat:     Pharynx: No oropharyngeal exudate.  Eyes:     Pupils: Pupils are equal, round, and reactive to  light.  Neck:     Thyroid: No thyromegaly.     Vascular: No JVD.     Trachea: No tracheal deviation.  Cardiovascular:     Rate and Rhythm: Normal rate and regular rhythm.     Heart sounds: Normal heart sounds. No murmur heard. No friction rub. No gallop.   Pulmonary:     Effort: Pulmonary effort is normal. No respiratory distress.     Breath sounds: No wheezing or rales.  Chest:     Chest wall: No tenderness.  Abdominal:     General: Bowel sounds are normal.     Palpations: Abdomen is soft.  Musculoskeletal:        General: Normal range of motion.     Cervical back: Normal range of motion and neck supple.  Lymphadenopathy:     Cervical: No cervical adenopathy.  Skin:    General: Skin is warm and dry.  Neurological:     Mental Status: She is alert and oriented to person, place, and time.     Cranial Nerves: No cranial nerve deficit.  Psychiatric:        Behavior: Behavior normal.        Thought Content: Thought content normal.        Judgment: Judgment normal.        Assessment/Plan: 1. Type 2 diabetes mellitus with hyperglycemia, without long-term current use of insulin (HCC) - POCT HgB A1C is 5.5. Continue trulicity and improve diet and exercise  2. Acquired hypothyroidism Continue synthroid, will reorder labs and adjust as  indicated  3. Generalized anxiety disorder Stable, Followed by psych  4. Salivary gland stone Followed by ENT  5. Other fatigue - CBC w/Diff/Platelet - Comprehensive metabolic panel - Lipid Panel With LDL/HDL Ratio - TSH + free T4 - Vitamin D (25 hydroxy) - B12 and Folate Panel  6. Morbid obesity with BMI of 40.0-44.9, adult ([place]) Continue trulicity for BG control and wt loss. Obesity Counseling: Had a lengthy discussion regarding patients BMI and weight issues. Patient was instructed on portion control as well as increased activity. Also discussed caloric restrictions with trying to maintain intake less than 2000 Kcal. Discussions were made in accordance with the 5As of weight management. Simple actions such as not eating late and if able to, taking a walk is suggested.    General Counseling: Hazelene verbalizes understanding of the findings of todays visit and agrees with plan of treatment. I have discussed any further diagnostic evaluation that may be needed or ordered today. We also reviewed her medications today. she has been encouraged to call the office with any questions or concerns that should arise related to todays visit.    Orders Placed This Encounter  Procedures  . CBC w/Diff/Platelet  . Comprehensive metabolic panel  . Lipid Panel With LDL/HDL Ratio  . TSH + free T4  . Vitamin D (25 hydroxy)  . B12 and Folate Panel  . POCT HgB A1C    No orders of the defined types were placed in this encounter.   This patient was seen by Drema Dallas, PA-C in collaboration with Dr. Clayborn Bigness as a part of collaborative care agreement.   Total time spent:30 Minutes Time spent includes review of chart, medications, test results, and follow up plan with the patient.      Dr Lavera Guise Internal medicine

## 2020-12-31 LAB — COMPREHENSIVE METABOLIC PANEL
ALT: 10 IU/L (ref 0–32)
AST: 17 IU/L (ref 0–40)
Albumin/Globulin Ratio: 2 (ref 1.2–2.2)
Albumin: 4.3 g/dL (ref 3.8–4.8)
Alkaline Phosphatase: 79 IU/L (ref 44–121)
BUN/Creatinine Ratio: 8 — ABNORMAL LOW (ref 9–23)
BUN: 7 mg/dL (ref 6–24)
Bilirubin Total: 0.3 mg/dL (ref 0.0–1.2)
CO2: 21 mmol/L (ref 20–29)
Calcium: 9.3 mg/dL (ref 8.7–10.2)
Chloride: 101 mmol/L (ref 96–106)
Creatinine, Ser: 0.84 mg/dL (ref 0.57–1.00)
Globulin, Total: 2.1 g/dL (ref 1.5–4.5)
Glucose: 88 mg/dL (ref 65–99)
Potassium: 4.6 mmol/L (ref 3.5–5.2)
Sodium: 139 mmol/L (ref 134–144)
Total Protein: 6.4 g/dL (ref 6.0–8.5)
eGFR: 86 mL/min/{1.73_m2} (ref >59)

## 2020-12-31 LAB — CBC WITH DIFFERENTIAL/PLATELET
Basophils Absolute: 0 10*3/uL (ref 0.0–0.2)
Basos: 1 %
EOS (ABSOLUTE): 0.1 10*3/uL (ref 0.0–0.4)
Eos: 2 %
Hematocrit: 40.8 % (ref 34.0–46.6)
Hemoglobin: 14 g/dL (ref 11.1–15.9)
Immature Grans (Abs): 0 10*3/uL (ref 0.0–0.1)
Immature Granulocytes: 0 %
Lymphocytes Absolute: 1.4 10*3/uL (ref 0.7–3.1)
Lymphs: 28 %
MCH: 29.9 pg (ref 26.6–33.0)
MCHC: 34.3 g/dL (ref 31.5–35.7)
MCV: 87 fL (ref 79–97)
Monocytes Absolute: 0.5 10*3/uL (ref 0.1–0.9)
Monocytes: 10 %
Neutrophils Absolute: 2.9 10*3/uL (ref 1.4–7.0)
Neutrophils: 59 %
Platelets: 311 10*3/uL (ref 150–450)
RBC: 4.69 x10E6/uL (ref 3.77–5.28)
RDW: 12.9 % (ref 11.7–15.4)
WBC: 4.8 10*3/uL (ref 3.4–10.8)

## 2020-12-31 LAB — TSH+FREE T4
Free T4: 0.92 ng/dL (ref 0.82–1.77)
TSH: 3.07 u[IU]/mL (ref 0.450–4.500)

## 2020-12-31 LAB — LIPID PANEL WITH LDL/HDL RATIO
Cholesterol, Total: 207 mg/dL — ABNORMAL HIGH (ref 100–199)
HDL: 50 mg/dL (ref >39)
LDL Chol Calc (NIH): 133 mg/dL — ABNORMAL HIGH (ref 0–99)
LDL/HDL Ratio: 2.7 ratio (ref 0.0–3.2)
Triglycerides: 132 mg/dL (ref 0–149)
VLDL Cholesterol Cal: 24 mg/dL (ref 5–40)

## 2020-12-31 LAB — VITAMIN D 25 HYDROXY (VIT D DEFICIENCY, FRACTURES): Vit D, 25-Hydroxy: 29 ng/mL — ABNORMAL LOW (ref 30.0–100.0)

## 2020-12-31 LAB — B12 AND FOLATE PANEL
Folate: 15.7 ng/mL (ref >3.0)
Vitamin B-12: >2000 pg/mL — ABNORMAL HIGH (ref 232–1245)

## 2021-01-02 ENCOUNTER — Other Ambulatory Visit: Payer: Self-pay

## 2021-01-02 ENCOUNTER — Ambulatory Visit (INDEPENDENT_AMBULATORY_CARE_PROVIDER_SITE_OTHER): Payer: 59 | Admitting: Psychiatry

## 2021-01-02 DIAGNOSIS — F411 Generalized anxiety disorder: Secondary | ICD-10-CM | POA: Diagnosis not present

## 2021-01-02 DIAGNOSIS — R69 Illness, unspecified: Secondary | ICD-10-CM | POA: Diagnosis not present

## 2021-01-02 NOTE — Progress Notes (Signed)
[facility] Counselor/Therapist Progress Note  Patient ID: Ashley Stephens, MRN: [id number],    Date: 01/02/2021  Time Spent: 60 minutes   8:00am to 9:00am  Treatment Type: Individual Therapy  Reported Symptoms: Anxiety, frustration, depression  Mental Status Exam:  Appearance:   Neat     Behavior:  Appropriate, Sharing and Motivated  Motor:  Normal  Speech/Language:   Clear and Coherent  Affect:  anxious  Mood:  anxious and depressed  Thought process:  goal directed  Thought content:    WNL  Sensory/Perceptual disturbances:    WNL  Orientation:  oriented to person, place, time/date, situation, day of week, month of year and year  Attention:  Fair  Concentration:  Fair  Memory:  WNL  Fund of knowledge:   Good  Insight:    Fair  Judgment:   Fair  Impulse Control:  Good   Risk Assessment: Danger to Self:  No Self-injurious Behavior: No Danger to Others: No Duty to Warn:no Physical Aggression / Violence:No  Access to Firearms a concern: No  Gang Involvement:No   Subjective: Patient today reports anxiety, frustration, and depression as symptoms currently. She states "I've not managed stressors to the best of my ability, I let my emotions get a hold of me and realize I could have handled things differently "after the fact".  See Progress Note below.   Interventions: Solution-Oriented/Positive Psychology and Ego-Supportive  Diagnosis:   ICD-10-CM   1. Generalized anxiety disorder  F41.1      Plan: Patient not signing tx plan on computer screen due to [place].  Treatment Goals: Goals remain on treatment plan as patient works with strategies to achieve her goals. Progress is noted each session and documented in "Progress" section of Note.   Long term goal: Develop the ability to recognize, accept, and cope with feelings of depression and anxiety.  Short term goal: Learn and implement personal skills for managing stress, solving daily problems, and  resolving conflicts effectively.  Strategy: Teach patient calming strategies, problem solving skills, and conflict resolution skills to better manage daily stressors.  Progress: Patient in today reporting anxiety, frustration, and depression.  Realizing she acts "in the moment" and doesn't regret behaviors but does understand she could have handled some situations better. Self-talk can be quite counterproductive for patient and we worked in session today on her being able to interrupt her self-negating, using specific examples.  Strong tendencies to assume the negative as she begins new job today with same company.  Patient practiced some more positive self-talk, and realizing how assuming the negative is not helpful to her, and feeds her depression and frustration. Discussed ways to work on changing her thought patterns to be more reality-based, encouraging, and empowering. Struggles with feeling positive about self. Impatience increased some.  Today is first day on new job and that has escalated her anxiety and self-doubt.  Using her short term goal and strategy in tx plan above, assisted patient in recognizing self-destructive thinking and behaviors, and focused on more productive ways of interacting with others and in viewing herself. Reviewed communication skills and the importance of looking for positives daily.  Encouraged patient to interrupt her patterns of looking for negatives and what might go wrong in situations and understand the power those patterns have had for her, stay in the present focusing on what she can control, practicing increased positive self talk, remain in contact with people who are supportive of her, practice self calming strategies as discussed  and role played in session, take breaks as she is able during the day, and feel good about the strength she demonstrates and trying to reduce her negativity and let go of some past hurts in order to better manage stress in difficult  situations in the present in order to move forward in a more positive direction.  Goal review and progress/challenges noted with patient.  Next appointment within 2 to 3 weeks.   [name], LCSW

## 2021-01-10 ENCOUNTER — Telehealth: Payer: Self-pay | Admitting: Psychiatry

## 2021-01-10 NOTE — Telephone Encounter (Signed)
Received fax from [employer] to complete FMLA  form for Ashley Stephens. Placed form on Traci's desk.

## 2021-01-13 ENCOUNTER — Telehealth: Payer: Self-pay | Admitting: Psychiatry

## 2021-01-13 NOTE — Telephone Encounter (Signed)
Received fax from [employer] regarding Ashley Stephens. Need completion of FMLA form. Placed on Traci's desk.

## 2021-01-16 ENCOUNTER — Ambulatory Visit (INDEPENDENT_AMBULATORY_CARE_PROVIDER_SITE_OTHER): Payer: 59 | Admitting: Psychiatry

## 2021-01-16 ENCOUNTER — Other Ambulatory Visit: Payer: Self-pay

## 2021-01-16 ENCOUNTER — Telehealth: Payer: Self-pay | Admitting: Psychiatry

## 2021-01-16 DIAGNOSIS — R69 Illness, unspecified: Secondary | ICD-10-CM | POA: Diagnosis not present

## 2021-01-16 DIAGNOSIS — F411 Generalized anxiety disorder: Secondary | ICD-10-CM | POA: Diagnosis not present

## 2021-01-16 NOTE — Telephone Encounter (Signed)
Noted yes paper work received. Will be completed today and given to [facility] in the morning to review and sign.
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
## 2021-01-16 NOTE — Telephone Encounter (Signed)
Pt had an appointment today and wanted to check and see if her FMLA papaerwork has been received. Please give her a call. She has 15 days to completre it. [phone]

## 2021-01-18 DIAGNOSIS — L700 Acne vulgaris: Secondary | ICD-10-CM | POA: Diagnosis not present

## 2021-01-18 DIAGNOSIS — Z0289 Encounter for other administrative examinations: Secondary | ICD-10-CM

## 2021-01-19 DIAGNOSIS — B0089 Other herpesviral infection: Secondary | ICD-10-CM | POA: Diagnosis not present

## 2021-01-26 ENCOUNTER — Other Ambulatory Visit: Payer: Self-pay | Admitting: *Deleted

## 2021-01-26 NOTE — Patient Outreach (Signed)
[facility]) Care Management  01/26/2021  Tahira Olivarez 21-Apr-1976 [id number]  Unsuccessful outreach attempt made to patient. RN Health Coach left HIPAA compliant voicemail message along with her contact information.  Plan: RN Health Coach will call patient within the month of June.  [name] RN, BSN [place] [phone] [email or web address]

## 2021-01-31 ENCOUNTER — Other Ambulatory Visit: Payer: Self-pay | Admitting: Family

## 2021-01-31 DIAGNOSIS — F411 Generalized anxiety disorder: Secondary | ICD-10-CM

## 2021-01-31 DIAGNOSIS — F39 Unspecified mood [affective] disorder: Secondary | ICD-10-CM

## 2021-01-31 DIAGNOSIS — R4589 Other symptoms and signs involving emotional state: Secondary | ICD-10-CM

## 2021-02-06 ENCOUNTER — Other Ambulatory Visit: Payer: Self-pay

## 2021-02-06 ENCOUNTER — Ambulatory Visit (INDEPENDENT_AMBULATORY_CARE_PROVIDER_SITE_OTHER): Payer: 59 | Admitting: Psychiatry

## 2021-02-06 ENCOUNTER — Ambulatory Visit: Payer: 59 | Admitting: Psychiatry

## 2021-02-06 DIAGNOSIS — F411 Generalized anxiety disorder: Secondary | ICD-10-CM | POA: Diagnosis not present

## 2021-02-06 DIAGNOSIS — R69 Illness, unspecified: Secondary | ICD-10-CM | POA: Diagnosis not present

## 2021-02-06 NOTE — Progress Notes (Signed)
[facility] Counselor/Therapist Progress Note  Patient ID: Ashley Stephens, MRN: [id number],    Date: 02/06/2021  Time Spent: 60 minutes   Treatment Type: Individual Therapy  Reported Symptoms: anxiety  Mental Status Exam:  Appearance:   Casual     Behavior:  Appropriate and Sharing  Motor:  Normal  Speech/Language:   Clear and Coherent  Affect:  anxious, depressed  Mood:  anxious and depressed  Thought process:  goal directed  Thought content:    some obsessiveness  Sensory/Perceptual disturbances:    WNL  Orientation:  oriented to person, place, time/date, situation, day of week, month of year and year  Attention:  Fair  Concentration:  Fair  Memory:  WNL  Fund of knowledge:   Good  Insight:    Good  Judgment:   Good  Impulse Control:  Good   Risk Assessment: Danger to Self:  No Self-injurious Behavior: No Danger to Others: No Duty to Warn:no Physical Aggression / Violence:No  Access to Firearms a concern: No  Gang Involvement:No     Subjective:  Patient in today reporting anxiety and depression re: work and personal issues, which patient reports has worsened more recently due to job changes and upcoming move with her residence. 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 Not happy with supervisor. "Frustrated having to get up by 6:30 am to be ready for work by [employer]." States there's nothing at newer job that she likes and is looking for something else. Issues with adult son and daughter and am in process of setting boundaries with them.Worked more today on her thoughts/feelings/attitudes/anger re: new job and what decisions she can make to better manage her anxious/negative/angry feelings and hopefully have better outcomes. Not sure what she wants to do now and we worked on what she could do most immediately to try  and make the best of her current situation rather than being impulsive in her decisions. Denies any SI. Looked at some options that she had reported earlier in session that might help and added a few more---all of which are things patient agreed to try in order to get helpful info and feel more settled in her job, plus get to know another co-worker better, and build some strength, patience, belief in herself,  and resilience in the  process.     Interventions: Solution-Oriented/Positive Psychology, Ego-Supportive and Insight-Oriented  Diagnosis:   ICD-10-CM   1. Generalized anxiety disorder  F41.1      Treatment Goal Plan:  Patient not signing tx plan on computer screen due to [place]. Treatment Goals: Goals remain on treatment plan as patient works with strategies to achieve her goals. Progress is noted each session and documented in "Progress" section of Note.  Long term goal: Develop the ability to recognize, accept, and cope with feelings of depression and anxiety. Short term goal: Learn and implement personal skills for managing stress, solving daily problems, and resolving conflicts effectively. Strategy: Teach patient calming strategies, problem solving skills, and conflict resolution skills to better manage daily stressors.   Progress / Plan: Patient today struggling with her new job and being dissatisfied with multiple parts of it.  Difficult for her not to focus on the negatives but after a while she was able to connect and work with therapist on reframing some of  her situation, looking at the present and what can be changed and what cannot and where she might help instigate some change in a positive way, including stopping the negative self talk and agreeing to make contact with 1 new coworker that she feels potentially comfortable in reaching out to and she adds that that coworker seems to be fairly knowledgeable about the work in their unit.  She commits to doing the best job she  can at this point rather than impulsively trying to make a change, and adds that the work is not hard but there are some learning curves for her as far as certain information.  The more she talked she seemed to believe in herself a little more and see at least short-term challenges where she might could make a difference and potentially see things a bit differently.  Did work hard on this in session and hopefully can hold onto it as patient struggles to have healthy support from positive people.  Encouraged patient to follow through on steps we discussed today, to stop self negating, to practice more consistently positive self-care and positive self talk daily, to get outside daily, realize how assuming the negatives always tends to feed her anxiety and depression, to be willing to put into action some of the more productive ways of interacting with others per our discussion today, work on improving the way she views herself, practice more positive communication skills discussed in session, practice self calming strategies discussed in session, take small breaks whenever she can, be mindful of her self talk when it goes in a negative direction and try to interrupt it bringing in turn it more towards a positive/encouraging direction as noted in session, practice looking for what might go right, stay in the present focusing on what she can control or change, stay in contact with people who are supportive of her even if that number is small, continue work on letting go of past hurts that can bubble up when things are tough currently, and feel good about the strength she shows in working with goal-directed behaviors in the midst of challenging and often changing circumstances as she seeks to move forward in a more positive direction towards improved coping skills and overall emotional health.  Goal review and progress/challenges noted with patient.  Next appointment within 2 weeks.   [name],  LCSW

## 2021-02-14 ENCOUNTER — Other Ambulatory Visit: Payer: Self-pay | Admitting: *Deleted

## 2021-02-14 ENCOUNTER — Ambulatory Visit: Payer: 59 | Admitting: Psychiatry

## 2021-02-14 NOTE — Patient Outreach (Signed)
[facility]) Care Management  02/14/2021  Ashley Stephens [birth year] [id number]  Unsuccessful outreach attempt made to patient. RN Health Coach left HIPAA compliant voicemail message along with her contact information.  Plan: RN Health Coach will call patient within the month of July and will send the patient an unsuccessful letter.  [name] RN, BSN [place] [phone] [name]

## 2021-02-21 ENCOUNTER — Other Ambulatory Visit: Payer: Self-pay

## 2021-02-21 ENCOUNTER — Ambulatory Visit (INDEPENDENT_AMBULATORY_CARE_PROVIDER_SITE_OTHER): Payer: 59 | Admitting: Psychiatry

## 2021-02-21 DIAGNOSIS — R69 Illness, unspecified: Secondary | ICD-10-CM | POA: Diagnosis not present

## 2021-02-21 DIAGNOSIS — F411 Generalized anxiety disorder: Secondary | ICD-10-CM

## 2021-02-21 NOTE — Progress Notes (Signed)
[facility] Counselor/Therapist Progress Note  Patient ID: Ashley Stephens, MRN: [id number],    Date: 02/21/2021  Time Spent: 50 minutes  Treatment Type: Individual Therapy  Reported Symptoms: anxiety, depression "I would say 80% anxiety and 20% depression".  Mental Status Exam:  Appearance:   Casual     Behavior:  Appropriate, Sharing, and "somewhat motivated"  Motor:  Normal  Speech/Language:   Clear and Coherent  Affect:  Anxious, depressed  Mood:  anxious and depressed  Thought process:  goal directed  Thought content:    Some obsessiveness  Sensory/Perceptual disturbances:    WNL  Orientation:  oriented to person, place, time/date, situation, day of week, month of year, and year  Attention:  Good  Concentration:  Good and Fair  Memory:  WNL  Fund of knowledge:   Good  Insight:    Good and Fair  Judgment:   Good  Impulse Control:  Good and Fair   Risk Assessment: Danger to Self:  No Self-injurious Behavior: No Danger to Others: No Duty to Warn:no Physical Aggression / Violence:No  Access to Firearms a concern: No  Gang Involvement:No   Subjective:  Patient in today reporting anxiety, depression, and has made a  recent transition to move in with a friend of hers who owns a home in [place], and it's going ok so far.  "The move was horrendous but the outcome is good." Good to move out to be more on her own rather than living in apt with adult daughter. Adult daughter moved into her own 1 bdrm apt. Another aunt critically ill with heart and kidneys failing and processed some sadness about aunt.  States she's not letting job worry her as much more recently. Still having difficulty getting up at 6:30 to start work by 8:00 (remotely). States she hasn't found anything about her job she likes but is tolerating it a bit better . Difficulties still occur with adult son and adult daughter but feels her relationship with daughter may likely get better now that they're  living apart. Reports feeling some increased hope for the future and acknowledges this is a recently emerging feeling for patient that hopefully she can hold onto. Reports more recently she has been better with co-workers, and her work with them is mostly remote.  States there are still things from her past that she hasn't shared and hasnt' worked on that keep her from moving forward now. I requested she make a list of those things and bring to next session and patient agreed.     Interventions: Cognitive Behavioral Therapy and Solution-Oriented/Positive Psychology  Diagnosis:   ICD-10-CM   1. Generalized anxiety disorder  F41.1       Treatment Goal Plan:  Patient not signing tx plan on computer screen due to [place].  Treatment Goals: Goals remain on treatment plan as patient works with strategies to achieve her goals.  Progress is noted each session and documented in "Progress" section of Note. Long term goal: Develop the ability to recognize, accept, and cope with feelings of depression and anxiety. Short term goal: Learn and implement personal skills for managing stress, solving daily problems, and resolving conflicts effectively. Strategy: Teach patient calming strategies, problem solving skills, and conflict resolution skills to better manage daily stressors.    Plan:  Patient today showing some improved motivation today in session as we talked more about her job and personal stressors.  As noted above she is trying adjust more to her new  shift at work but is not getting very involved with other people at this point.  Not as angry today and actually having some positives to report especially in regards to her moving in with a person she knows from work who has her own home.  Patient moved in within the past week after having lived several years with her adult daughter.  Adult daughter went on to rent her own apartment.  Patient feels that with the 2 of them living apart, they may end up  being closer as mother/daughter.  Even though she is doing some better at work, she did not follow through in reaching out to 1 coworker per our discussion last session which was okay as she came in with a different mindset and ready to work on other things.  Acknowledged with her how her motivation was good today and she was able to highlight some positives.  Encouraged patient to continue some behaviors that we have found to be helpful for her between sessions including stop self negating, get outside daily and walk some, realize that in assuming the negatives this feeds her anxiety and depression, practice more consistent positive self-care and positive self talk daily, be willing to put into action some of the more productive ways of interacting with others per our discussion last session and again today, practice viewing herself in more positive ways and find things about herself that she actually likes, practice more positive communication skills, take small breaks whenever she can, be mindful of her self talk and attitude when it goes in a negative direction and try to interrupt it bringing it towards a more positive/encouraging direction as discussed in session, practice self calming strategies as discussed in session previously, look more for what might go right versus wrong, stay in contact with people who are supportive, remain in the present focusing on what she can control, continue to work on past hurts that can bubble up during difficult times, and to realize the strength encouraged she shows and working with goal-directed behaviors in the midst of challenging life circumstances as she tries to move forward in a more positive direction.  Goal review and progress/challenges noted with patient.  Next appointment within 2 weeks.   [name], LCSW

## 2021-03-02 ENCOUNTER — Encounter: Payer: Self-pay | Admitting: Psychiatry

## 2021-03-02 ENCOUNTER — Other Ambulatory Visit: Payer: Self-pay

## 2021-03-02 ENCOUNTER — Ambulatory Visit (INDEPENDENT_AMBULATORY_CARE_PROVIDER_SITE_OTHER): Payer: 59 | Admitting: Psychiatry

## 2021-03-02 DIAGNOSIS — F5101 Primary insomnia: Secondary | ICD-10-CM

## 2021-03-02 DIAGNOSIS — R69 Illness, unspecified: Secondary | ICD-10-CM | POA: Diagnosis not present

## 2021-03-02 DIAGNOSIS — F411 Generalized anxiety disorder: Secondary | ICD-10-CM

## 2021-03-02 DIAGNOSIS — F39 Unspecified mood [affective] disorder: Secondary | ICD-10-CM | POA: Diagnosis not present

## 2021-03-02 MED ORDER — OXCARBAZEPINE 300 MG PO TABS: 300.0000 mg | ORAL_TABLET | Freq: Two times a day (BID) | ORAL | 0 refills | Status: DC

## 2021-03-02 MED ORDER — LURASIDONE HCL 80 MG PO TABS: 80.0000 mg | ORAL_TABLET | Freq: Every day | ORAL | 0 refills | Status: DC

## 2021-03-02 MED ORDER — HYDROXYZINE PAMOATE 50 MG PO CAPS
ORAL_CAPSULE | ORAL | 1 refills | Status: DC
Start: 2021-03-02 — End: 2021-05-15

## 2021-03-02 MED ORDER — ALPRAZOLAM 0.25 MG PO TABS: 0.2500 mg | ORAL_TABLET | Freq: Two times a day (BID) | ORAL | 5 refills | Status: DC | PRN

## 2021-03-02 NOTE — Progress Notes (Signed)
Ashley Stephens [id number] 04-07-1976 45 y.o.  Subjective:   Patient ID:  Ashley Stephens is a 1 y.o. (DOB 07/08/1976) female.  Chief Complaint:  Chief Complaint  Patient presents with   Follow-up    Anxiety, mood disturbance, insomnia    HPI Ashley Stephens presents to the office today for follow-up of mood disturbance, anxiety, and insomnia. She reports that she is adjusting to new work hours of 8-4:30 with "getting up and getting motivated." She reports that her position is a little different. She reports, "I got what I asked for and it's up to me to make it happen." She also recently moved to a new house and now has a dedicated office space. She reports increased mood lability in response to recent changes. Denies elevated moods. She reports feeling "depressed, anxious, tired... no happy moments." She reports frequent irritability. Denies panic attacks. She reports frequent worry. She reports decreased sleep with new work schedule. Appetite has been ok and will fluctuate. Concentration is "all over the place." Reports anhedonia. Denies SI.  Raised by aunt that died in 12-19-18, another aunt died in December 19, 2019, and receives phone call during visit that another aunt just died. Reports that aunt had been sick and in hospice care with heart issues and renal failure. She reports that her mother seems to come to her for support.   Past medication trials: Wellbutrin Effexor Pristiq-effective Lamictal-effective for mood Latuda Xanax- Usually takes at night Propanolol- Effective Hydroxyzine Rexulti Gabapentin- prescribed for back pain. Dizziness, headaches. Lithium Trileptal  AIMS    Flowsheet Row Office Visit from 12/26/2020 in [place] Office Visit from 08/12/2020 in [place] Office Visit from 04/11/2020 in [place] Visit from 11/27/2019 in [place] Visit from 08/14/2019 in [place] Total Score 0 0 0 0 0      [place] Visit from 12/25/2019 in [place]  Total GAD-7 Score 16      PHQ2-9    [place] Office Visit from 12/30/2020 in [facility] Office Visit from 10/03/2020 in [facility] Patient Outreach Telephone from 08/22/2020 in [place] Visit from 05/24/2020 in [place], [facility] Office Visit from 04/05/2020 in [facility], [facility]  PHQ-2 Total Score 0 3 2 0 0  PHQ-9 Total Score -- -- 4 -- --      Flowsheet Row Admission (Discharged) from 10/28/2020 in [place] No Risk        Review of Systems:  Review of Systems  Gastrointestinal: Negative.   Musculoskeletal:  Negative for gait problem.  Neurological:  Negative for tremors.  Psychiatric/Behavioral:         Please refer to HPI   Medications: I have reviewed the patient's current medications.  Current Outpatient Medications  Medication Sig Dispense Refill   cholecalciferol (VITAMIN D3) 25 MCG (1000 UNIT) tablet Take 1,000 Units by mouth daily.     Cyanocobalamin (VITAMIN B 12 PO) Take by mouth.     desvenlafaxine (PRISTIQ) 100 MG 24 hr tablet Take 1 tablet (100 mg total) by mouth every morning. 90 tablet 1   desvenlafaxine (PRISTIQ) 50 MG 24 hr tablet Take 1 tablet (50 mg total) by mouth every evening. 90 tablet 1   Dulaglutide (TRULICITY) 1.5 SN/0.5LZ SOPN Inject 1.5 mg into the skin once a week. 3 mL  12   lamoTRIgine (LAMICTAL) 150 MG tablet Take 1 tablet (150 mg total) by mouth 2 (two) times daily. 180 tablet 1   levothyroxine (SYNTHROID) 25 MCG tablet Take 1 tablet (25 mcg total) by mouth daily before breakfast. 90 tablet 2   omeprazole (PRILOSEC) 40 MG capsule Take 1 capsule (40 mg total) by mouth daily. 30 capsule 3   propranolol (INDERAL) 20 MG tablet Take 1 tablet (20 mg  total) by mouth 3 (three) times daily as needed. 270 tablet 1   [START ON 03/14/2021] ALPRAZolam (XANAX) 0.25 MG tablet Take 1 tablet (0.25 mg total) by mouth 2 (two) times daily as needed. for anxiety 45 tablet 5   hydrOXYzine (VISTARIL) 50 MG capsule Take 1 capsule by mouth three times daily as needed 90 capsule 1   lurasidone (LATUDA) 80 MG TABS tablet Take 1 tablet (80 mg total) by mouth daily with breakfast. 90 tablet 0   Oxcarbazepine (TRILEPTAL) 300 MG tablet Take 1 tablet (300 mg total) by mouth 2 (two) times daily. 180 tablet 0   No current facility-administered medications for this visit.    Medication Side Effects: None  Allergies: No Known Allergies  Past Medical History:  Diagnosis Date   Anxiety    Depression    Diabetes mellitus without complication (HCC)    Gastric ulcer    GERD (gastroesophageal reflux disease)    Hypertension    Osteoarthritis    Thyroid disorder     Past Medical History, Surgical history, Social history, and Family history were reviewed and updated as appropriate.   Please see review of systems for further details on the patient's review from today.   Objective:   Physical Exam:  There were no vitals taken for this visit.  Physical Exam Constitutional:      General: She is not in acute distress. Musculoskeletal:        General: No deformity.  Neurological:     Mental Status: She is alert and oriented to person, place, and time.     Coordination: Coordination normal.  Psychiatric:        Attention and Perception: Attention and perception normal. She does not perceive auditory or visual hallucinations.        Mood and Affect: Mood is anxious. Affect is not labile, blunt, angry or inappropriate.        Speech: Speech normal.        Behavior: Behavior normal.        Thought Content: Thought content normal. Thought content is not paranoid or delusional. Thought content does not include homicidal or suicidal ideation. Thought content does  not include homicidal or suicidal plan.        Cognition and Memory: Cognition and memory normal.        Judgment: Judgment normal.     Comments: Insight intact Mildly dysphoric mood    Lab Review:     Component Value Date/Time   NA 139 12/30/2020 0941   NA 142 12/03/2011 1059   K 4.6 12/30/2020 0941   K 3.4 (L) 12/03/2011 1059   CL 101 12/30/2020 0941   CL 107 12/03/2011 1059   CO2 21 12/30/2020 0941   CO2 27 12/03/2011 1059   GLUCOSE 88 12/30/2020 0941   GLUCOSE 92 07/12/2020 1526   GLUCOSE 66 12/03/2011 1059   BUN 7 12/30/2020 0941   BUN 4 (L) 12/03/2011 1059   CREATININE 0.84 12/30/2020 0941   CREATININE 0.82 12/03/2011 1059   CALCIUM 9.3 12/30/2020 0941  CALCIUM 8.5 12/03/2011 1059   PROT 6.4 12/30/2020 0941   ALBUMIN 4.3 12/30/2020 0941   AST 17 12/30/2020 0941   ALT 10 12/30/2020 0941   ALKPHOS 79 12/30/2020 0941   BILITOT 0.3 12/30/2020 0941   GFRNONAA >60 07/12/2020 1526   GFRNONAA >60 12/03/2011 1059   GFRAA 78 07/10/2019 0953   GFRAA >60 12/03/2011 1059       Component Value Date/Time   WBC 4.8 12/30/2020 0941   WBC 7.0 07/12/2020 1526   RBC 4.69 12/30/2020 0941   RBC 4.76 07/12/2020 1526   HGB 14.0 12/30/2020 0941   HCT 40.8 12/30/2020 0941   PLT 311 12/30/2020 0941   MCV 87 12/30/2020 0941   MCH 29.9 12/30/2020 0941   MCH 29.4 07/12/2020 1526   MCHC 34.3 12/30/2020 0941   MCHC 33.3 07/12/2020 1526   RDW 12.9 12/30/2020 0941   LYMPHSABS 1.4 12/30/2020 0941   MONOABS 0.6 07/12/2020 1526   EOSABS 0.1 12/30/2020 0941   BASOSABS 0.0 12/30/2020 0941    No results found for: POCLITH, LITHIUM   No results found for: PHENYTOIN, PHENOBARB, VALPROATE, CBMZ   .res Assessment: Plan:   Pt seen for 30 minutes and time spent counseling pt regarding possible treatment options to improve anxiety and mood lability, to include increasing Trileptal.  Patient reports that she is concerned about potential side effects and would prefer to continue current  medications at this time.  She attributes recent increase in mood and anxiety signs and symptoms to include multiple changes and anticipates the signs and symptoms improving as she acclimates to recent move and job change. Continue Trileptal 300 mg twice daily for mood and anxiety signs and symptoms Continue Xanax 0.25 mg twice daily as needed for anxiety. Continue Lamictal 150 mg twice daily for mood signs and symptoms. Continue Pristiq 100 mg in the morning and 50 mg every evening for mood and anxiety signs and symptoms. Continue Latuda 80 mg daily with a meal for mood stabilization. Continue propranolol as needed for anxiety. Recommend continuing psychotherapy with [name], LCSW. Patient to follow-up with this provider in 6 to 8 weeks or sooner if clinically indicated. Patient advised to contact office with any questions, adverse effects, or acute worsening in signs and symptoms.    Ashley Stephens was seen today for follow-up.  Diagnoses and all orders for this visit:  Generalized anxiety disorder -     ALPRAZolam (XANAX) 0.25 MG tablet; Take 1 tablet (0.25 mg total) by mouth 2 (two) times daily as needed. for anxiety -     Oxcarbazepine (TRILEPTAL) 300 MG tablet; Take 1 tablet (300 mg total) by mouth 2 (two) times daily.  Primary insomnia -     hydrOXYzine (VISTARIL) 50 MG capsule; Take 1 capsule by mouth three times daily as needed -     Oxcarbazepine (TRILEPTAL) 300 MG tablet; Take 1 tablet (300 mg total) by mouth 2 (two) times daily.  Mood disorder (HCC) -     lurasidone (LATUDA) 80 MG TABS tablet; Take 1 tablet (80 mg total) by mouth daily with breakfast. -     Oxcarbazepine (TRILEPTAL) 300 MG tablet; Take 1 tablet (300 mg total) by mouth 2 (two) times daily.    Please see After Visit Summary for patient specific instructions.  Future Appointments  Date Time Provider [place]  03/08/2021  4:00 PM [name], LCSW [facility] None  03/16/2021  9:00 AM Wine, Argie Ramming, RN  [facility] None  03/30/2021  3:40 PM McDonough,  Lauren K, PA-C [facility] None  04/13/2021  3:30 PM [name], PMHNP CP[facility]CP None    No orders of the defined types were placed in this encounter.   -------------------------------

## 2021-03-06 DIAGNOSIS — U071 COVID-19: Secondary | ICD-10-CM | POA: Diagnosis not present

## 2021-03-06 DIAGNOSIS — R0981 Nasal congestion: Secondary | ICD-10-CM | POA: Diagnosis not present

## 2021-03-06 DIAGNOSIS — B9789 Other viral agents as the cause of diseases classified elsewhere: Secondary | ICD-10-CM | POA: Diagnosis not present

## 2021-03-06 DIAGNOSIS — J028 Acute pharyngitis due to other specified organisms: Secondary | ICD-10-CM | POA: Diagnosis not present

## 2021-03-08 ENCOUNTER — Ambulatory Visit: Payer: 59 | Admitting: Psychiatry

## 2021-03-12 DIAGNOSIS — J029 Acute pharyngitis, unspecified: Secondary | ICD-10-CM | POA: Diagnosis not present

## 2021-03-12 DIAGNOSIS — I499 Cardiac arrhythmia, unspecified: Secondary | ICD-10-CM | POA: Diagnosis not present

## 2021-03-12 DIAGNOSIS — K922 Gastrointestinal hemorrhage, unspecified: Secondary | ICD-10-CM | POA: Diagnosis not present

## 2021-03-12 DIAGNOSIS — M549 Dorsalgia, unspecified: Secondary | ICD-10-CM | POA: Diagnosis not present

## 2021-03-12 DIAGNOSIS — T39314A Poisoning by propionic acid derivatives, undetermined, initial encounter: Secondary | ICD-10-CM | POA: Diagnosis not present

## 2021-03-12 DIAGNOSIS — U071 COVID-19: Secondary | ICD-10-CM | POA: Diagnosis not present

## 2021-03-12 DIAGNOSIS — R5383 Other fatigue: Secondary | ICD-10-CM | POA: Diagnosis not present

## 2021-03-12 DIAGNOSIS — R509 Fever, unspecified: Secondary | ICD-10-CM | POA: Diagnosis not present

## 2021-03-12 DIAGNOSIS — F418 Other specified anxiety disorders: Secondary | ICD-10-CM | POA: Insufficient documentation

## 2021-03-12 DIAGNOSIS — R10816 Epigastric abdominal tenderness: Secondary | ICD-10-CM | POA: Diagnosis not present

## 2021-03-12 DIAGNOSIS — M791 Myalgia, unspecified site: Secondary | ICD-10-CM | POA: Diagnosis not present

## 2021-03-12 DIAGNOSIS — R519 Headache, unspecified: Secondary | ICD-10-CM | POA: Diagnosis not present

## 2021-03-12 DIAGNOSIS — R197 Diarrhea, unspecified: Secondary | ICD-10-CM | POA: Diagnosis not present

## 2021-03-12 DIAGNOSIS — F419 Anxiety disorder, unspecified: Secondary | ICD-10-CM | POA: Diagnosis not present

## 2021-03-12 DIAGNOSIS — Z9884 Bariatric surgery status: Secondary | ICD-10-CM | POA: Insufficient documentation

## 2021-03-12 DIAGNOSIS — R059 Cough, unspecified: Secondary | ICD-10-CM | POA: Diagnosis not present

## 2021-03-12 DIAGNOSIS — R69 Illness, unspecified: Secondary | ICD-10-CM | POA: Diagnosis not present

## 2021-03-13 DIAGNOSIS — T39314A Poisoning by propionic acid derivatives, undetermined, initial encounter: Secondary | ICD-10-CM | POA: Diagnosis not present

## 2021-03-13 DIAGNOSIS — K922 Gastrointestinal hemorrhage, unspecified: Secondary | ICD-10-CM | POA: Diagnosis not present

## 2021-03-13 DIAGNOSIS — U071 COVID-19: Secondary | ICD-10-CM | POA: Diagnosis not present

## 2021-03-16 ENCOUNTER — Other Ambulatory Visit: Payer: Self-pay | Admitting: *Deleted

## 2021-03-16 ENCOUNTER — Encounter: Payer: Self-pay | Admitting: Internal Medicine

## 2021-03-16 ENCOUNTER — Telehealth: Payer: 59 | Admitting: Internal Medicine

## 2021-03-16 ENCOUNTER — Other Ambulatory Visit: Payer: Self-pay

## 2021-03-16 VITALS — Ht 67.5 in | Wt 256.0 lb

## 2021-03-16 DIAGNOSIS — K922 Gastrointestinal hemorrhage, unspecified: Secondary | ICD-10-CM

## 2021-03-16 DIAGNOSIS — J208 Acute bronchitis due to other specified organisms: Secondary | ICD-10-CM | POA: Diagnosis not present

## 2021-03-16 DIAGNOSIS — U071 COVID-19: Secondary | ICD-10-CM

## 2021-03-16 NOTE — Patient Outreach (Signed)
[facility]) Care Management  03/16/2021  Mekaylah Klich Dec 02, 1975 [id number]  Unsuccessful outreach attempt made to patient. RN Health Coach left HIPAA compliant voicemail message along with her contact information.  Plan: RN Health Coach will call patient within the month of August.  [name] RN, BSN [place] [phone] [email or web address]

## 2021-03-16 NOTE — Progress Notes (Signed)
[facility] [place], [place] [address]  Internal MEDICINE  Telephone Visit  Patient Name: Ashley Stephens  [id number]  [id number]  Date of Service: 03/19/2021  I connected with the patient at 956 by telephone and verified the patients identity using two identifiers.   I discussed the limitations, risks, security and privacy concerns of performing an evaluation and management service by telephone and the availability of in person appointments. I also discussed with the patient that there may be a patient responsible charge related to the service.  The patient expressed understanding and agrees to proceed.    Chief Complaint  Patient presents with   Covid Positive    Sunday at [facility].  Still having fatigue and cough sometimes dry and other yellow mucus.  Symptoms started around 03/02/21 was dx on 03/06/21 positive and again on 03/12/21   work note needed   Telephone Assessment    Virtual visit [phone]   Telephone Screen    HPI Pt was diagnosed with COVID on July 4th.   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 Patient was admitted on July 10 and was discharged on July 11, patient does have a history of gastric bypass about 10 years ago postoperative peptic ulcer with upper GI bleeding who was diagnosed recently with COVID in urgent care setting she was having a lot of aches and pains admits to taking multiple doses of ibuprofen 600 mg up to 4-5 times a day for 3 to 4 days she noticed some dark maroon stools she decided to go to the emergency room her hemoglobin was stable at 13 patient was discharged home in stable condition on Protonix 40 mg twice a day patient is feeling better now BUT NOT 100% and would like to go to work on July 18  Current Medication: Outpatient Encounter Medications as of 03/16/2021  Medication Sig   ALPRAZolam (XANAX) 0.25 MG tablet  Take 1 tablet (0.25 mg total) by mouth 2 (two) times daily as needed. for anxiety   cholecalciferol (VITAMIN D3) 25 MCG (1000 UNIT) tablet Take 1,000 Units by mouth daily.   Cyanocobalamin (VITAMIN B 12 PO) Take by mouth.   desvenlafaxine (PRISTIQ) 100 MG 24 hr tablet Take 1 tablet (100 mg total) by mouth every morning.   desvenlafaxine (PRISTIQ) 50 MG 24 hr tablet Take 1 tablet (50 mg total) by mouth every evening.   Dulaglutide (TRULICITY) 1.5 PO/2.4MP SOPN Inject 1.5 mg into the skin once a week.   hydrOXYzine (VISTARIL) 50 MG capsule Take 1 capsule by mouth three times daily as needed   lamoTRIgine (LAMICTAL) 150 MG tablet Take 1 tablet (150 mg total) by mouth 2 (two) times daily.   levothyroxine (SYNTHROID) 25 MCG tablet Take 1 tablet (25 mcg total) by mouth daily before breakfast.   lurasidone (LATUDA) 80 MG TABS tablet Take 1 tablet (80 mg total) by mouth daily with breakfast.   omeprazole (PRILOSEC) 40 MG capsule Take 1 capsule (40 mg total) by mouth daily.   Oxcarbazepine (TRILEPTAL) 300 MG tablet Take 1 tablet (300 mg total) by mouth 2 (two) times daily.   propranolol (INDERAL) 20 MG tablet Take 1 tablet (20 mg total) by mouth 3 (three) times daily as needed.   No facility-administered encounter medications on file as of 03/16/2021.    Surgical History: Past Surgical History:  Procedure Laterality Date   ABDOMINAL HYSTERECTOMY  2013  BARIATRIC SURGERY     BREAST BIOPSY  1995   COLONOSCOPY WITH PROPOFOL N/A 10/28/2020   Procedure: COLONOSCOPY WITH PROPOFOL;  Surgeon: Jonathon Bellows, MD;  Location[facility] ENDOSCOPY;  Service: Gastroenterology;  Laterality: N/A;   GASTRIC BYPASS  2012   TUBAL LIGATION  1996   tummy tuck  05/27/2020    Medical History: Past Medical History:  Diagnosis Date   Anxiety    Depression    Diabetes mellitus without complication ([place])    Gastric ulcer    GERD (gastroesophageal reflux disease)    Hypertension    Osteoarthritis    Thyroid disorder      Family History: Family History  Problem Relation Age of Onset   Breast cancer Maternal Grandmother    Diabetes Maternal Grandmother    Heart disease Maternal Grandmother    Hypertension Maternal Grandmother    Heart disease Brother    Hypertension Brother    Hyperlipidemia Brother    Diabetes Maternal Aunt    Heart disease Maternal Aunt    Hypertension Maternal Aunt    Hyperlipidemia Maternal Aunt    Diabetes Maternal Aunt    Heart disease Maternal Aunt    Hypertension Maternal Aunt    Hyperlipidemia Maternal Aunt    Diabetes Maternal Aunt    Heart disease Maternal Aunt    Hypertension Maternal Aunt    Hyperlipidemia Maternal Aunt     Social History   Socioeconomic History   Marital status: Single    Spouse name: Not on file   Number of children: 1   Years of education: Not on file   Highest education level: Not on file  Occupational History   Occupation: Engineer, drilling  Tobacco Use   Smoking status: Never   Smokeless tobacco: Never  Vaping Use   Vaping Use: Never used  Substance and Sexual Activity   Alcohol use: No    Alcohol/week: 0.0 standard drinks   Drug use: No   Sexual activity: Yes    Birth control/protection: Surgical  Other Topics Concern   Not on file  Social History Narrative   Not on file   Social Determinants of Health   Financial Resource Strain: Not on file  Food Insecurity: No Food Insecurity   Worried About Running Out of Food in the Last Year: Never true   Ran Out of Food in the Last Year: Never true  Transportation Needs: No Transportation Needs   Lack of Transportation (Medical): No   Lack of Transportation (Non-Medical): No  Physical Activity: Not on file  Stress: Not on file  Social Connections: Not on file  Intimate Partner Violence: Not on file      Review of Systems  Constitutional:  Negative for chills, fatigue and unexpected weight change.  HENT:  Negative for congestion, postnasal drip, rhinorrhea, sneezing and  sore throat.   Eyes:  Negative for redness.  Respiratory:  Negative for cough, chest tightness and shortness of breath.   Cardiovascular:  Negative for chest pain and palpitations.  Gastrointestinal:  Negative for abdominal pain, constipation, diarrhea, nausea and vomiting.  Genitourinary:  Negative for dysuria and frequency.  Musculoskeletal:  Negative for arthralgias, back pain, joint swelling and neck pain.  Skin:  Negative for rash.  Neurological: Negative.  Negative for tremors and numbness.  Hematological:  Negative for adenopathy. Does not bruise/bleed easily.  Psychiatric/Behavioral:  Negative for behavioral problems (Depression), sleep disturbance and suicidal ideas. The patient is not nervous/anxious.    Vital Signs: Ht 5' 7.5" (  1.715 m)   Wt 256 lb (116.1 kg)   BMI 39.50 kg/m    Observation/Objective: Patient is seen Virtual visit does look somewhat tired    Assessment/Plan: 1. Acute bronchitis due to COVID-19 virus Improving gradually we will continue to monitor increase activity as tolerated hydrate take only Tylenol - CBC with Differential/Platelet - Iron, TIBC and Ferritin Panel - B12 and Folate Panel  2. Upper GI bleeding Upper GI bleed due to excessive use of ibuprofen patient is to continue on Protonix 40 mg twice a day we will check CBC - CBC with Differential/Platelet - Iron, TIBC and Ferritin Panel - B12 and Folate Panel   General Counseling: Milea verbalizes understanding of the findings of today's phone visit and agrees with plan of treatment. I have discussed any further diagnostic evaluation that may be needed or ordered today. We also reviewed her medications today. she has been encouraged to call the office with any questions or concerns that should arise related to todays visit. Patient will be okay to go back to work on March 20, 2021. A work note was also given to the patient   Orders Placed This Encounter  Procedures   CBC with  Differential/Platelet   Iron, TIBC and Ferritin Panel   B12 and Folate Panel    No orders of the defined types were placed in this encounter.   Time spent:30 Minutes    Dr Lavera Guise Internal medicine

## 2021-03-21 ENCOUNTER — Other Ambulatory Visit: Payer: Self-pay | Admitting: *Deleted

## 2021-03-21 NOTE — Patient Instructions (Addendum)
Goals Addressed             This Visit's Progress    [facility]) Track and Manage My Blood Pressure-Hypertension       Timeframe:  Long-Range Goal Priority:  High Start Date: 08/22/20                           Expected End Date: 09/01/21                      Follow Up Date 07/02/21    - check blood pressure daily - choose a place to take my blood pressure (home, clinic or office, retail store) - write blood pressure results in a log or diary  -Encourage patient to limit the amount of salt in her diet  Why is this important?   You won't feel high blood pressure, but it can still hurt your blood vessels.  High blood pressure can cause heart or kidney problems. It can also cause a stroke.  Making lifestyle changes like losing a little weight or eating less salt will help.  Checking your blood pressure at home and at different times of the day can help to control blood pressure.  If the doctor prescribes medicine remember to take it the way the doctor ordered.  Call the office if you cannot afford the medicine or if there are questions about it.     Notes: Patient states that she takes her B/P and records the values most days. She does try to maintain a healthy diet and limits her sodium, sugar, and carbohydrate intake. She reports walking 1-2 times during the week during her lunch hour which also helps with her stress. Patient reports having a very stressful job. She manages her stress by relaxing whenever possible, enjoying entertainment activities, and goes to counseling.   Updated 03/21/21: Patient states she has not taking her B/P in about a week. She explains that she has been sick with covid. Due to the aches from covid she took a lot of Ibuprofen which caused her to have blood in her stool and resulted in her being hospitalized over night. Patient states she is better other than feeling weak due to recovering from covid. Patient was able to return to work on 03/20/21 and states she will  begin to take her B/P record the values. Patient did state that previous to her illness her B/P values were in the normal range and she feels her hypertension is in good control. Patient states she has not been walking due to the heat. She shared that she has been physically active by moving recently and she is working towards getting her new home fixed up and organized.

## 2021-03-21 NOTE — Patient Outreach (Signed)
[facility]) Care Management  [place]  03/21/2021   Ashley Stephens 1975/09/12 [id number]  Subjective: Successful telephone outreach call to patient. HIPAA identifiers obtained. Patient states she has not taking her B/P in about a week. She explains that she has been sick with covid. Due to the aches from covid she took a lot of Ibuprofen which caused her to have blood in her stool and resulted in her being hospitalized over night. Patient states she is better other than feeling weak due to recovering from covid. Patient was able to return to work on 03/20/21 and states she will begin to take her B/P record the values. Patient did report that previous to her illness her B/P values were in the normal range and she feels her hypertension is in good control. Patient states she has not been walking due to the heat. She shared that she has been physically active by moving recently and she is working towards getting her new home fixed up and organized.  Patient's last A1c was 5.5 on 12/30/20. Patient did not have any further questions or concerns today and did confirm that she has this nurse's contact number to call her if needed.   Encounter Medications:  Outpatient Encounter Medications as of 03/21/2021  Medication Sig Note   ALPRAZolam (XANAX) 0.25 MG tablet Take 1 tablet (0.25 mg total) by mouth 2 (two) times daily as needed. for anxiety    cholecalciferol (VITAMIN D3) 25 MCG (1000 UNIT) tablet Take 1,000 Units by mouth daily.    Cyanocobalamin (VITAMIN B 12 PO) Take by mouth.    desvenlafaxine (PRISTIQ) 100 MG 24 hr tablet Take 1 tablet (100 mg total) by mouth every morning.    desvenlafaxine (PRISTIQ) 50 MG 24 hr tablet Take 1 tablet (50 mg total) by mouth every evening.    Dulaglutide (TRULICITY) 1.5 TD/1.7OH SOPN Inject 1.5 mg into the skin once a week.    hydrOXYzine (VISTARIL) 50 MG capsule Take 1 capsule by mouth three times daily as needed    lamoTRIgine  (LAMICTAL) 150 MG tablet Take 1 tablet (150 mg total) by mouth 2 (two) times daily.    levothyroxine (SYNTHROID) 25 MCG tablet Take 1 tablet (25 mcg total) by mouth daily before breakfast.    lurasidone (LATUDA) 80 MG TABS tablet Take 1 tablet (80 mg total) by mouth daily with breakfast.    Oxcarbazepine (TRILEPTAL) 300 MG tablet Take 1 tablet (300 mg total) by mouth 2 (two) times daily.    pantoprazole (PROTONIX) 40 MG tablet Take by mouth.    propranolol (INDERAL) 20 MG tablet Take 1 tablet (20 mg total) by mouth 3 (three) times daily as needed.    omeprazole (PRILOSEC) 40 MG capsule Take 1 capsule (40 mg total) by mouth daily. (Patient not taking: Reported on 03/21/2021) 03/21/2021: Prescribed protonix    No facility-administered encounter medications on file as of 03/21/2021.    Functional Status:  In your present state of health, do you have any difficulty performing the following activities: 10/03/2020  Hearing? N  Vision? N  Difficulty concentrating or making decisions? N  Walking or climbing stairs? N  Dressing or bathing? N  Doing errands, shopping? N  Some recent data might be hidden    Fall/Depression Screening: Fall Risk  03/21/2021 12/30/2020 10/03/2020  Falls in the past year? 0 0 0  Number falls in past yr: 0 - -  Injury with Fall? 0 - -  Risk for fall due to : -  No Fall Risks -  Follow up Falls evaluation completed Falls evaluation completed -   PHQ 2/9 Scores 12/30/2020 10/03/2020 08/22/2020 05/24/2020 04/05/2020 12/03/2019 06/26/2019  PHQ - 2 Score 0 3 2 0 0 0 0  PHQ- 9 Score - - 4 - - - -  Some encounter information is confidential and restricted. Go to Review Flowsheets activity to see all data.    Assessment:   Care Plan Care Plan : Hypertension (Adult)  Updates made by Michiel Cowboy, RN since 03/21/2021 12:00 AM     Problem: Hypertension (Hypertension)   Priority: Medium     Long-Range Goal: Hypertension Monitored   Start Date: 08/22/2020  Expected End Date:  09/01/2021  Note:   Evidence-based guidance:  Promote initial use of ambulatory blood pressure measurements (for 3 days) to rule out "white-coat" effect; identify masked hypertension and presence or absence of nocturnal "dipping" of blood pressure.   Encourage continued use of home blood pressure monitoring and recording in blood pressure log; include symptoms of hypotension or potential medication side effects in log.  Review blood pressure measurements taken inside and outside of the provider office; establish baseline and monitor trends; compare to target ranges or patient goal.  Share overall cardiovascular risk with patient; encourage changes to lifestyle risk factors, including alcohol consumption, smoking, inadequate exercise, poor dietary habits and stress.   Notes:     Task: Identify and Monitor Blood Pressure Elevation   Due Date: 09/01/2021  Note:   Care Management Activities:    - blood pressure trends reviewed - depression screen reviewed - home or ambulatory blood pressure monitoring encouraged    Notes:     Problem: Disease Progression (Hypertension)   Priority: Medium     Long-Range Goal: Disease Progression Prevented or Minimized   Start Date: 08/22/2020  Expected End Date: 09/01/2021  Note:   Evidence-based guidance:  Tailor lifestyle advice to individual; review progress regularly; give frequent encouragement and respond positively to incremental successes.  Assess for and promote awareness of worsening disease or development of comorbidity.  Prepare patient for laboratory and diagnostic exams based on risk and presentation.  Prepare patient for use of pharmacologic therapy that may include diuretic, beta-blocker, beta-blocker/thiazide combination, angiotensin-converting enzyme inhibitor, renin-angiotensin blocker or calcium-channel blocker.  Expect periodic adjustments to pharmacologic therapy; manage side effects.  Promote a healthy diet that includes  primarily plant-based foods, such as fruits, vegetables, whole grains, beans and legumes, low-fat dairy and lean meats.   Consider moderate reduction in sodium intake by avoiding the addition of salt to prepared foods and limiting processed meats, canned soup, frozen meals and salty snacks.   Promote a regular, daily exercise goal of 150 minutes per week of moderate exercise based on tolerance, ability and patient choice; consider referral to physical therapist, community wellness and/or activity program.  Encourage the avoidance of no more than 2 hours per day of sedentary activity, such as recreational screen time.  Review sources of stress; explore current coping strategies and encourage use of mindfulness, yoga, meditation or exercise to manage stress.   Notes:     Task: Alleviate Barriers to Hypertension Treatment   Due Date: 09/01/2021  Note:   Care Management Activities:    - difficulty of making life-long changes acknowledged - healthy diet promoted - healthy family lifestyle promoted - medical nutrition therapy provided - pain assessed and managed - quality of sleep assessed - reduction of dietary sodium encouraged - reduction in sedentary activities encouraged  Notes:       Goals Addressed             This Visit's Progress    [facility]) Track and Manage My Blood Pressure-Hypertension       Timeframe:  Long-Range Goal Priority:  High Start Date: 08/22/20                           Expected End Date: 09/01/21                      Follow Up Date 07/02/21    - check blood pressure daily - choose a place to take my blood pressure (home, clinic or office, retail store) - write blood pressure results in a log or diary  -Encourage patient to limit the amount of salt in her diet  Why is this important?   You won't feel high blood pressure, but it can still hurt your blood vessels.  High blood pressure can cause heart or kidney problems. It can also cause a stroke.   Making lifestyle changes like losing a little weight or eating less salt will help.  Checking your blood pressure at home and at different times of the day can help to control blood pressure.  If the doctor prescribes medicine remember to take it the way the doctor ordered.  Call the office if you cannot afford the medicine or if there are questions about it.     Notes: Patient states that she takes her B/P and records the values most days. She does try to maintain a healthy diet and limits her sodium, sugar, and carbohydrate intake. She reports walking 1-2 times during the week during her lunch hour which also helps with her stress. Patient reports having a very stressful job. She manages her stress by relaxing whenever possible, enjoying entertainment activities, and goes to counseling.   Updated 03/21/21: Patient states she has not taking her B/P in about a week. She explains that she has been sick with covid. Due to the aches from covid she took a lot of Ibuprofen which caused her to have blood in her stool and resulted in her being hospitalized over night. Patient states she is better other than feeling weak due to recovering from covid. Patient was able to return to work on 03/20/21 and states she will begin to take her B/P record the values. Patient did state that previous to her illness her B/P values were in the normal range and she feels her hypertension is in good control. Patient states she has not been walking due to the heat. She shared that she has been physically active by moving recently and she is working towards getting her new home fixed up and organized.        Plan: RN Health Coach will send PCP today's quarterly report and will call patient within the month of October. Follow-up: Patient agrees to Care Plan and Follow-up.  [name] RN, BSN [place] [phone] [email or web address]

## 2021-03-30 ENCOUNTER — Other Ambulatory Visit: Payer: Self-pay | Admitting: Physician Assistant

## 2021-03-30 ENCOUNTER — Ambulatory Visit: Payer: 59 | Admitting: Physician Assistant

## 2021-03-30 ENCOUNTER — Other Ambulatory Visit: Payer: Self-pay

## 2021-03-30 ENCOUNTER — Encounter: Payer: Self-pay | Admitting: Physician Assistant

## 2021-03-30 VITALS — BP 120/80 | HR 84 | Temp 97.1°F | Resp 16 | Ht 67.0 in | Wt 256.0 lb

## 2021-03-30 DIAGNOSIS — E039 Hypothyroidism, unspecified: Secondary | ICD-10-CM

## 2021-03-30 DIAGNOSIS — E782 Mixed hyperlipidemia: Secondary | ICD-10-CM | POA: Diagnosis not present

## 2021-03-30 DIAGNOSIS — N959 Unspecified menopausal and perimenopausal disorder: Secondary | ICD-10-CM

## 2021-03-30 DIAGNOSIS — M5481 Occipital neuralgia: Secondary | ICD-10-CM

## 2021-03-30 DIAGNOSIS — E1165 Type 2 diabetes mellitus with hyperglycemia: Secondary | ICD-10-CM | POA: Diagnosis not present

## 2021-03-30 LAB — POCT GLYCOSYLATED HEMOGLOBIN (HGB A1C): Hemoglobin A1C: 5.7 % — AB (ref 4.0–5.6)

## 2021-03-30 MED ORDER — ROSUVASTATIN CALCIUM 5 MG PO TABS: 5.0000 mg | ORAL_TABLET | Freq: Every day | ORAL | 3 refills | Status: DC

## 2021-03-30 NOTE — Progress Notes (Signed)
[facility] [place], [place] [address]  Internal MEDICINE  Office Visit Note  Patient Name: Ashley Stephens  [id number]  [id number]  Date of Service: 04/02/2021  Chief Complaint  Patient presents with   Follow-up    care consideration,consider TSH monitoring/clt/[facility]N GAP DIABETIC EYE EXAM   Depression   Diabetes   Gastroesophageal Reflux   Hypertension   Anxiety   Quality Metric Gaps    Eye exam    HPI Pt is here for routine follow up -Pain in back of head and around the side. Tender to the touch along occipitals. Will refer to neurology due to symptoms consistent with occipital neuralgia and may benefit from trigger point injections -Night sweats and thinks she may be menopausal. She had a hysterectomy a long time ago, but her ovaries remain--interested in symptomatic treatment and having labs checked -A1c is 5.7 today -She is followed by behavioral health for anxiety, insomnia, and mood disorder  Current Medication: Outpatient Encounter Medications as of 03/30/2021  Medication Sig Note   ALPRAZolam (XANAX) 0.25 MG tablet Take 1 tablet (0.25 mg total) by mouth 2 (two) times daily as needed. for anxiety    cholecalciferol (VITAMIN D3) 25 MCG (1000 UNIT) tablet Take 1,000 Units by mouth daily.    Cyanocobalamin (VITAMIN B 12 PO) Take by mouth.    desvenlafaxine (PRISTIQ) 100 MG 24 hr tablet Take 1 tablet (100 mg total) by mouth every morning.    desvenlafaxine (PRISTIQ) 50 MG 24 hr tablet Take 1 tablet (50 mg total) by mouth every evening.    Dulaglutide (TRULICITY) 1.5 0000000 SOPN Inject 1.5 mg into the skin once a week.    hydrOXYzine (VISTARIL) 50 MG capsule Take 1 capsule by mouth three times daily as needed    lamoTRIgine (LAMICTAL) 150 MG tablet Take 1 tablet (150 mg total) by mouth 2 (two) times daily.    levothyroxine (SYNTHROID) 25 MCG tablet Take 1 tablet (25 mcg total) by mouth daily before breakfast.    lurasidone (LATUDA) 80 MG  TABS tablet Take 1 tablet (80 mg total) by mouth daily with breakfast.    omeprazole (PRILOSEC) 40 MG capsule Take 1 capsule (40 mg total) by mouth daily. 03/21/2021: Prescribed protonix    Oxcarbazepine (TRILEPTAL) 300 MG tablet Take 1 tablet (300 mg total) by mouth 2 (two) times daily.    pantoprazole (PROTONIX) 40 MG tablet Take by mouth.    rosuvastatin (CRESTOR) 5 MG tablet Take 1 tablet (5 mg total) by mouth daily.    propranolol (INDERAL) 20 MG tablet Take 1 tablet (20 mg total) by mouth 3 (three) times daily as needed.    No facility-administered encounter medications on file as of 03/30/2021.    Surgical History: Past Surgical History:  Procedure Laterality Date   ABDOMINAL HYSTERECTOMY  2013   BARIATRIC SURGERY     BREAST BIOPSY  1995   COLONOSCOPY WITH PROPOFOL N/A 10/28/2020   Procedure: COLONOSCOPY WITH PROPOFOL;  Surgeon: Jonathon Bellows, MD;  Location: [facility] ENDOSCOPY;  Service: Gastroenterology;  Laterality: N/A;   GASTRIC BYPASS  2012   TUBAL LIGATION  1996   tummy tuck  05/27/2020    Medical History: Past Medical History:  Diagnosis Date   Anxiety    Depression    Diabetes mellitus without complication ([place])    Gastric ulcer    GERD (gastroesophageal reflux disease)    Hypertension    Osteoarthritis    Thyroid disorder     Family History:  Family History  Problem Relation Age of Onset   Breast cancer Maternal Grandmother    Diabetes Maternal Grandmother    Heart disease Maternal Grandmother    Hypertension Maternal Grandmother    Heart disease Brother    Hypertension Brother    Hyperlipidemia Brother    Diabetes Maternal Aunt    Heart disease Maternal Aunt    Hypertension Maternal Aunt    Hyperlipidemia Maternal Aunt    Diabetes Maternal Aunt    Heart disease Maternal Aunt    Hypertension Maternal Aunt    Hyperlipidemia Maternal Aunt    Diabetes Maternal Aunt    Heart disease Maternal Aunt    Hypertension Maternal Aunt    Hyperlipidemia Maternal Aunt      Social History   Socioeconomic History   Marital status: Single    Spouse name: Not on file   Number of children: 1   Years of education: Not on file   Highest education level: Not on file  Occupational History   Occupation: Engineer, drilling  Tobacco Use   Smoking status: Never   Smokeless tobacco: Never  Vaping Use   Vaping Use: Never used  Substance and Sexual Activity   Alcohol use: No    Alcohol/week: 0.0 standard drinks   Drug use: No   Sexual activity: Yes    Birth control/protection: Surgical  Other Topics Concern   Not on file  Social History Narrative   Not on file   Social Determinants of Health   Financial Resource Strain: Not on file  Food Insecurity: No Food Insecurity   Worried About Running Out of Food in the Last Year: Never true   Ran Out of Food in the Last Year: Never true  Transportation Needs: No Transportation Needs   Lack of Transportation (Medical): No   Lack of Transportation (Non-Medical): No  Physical Activity: Not on file  Stress: Not on file  Social Connections: Not on file  Intimate Partner Violence: Not on file      Review of Systems  Constitutional:  Positive for fatigue. Negative for chills and unexpected weight change.  HENT:  Negative for congestion, postnasal drip, rhinorrhea, sneezing and sore throat.   Eyes:  Negative for redness.  Respiratory:  Negative for cough, chest tightness and shortness of breath.   Cardiovascular:  Negative for chest pain and palpitations.  Gastrointestinal:  Negative for abdominal pain, constipation, diarrhea, nausea and vomiting.  Endocrine:       Night sweats  Genitourinary:  Negative for dysuria and frequency.  Musculoskeletal:  Negative for arthralgias, back pain, joint swelling and neck pain.  Skin:  Negative for rash.  Neurological:  Positive for headaches. Negative for tremors and numbness.       Headaches from back of head that radiate up and over top of head/sides, tender to touch  in back of head  Hematological:  Negative for adenopathy. Does not bruise/bleed easily.  Psychiatric/Behavioral:  Positive for sleep disturbance. Negative for behavioral problems (Depression) and suicidal ideas. The patient is not nervous/anxious.    Vital Signs: BP 120/80   Pulse 84   Temp (!) 97.1 F (36.2 C)   Resp 16   Ht '5\' 7"'$  (1.702 m)   Wt 256 lb (116.1 kg)   SpO2 99%   BMI 40.10 kg/m    Physical Exam Vitals and nursing note reviewed.  Constitutional:      General: She is not in acute distress.    Appearance: She is well-developed. She is  obese. She is not diaphoretic.  HENT:     Head: Normocephalic and atraumatic.     Comments: Tender to palpation over occipital region    Mouth/Throat:     Pharynx: No oropharyngeal exudate.  Eyes:     Pupils: Pupils are equal, round, and reactive to light.  Neck:     Thyroid: No thyromegaly.     Vascular: No JVD.     Trachea: No tracheal deviation.  Cardiovascular:     Rate and Rhythm: Normal rate and regular rhythm.     Heart sounds: Normal heart sounds. No murmur heard.   No friction rub. No gallop.  Pulmonary:     Effort: Pulmonary effort is normal. No respiratory distress.     Breath sounds: No wheezing or rales.  Chest:     Chest wall: No tenderness.  Abdominal:     General: Bowel sounds are normal.     Palpations: Abdomen is soft.  Musculoskeletal:        General: Normal range of motion.     Cervical back: Normal range of motion and neck supple.  Lymphadenopathy:     Cervical: No cervical adenopathy.  Skin:    General: Skin is warm and dry.  Neurological:     General: No focal deficit present.     Mental Status: She is alert and oriented to person, place, and time.     Cranial Nerves: No cranial nerve deficit.  Psychiatric:        Behavior: Behavior normal.        Thought Content: Thought content normal.        Judgment: Judgment normal.       Assessment/Plan: 1. Type 2 diabetes mellitus with  hyperglycemia, without long-term current use of insulin (HCC) - POCT HgB A1C is 5.7--continue to improve diet and exercise to control blood sugars, will monitor closely  2. Bilateral occipital neuralgia Will refer to neurology for further evaluation and possible trigger point injections based on symptoms - Ambulatory referral to Neurology  3. Menopausal and perimenopausal disorder Will check hormone levels and treat accordingly - FSH/LH  4. Acquired hypothyroidism Will update labs and adjust Synthroid dose as indicated - TSH + free T4  5. Mixed hyperlipidemia Will start on Crestor due to elevated lipids - rosuvastatin (CRESTOR) 5 MG tablet; Take 1 tablet (5 mg total) by mouth daily.  Dispense: 90 tablet; Refill: 3   General Counseling: Kimberlye verbalizes understanding of the findings of todays visit and agrees with plan of treatment. I have discussed any further diagnostic evaluation that may be needed or ordered today. We also reviewed her medications today. she has been encouraged to call the office with any questions or concerns that should arise related to todays visit.    Orders Placed This Encounter  Procedures   FSH/LH   TSH + free T4   Ambulatory referral to Neurology   POCT HgB A1C    Meds ordered this encounter  Medications   rosuvastatin (CRESTOR) 5 MG tablet    Sig: Take 1 tablet (5 mg total) by mouth daily.    Dispense:  90 tablet    Refill:  3     This patient was seen by Drema Dallas, PA-C in collaboration with Dr. Clayborn Bigness as a part of collaborative care agreement.   Total time spent:35 Minutes Time spent includes review of chart, medications, test results, and follow up plan with the patient.      Dr Lavera Guise Internal  medicine

## 2021-03-31 LAB — FSH/LH
FSH: 24 m[IU]/mL
LH: 19.6 m[IU]/mL

## 2021-03-31 LAB — TSH+FREE T4
Free T4: 0.92 ng/dL (ref 0.82–1.77)
TSH: 2.84 u[IU]/mL (ref 0.450–4.500)

## 2021-04-05 ENCOUNTER — Telehealth: Payer: Self-pay

## 2021-04-05 NOTE — Telephone Encounter (Signed)
Short term disability paperwork placed on Dr. Etta Quill desk to be completed.

## 2021-04-11 ENCOUNTER — Encounter: Payer: Self-pay | Admitting: Neurology

## 2021-04-13 ENCOUNTER — Telehealth (INDEPENDENT_AMBULATORY_CARE_PROVIDER_SITE_OTHER): Payer: 59 | Admitting: Psychiatry

## 2021-04-13 ENCOUNTER — Encounter: Payer: Self-pay | Admitting: Psychiatry

## 2021-04-13 ENCOUNTER — Telehealth: Payer: Self-pay

## 2021-04-13 DIAGNOSIS — F411 Generalized anxiety disorder: Secondary | ICD-10-CM | POA: Diagnosis not present

## 2021-04-13 DIAGNOSIS — F5101 Primary insomnia: Secondary | ICD-10-CM

## 2021-04-13 DIAGNOSIS — F39 Unspecified mood [affective] disorder: Secondary | ICD-10-CM

## 2021-04-13 DIAGNOSIS — R69 Illness, unspecified: Secondary | ICD-10-CM | POA: Diagnosis not present

## 2021-04-13 MED ORDER — LURASIDONE HCL 60 MG PO TABS: 60.0000 mg | ORAL_TABLET | Freq: Every day | ORAL | 1 refills | Status: DC

## 2021-04-13 MED ORDER — LAMOTRIGINE 150 MG PO TABS: 150.0000 mg | ORAL_TABLET | Freq: Two times a day (BID) | ORAL | 1 refills | Status: DC

## 2021-04-13 MED ORDER — OXCARBAZEPINE 300 MG PO TABS: 300.0000 mg | ORAL_TABLET | Freq: Two times a day (BID) | ORAL | 0 refills | Status: DC

## 2021-04-13 NOTE — Telephone Encounter (Signed)
Disability paperwork completed by provider and faxed back to [employer] along with supporting clinical notes at [phone]. Copy placed in scan,original at the front desk for patient. Patient has been advised.

## 2021-04-13 NOTE — Progress Notes (Signed)
Ashley Stephens [id number] Jan 24, 1976 45 y.o.  Virtual Visit via Video Note  I connected with pt @ on 04/13/21 at  3:30 PM EDT by a video enabled telemedicine application and verified that I am speaking with the correct person using two identifiers.   I discussed the limitations of evaluation and management by telemedicine and the availability of in person appointments. The patient expressed understanding and agreed to proceed.  I discussed the assessment and treatment plan with the patient. The patient was provided an opportunity to ask questions and all were answered. The patient agreed with the plan and demonstrated an understanding of the instructions.   The patient was advised to call back or seek an in-person evaluation if the symptoms worsen or if the condition fails to improve as anticipated.  I provided 30 minutes of non-face-to-face time during this encounter.  The patient was located at home.  The provider was located at [place].   [name], PMHNP   Subjective:   Patient ID:  Ashley Stephens is a 45 y.o. (DOB 1975/11/14) female.  Chief Complaint:  Chief Complaint  Patient presents with   Anxiety   Depression     HPI Ashley Stephens presents for follow-up of mood disturbance, anxiety, and sleep disturbance. She reports that she has been "more stressed." Aunt died 03-28-21. Went on vacation. July 4th was dx'd with COVID. Was seen in ER on 03/12/21 and had GI bleed after taking Ibuprofen for fever and other COVID s/s.   She reports feeling physically tired and "mentally exhausted." She reports depressed mood. Or impulsivity. Mood lability. Denies elevated mood She reports that she has some frustration and feels that she is managing it well. Has some health-related anxiety. Work also is a source of anxiety. She reports that she has constant anxious thoughts and wondering what is going to happen. She falls asleep without difficulty She  reports waking up around 1-2 am and has difficulty returning to sleep. She reports that she has been "binging" and is eating at times because it seems "calming" and then causes negative effects. She reports poor concentration. Motivation is low. Denies SI.   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   Past medication trials: Wellbutrin Effexor Pristiq-effective Lamictal-effective for mood Latuda Xanax- Usually takes at night Propanolol- Effective Hydroxyzine Rexulti Gabapentin- prescribed for back pain. Dizziness, headaches. Lithium Trileptal  Review of Systems:  Review of Systems  Gastrointestinal: Negative.   Musculoskeletal:  Negative for gait problem.  Neurological:  Negative for tremors.       Reports that she has pain in the back of her head and is awaiting neurology evaluation.   Psychiatric/Behavioral:         Please refer to HPI   Medications: I have reviewed the patient's current medications.  Current Outpatient Medications  Medication Sig Dispense Refill   ALPRAZolam (XANAX) 0.25 MG tablet Take 1 tablet (0.25 mg total) by mouth 2 (two) times daily as needed. for anxiety 45 tablet 5   cholecalciferol (VITAMIN D3) 25 MCG (1000 UNIT) tablet Take 1,000 Units by mouth daily.     Cyanocobalamin (VITAMIN B 12 PO) Take by mouth.     desvenlafaxine (PRISTIQ) 100 MG 24 hr tablet Take 1 tablet (100 mg total) by mouth every morning. 90 tablet 1   desvenlafaxine (PRISTIQ) 50 MG 24 hr tablet Take 1 tablet (50 mg total) by mouth every evening. 90 tablet 1   Dulaglutide (TRULICITY)  1.5 MG/0.5ML SOPN Inject 1.5 mg into the skin once a week. 3 mL 12   hydrOXYzine (VISTARIL) 50 MG capsule Take 1 capsule by mouth three times daily as needed 90 capsule 1   levothyroxine (SYNTHROID) 25 MCG tablet Take 1 tablet (25 mcg total) by mouth daily before breakfast. 90 tablet 2   omeprazole (PRILOSEC) 40 MG capsule Take 1 capsule (40 mg total) by mouth daily.  (Patient taking differently: Take 40 mg by mouth daily as needed.) 30 capsule 3   rosuvastatin (CRESTOR) 5 MG tablet Take 1 tablet (5 mg total) by mouth daily. 90 tablet 3   lamoTRIgine (LAMICTAL) 150 MG tablet Take 1 tablet (150 mg total) by mouth 2 (two) times daily. 180 tablet 1   Lurasidone HCl (LATUDA) 60 MG TABS Take 1 tablet (60 mg total) by mouth daily with supper. 30 tablet 1   Oxcarbazepine (TRILEPTAL) 300 MG tablet Take 1 tablet (300 mg total) by mouth 2 (two) times daily. 180 tablet 0   propranolol (INDERAL) 20 MG tablet Take 1 tablet (20 mg total) by mouth 3 (three) times daily as needed. 270 tablet 1   No current facility-administered medications for this visit.    Medication Side Effects: None  Denies any involuntary movements.  Allergies: No Known Allergies  Past Medical History:  Diagnosis Date   Anxiety    Depression    Diabetes mellitus without complication (HCC)    Gastric ulcer    GERD (gastroesophageal reflux disease)    Hypertension    Osteoarthritis    Thyroid disorder     Family History  Problem Relation Age of Onset   Breast cancer Maternal Grandmother    Diabetes Maternal Grandmother    Heart disease Maternal Grandmother    Hypertension Maternal Grandmother    Heart disease Brother    Hypertension Brother    Hyperlipidemia Brother    Diabetes Maternal Aunt    Heart disease Maternal Aunt    Hypertension Maternal Aunt    Hyperlipidemia Maternal Aunt    Diabetes Maternal Aunt    Heart disease Maternal Aunt    Hypertension Maternal Aunt    Hyperlipidemia Maternal Aunt    Diabetes Maternal Aunt    Heart disease Maternal Aunt    Hypertension Maternal Aunt    Hyperlipidemia Maternal Aunt     Social History   Socioeconomic History   Marital status: Single    Spouse name: Not on file   Number of children: 1   Years of education: Not on file   Highest education level: Not on file  Occupational History   Occupation: Engineer, drilling  Tobacco  Use   Smoking status: Never   Smokeless tobacco: Never  Vaping Use   Vaping Use: Never used  Substance and Sexual Activity   Alcohol use: No    Alcohol/week: 0.0 standard drinks   Drug use: No   Sexual activity: Yes    Birth control/protection: Surgical  Other Topics Concern   Not on file  Social History Narrative   Not on file   Social Determinants of Health   Financial Resource Strain: Not on file  Food Insecurity: No Food Insecurity   Worried About Running Out of Food in the Last Year: Never true   Ran Out of Food in the Last Year: Never true  Transportation Needs: No Transportation Needs   Lack of Transportation (Medical): No   Lack of Transportation (Non-Medical): No  Physical Activity: Not on file  Stress: Not on  file  Social Connections: Not on file  Intimate Partner Violence: Not on file    Past Medical History, Surgical history, Social history, and Family history were reviewed and updated as appropriate.   Please see review of systems for further details on the patient's review from today.   Objective:   Physical Exam:  There were no vitals taken for this visit.  Physical Exam Neurological:     Mental Status: She is alert and oriented to person, place, and time.     Cranial Nerves: No dysarthria.  Psychiatric:        Attention and Perception: Attention and perception normal.        Mood and Affect: Mood is anxious and depressed.        Speech: Speech normal.        Behavior: Behavior is cooperative.        Thought Content: Thought content normal. Thought content is not paranoid or delusional. Thought content does not include homicidal or suicidal ideation. Thought content does not include homicidal or suicidal plan.        Cognition and Memory: Cognition and memory normal.        Judgment: Judgment normal.     Comments: Insight intact    Lab Review:     Component Value Date/Time   NA 139 12/30/2020 0941   NA 142 12/03/2011 1059   K 4.6 12/30/2020  0941   K 3.4 (L) 12/03/2011 1059   CL 101 12/30/2020 0941   CL 107 12/03/2011 1059   CO2 21 12/30/2020 0941   CO2 27 12/03/2011 1059   GLUCOSE 88 12/30/2020 0941   GLUCOSE 92 07/12/2020 1526   GLUCOSE 66 12/03/2011 1059   BUN 7 12/30/2020 0941   BUN 4 (L) 12/03/2011 1059   CREATININE 0.84 12/30/2020 0941   CREATININE 0.82 12/03/2011 1059   CALCIUM 9.3 12/30/2020 0941   CALCIUM 8.5 12/03/2011 1059   PROT 6.4 12/30/2020 0941   ALBUMIN 4.3 12/30/2020 0941   AST 17 12/30/2020 0941   ALT 10 12/30/2020 0941   ALKPHOS 79 12/30/2020 0941   BILITOT 0.3 12/30/2020 0941   GFRNONAA >60 07/12/2020 1526   GFRNONAA >60 12/03/2011 1059   GFRAA 78 07/10/2019 0953   GFRAA >60 12/03/2011 1059       Component Value Date/Time   WBC 4.8 12/30/2020 0941   WBC 7.0 07/12/2020 1526   RBC 4.69 12/30/2020 0941   RBC 4.76 07/12/2020 1526   HGB 14.0 12/30/2020 0941   HCT 40.8 12/30/2020 0941   PLT 311 12/30/2020 0941   MCV 87 12/30/2020 0941   MCH 29.9 12/30/2020 0941   MCH 29.4 07/12/2020 1526   MCHC 34.3 12/30/2020 0941   MCHC 33.3 07/12/2020 1526   RDW 12.9 12/30/2020 0941   LYMPHSABS 1.4 12/30/2020 0941   MONOABS 0.6 07/12/2020 1526   EOSABS 0.1 12/30/2020 0941   BASOSABS 0.0 12/30/2020 0941    No results found for: POCLITH, LITHIUM   No results found for: PHENYTOIN, PHENOBARB, VALPROATE, CBMZ   .res Assessment: Plan:    Patient seen for 30 minutes and time spent discussing possible treatment options since she reports that she would like to make some medication changes due to worsening mood and anxiety signs and symptoms.  Discussed decreasing Latuda from '80mg'$  to 60 mg daily to determine if this may be more effective since lower doses of Latuda are typically better for depression and higher doses can cause affective dulling and apathy.  Discussed  that Latuda could be increased back up to 80 mg if she experiences worsening mood signs and symptoms with decrease in dose.  Patient agrees to  try a lower dose of Latuda 60 mg daily. Continue Xanax 0.25 mg twice daily as needed for anxiety. Continue Pristiq 100 mg in the morning and 50 mg in the evening for depression. Continue hydroxyzine 50 mg 3 times daily as needed for anxiety. Continue lamotrigine 150 mg twice daily for mood signs and symptoms. Continue Trileptal 300 mg twice daily for mood and anxiety signs and symptoms. Continue propanolol 20 mg 3 times daily as needed for anxiety. Patient to follow-up in 4 weeks or sooner if clinically indicated. Patient advised to contact office with any questions, adverse effects, or acute worsening in signs and symptoms.   Ashley Stephens was seen today for anxiety and depression.  Diagnoses and all orders for this visit:  Mood disorder ([place]) -     Lurasidone HCl (LATUDA) 60 MG TABS; Take 1 tablet (60 mg total) by mouth daily with supper. -     lamoTRIgine (LAMICTAL) 150 MG tablet; Take 1 tablet (150 mg total) by mouth 2 (two) times daily. -     Oxcarbazepine (TRILEPTAL) 300 MG tablet; Take 1 tablet (300 mg total) by mouth 2 (two) times daily.  Generalized anxiety disorder -     Oxcarbazepine (TRILEPTAL) 300 MG tablet; Take 1 tablet (300 mg total) by mouth 2 (two) times daily.  Primary insomnia -     Oxcarbazepine (TRILEPTAL) 300 MG tablet; Take 1 tablet (300 mg total) by mouth 2 (two) times daily.    Please see After Visit Summary for patient specific instructions.  Future Appointments  Date Time Provider [place]  05/15/2021  8:30 AM [name], PMHNP [facility] None  06/20/2021  9:00 AM Wine, Argie Ramming, RN [facility] None  06/28/2021  7:50 AM Pieter Partridge, DO [facility] None  06/29/2021  9:20 AM McDonough, Si Gaul, PA-C [facility] None    No orders of the defined types were placed in this encounter.     -------------------------------

## 2021-05-02 ENCOUNTER — Ambulatory Visit: Payer: Self-pay | Admitting: *Deleted

## 2021-05-14 DIAGNOSIS — L309 Dermatitis, unspecified: Secondary | ICD-10-CM | POA: Diagnosis not present

## 2021-05-14 DIAGNOSIS — K289 Gastrojejunal ulcer, unspecified as acute or chronic, without hemorrhage or perforation: Secondary | ICD-10-CM | POA: Insufficient documentation

## 2021-05-14 DIAGNOSIS — T85898A Other specified complication of other internal prosthetic devices, implants and grafts, initial encounter: Secondary | ICD-10-CM | POA: Insufficient documentation

## 2021-05-14 DIAGNOSIS — K9189 Other postprocedural complications and disorders of digestive system: Secondary | ICD-10-CM | POA: Insufficient documentation

## 2021-05-15 ENCOUNTER — Telehealth (INDEPENDENT_AMBULATORY_CARE_PROVIDER_SITE_OTHER): Payer: 59 | Admitting: Psychiatry

## 2021-05-15 ENCOUNTER — Encounter: Payer: Self-pay | Admitting: Psychiatry

## 2021-05-15 DIAGNOSIS — F411 Generalized anxiety disorder: Secondary | ICD-10-CM | POA: Diagnosis not present

## 2021-05-15 DIAGNOSIS — F5101 Primary insomnia: Secondary | ICD-10-CM

## 2021-05-15 DIAGNOSIS — R69 Illness, unspecified: Secondary | ICD-10-CM | POA: Diagnosis not present

## 2021-05-15 DIAGNOSIS — F39 Unspecified mood [affective] disorder: Secondary | ICD-10-CM

## 2021-05-15 MED ORDER — DESVENLAFAXINE SUCCINATE ER 100 MG PO TB24: 100.0000 mg | ORAL_TABLET | ORAL | 1 refills | Status: DC

## 2021-05-15 MED ORDER — PROPRANOLOL HCL 20 MG PO TABS: 20.0000 mg | ORAL_TABLET | Freq: Three times a day (TID) | ORAL | 1 refills | Status: DC | PRN

## 2021-05-15 MED ORDER — HYDROXYZINE PAMOATE 50 MG PO CAPS: ORAL_CAPSULE | ORAL | 1 refills | Status: DC

## 2021-05-15 MED ORDER — DESVENLAFAXINE SUCCINATE ER 50 MG PO TB24: 50.0000 mg | ORAL_TABLET | Freq: Every evening | ORAL | 1 refills | Status: DC

## 2021-05-15 MED ORDER — OXCARBAZEPINE 300 MG PO TABS: 300.0000 mg | ORAL_TABLET | Freq: Two times a day (BID) | ORAL | 0 refills | Status: DC

## 2021-05-15 MED ORDER — LATUDA 60 MG PO TABS: 60.0000 mg | ORAL_TABLET | Freq: Every day | ORAL | 0 refills | Status: DC

## 2021-05-15 NOTE — Progress Notes (Signed)
Ashley Stephens [id number] June 21, 1976 45 y.o.  Virtual Visit via Video Note  I connected with pt @ on 05/15/21 at  8:30 AM EDT by a video enabled telemedicine application and verified that I am speaking with the correct person using two identifiers.   I discussed the limitations of evaluation and management by telemedicine and the availability of in person appointments. The patient expressed understanding and agreed to proceed.  I discussed the assessment and treatment plan with the patient. The patient was provided an opportunity to ask questions and all were answered. The patient agreed with the plan and demonstrated an understanding of the instructions.   The patient was advised to call back or seek an in-person evaluation if the symptoms worsen or if the condition fails to improve as anticipated.  I provided 30 minutes of non-face-to-face time during this encounter.  The patient was located at home.  The provider was located at [place].   [name], PMHNP   Subjective:   Patient ID:  Ashley Stephens is a 45 y.o. (DOB [birth year]) female.  Chief Complaint:  Chief Complaint  Patient presents with   Follow-up    Mood disturbance, anxiety, insomnia    HPI Ashley Stephens presents for follow-up of mood disturbance, anxiety and insomnia. She reports "a little bit better... I have been managing better." She notices some decrease in depression. She reports that she has multiple stressors. Reports mood has been more stable and having less mood lability. She reports that she was previously having increased difficulty managing stressors compounded by losses and grief. Denies panic attacks. She reports that she has some irritability and is managing this better by catching it earlier, removing herself from the situation, taking medication, etc. Denies elevated mood or impulsivity. She reports that her sleep has been good and is sleeping 5-7 hours a night.  Appetite is ok. She reports that she is working on not eating out of boredom or impulse. Energy and motivation have been good. Concentration is adequate. Denies SI.   She reports that heat is a trigger to her anxiety.   Adjusting to move. Reports that she is further geographically from where she used to be.   Past medication trials: Wellbutrin Effexor Pristiq-effective Lamictal-effective for mood Latuda Xanax- Usually takes at night Propanolol- Effective Hydroxyzine Rexulti Gabapentin- prescribed for back pain. Dizziness, headaches. Lithium Trileptal  Review of Systems:  Review of Systems  Gastrointestinal: Negative.   Musculoskeletal:  Negative for gait problem.  Skin:        Reports rash on her neck and went to urgent care. She was started on a steroid cream. Reports rash is intermittent. Denies change in detergents.   Neurological:  Negative for tremors.  Psychiatric/Behavioral:         Please refer to HPI   Medications: I have reviewed the patient's current medications.  Current Outpatient Medications  Medication Sig Dispense Refill   ALPRAZolam (XANAX) 0.25 MG tablet Take 1 tablet (0.25 mg total) by mouth 2 (two) times daily as needed. for anxiety 45 tablet 5   cholecalciferol (VITAMIN D3) 25 MCG (1000 UNIT) tablet Take 1,000 Units by mouth daily.     Cyanocobalamin (VITAMIN B 12 PO) Take by mouth.     desvenlafaxine (PRISTIQ) 100 MG 24 hr tablet Take 1 tablet (100 mg total) by mouth every morning. 90 tablet 1   desvenlafaxine (PRISTIQ) 50 MG 24 hr tablet Take 1 tablet (50 mg total) by mouth every evening. 90 tablet 1  Dulaglutide (TRULICITY) 1.5 NO/0.3BC SOPN Inject 1.5 mg into the skin once a week. 3 mL 12   hydrOXYzine (VISTARIL) 50 MG capsule Take 1 capsule by mouth three times daily as needed 90 capsule 1   lamoTRIgine (LAMICTAL) 150 MG tablet Take 1 tablet (150 mg total) by mouth 2 (two) times daily. 180 tablet 1   levothyroxine (SYNTHROID) 25 MCG tablet Take 1  tablet (25 mcg total) by mouth daily before breakfast. 90 tablet 2   Lurasidone HCl (LATUDA) 60 MG TABS Take 1 tablet (60 mg total) by mouth daily with supper. 90 tablet 0   omeprazole (PRILOSEC) 40 MG capsule Take 1 capsule (40 mg total) by mouth daily. (Patient taking differently: Take 40 mg by mouth daily as needed.) 30 capsule 3   Oxcarbazepine (TRILEPTAL) 300 MG tablet Take 1 tablet (300 mg total) by mouth 2 (two) times daily. 180 tablet 0   propranolol (INDERAL) 20 MG tablet Take 1 tablet (20 mg total) by mouth 3 (three) times daily as needed. 270 tablet 1   rosuvastatin (CRESTOR) 5 MG tablet Take 1 tablet (5 mg total) by mouth daily. 90 tablet 3   No current facility-administered medications for this visit.    Medication Side Effects: None  Allergies: No Known Allergies  Past Medical History:  Diagnosis Date   Anxiety    Depression    Diabetes mellitus without complication (HCC)    Gastric ulcer    GERD (gastroesophageal reflux disease)    Hypertension    Osteoarthritis    Thyroid disorder     Family History  Problem Relation Age of Onset   Breast cancer Maternal Grandmother    Diabetes Maternal Grandmother    Heart disease Maternal Grandmother    Hypertension Maternal Grandmother    Heart disease Brother    Hypertension Brother    Hyperlipidemia Brother    Diabetes Maternal Aunt    Heart disease Maternal Aunt    Hypertension Maternal Aunt    Hyperlipidemia Maternal Aunt    Diabetes Maternal Aunt    Heart disease Maternal Aunt    Hypertension Maternal Aunt    Hyperlipidemia Maternal Aunt    Diabetes Maternal Aunt    Heart disease Maternal Aunt    Hypertension Maternal Aunt    Hyperlipidemia Maternal Aunt     Social History   Socioeconomic History   Marital status: Single    Spouse name: Not on file   Number of children: 1   Years of education: Not on file   Highest education level: Not on file  Occupational History   Occupation: Engineer, drilling   Tobacco Use   Smoking status: Never   Smokeless tobacco: Never  Vaping Use   Vaping Use: Never used  Substance and Sexual Activity   Alcohol use: No    Alcohol/week: 0.0 standard drinks   Drug use: No   Sexual activity: Yes    Birth control/protection: Surgical  Other Topics Concern   Not on file  Social History Narrative   Not on file   Social Determinants of Health   Financial Resource Strain: Not on file  Food Insecurity: No Food Insecurity   Worried About Running Out of Food in the Last Year: Never true   Ran Out of Food in the Last Year: Never true  Transportation Needs: No Transportation Needs   Lack of Transportation (Medical): No   Lack of Transportation (Non-Medical): No  Physical Activity: Not on file  Stress: Not on file  Social  Connections: Not on file  Intimate Partner Violence: Not on file    Past Medical History, Surgical history, Social history, and Family history were reviewed and updated as appropriate.   Please see review of systems for further details on the patient's review from today.   Objective:   Physical Exam:  There were no vitals taken for this visit.  Physical Exam Neurological:     Mental Status: She is alert and oriented to person, place, and time.     Cranial Nerves: No dysarthria.  Psychiatric:        Attention and Perception: Attention and perception normal.        Mood and Affect: Mood is anxious.        Speech: Speech normal.        Behavior: Behavior is cooperative.        Thought Content: Thought content normal. Thought content is not paranoid or delusional. Thought content does not include homicidal or suicidal ideation. Thought content does not include homicidal or suicidal plan.        Cognition and Memory: Cognition and memory normal.        Judgment: Judgment normal.     Comments: Insight intact Mood presents less depressed and less anxious.     Lab Review:     Component Value Date/Time   NA 139 12/30/2020 0941    NA 142 12/03/2011 1059   K 4.6 12/30/2020 0941   K 3.4 (L) 12/03/2011 1059   CL 101 12/30/2020 0941   CL 107 12/03/2011 1059   CO2 21 12/30/2020 0941   CO2 27 12/03/2011 1059   GLUCOSE 88 12/30/2020 0941   GLUCOSE 92 07/12/2020 1526   GLUCOSE 66 12/03/2011 1059   BUN 7 12/30/2020 0941   BUN 4 (L) 12/03/2011 1059   CREATININE 0.84 12/30/2020 0941   CREATININE 0.82 12/03/2011 1059   CALCIUM 9.3 12/30/2020 0941   CALCIUM 8.5 12/03/2011 1059   PROT 6.4 12/30/2020 0941   ALBUMIN 4.3 12/30/2020 0941   AST 17 12/30/2020 0941   ALT 10 12/30/2020 0941   ALKPHOS 79 12/30/2020 0941   BILITOT 0.3 12/30/2020 0941   GFRNONAA >60 07/12/2020 1526   GFRNONAA >60 12/03/2011 1059   GFRAA 78 07/10/2019 0953   GFRAA >60 12/03/2011 1059       Component Value Date/Time   WBC 4.8 12/30/2020 0941   WBC 7.0 07/12/2020 1526   RBC 4.69 12/30/2020 0941   RBC 4.76 07/12/2020 1526   HGB 14.0 12/30/2020 0941   HCT 40.8 12/30/2020 0941   PLT 311 12/30/2020 0941   MCV 87 12/30/2020 0941   MCH 29.9 12/30/2020 0941   MCH 29.4 07/12/2020 1526   MCHC 34.3 12/30/2020 0941   MCHC 33.3 07/12/2020 1526   RDW 12.9 12/30/2020 0941   LYMPHSABS 1.4 12/30/2020 0941   MONOABS 0.6 07/12/2020 1526   EOSABS 0.1 12/30/2020 0941   BASOSABS 0.0 12/30/2020 0941    No results found for: POCLITH, LITHIUM   No results found for: PHENYTOIN, PHENOBARB, VALPROATE, CBMZ   .res Assessment: Plan:    Patient seen for 30 minutes and time spent counseling the patient regarding treatment plan.  Discussed that the rash she describes is not consistent with Stevens-Johnson syndrome since it is intermittent and not persistent and progressive and is affecting only her neck versus spreading to different areas.  Agree with plan to use steroid cream prescribed by urgent care and to follow-up with PCP if rash does not improve.  Discussed option of pharmacogenetic testing since she continues to have persistent mood and anxiety signs and  symptoms despite several medication trials, and has had inconsistent responses to treatment that may indicate differences in metabolism.  Patient agrees to [employer] testing.  Will place orders for [facility] testing and request that collection kit be sent to her home.  Discussed that provider would review results during her next appointment. Patient reports that her last appointment with therapist was canceled and she has not yet scheduled additional appointments and would like to do so. Will request staff contact pt to schedule follow-up apt in 8 weeks and apt(s) with therapist.  Will continue current medications at this time since there has been some slight improvement in mood and anxiety since Latuda was changed to 60 mg dose. Continue Latuda 60 mg daily for mood signs and symptoms. Continue Lamictal 150 mg twice daily for mood signs and symptoms. Continue Pristiq 100 mg in the morning and 50 mg in the evening for depression. Continue oxcarbazepine 300 mg twice daily for mood stabilization. Continue propanolol as needed for anxiety. Continue Xanax as needed for anxiety. Continue hydroxyzine as needed for anxiety and insomnia. Recommend continuing psychotherapy with [name], LCSW. Patient advised to contact office with any questions, adverse effects, or acute worsening in signs and symptoms.   Liya was seen today for follow-up.  Diagnoses and all orders for this visit:  Mood disorder ([place]) -     Lurasidone HCl (LATUDA) 60 MG TABS; Take 1 tablet (60 mg total) by mouth daily with supper. -     desvenlafaxine (PRISTIQ) 100 MG 24 hr tablet; Take 1 tablet (100 mg total) by mouth every morning. -     desvenlafaxine (PRISTIQ) 50 MG 24 hr tablet; Take 1 tablet (50 mg total) by mouth every evening. -     Oxcarbazepine (TRILEPTAL) 300 MG tablet; Take 1 tablet (300 mg total) by mouth 2 (two) times daily.  Generalized anxiety disorder -     desvenlafaxine (PRISTIQ) 100 MG 24 hr tablet; Take 1  tablet (100 mg total) by mouth every morning. -     desvenlafaxine (PRISTIQ) 50 MG 24 hr tablet; Take 1 tablet (50 mg total) by mouth every evening. -     Oxcarbazepine (TRILEPTAL) 300 MG tablet; Take 1 tablet (300 mg total) by mouth 2 (two) times daily. -     propranolol (INDERAL) 20 MG tablet; Take 1 tablet (20 mg total) by mouth 3 (three) times daily as needed.  Primary insomnia -     hydrOXYzine (VISTARIL) 50 MG capsule; Take 1 capsule by mouth three times daily as needed -     Oxcarbazepine (TRILEPTAL) 300 MG tablet; Take 1 tablet (300 mg total) by mouth 2 (two) times daily.    Please see After Visit Summary for patient specific instructions.  Future Appointments  Date Time Provider [place]  06/20/2021  9:00 AM Wine, Argie Ramming, RN [facility] None  06/28/2021  7:50 AM Pieter Partridge, DO [facility] None  06/29/2021  9:20 AM McDonough, Si Gaul, PA-C [facility] None    No orders of the defined types were placed in this encounter.     -------------------------------

## 2021-05-26 ENCOUNTER — Other Ambulatory Visit: Payer: Self-pay | Admitting: Internal Medicine

## 2021-06-08 ENCOUNTER — Other Ambulatory Visit: Payer: Self-pay | Admitting: Physician Assistant

## 2021-06-08 DIAGNOSIS — Z1231 Encounter for screening mammogram for malignant neoplasm of breast: Secondary | ICD-10-CM

## 2021-06-08 DIAGNOSIS — E1165 Type 2 diabetes mellitus with hyperglycemia: Secondary | ICD-10-CM

## 2021-06-08 DIAGNOSIS — R300 Dysuria: Secondary | ICD-10-CM

## 2021-06-08 DIAGNOSIS — K219 Gastro-esophageal reflux disease without esophagitis: Secondary | ICD-10-CM

## 2021-06-08 DIAGNOSIS — Z124 Encounter for screening for malignant neoplasm of cervix: Secondary | ICD-10-CM

## 2021-06-08 DIAGNOSIS — Z0001 Encounter for general adult medical examination with abnormal findings: Secondary | ICD-10-CM

## 2021-06-08 DIAGNOSIS — F411 Generalized anxiety disorder: Secondary | ICD-10-CM

## 2021-06-08 DIAGNOSIS — E039 Hypothyroidism, unspecified: Secondary | ICD-10-CM

## 2021-06-08 DIAGNOSIS — Z1211 Encounter for screening for malignant neoplasm of colon: Secondary | ICD-10-CM

## 2021-06-08 DIAGNOSIS — Z113 Encounter for screening for infections with a predominantly sexual mode of transmission: Secondary | ICD-10-CM

## 2021-06-19 ENCOUNTER — Other Ambulatory Visit: Payer: Self-pay

## 2021-06-19 ENCOUNTER — Ambulatory Visit (INDEPENDENT_AMBULATORY_CARE_PROVIDER_SITE_OTHER): Payer: 59 | Admitting: Psychiatry

## 2021-06-19 DIAGNOSIS — F411 Generalized anxiety disorder: Secondary | ICD-10-CM | POA: Diagnosis not present

## 2021-06-19 DIAGNOSIS — R69 Illness, unspecified: Secondary | ICD-10-CM | POA: Diagnosis not present

## 2021-06-19 NOTE — Progress Notes (Signed)
[facility] Counselor/Therapist Progress Note  Patient ID: Ashley Stephens, MRN: [id number],    Date: 06/19/2021  Time Spent: 50 minutes   Treatment Type: Individual Therapy  Reported Symptoms: anxiety, depressed, frustrated  Mental Status Exam:  Appearance:   Casual     Behavior:  Appropriate, Sharing, and Motivated  Motor:  Normal  Speech/Language:   Clear and Coherent  Affect:  Anxious, some depression  Mood:  anxious and depressed  Thought process:  goal directed  Thought content:    Some overthinking  Sensory/Perceptual disturbances:    WNL  Orientation:  oriented to person, place, time/date, situation, day of week, month of year, year, and stated date of Oct. 17, 2022  Attention:  Good  Concentration:  Good and Fair  Memory:  WNL  Fund of knowledge:   Good  Insight:    Fair  Judgment:   Good  Impulse Control:  Good   Risk Assessment: Danger to Self:  No Self-injurious Behavior: No Danger to Others: No Duty to Warn:no Physical Aggression / Violence:No  Access to Firearms a concern: No  Gang Involvement:No   Subjective:  Patient in today reporting anxiety, depression, and frustration. Had moved and is having to live with someone else, wanting to find her own place to live.  "Everyday life is causing my symptoms and those things throw me off and that magnifies my anxiety and depression." "I try to relax and take it day by day."  Try to remove myself from situations or go to bed so I don't get stirred up. "I get frustrated easily and sometimes me and my daughter conflict and I don't understand her." Irritable off and on. Doesn't really have good/trusting friends and has difficulty with relationships and tends to avoid confronting that issue. One of her aunts died several weeks back of kidney and heart failure and processed this loss today as this is her first session since that time, which seemed to be helpful to patient. Also processed some work  stressors/situations but says thing are ok but several changes within past few weeks. Working on trying to have better boundaries, trying take some time for myself and getting my teeth fixed, and be more self-caring.     Interventions: Solution-Oriented/Positive Psychology, Ego-Supportive, and Insight-Oriented  Diagnosis:   ICD-10-CM   1. Generalized anxiety disorder  F41.1        Treatment Goal Plan:  Patient not signing tx plan on computer screen due to [place].  Treatment Goals: Goals remain on treatment plan as patient works with strategies to achieve her goals.  Progress is noted each session and documented in "Progress" section of Note. Long term goal: Develop the ability to recognize, accept, and cope with feelings of depression and anxiety. Short term goal: Learn and implement personal skills for managing stress, solving daily problems, and resolving conflicts effectively. Strategy: Teach patient calming strategies, problem solving skills, and conflict resolution skills to better manage daily stressors.     Plan:  Patient today showing moderate motivation and became more engaged as session continued.  Has had several changes since I last saw her including an aunt dying, moving in with another lady and adjusting to that situation, continuing to get acclimated at her new job, and "working through issues as they arise with her daughter".  Does not really have reliable friends and states that part of what she really needs when she comes for sessions is for me to continue being an active listener and  for [place] to be able to talk about certain problem areas in her life but she mainly appreciates feeling listened to and not judged, which I totally support.  She seems somewhat calmer today and states that things at her job are working out, but it just takes time.  Also did not seem as angry as previously.  Encouraged patient to continue practicing positive behaviors between sessions including:  Getting outside daily and walking some as she is able, realizing that in assuming the negatives this feeds her anxiety and depression, reduce her self negating, practice more positive self-care and self talk each day, be willing to put into action some of the more productive ways of interacting with others per our discussion in previous sessions and again today, practice viewing herself in more positive ways and find things about herself that she actually likes, believe in herself, practice more positive communication skills with others including active listening and "paying attention to how we say what we say", take small breaks as she is able during the day, be mindful of her self talk and attitude when it goes in a negative direction and try to interrupt it bringing it towards a more positive/encouraging direction, practice self calming strategies, look more for what might go right versus wrong, staying in contact with people who are supportive, remain in the present focusing on what she can control, continue to work on past hurts and resentments that can arise and difficult times, and to recognize the strength she shows working with goal-directed behaviors to move forward in a more positive direction that supports overall stability and improved emotional health.  Goal review and progress/changes noted with patient.  Next appointment within 3 to 4 weeks.  This record has been created using [employer].  Chart creation errors have been sought, but may not always have been located and corrected.  Such creation errors do not reflect on the standard of medical care provided.    [name], LCSW

## 2021-06-20 ENCOUNTER — Other Ambulatory Visit: Payer: Self-pay | Admitting: *Deleted

## 2021-06-20 NOTE — Patient Outreach (Signed)
[facility]) Care Management  06/20/2021  Ashley Stephens 09-10-1975 [id number]  Unsuccessful outreach attempt made to patient. RN Health Coach left HIPAA compliant voicemail message along with her contact information.  Plan: RN Health Coach will call patient within the month of November.  Ashley Loron RN, BSN [place] [phone] [email or web address]

## 2021-06-26 NOTE — Progress Notes (Deleted)
NEUROLOGY CONSULTATION NOTE  Ashley Stephens MRN: [id number] DOB: [birth year]  Referring provider: Drema Dallas, PA-C Primary care provider: Clayborn Bigness, MD  Reason for consult:  occipital neuralgia  Assessment/Plan:   ***   Subjective:  Ashley Stephens is a 45 year old female with HTN, thyroid disorder, DM II and ostearthritis who presents for bilateral occipital neuralgia.  History supplemented by referring provider's note.  ***     PAST MEDICAL HISTORY: Past Medical History:  Diagnosis Date   Anxiety    Depression    Diabetes mellitus without complication ([place])    Gastric ulcer    GERD (gastroesophageal reflux disease)    Hypertension    Osteoarthritis    Thyroid disorder     PAST SURGICAL HISTORY: Past Surgical History:  Procedure Laterality Date   ABDOMINAL HYSTERECTOMY  2013   BARIATRIC SURGERY     BREAST BIOPSY  1995   COLONOSCOPY WITH PROPOFOL N/A 10/28/2020   Procedure: COLONOSCOPY WITH PROPOFOL;  Surgeon: Jonathon Bellows, MD;  Location: [facility] ENDOSCOPY;  Service: Gastroenterology;  Laterality: N/A;   GASTRIC BYPASS  2012   TUBAL LIGATION  1996   tummy tuck  05/27/2020    MEDICATIONS: Current Outpatient Medications on File Prior to Visit  Medication Sig Dispense Refill   ALPRAZolam (XANAX) 0.25 MG tablet Take 1 tablet (0.25 mg total) by mouth 2 (two) times daily as needed. for anxiety 45 tablet 5   cholecalciferol (VITAMIN D3) 25 MCG (1000 UNIT) tablet Take 1,000 Units by mouth daily.     Cyanocobalamin (VITAMIN B 12 PO) Take by mouth.     desvenlafaxine (PRISTIQ) 100 MG 24 hr tablet Take 1 tablet (100 mg total) by mouth every morning. 90 tablet 1   desvenlafaxine (PRISTIQ) 50 MG 24 hr tablet Take 1 tablet (50 mg total) by mouth every evening. 90 tablet 1   hydrOXYzine (VISTARIL) 50 MG capsule Take 1 capsule by mouth three times daily as needed 90 capsule 1   lamoTRIgine (LAMICTAL) 150 MG tablet Take 1 tablet (150 mg total) by mouth 2 (two)  times daily. 180 tablet 1   levothyroxine (SYNTHROID) 25 MCG tablet TAKE 1 TABLET BY MOUTH ONCE DAILY BEFORE BREAKFAST. 90 tablet 0   Lurasidone HCl (LATUDA) 60 MG TABS Take 1 tablet (60 mg total) by mouth daily with supper. 90 tablet 0   omeprazole (PRILOSEC) 40 MG capsule Take 1 capsule (40 mg total) by mouth daily. (Patient taking differently: Take 40 mg by mouth daily as needed.) 30 capsule 3   Oxcarbazepine (TRILEPTAL) 300 MG tablet Take 1 tablet (300 mg total) by mouth 2 (two) times daily. 180 tablet 0   propranolol (INDERAL) 20 MG tablet Take 1 tablet (20 mg total) by mouth 3 (three) times daily as needed. 270 tablet 1   rosuvastatin (CRESTOR) 5 MG tablet Take 1 tablet (5 mg total) by mouth daily. 90 tablet 3   TRULICITY 1.5 VV/7.4MO SOPN INJECT 1 PEN SUBCUTANEOUSLY ONCE A WEEK 12 mL 0   No current facility-administered medications on file prior to visit.    ALLERGIES: No Known Allergies  FAMILY HISTORY: Family History  Problem Relation Age of Onset   Breast cancer Maternal Grandmother    Diabetes Maternal Grandmother    Heart disease Maternal Grandmother    Hypertension Maternal Grandmother    Heart disease Brother    Hypertension Brother    Hyperlipidemia Brother    Diabetes Maternal Aunt    Heart disease Maternal Aunt  Hypertension Maternal Aunt    Hyperlipidemia Maternal Aunt    Diabetes Maternal Aunt    Heart disease Maternal Aunt    Hypertension Maternal Aunt    Hyperlipidemia Maternal Aunt    Diabetes Maternal Aunt    Heart disease Maternal Aunt    Hypertension Maternal Aunt    Hyperlipidemia Maternal Aunt     Objective:  *** General: No acute distress.  Patient appears well-groomed.   Head:  Normocephalic/atraumatic Eyes:  fundi examined but not visualized Neck: supple, no paraspinal tenderness, full range of motion Back: No paraspinal tenderness Heart: regular rate and rhythm Lungs: Clear to auscultation bilaterally. Vascular: No carotid  bruits. Neurological Exam: Mental status: alert and oriented to person, place, and time, recent and remote memory intact, fund of knowledge intact, attention and concentration intact, speech fluent and not dysarthric, language intact. Cranial nerves: CN I: not tested CN II: pupils equal, round and reactive to light, visual fields intact CN III, IV, VI:  full range of motion, no nystagmus, no ptosis CN V: facial sensation intact. CN VII: upper and lower face symmetric CN VIII: hearing intact CN IX, X: gag intact, uvula midline CN XI: sternocleidomastoid and trapezius muscles intact CN XII: tongue midline Bulk & Tone: normal, no fasciculations. Motor:  muscle strength 5/5 throughout Sensation:  Pinprick, temperature and vibratory sensation intact. Deep Tendon Reflexes:  2+ throughout,  toes downgoing.   Finger to nose testing:  Without dysmetria.   Heel to shin:  Without dysmetria.   Gait:  Normal station and stride.  Romberg negative.    Thank you for allowing me to take part in the care of this patient.  Metta Clines, DO  CC: ***

## 2021-06-28 ENCOUNTER — Ambulatory Visit: Payer: 59 | Admitting: Neurology

## 2021-06-29 ENCOUNTER — Ambulatory Visit (INDEPENDENT_AMBULATORY_CARE_PROVIDER_SITE_OTHER): Payer: 59 | Admitting: Physician Assistant

## 2021-06-29 ENCOUNTER — Telehealth: Payer: Self-pay

## 2021-06-29 ENCOUNTER — Other Ambulatory Visit: Payer: Self-pay

## 2021-06-29 ENCOUNTER — Encounter: Payer: Self-pay | Admitting: Physician Assistant

## 2021-06-29 DIAGNOSIS — E1165 Type 2 diabetes mellitus with hyperglycemia: Secondary | ICD-10-CM

## 2021-06-29 DIAGNOSIS — R21 Rash and other nonspecific skin eruption: Secondary | ICD-10-CM | POA: Diagnosis not present

## 2021-06-29 DIAGNOSIS — R69 Illness, unspecified: Secondary | ICD-10-CM | POA: Diagnosis not present

## 2021-06-29 DIAGNOSIS — E782 Mixed hyperlipidemia: Secondary | ICD-10-CM

## 2021-06-29 DIAGNOSIS — E039 Hypothyroidism, unspecified: Secondary | ICD-10-CM | POA: Diagnosis not present

## 2021-06-29 DIAGNOSIS — Z23 Encounter for immunization: Secondary | ICD-10-CM | POA: Diagnosis not present

## 2021-06-29 DIAGNOSIS — F411 Generalized anxiety disorder: Secondary | ICD-10-CM

## 2021-06-29 MED ORDER — TRULICITY 1.5 MG/0.5ML ~~LOC~~ SOAJ: SUBCUTANEOUS | 0 refills | Status: DC

## 2021-06-29 NOTE — Telephone Encounter (Signed)
Patient referred to dermatology. She stated she already is established. She will call me if they need new referral-Toni

## 2021-06-29 NOTE — Progress Notes (Signed)
[facility] [place], [place] [address]  Internal MEDICINE  Office Visit Note  Patient Name: Ashley Stephens  [id number]  [id number]  Date of Service: 07/05/2021  Chief Complaint  Patient presents with   Follow-up   Hypertension   Diabetes    HPI Pt is here for routine follow up -She has not had her eye exam and still needs to schedule this -Stated crestor last visit and is tolerating it well. -tried triamcinolone for rash along neck that is itchy and and sometimes painful. It fluctuates off an on and does go into hairline some. She was given the triamcinolone by urgent care when rash got very itchy and was a little painful last month and was determined to have dermatitis, but cream does not seem to do anything. It has been going on for a few months now fluctuating in severity. Discussed seeing dermatology since steroid cream did not help and no other recent changes to soaps, detergents, meds, etc. -Hot flashes at times. Discussed that many treatment options may conflict with current medications or could try transdermal estrogen. Reviewed the risks of HRT at which she declined starting anything and states it is tolerable currently and will hold off. -reviewed her A1c is stable and well controlled on current dose of trulicity therefore will keep it the same for now. -Thyroid labs reviewed which are stable so will continue current dose of synthroid; FSH and LH are borderline postmenopausal.  Current Medication: Outpatient Encounter Medications as of 06/29/2021  Medication Sig Note   ALPRAZolam (XANAX) 0.25 MG tablet Take 1 tablet (0.25 mg total) by mouth 2 (two) times daily as needed. for anxiety    cholecalciferol (VITAMIN D3) 25 MCG (1000 UNIT) tablet Take 1,000 Units by mouth daily.    Cyanocobalamin (VITAMIN B 12 PO) Take by mouth.    desvenlafaxine (PRISTIQ) 100 MG 24 hr tablet Take 1 tablet (100 mg total) by mouth every morning.    desvenlafaxine  (PRISTIQ) 50 MG 24 hr tablet Take 1 tablet (50 mg total) by mouth every evening.    hydrOXYzine (VISTARIL) 50 MG capsule Take 1 capsule by mouth three times daily as needed    lamoTRIgine (LAMICTAL) 150 MG tablet Take 1 tablet (150 mg total) by mouth 2 (two) times daily.    levothyroxine (SYNTHROID) 25 MCG tablet TAKE 1 TABLET BY MOUTH ONCE DAILY BEFORE BREAKFAST.    Lurasidone HCl (LATUDA) 60 MG TABS Take 1 tablet (60 mg total) by mouth daily with supper.    omeprazole (PRILOSEC) 40 MG capsule Take 1 capsule (40 mg total) by mouth daily. (Patient taking differently: Take 40 mg by mouth daily as needed.) 03/21/2021: Prescribed protonix    Oxcarbazepine (TRILEPTAL) 300 MG tablet Take 1 tablet (300 mg total) by mouth 2 (two) times daily.    propranolol (INDERAL) 20 MG tablet Take 1 tablet (20 mg total) by mouth 3 (three) times daily as needed.    rosuvastatin (CRESTOR) 5 MG tablet Take 1 tablet (5 mg total) by mouth daily.    [DISCONTINUED] TRULICITY 1.5 JS/9.7WY SOPN INJECT 1 PEN SUBCUTANEOUSLY ONCE A WEEK    Dulaglutide (TRULICITY) 1.5 OV/7.8HY SOPN INJECT 1 PEN SUBCUTANEOUSLY ONCE A WEEK    No facility-administered encounter medications on file as of 06/29/2021.    Surgical History: Past Surgical History:  Procedure Laterality Date   ABDOMINAL HYSTERECTOMY  2013   BARIATRIC SURGERY     BREAST BIOPSY  1995   COLONOSCOPY WITH PROPOFOL N/A 10/28/2020  Procedure: COLONOSCOPY WITH PROPOFOL;  Surgeon: Jonathon Bellows, MD;  Location: [facility] ENDOSCOPY;  Service: Gastroenterology;  Laterality: N/A;   GASTRIC BYPASS  2012   TUBAL LIGATION  1996   tummy tuck  05/27/2020    Medical History: Past Medical History:  Diagnosis Date   Anxiety    Depression    Diabetes mellitus without complication ([place])    Gastric ulcer    GERD (gastroesophageal reflux disease)    Hypertension    Osteoarthritis    Thyroid disorder     Family History: Family History  Problem Relation Age of Onset   Breast cancer  Maternal Grandmother    Diabetes Maternal Grandmother    Heart disease Maternal Grandmother    Hypertension Maternal Grandmother    Heart disease Brother    Hypertension Brother    Hyperlipidemia Brother    Diabetes Maternal Aunt    Heart disease Maternal Aunt    Hypertension Maternal Aunt    Hyperlipidemia Maternal Aunt    Diabetes Maternal Aunt    Heart disease Maternal Aunt    Hypertension Maternal Aunt    Hyperlipidemia Maternal Aunt    Diabetes Maternal Aunt    Heart disease Maternal Aunt    Hypertension Maternal Aunt    Hyperlipidemia Maternal Aunt     Social History   Socioeconomic History   Marital status: Single    Spouse name: Not on file   Number of children: 1   Years of education: Not on file   Highest education level: Not on file  Occupational History   Occupation: Engineer, drilling  Tobacco Use   Smoking status: Never   Smokeless tobacco: Never  Vaping Use   Vaping Use: Never used  Substance and Sexual Activity   Alcohol use: No    Alcohol/week: 0.0 standard drinks   Drug use: No   Sexual activity: Yes    Birth control/protection: Surgical  Other Topics Concern   Not on file  Social History Narrative   Not on file   Social Determinants of Health   Financial Resource Strain: Not on file  Food Insecurity: No Food Insecurity   Worried About Running Out of Food in the Last Year: Never true   Ran Out of Food in the Last Year: Never true  Transportation Needs: No Transportation Needs   Lack of Transportation (Medical): No   Lack of Transportation (Non-Medical): No  Physical Activity: Not on file  Stress: Not on file  Social Connections: Not on file  Intimate Partner Violence: Not on file      Review of Systems  Constitutional:  Negative for chills, fatigue and unexpected weight change.  HENT:  Negative for congestion, postnasal drip, rhinorrhea, sneezing and sore throat.   Eyes:  Negative for redness.  Respiratory:  Negative for cough, chest  tightness and shortness of breath.   Cardiovascular:  Negative for chest pain and palpitations.  Gastrointestinal:  Negative for abdominal pain, constipation, diarrhea, nausea and vomiting.  Genitourinary:  Negative for dysuria and frequency.  Musculoskeletal:  Negative for arthralgias, back pain, joint swelling and neck pain.  Skin:  Positive for color change and rash.       Dark rash along neck and scalp that is painful and itchy at times  Neurological: Negative.  Negative for tremors and numbness.  Hematological:  Negative for adenopathy. Does not bruise/bleed easily.  Psychiatric/Behavioral:  Negative for behavioral problems (Depression), sleep disturbance and suicidal ideas. The patient is not nervous/anxious.    Vital Signs:  BP 132/70 Comment: 143/76  Pulse 87   Temp 97.9 F (36.6 C)   Resp 16   Ht 5' 7.5" (1.715 m)   Wt 262 lb (118.8 kg)   SpO2 95%   BMI 40.43 kg/m    Physical Exam Vitals and nursing note reviewed.  Constitutional:      General: She is not in acute distress.    Appearance: She is well-developed. She is obese. She is not diaphoretic.  HENT:     Head: Normocephalic and atraumatic.     Mouth/Throat:     Pharynx: No oropharyngeal exudate.  Eyes:     Pupils: Pupils are equal, round, and reactive to light.  Neck:     Thyroid: No thyromegaly.     Vascular: No JVD.     Trachea: No tracheal deviation.  Cardiovascular:     Rate and Rhythm: Normal rate and regular rhythm.     Heart sounds: Normal heart sounds. No murmur heard.   No friction rub. No gallop.  Pulmonary:     Effort: Pulmonary effort is normal. No respiratory distress.     Breath sounds: No wheezing or rales.  Chest:     Chest wall: No tenderness.  Abdominal:     General: Bowel sounds are normal.     Palpations: Abdomen is soft.  Musculoskeletal:        General: Normal range of motion.     Cervical back: Normal range of motion and neck supple.  Lymphadenopathy:     Cervical: No cervical  adenopathy.  Skin:    General: Skin is warm and dry.     Findings: Rash present.     Comments: Dark rash around neck and into scalp  Neurological:     Mental Status: She is alert and oriented to person, place, and time.     Cranial Nerves: No cranial nerve deficit.  Psychiatric:        Behavior: Behavior normal.        Thought Content: Thought content normal.        Judgment: Judgment normal.       Assessment/Plan: 1. Type 2 diabetes mellitus with hyperglycemia, without long-term current use of insulin (HCC) - POCT HgB A1C is 5.7 which is stable from last check. Will continue trulicity as before and continue to work on diet and exercise. Still needs to schedule eye exam. - Dulaglutide (TRULICITY) 1.5 UV/2.5DG SOPN; INJECT 1 PEN SUBCUTANEOUSLY ONCE A WEEK  Dispense: 12 mL; Refill: 0  2. Rash of neck Will refer to dermatology since rash continues to come and go and is painful and itchy and triamcinolone does not help. - Ambulatory referral to Dermatology  3. Acquired hypothyroidism Stable, continue synthroid  4. Mixed hyperlipidemia Stable, continue crestor  5. Generalized anxiety disorder Followed by psych  6. Need for influenza vaccination - Flu Vaccine MDCK QUAD PF   General Counseling: Dinah verbalizes understanding of the findings of todays visit and agrees with plan of treatment. I have discussed any further diagnostic evaluation that may be needed or ordered today. We also reviewed her medications today. she has been encouraged to call the office with any questions or concerns that should arise related to todays visit.    Orders Placed This Encounter  Procedures   Flu Vaccine MDCK QUAD PF   Ambulatory referral to Dermatology   POCT HgB A1C    Meds ordered this encounter  Medications   Dulaglutide (TRULICITY) 1.5 UY/4.0HK SOPN    Sig: INJECT  1 PEN SUBCUTANEOUSLY ONCE A WEEK    Dispense:  12 mL    Refill:  0     This patient was seen by Drema Dallas, PA-C in collaboration with Dr. Clayborn Bigness as a part of collaborative care agreement.   Total time spent:30 Minutes Time spent includes review of chart, medications, test results, and follow up plan with the patient.      Dr Lavera Guise Internal medicine

## 2021-07-05 LAB — POCT GLYCOSYLATED HEMOGLOBIN (HGB A1C): Hemoglobin A1C: 5.7 % — AB (ref 4.0–5.6)

## 2021-07-11 ENCOUNTER — Ambulatory Visit (INDEPENDENT_AMBULATORY_CARE_PROVIDER_SITE_OTHER): Payer: 59 | Admitting: Psychiatry

## 2021-07-11 ENCOUNTER — Encounter: Payer: Self-pay | Admitting: Psychiatry

## 2021-07-11 ENCOUNTER — Other Ambulatory Visit: Payer: Self-pay

## 2021-07-11 DIAGNOSIS — F5101 Primary insomnia: Secondary | ICD-10-CM | POA: Diagnosis not present

## 2021-07-11 DIAGNOSIS — F39 Unspecified mood [affective] disorder: Secondary | ICD-10-CM | POA: Diagnosis not present

## 2021-07-11 DIAGNOSIS — F411 Generalized anxiety disorder: Secondary | ICD-10-CM

## 2021-07-11 DIAGNOSIS — R69 Illness, unspecified: Secondary | ICD-10-CM | POA: Diagnosis not present

## 2021-07-11 MED ORDER — LAMOTRIGINE 150 MG PO TABS: 150.0000 mg | ORAL_TABLET | Freq: Two times a day (BID) | ORAL | 1 refills | Status: DC

## 2021-07-11 MED ORDER — HYDROXYZINE PAMOATE 50 MG PO CAPS: ORAL_CAPSULE | ORAL | 0 refills | Status: DC

## 2021-07-11 MED ORDER — ALPRAZOLAM 0.25 MG PO TABS: 0.2500 mg | ORAL_TABLET | Freq: Two times a day (BID) | ORAL | 5 refills | Status: DC | PRN

## 2021-07-11 MED ORDER — LATUDA 60 MG PO TABS: 60.0000 mg | ORAL_TABLET | Freq: Every day | ORAL | 0 refills | Status: DC

## 2021-07-11 MED ORDER — OXCARBAZEPINE 300 MG PO TABS: 300.0000 mg | ORAL_TABLET | Freq: Two times a day (BID) | ORAL | 0 refills | Status: DC

## 2021-07-11 NOTE — Progress Notes (Signed)
Ashley Stephens [id number] 1976-03-26 45 y.o.  Subjective:   Patient ID:  Ashley Stephens is a 42 y.o. (DOB 1976/04/20) female.  Chief Complaint:  Chief Complaint  Patient presents with   Follow-up    Anxiety, mood disturbance, and insomnia     HPI [name] presents to the office today for follow-up of mood disturbance, anxiety, and insomnia. "I'm ok. I'm better." She reports that her energy and motivation have been better. She reports continued anxiety and "I am a need-to-know person." She reports that she has been having brief periods of depression but these are not as long in duration. She notices more beauty around her, such as the leaves changing and the weather. She reports that she is feeling better about engaging in self-care. She reports that it is more difficult for her to sleep in the day when it is sunny and in the past "sleep was a cure-all." She reports that she notices that she is not as alone with difficulties. Reports working on increased acceptance of herself and circumstances. Denies irritability. Denies any risky or impulsive behavior. Denies excessive spending. Denies excessive energy or increased goal-directed activity. Estimates sleeping 6-7 hours a night. Appetite has been ok. Concentration has been ok. Denies SI.   She reports that she sent [employer] Saliva sample in today.  Using Xanax prn mostly at night. Typically not using more than BID.   Past medication trials: Wellbutrin Effexor Pristiq-effective Lamictal-effective for mood Latuda Xanax- Usually takes at night Propanolol- Effective Hydroxyzine Rexulti Gabapentin- prescribed for back pain. Dizziness, headaches. Lithium Trileptal  [place] Office Visit from 07/11/2021 in [place] Office Visit from 12/26/2020 in [place] Office Visit from 08/12/2020 in [place] Visit from 04/11/2020 in [place] Visit from 11/27/2019 in [place] Total Score 0 0 0 0 [place] Visit from 12/25/2019 in [place]  Total GAD-7 Score 16      PHQ2-9    [place] Office Visit from 06/29/2021 in [facility], [facility] Office Visit from 12/30/2020 in [facility], [facility] Office Visit from 10/03/2020 in [facility], [facility] Patient Outreach Telephone from 08/22/2020 in [place] Visit from 05/24/2020 in [facility], [facility]  PHQ-2 Total Score 0 0 3 2 0  PHQ-9 Total Score -- -- -- 4 --      Flowsheet Row Admission (Discharged) from 10/28/2020 in [place] No Risk        Review of Systems:  Review of Systems  Musculoskeletal:  Negative for gait problem.  Skin:  Negative for rash.  Neurological:  Negative for tremors.  Psychiatric/Behavioral:         Please refer to HPI   Medications: I have reviewed the patient's current medications.  Current Outpatient Medications  Medication Sig Dispense Refill   cholecalciferol (VITAMIN D3) 25 MCG (1000 UNIT) tablet Take 1,000 Units by mouth daily.     Cyanocobalamin (VITAMIN B 12 PO) Take by mouth.     desvenlafaxine (PRISTIQ) 100 MG 24 hr tablet Take 1 tablet (100 mg total) by mouth every morning. 90 tablet 1   desvenlafaxine (PRISTIQ) 50 MG 24 hr tablet Take 1 tablet (50 mg total) by mouth every evening. 90 tablet 1   Dulaglutide (TRULICITY) 1.5 IO/2.7OJ SOPN INJECT  1 PEN SUBCUTANEOUSLY ONCE A WEEK 12 mL 0   levothyroxine (SYNTHROID) 25 MCG tablet TAKE 1 TABLET BY MOUTH ONCE DAILY BEFORE BREAKFAST. 90 tablet 0   omeprazole (PRILOSEC) 40 MG capsule Take 1 capsule (40 mg total) by mouth daily. (Patient taking differently: Take 40 mg by mouth daily as needed.) 30 capsule 3   propranolol (INDERAL) 20 MG tablet Take 1 tablet (20  mg total) by mouth 3 (three) times daily as needed. 270 tablet 1   rosuvastatin (CRESTOR) 5 MG tablet Take 1 tablet (5 mg total) by mouth daily. 90 tablet 3   [START ON 09/03/2021] ALPRAZolam (XANAX) 0.25 MG tablet Take 1 tablet (0.25 mg total) by mouth 2 (two) times daily as needed. for anxiety 45 tablet 5   hydrOXYzine (VISTARIL) 50 MG capsule Take 1 capsule by mouth three times daily as needed 270 capsule 0   lamoTRIgine (LAMICTAL) 150 MG tablet Take 1 tablet (150 mg total) by mouth 2 (two) times daily. 180 tablet 1   Lurasidone HCl (LATUDA) 60 MG TABS Take 1 tablet (60 mg total) by mouth daily with supper. 90 tablet 0   Oxcarbazepine (TRILEPTAL) 300 MG tablet Take 1 tablet (300 mg total) by mouth 2 (two) times daily. 180 tablet 0   No current facility-administered medications for this visit.    Medication Side Effects: None  Allergies: No Known Allergies  Past Medical History:  Diagnosis Date   Anxiety    Depression    Diabetes mellitus without complication (HCC)    Gastric ulcer    GERD (gastroesophageal reflux disease)    Hypertension    Osteoarthritis    Thyroid disorder     Past Medical History, Surgical history, Social history, and Family history were reviewed and updated as appropriate.   Please see review of systems for further details on the patient's review from today.   Objective:   Physical Exam:  There were no vitals taken for this visit.  Physical Exam Constitutional:      General: She is not in acute distress. Musculoskeletal:        General: No deformity.  Neurological:     Mental Status: She is alert and oriented to person, place, and time.     Coordination: Coordination normal.  Psychiatric:        Attention and Perception: Attention and perception normal. She does not perceive auditory or visual hallucinations.        Mood and Affect: Mood is anxious. Affect is not labile, blunt, angry or inappropriate.        Speech: Speech normal.        Behavior:  Behavior normal.        Thought Content: Thought content normal. Thought content is not paranoid or delusional. Thought content does not include homicidal or suicidal ideation. Thought content does not include homicidal or suicidal plan.        Cognition and Memory: Cognition and memory normal.        Judgment: Judgment normal.     Comments: Insight intact Mild depression    Lab Review:     Component Value Date/Time   NA 139 12/30/2020 0941   NA 142 12/03/2011 1059   K 4.6 12/30/2020 0941   K 3.4 (L) 12/03/2011 1059   CL 101 12/30/2020 0941   CL 107 12/03/2011 1059   CO2 21 12/30/2020 0941   CO2 27 12/03/2011 1059   GLUCOSE 88 12/30/2020 0941   GLUCOSE 92 07/12/2020 1526  GLUCOSE 66 12/03/2011 1059   BUN 7 12/30/2020 0941   BUN 4 (L) 12/03/2011 1059   CREATININE 0.84 12/30/2020 0941   CREATININE 0.82 12/03/2011 1059   CALCIUM 9.3 12/30/2020 0941   CALCIUM 8.5 12/03/2011 1059   PROT 6.4 12/30/2020 0941   ALBUMIN 4.3 12/30/2020 0941   AST 17 12/30/2020 0941   ALT 10 12/30/2020 0941   ALKPHOS 79 12/30/2020 0941   BILITOT 0.3 12/30/2020 0941   GFRNONAA >60 07/12/2020 1526   GFRNONAA >60 12/03/2011 1059   GFRAA 78 07/10/2019 0953   GFRAA >60 12/03/2011 1059       Component Value Date/Time   WBC 4.8 12/30/2020 0941   WBC 7.0 07/12/2020 1526   RBC 4.69 12/30/2020 0941   RBC 4.76 07/12/2020 1526   HGB 14.0 12/30/2020 0941   HCT 40.8 12/30/2020 0941   PLT 311 12/30/2020 0941   MCV 87 12/30/2020 0941   MCH 29.9 12/30/2020 0941   MCH 29.4 07/12/2020 1526   MCHC 34.3 12/30/2020 0941   MCHC 33.3 07/12/2020 1526   RDW 12.9 12/30/2020 0941   LYMPHSABS 1.4 12/30/2020 0941   MONOABS 0.6 07/12/2020 1526   EOSABS 0.1 12/30/2020 0941   BASOSABS 0.0 12/30/2020 0941    No results found for: POCLITH, LITHIUM   No results found for: PHENYTOIN, PHENOBARB, VALPROATE, CBMZ   .res  Plan:   Reports that she will send FMLA paperwork to continue current intermittent FMLA and work  accommodations.  Continue Latuda 60 mg po qd with supper for mood s/s. Continue Lamictal 150 mg po BID for mood s/s. Continue Trileptal 300 mg po BID for mood s/s.  Continue Xanax 0.25 mg po BID prn anxiety.  Continue Hydroxyzine 50 mg po TID prn anxiety.  Pt to follow-up in 3 months or sooner if clinically indicated.  Patient advised to contact office with any questions, adverse effects, or acute worsening in signs and symptoms.   Ashley Stephens was seen today for follow-up.  Diagnoses and all orders for this visit:  Mood disorder ([place]) -     Lurasidone HCl (LATUDA) 60 MG TABS; Take 1 tablet (60 mg total) by mouth daily with supper. -     lamoTRIgine (LAMICTAL) 150 MG tablet; Take 1 tablet (150 mg total) by mouth 2 (two) times daily. -     Oxcarbazepine (TRILEPTAL) 300 MG tablet; Take 1 tablet (300 mg total) by mouth 2 (two) times daily.  Generalized anxiety disorder -     Oxcarbazepine (TRILEPTAL) 300 MG tablet; Take 1 tablet (300 mg total) by mouth 2 (two) times daily. -     ALPRAZolam (XANAX) 0.25 MG tablet; Take 1 tablet (0.25 mg total) by mouth 2 (two) times daily as needed. for anxiety  Primary insomnia -     Oxcarbazepine (TRILEPTAL) 300 MG tablet; Take 1 tablet (300 mg total) by mouth 2 (two) times daily. -     hydrOXYzine (VISTARIL) 50 MG capsule; Take 1 capsule by mouth three times daily as needed    Please see After Visit Summary for patient specific instructions.  Future Appointments  Date Time Provider [place]  07/18/2021  4:00 PM [name], LCSW CP-CP None  07/28/2021  9:00 AM Wine, Argie Ramming, RN [facility] None  08/15/2021  4:00 PM [name], LCSW CP-CP None  09/14/2021  3:30 PM [name], PMHNP CP-CP None  09/26/2021  7:50 AM Pieter Partridge, DO LBN[facility] None  09/28/2021  8:20 AM McDonough, Si Gaul, PA-C [facility] None  No orders of the defined types were placed in this encounter.   -------------------------------

## 2021-07-18 ENCOUNTER — Ambulatory Visit (INDEPENDENT_AMBULATORY_CARE_PROVIDER_SITE_OTHER): Payer: 59 | Admitting: Psychiatry

## 2021-07-18 ENCOUNTER — Other Ambulatory Visit: Payer: Self-pay

## 2021-07-18 DIAGNOSIS — R69 Illness, unspecified: Secondary | ICD-10-CM | POA: Diagnosis not present

## 2021-07-18 DIAGNOSIS — F39 Unspecified mood [affective] disorder: Secondary | ICD-10-CM
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
## 2021-07-24 DIAGNOSIS — L700 Acne vulgaris: Secondary | ICD-10-CM | POA: Diagnosis not present

## 2021-07-28 ENCOUNTER — Ambulatory Visit: Payer: Self-pay | Admitting: *Deleted

## 2021-07-31 ENCOUNTER — Other Ambulatory Visit: Payer: Self-pay | Admitting: *Deleted

## 2021-07-31 NOTE — Patient Outreach (Signed)
[place] [facility]) Care Management  07/31/2021  Jalea Bronaugh 07/12/1976 [id number]  Unsuccessful outreach attempt made to patient. RN Health Coach left HIPAA compliant voicemail message along with her contact information.  Plan: RN Health Coach will call patient within the month of January.  [name] RN, BSN [place] [phone] [email or web address]

## 2021-08-13 DIAGNOSIS — J029 Acute pharyngitis, unspecified: Secondary | ICD-10-CM | POA: Diagnosis not present

## 2021-08-13 DIAGNOSIS — H6501 Acute serous otitis media, right ear: Secondary | ICD-10-CM | POA: Diagnosis not present

## 2021-08-13 DIAGNOSIS — Z03818 Encounter for observation for suspected exposure to other biological agents ruled out: Secondary | ICD-10-CM | POA: Diagnosis not present

## 2021-08-13 DIAGNOSIS — H66002 Acute suppurative otitis media without spontaneous rupture of ear drum, left ear: Secondary | ICD-10-CM | POA: Diagnosis not present

## 2021-08-13 DIAGNOSIS — Z20822 Contact with and (suspected) exposure to covid-19: Secondary | ICD-10-CM | POA: Diagnosis not present

## 2021-08-15 ENCOUNTER — Ambulatory Visit: Payer: 59 | Admitting: Psychiatry

## 2021-08-15 ENCOUNTER — Telehealth: Payer: Self-pay

## 2021-08-15 NOTE — Telephone Encounter (Signed)
Spoke to [facility] and informed her of the pt's symptoms and Lauren advised for pt to try flonase and mucinex.  Give the antibiotics a little longer to work and if not any better within 7 days to let us know.  Pt understood

## 2021-08-15 NOTE — Telephone Encounter (Signed)
error 

## 2021-08-29 ENCOUNTER — Encounter: Payer: Self-pay | Admitting: Internal Medicine

## 2021-09-08 ENCOUNTER — Other Ambulatory Visit: Payer: Self-pay | Admitting: *Deleted

## 2021-09-08 NOTE — Patient Outreach (Addendum)
[place] [facility]) Care Management  09/08/2021  Lavonne Kinderman 1975/12/28 [id number]  Unsuccessful outreach attempt made to patient. RN Health Coach left HIPAA compliant voicemail message along with her contact information.  Plan: RN Health Coach will call patient within the month of February.  [name] RN, BSN [place] [phone] [email or web address]

## 2021-09-14 ENCOUNTER — Ambulatory Visit: Payer: 59 | Admitting: Psychiatry

## 2021-09-19 NOTE — Progress Notes (Signed)
NEUROLOGY CONSULTATION NOTE  Ashley Stephens MRN: [id number] DOB: Sep 27, 1975  Referring provider: Clayborn Bigness, MD Primary care provider: Clayborn Bigness, MD  Reason for consult:  occipital neuralgia  Assessment/Plan:   1  Persistent left sided hemispheric headache - due to tenderness to palpation of the suboccipital and left sided cervical region, consider cervicogenic etiology.  TMJ dysfunction also possible but not significantly tender to palpation.  Hemicrania continua less likely given absence of autonomic symptoms.  Low probability of giant cell arteritis but will evaluate. 2  Memory deficits - neurodegenerative disease not suspected.  Already on B12.  May be secondary to anxiety but will evaluate further.  MRI and MRA of brain X-rays cervical spine Check Sed rate, CRP, TSH Will hold off on management until after tests come back.  Consider muscle relaxant such as tizanidine.  She is already on multiple antidepressants and anticonvulsants for depression and anxiety, so I would like to avoid starting another medication.  Will likely consider physical therapy for neck pain if workup does not reveal other secondary etiology. Follow up after testing.   Subjective:  Ashley Stephens is a 46 year old female with HTN, DM II, thyroid disorder and depression/anxiety who presents for occipital neuralgia.  History supplemented by referring provider's note.  She has been experiencing a persistent left sided headache for over a year but worse in the evening.  It is a 6-7/10 mildly throbbing/aching pain involving the entire left side of head (excluding frontal region) and face.  Some blurred vision but nothing localizing to just the left eye.  Has not had an eye exam.  Denies autonomic symptoms, facial numbness, hearing loss, aural fullness, radicular pain, numbness or weakness down the left arm. She was diagnosed with left sided sinus/ear infection in November-December which was treated with  antibiotics but headache still persisted.  Takes 4 Tylenol a day.   She also notes some short term memory problems such as trying to recall if she took her medication.  She takes B12 supplement.  She says that her son was found to have a "cerebral embolism" which required intervention.   Current medications:  lamotrigine 150mg  twice daily, oxcarbazepine 300mg  in AM and 600mg  in PM, Latuda, Pristiq, propranolol 20mg  TID PRN (anxiety), Xanax PRN   PAST MEDICAL HISTORY: Past Medical History:  Diagnosis Date   Anxiety    Depression    Diabetes mellitus without complication ([place])    Gastric ulcer    GERD (gastroesophageal reflux disease)    Hypertension    Osteoarthritis    Thyroid disorder     PAST SURGICAL HISTORY: Past Surgical History:  Procedure Laterality Date   ABDOMINAL HYSTERECTOMY  2013   BARIATRIC SURGERY     BREAST BIOPSY  1995   COLONOSCOPY WITH PROPOFOL N/A 10/28/2020   Procedure: COLONOSCOPY WITH PROPOFOL;  Surgeon: Jonathon Bellows, MD;  Location: [facility] ENDOSCOPY;  Service: Gastroenterology;  Laterality: N/A;   GASTRIC BYPASS  2012   TUBAL LIGATION  1996   tummy tuck  05/27/2020    MEDICATIONS: Current Outpatient Medications on File Prior to Visit  Medication Sig Dispense Refill   ALPRAZolam (XANAX) 0.25 MG tablet Take 1 tablet (0.25 mg total) by mouth 2 (two) times daily as needed. for anxiety 45 tablet 5   cholecalciferol (VITAMIN D3) 25 MCG (1000 UNIT) tablet Take 1,000 Units by mouth daily.     Cyanocobalamin (VITAMIN B 12 PO) Take by mouth.     desvenlafaxine (PRISTIQ) 100 MG 24 hr  tablet Take 1 tablet (100 mg total) by mouth every morning. 90 tablet 1   desvenlafaxine (PRISTIQ) 50 MG 24 hr tablet Take 1 tablet (50 mg total) by mouth every evening. 90 tablet 1   Dulaglutide (TRULICITY) 1.5 BH/4.1PF SOPN INJECT 1 PEN SUBCUTANEOUSLY ONCE A WEEK 12 mL 0   hydrOXYzine (VISTARIL) 50 MG capsule Take 1 capsule by mouth three times daily as needed 270 capsule 0    lamoTRIgine (LAMICTAL) 150 MG tablet Take 1 tablet (150 mg total) by mouth 2 (two) times daily. 180 tablet 1   levothyroxine (SYNTHROID) 25 MCG tablet TAKE 1 TABLET BY MOUTH ONCE DAILY BEFORE BREAKFAST. 90 tablet 0   Lurasidone HCl (LATUDA) 60 MG TABS Take 1 tablet (60 mg total) by mouth daily with supper. 90 tablet 0   omeprazole (PRILOSEC) 40 MG capsule Take 1 capsule (40 mg total) by mouth daily. (Patient taking differently: Take 40 mg by mouth daily as needed.) 30 capsule 3   Oxcarbazepine (TRILEPTAL) 300 MG tablet Take 1 tablet (300 mg total) by mouth 2 (two) times daily. 180 tablet 0   propranolol (INDERAL) 20 MG tablet Take 1 tablet (20 mg total) by mouth 3 (three) times daily as needed. 270 tablet 1   rosuvastatin (CRESTOR) 5 MG tablet Take 1 tablet (5 mg total) by mouth daily. 90 tablet 3   No current facility-administered medications on file prior to visit.    ALLERGIES: No Known Allergies  FAMILY HISTORY: Family History  Problem Relation Age of Onset   Breast cancer Maternal Grandmother    Diabetes Maternal Grandmother    Heart disease Maternal Grandmother    Hypertension Maternal Grandmother    Heart disease Brother    Hypertension Brother    Hyperlipidemia Brother    Diabetes Maternal Aunt    Heart disease Maternal Aunt    Hypertension Maternal Aunt    Hyperlipidemia Maternal Aunt    Diabetes Maternal Aunt    Heart disease Maternal Aunt    Hypertension Maternal Aunt    Hyperlipidemia Maternal Aunt    Diabetes Maternal Aunt    Heart disease Maternal Aunt    Hypertension Maternal Aunt    Hyperlipidemia Maternal Aunt     Objective:  Blood pressure 117/75, pulse 79, height 5\' 7"  (1.702 m), weight 260 lb 12.8 oz (118.3 kg), SpO2 100 %. General: No acute distress.  Patient appears well-groomed.   Head:  Normocephalic/atraumatic Eyes:  fundi examined but not visualized Neck: supple, no paraspinal tenderness, full range of motion Back: No paraspinal tenderness Heart:  regular rate and rhythm Lungs: Clear to auscultation bilaterally. Vascular: No carotid bruits. Neurological Exam: Mental status: alert and oriented to person, place, and time, recent and remote memory intact, fund of knowledge intact, attention and concentration intact, speech fluent and not dysarthric, language intact. Cranial nerves: CN I: not tested CN II: pupils equal, round and reactive to light, visual fields intact CN III, IV, VI:  full range of motion, no nystagmus, no ptosis CN V: facial sensation intact. CN VII: upper and lower face symmetric CN VIII: hearing intact CN IX, X: gag intact, uvula midline CN XI: sternocleidomastoid and trapezius muscles intact CN XII: tongue midline Bulk & Tone: normal, no fasciculations. Motor:  muscle strength 5/5 throughout Sensation:  [place], temperature and vibratory sensation intact. Deep Tendon Reflexes:  2+ throughout,  toes downgoing.   Finger to nose testing:  Without dysmetria.   Heel to shin:  Without dysmetria.   Gait:  Normal station  and stride.  [place] negative.    Thank you for allowing me to take part in the care of this patient.  Metta Clines, DO  CC: Clayborn Bigness, MD

## 2021-09-20 ENCOUNTER — Other Ambulatory Visit: Payer: Self-pay

## 2021-09-20 ENCOUNTER — Encounter: Payer: Self-pay | Admitting: Psychiatry

## 2021-09-20 ENCOUNTER — Ambulatory Visit: Payer: 59 | Admitting: Psychiatry

## 2021-09-20 DIAGNOSIS — F39 Unspecified mood [affective] disorder: Secondary | ICD-10-CM

## 2021-09-20 DIAGNOSIS — R69 Illness, unspecified: Secondary | ICD-10-CM | POA: Diagnosis not present

## 2021-09-20 DIAGNOSIS — F332 Major depressive disorder, recurrent severe without psychotic features: Secondary | ICD-10-CM

## 2021-09-20 MED ORDER — LATUDA 20 MG PO TABS
20.0000 mg | ORAL_TABLET | Freq: Every evening | ORAL | 0 refills | Status: DC
Start: 2021-09-20 — End: 2021-09-22

## 2021-09-20 NOTE — Progress Notes (Signed)
[facility] Counselor/Therapist Progress Note  Patient ID: Ashley Stephens, MRN: [id number],    Date: 09/20/2021  Time Spent: 50 minutes   Treatment Type: Individual Therapy  Reported Symptoms: sadness, depression, anxiety, frustration "because I can't make it stop"  Mental Status Exam:  Appearance:   Casual     Behavior:  Sharing  Motor:  Normal  Speech/Language:   Slow  Affect:  Depressed, Tearful, and anxious  Mood:  anxious, depressed, and sad  Thought process:  Some tangentiality  Thought content:    Overthinking, some obsessiveness  Sensory/Perceptual disturbances:    WNL  Orientation:  oriented to person, place, time/date, situation, day of week, month of year, year, and stated date of Jan. 18, 2023  Attention:  Good  Concentration:  Fair  Memory:  Reports short term memory issues  Fund of knowledge:   Good  Insight:    Fair  Judgment:   Good and Fair  Impulse Control:  Good and Fair   Risk Assessment: Danger to Self:   reporting vague thoughts, Contracted for safety with med provider and therapist Self-injurious Behavior:  denies Danger to Others:  denies Duty to Warn:no Physical Aggression / Violence:No  Access to Firearms a concern: No  Gang Involvement:No   Subjective: Patient in today reporting anxiety, depression, increased stress, and reporting thoughts of vague SI but denies any plan to harm self nor others. Also contracted for safety earlier today with her med provider, and we reviewed this in our session together this afternoon. States when she sleeps, she's ok. Doesn't see daughter as much now due to daughter's BF. Doesn't have many friends, states she has 1 friend who has issues of her own. 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 States she needs more activities in her life but doesn't follow through to make that happen. Feeling stressed with "normal stuff, financial issues." I feel frustrated the way I've allowed my life to get better  especially with a different job, and I allow other circumstances to hinder me in moving forward.  Encouraged patient to continue venting and processing these feelings as I feel like being heard and being supported is important for her especially right now.    Interventions: Solution-Oriented/Positive Psychology and Ego-Supportive   Treatment Goal Plan:  Patient not signing tx plan on computer screen due to [place].  Treatment Goals: Goals remain on treatment plan as patient works with strategies to achieve her goals.  Progress is noted each session and documented in "Progress" section of Note. Long term goal: Develop the ability to recognize, accept, and cope with feelings of depression and anxiety. Short term goal: Learn and implement personal skills for managing stress, solving daily problems, and resolving conflicts effectively. Strategy: Teach patient calming strategies, problem solving skills, and conflict resolution skills to better manage daily stressors  Diagnosis:   ICD-10-CM   1. Severe episode of recurrent major depressive disorder, without psychotic features ([place])  F33.2      Plan:  Patient today showing motivation and active participation in session as we worked on her depression, anxiety, feelings of increased stress, and reporting thoughts of vague SI.  Most of the session was spent with her venting and processing her anxious and depressive thoughts as well as some thoughts she has had of self-harm.  Processed these more clearly with patient and she did agree to keep the previous contract for safety that she had established with her med provider earlier today.  Med provider  is to see her again on 09/22/2021.  Did agree to keep her contract for safety and come for her next appointment on 09/22/2021.  Encouraged patient in practicing more positive behaviors including: Considering alternative ways of looking at herself and her future that include potential positives, realize how certain  things impact her mood very sharply, getting outside daily, realizing that in assuming the negatives this feeds her anxiety and depression, reduce her self negating, practice more positive self-care and self talk daily, be willing to put into action some of the more productive ways of interacting with others per our discussion in previous sessions and mentioned again today, practice viewing herself in more positive ways and find things about herself that she actually likes, believe in herself more in her ability to make changes even in difficult circumstances, practice more positive communication skills with others including active listening and "paying attention to how I say what I say", take small breaks as she is able during the day, be mindful of her self talk and attitude when it goes in a negative direction and try to interrupt it bringing it towards more of a positive/encouraging direction, practice self calming strategies, look more for what might go right versus wrong, staying in contact with friend who is supportive, remain in the present focusing on what she can control or change, continue to work past hurts and resentments that arise at difficult times, and feel encouraged by the strengths she shows working with goal-directed behaviors to move forward in a direction that supports improved emotional health.  Goal review and progress/challenges noted with patient.  Next appointment within 2 to 3 weeks.  This record has been created using [employer].  Chart creation errors have been sought, but may not always have been located and corrected.  Such creation errors do not reflect on the standard of medical care provided.  [name], LCSW

## 2021-09-20 NOTE — Progress Notes (Signed)
[name] [id number] 1975/12/16 46 y.o.  Subjective:   Patient ID:  Ashley Stephens is a 32 y.o. (DOB 1976-05-19) female.  Chief Complaint:  Chief Complaint  Patient presents with   Anxiety   Depression   Insomnia    HPI Ashley Stephens presents to the office today for follow-up of mood disturbance, anxiety, and insomnia. She reports, "I feel like I am spiraling." She reports that today she did not remember to take her medication "but in my mind I thought I had took it." She reports, "I feel like [place]." She reports poor sleep. Sleep is fragmented and sleeping for about 2-3 hours at a time. She reports racing thoughts when she tries to sleep. Reports that she is having negative, catastrophic thoughts. She reports that she "tries to act like everything is ok, but it's not." She reports feeling anxious. She reports intrusive thoughts, like "this is the day I am going to die." Having intrusive thoughts that people are mad at her or if she does not do certain things she will lose her job. She reports that she has not been able to dismiss intrusive thoughts like she normally would. She reports "I get irritated because I don't like feeling this way and don't know what to do about it."   Denies impulsive or risky thoughts. She reports difficulty with concentration."It's a new year, and I am still stuck." She reports feeling, "I'm a failure" and does not believe people when they try to convince her otherwise. She reports occ fleeting suicidal thoughts. Denies suicidal plan or intent. "I don't want to hurt nobody." She contracts for safety.   She reports that she has had worsening s/s in the last 2 weeks. She reports that the holidays are difficult for her. She reports that "Christmas and New Year's wasn't good."   She reports that she feels like "someone is always watching me... like I am always under surveillance." Reports feeling that people are watching to see if she  says or does anything to wrong. She reports, "I apologize for things that have not even happened."   She and her daughter have not been as close since daughter now has a boyfriend. She reports that she has had more time alone.   Past medication trials: Wellbutrin Effexor Pristiq-effective Lamictal-effective for mood Latuda Xanax- Usually takes at night Propanolol- Effective Hydroxyzine Rexulti Gabapentin- prescribed for back pain. Dizziness, headaches. Lithium Trileptal  AIMS    Flowsheet Row Office Visit from 09/20/2021 in [place] Office Visit from 07/11/2021 in [place] Visit from 12/26/2020 in [place] Visit from 08/12/2020 in [place] Visit from 04/11/2020 in [place] Total Score 0 0 0 0 0      [place] Visit from 12/25/2019 in [place]  Total GAD-7 Score 16      PHQ2-9    [place] Visit from 06/29/2021 in [facility], [facility] Office Visit from 12/30/2020 in [facility], [facility] Office Visit from 10/03/2020 in [facility], [facility] Patient Outreach Telephone from 08/22/2020 in [place] Visit from 05/24/2020 in [facility], [facility]  PHQ-2 Total Score 0 0 3 2 0  PHQ-9 Total Score -- -- -- 4 --      Flowsheet Row Admission (Discharged) from 10/28/2020 in [place] No Risk  Review of Systems:  Review of Systems  Musculoskeletal:  Negative for gait problem.  Neurological:  Positive for headaches. Negative for tremors.       Has apt with neurology for pain in head  Psychiatric/Behavioral:         Please refer to HPI   Medications: I have reviewed the patient's current medications.  Current Outpatient Medications  Medication Sig Dispense Refill    lurasidone (LATUDA) 20 MG TABS tablet Take 1 tablet (20 mg total) by mouth at bedtime. Take with a 60 mg tablet to equal 80 mg dose 7 tablet 0   ALPRAZolam (XANAX) 0.25 MG tablet Take 1 tablet (0.25 mg total) by mouth 2 (two) times daily as needed. for anxiety 45 tablet 5   cholecalciferol (VITAMIN D3) 25 MCG (1000 UNIT) tablet Take 1,000 Units by mouth daily.     Cyanocobalamin (VITAMIN B 12 PO) Take by mouth.     desvenlafaxine (PRISTIQ) 100 MG 24 hr tablet Take 1 tablet (100 mg total) by mouth every morning. 90 tablet 1   desvenlafaxine (PRISTIQ) 50 MG 24 hr tablet Take 1 tablet (50 mg total) by mouth every evening. 90 tablet 1   Dulaglutide (TRULICITY) 1.5 PJ/0.9TO SOPN INJECT 1 PEN SUBCUTANEOUSLY ONCE A WEEK 12 mL 0   hydrOXYzine (VISTARIL) 50 MG capsule Take 1 capsule by mouth three times daily as needed 270 capsule 0   lamoTRIgine (LAMICTAL) 150 MG tablet Take 1 tablet (150 mg total) by mouth 2 (two) times daily. 180 tablet 1   levothyroxine (SYNTHROID) 25 MCG tablet TAKE 1 TABLET BY MOUTH ONCE DAILY BEFORE BREAKFAST. 90 tablet 0   Lurasidone HCl (LATUDA) 60 MG TABS Take 1 tablet (60 mg total) by mouth daily with supper. 90 tablet 0   omeprazole (PRILOSEC) 40 MG capsule Take 1 capsule (40 mg total) by mouth daily. (Patient taking differently: Take 40 mg by mouth daily as needed.) 30 capsule 3   Oxcarbazepine (TRILEPTAL) 300 MG tablet Take 1 tablet (300 mg total) by mouth 2 (two) times daily. 180 tablet 0   propranolol (INDERAL) 20 MG tablet Take 1 tablet (20 mg total) by mouth 3 (three) times daily as needed. 270 tablet 1   rosuvastatin (CRESTOR) 5 MG tablet Take 1 tablet (5 mg total) by mouth daily. 90 tablet 3   No current facility-administered medications for this visit.    Medication Side Effects: None  Allergies: No Known Allergies  Past Medical History:  Diagnosis Date   Anxiety    Depression    Diabetes mellitus without complication (HCC)    Gastric ulcer    GERD  (gastroesophageal reflux disease)    Hypertension    Osteoarthritis    Thyroid disorder     Past Medical History, Surgical history, Social history, and Family history were reviewed and updated as appropriate.   Please see review of systems for further details on the patient's review from today.   Objective:   Physical Exam:  There were no vitals taken for this visit.  Physical Exam Constitutional:      General: She is not in acute distress. Musculoskeletal:        General: No deformity.  Neurological:     Mental Status: She is alert and oriented to person, place, and time.     Coordination: Coordination normal.  Psychiatric:        Attention and Perception: Attention and perception normal. She does not perceive auditory or visual hallucinations.  Mood and Affect: Mood is anxious and depressed. Affect is not labile, blunt, angry or inappropriate.        Speech: Speech normal.        Thought Content: Thought content is paranoid. Thought content is not delusional. Thought content does not include homicidal or suicidal ideation. Thought content does not include homicidal or suicidal plan.        Cognition and Memory: Cognition and memory normal.        Judgment: Judgment normal.     Comments: Insight fair More withdrawn compared to previous exams.  Frequently responds, "I don't know."    Lab Review:     Component Value Date/Time   NA 139 12/30/2020 0941   NA 142 12/03/2011 1059   K 4.6 12/30/2020 0941   K 3.4 (L) 12/03/2011 1059   CL 101 12/30/2020 0941   CL 107 12/03/2011 1059   CO2 21 12/30/2020 0941   CO2 27 12/03/2011 1059   GLUCOSE 88 12/30/2020 0941   GLUCOSE 92 07/12/2020 1526   GLUCOSE 66 12/03/2011 1059   BUN 7 12/30/2020 0941   BUN 4 (L) 12/03/2011 1059   CREATININE 0.84 12/30/2020 0941   CREATININE 0.82 12/03/2011 1059   CALCIUM 9.3 12/30/2020 0941   CALCIUM 8.5 12/03/2011 1059   PROT 6.4 12/30/2020 0941   ALBUMIN 4.3 12/30/2020 0941   AST 17  12/30/2020 0941   ALT 10 12/30/2020 0941   ALKPHOS 79 12/30/2020 0941   BILITOT 0.3 12/30/2020 0941   GFRNONAA >60 07/12/2020 1526   GFRNONAA >60 12/03/2011 1059   GFRAA 78 07/10/2019 0953   GFRAA >60 12/03/2011 1059       Component Value Date/Time   WBC 4.8 12/30/2020 0941   WBC 7.0 07/12/2020 1526   RBC 4.69 12/30/2020 0941   RBC 4.76 07/12/2020 1526   HGB 14.0 12/30/2020 0941   HCT 40.8 12/30/2020 0941   PLT 311 12/30/2020 0941   MCV 87 12/30/2020 0941   MCH 29.9 12/30/2020 0941   MCH 29.4 07/12/2020 1526   MCHC 34.3 12/30/2020 0941   MCHC 33.3 07/12/2020 1526   RDW 12.9 12/30/2020 0941   LYMPHSABS 1.4 12/30/2020 0941   MONOABS 0.6 07/12/2020 1526   EOSABS 0.1 12/30/2020 0941   BASOSABS 0.0 12/30/2020 0941    No results found for: POCLITH, LITHIUM   No results found for: PHENYTOIN, PHENOBARB, VALPROATE, CBMZ   .res Assessment: Plan:    Pt seen for 45 minutes and time spent discussing safety plan and discussing case with her therapist, [name], LCSW. (Pt also seeing therapist today). Pt contracts for safety. Provided information for [facility] urgent care center to include location and contact information in the event that she feels she is no longer safe and/or requires immediate care. Discussed follow-up with this provider in 2 days to re-assess safety, evaluate response to medication change, and discuss other treatment options and support, such as PHP program. Pt is in agreement with this plan.  Discussed potential benefits, risks, and side effects of increasing Latuda to 80 mg po every evening to improve mood, anxiety, intrusive thoughts, and feelings that she is being watched. Provided pt with samples of Latuda 20 mg to take with current supply of 60 mg so that she can start increased dose immediately.  Will continue all other medications as prescribed.  Encouraged pt to keep apt with neurologist for next week to evaluate complaint of pain her head since she reports that  she is worried that "  something may be wrong with me" and would like to determine cause of her symptoms. Discussed that a neurologist would be able to evaluate this complaint, determine if additional tests are needed, and possibly start treatment. Pt indicates she will see neurologist as scheduled.  Recommend leave of absence from work with tentative return to work date of 09/25/21. Pt to follow-up with this provider on 09/22/21.  Patient advised to contact office with any questions, adverse effects, or acute worsening in signs and symptoms.   Ashley Stephens was seen today for anxiety, depression and insomnia.  Diagnoses and all orders for this visit:  Mood disorder ([place]) -     lurasidone (LATUDA) 20 MG TABS tablet; Take 1 tablet (20 mg total) by mouth at bedtime. Take with a 60 mg tablet to equal 80 mg dose     Please see After Visit Summary for patient specific instructions.  Future Appointments  Date Time Provider [place]  09/22/2021 12:00 PM [name], PMHNP [facility] None  09/26/2021  7:50 AM Pieter Partridge, DO [facility] None  09/28/2021  8:20 AM McDonough, Si Gaul, PA-C [facility] None  10/16/2021  4:00 PM [name], LCSW CP[facility]CP None  10/24/2021  9:00 AM Wine, Argie Ramming, RN [facility] None    No orders of the defined types were placed in this encounter.   -------------------------------

## 2021-09-22 ENCOUNTER — Ambulatory Visit: Payer: 59 | Admitting: Psychiatry

## 2021-09-22 ENCOUNTER — Other Ambulatory Visit: Payer: Self-pay

## 2021-09-22 ENCOUNTER — Encounter: Payer: Self-pay | Admitting: Psychiatry

## 2021-09-22 ENCOUNTER — Telehealth: Payer: Self-pay | Admitting: Psychiatry

## 2021-09-22 DIAGNOSIS — F39 Unspecified mood [affective] disorder: Secondary | ICD-10-CM

## 2021-09-22 DIAGNOSIS — R69 Illness, unspecified: Secondary | ICD-10-CM | POA: Diagnosis not present

## 2021-09-22 DIAGNOSIS — F411 Generalized anxiety disorder: Secondary | ICD-10-CM | POA: Diagnosis not present

## 2021-09-22 DIAGNOSIS — F5101 Primary insomnia: Secondary | ICD-10-CM | POA: Diagnosis not present

## 2021-09-22 DIAGNOSIS — Z0289 Encounter for other administrative examinations: Secondary | ICD-10-CM

## 2021-09-22 MED ORDER — LURASIDONE HCL 80 MG PO TABS: 80.0000 mg | ORAL_TABLET | Freq: Every day | ORAL | 0 refills | Status: DC

## 2021-09-22 MED ORDER — OXCARBAZEPINE 300 MG PO TABS: ORAL_TABLET | ORAL | 0 refills | Status: DC

## 2021-09-22 NOTE — Patient Instructions (Addendum)
May want to consider Partial Hospitalization Program.   [place]  [phone]  [facility] Outpatient [phone]

## 2021-09-22 NOTE — Telephone Encounter (Signed)
Received FMLA forms. Placed in Traci's box 1/20

## 2021-09-22 NOTE — Progress Notes (Signed)
Ashley Stephens [id number] [birth year] 46 y.o.  Subjective:   Patient ID:  Ashley Stephens is a 46 y.o. (DOB 1976/01/06) female.  Chief Complaint:  Chief Complaint  Patient presents with   Depression   Anxiety   Insomnia    HPI Ashley Stephens presents to the office today for follow-up of anxiety, depression, and insomnia. She reports "my mind is all over the place." Continued racing thoughts that are anxious in content- "a lot of negativity." She reports that several things disrupted her routine and this made it difficult for her. She reports feeling hopeless. Motivation has been low. She reports that she has had restless sleep with intermittent awakenings. Estimates sleeping 4-6 hours a night. "I just want to sit there." She reports that tiredness "comes and goes throughout the day." She reports continued persistent sadness. Reports that she had crying episode this morning. She reports that she feels like she is under surveillance and "always under the microscope... about everything."   She reports suicidal thoughts "come and go." Denies suicidal plan or intent.   She reports that she sometimes has trouble recalling if she took her medication or not.   Past medication trials: Wellbutrin Effexor Pristiq-effective Lamictal-effective for mood Latuda Xanax- Usually takes at night Propanolol- Effective Hydroxyzine Rexulti Gabapentin- prescribed for back pain. Dizziness, headaches. Lithium Trileptal  [place] Office Visit from 09/20/2021 in [place] Office Visit from 07/11/2021 in [place] Visit from 12/26/2020 in [place] Visit from 08/12/2020 in [place] Visit from 04/11/2020 in [place] Total Score 0 0 0 0 0      [place] Visit from 12/25/2019 in [place]  Total GAD-7 Score 16       PHQ2-9    [place] Office Visit from 06/29/2021 in [facility], [facility] Office Visit from 12/30/2020 in [facility], [facility] Office Visit from 10/03/2020 in [facility], [facility] Patient Outreach Telephone from 08/22/2020 in [place] Visit from 05/24/2020 in [facility], [facility]  PHQ-2 Total Score 0 0 3 2 0  PHQ-9 Total Score -- -- -- 4 --      Flowsheet Row Admission (Discharged) from 10/28/2020 in [place] No Risk        Review of Systems:  Review of Systems  Musculoskeletal:  Negative for gait problem.  Neurological:  Positive for headaches.  Psychiatric/Behavioral:         Please refer to HPI   Medications: I have reviewed the patient's current medications.  Current Outpatient Medications  Medication Sig Dispense Refill   lurasidone (LATUDA) 80 MG TABS tablet Take 1 tablet (80 mg total) by mouth daily with supper. 30 tablet 0   ALPRAZolam (XANAX) 0.25 MG tablet Take 1 tablet (0.25 mg total) by mouth 2 (two) times daily as needed. for anxiety 45 tablet 5   cholecalciferol (VITAMIN D3) 25 MCG (1000 UNIT) tablet Take 1,000 Units by mouth daily.     Cyanocobalamin (VITAMIN B 12 PO) Take by mouth.     desvenlafaxine (PRISTIQ) 100 MG 24 hr tablet Take 1 tablet (100 mg total) by mouth every morning. 90 tablet 1   desvenlafaxine (PRISTIQ) 50 MG 24 hr tablet Take 1 tablet (50 mg total) by mouth every evening. 90 tablet 1   Dulaglutide (TRULICITY) 1.5 WU/9.8JX SOPN  INJECT 1 PEN SUBCUTANEOUSLY ONCE A WEEK 12 mL 0   hydrOXYzine (VISTARIL) 50 MG capsule Take 1 capsule by mouth three times daily as needed 270 capsule 0   lamoTRIgine (LAMICTAL) 150 MG tablet Take 1 tablet (150 mg total) by mouth 2 (two) times daily. 180 tablet 1   levothyroxine (SYNTHROID) 25 MCG tablet TAKE 1 TABLET BY MOUTH ONCE DAILY BEFORE BREAKFAST. 90 tablet 0    omeprazole (PRILOSEC) 40 MG capsule Take 1 capsule (40 mg total) by mouth daily. (Patient taking differently: Take 40 mg by mouth daily as needed.) 30 capsule 3   Oxcarbazepine (TRILEPTAL) 300 MG tablet Take 1 tablet (300 mg total) by mouth every morning AND 2 tablets (600 mg total) at bedtime. 270 tablet 0   propranolol (INDERAL) 20 MG tablet Take 1 tablet (20 mg total) by mouth 3 (three) times daily as needed. 270 tablet 1   rosuvastatin (CRESTOR) 5 MG tablet Take 1 tablet (5 mg total) by mouth daily. 90 tablet 3   No current facility-administered medications for this visit.    Medication Side Effects: None  Allergies: No Known Allergies  Past Medical History:  Diagnosis Date   Anxiety    Depression    Diabetes mellitus without complication (HCC)    Gastric ulcer    GERD (gastroesophageal reflux disease)    Hypertension    Osteoarthritis    Thyroid disorder     Past Medical History, Surgical history, Social history, and Family history were reviewed and updated as appropriate.   Please see review of systems for further details on the patient's review from today.   Objective:   Physical Exam:  There were no vitals taken for this visit.  Physical Exam Constitutional:      General: She is not in acute distress. Musculoskeletal:        General: No deformity.  Neurological:     Mental Status: She is alert and oriented to person, place, and time.     Coordination: Coordination normal.  Psychiatric:        Attention and Perception: Attention and perception normal. She does not perceive auditory or visual hallucinations.        Mood and Affect: Mood is anxious and depressed. Affect is not labile, blunt, angry or inappropriate.        Speech: Speech normal.        Behavior: Behavior normal. Behavior is cooperative.        Thought Content: Thought content is paranoid. Thought content is not delusional. Thought content does not include homicidal or suicidal ideation. Thought  content does not include homicidal or suicidal plan.        Cognition and Memory: Cognition and memory normal.        Judgment: Judgment normal.     Comments: Insight intact    Lab Review:     Component Value Date/Time   NA 139 12/30/2020 0941   NA 142 12/03/2011 1059   K 4.6 12/30/2020 0941   K 3.4 (L) 12/03/2011 1059   CL 101 12/30/2020 0941   CL 107 12/03/2011 1059   CO2 21 12/30/2020 0941   CO2 27 12/03/2011 1059   GLUCOSE 88 12/30/2020 0941   GLUCOSE 92 07/12/2020 1526   GLUCOSE 66 12/03/2011 1059   BUN 7 12/30/2020 0941   BUN 4 (L) 12/03/2011 1059   CREATININE 0.84 12/30/2020 0941   CREATININE 0.82 12/03/2011 1059   CALCIUM 9.3 12/30/2020 0941   CALCIUM 8.5 12/03/2011 1059  PROT 6.4 12/30/2020 0941   ALBUMIN 4.3 12/30/2020 0941   AST 17 12/30/2020 0941   ALT 10 12/30/2020 0941   ALKPHOS 79 12/30/2020 0941   BILITOT 0.3 12/30/2020 0941   GFRNONAA >60 07/12/2020 1526   GFRNONAA >60 12/03/2011 1059   GFRAA 78 07/10/2019 0953   GFRAA >60 12/03/2011 1059       Component Value Date/Time   WBC 4.8 12/30/2020 0941   WBC 7.0 07/12/2020 1526   RBC 4.69 12/30/2020 0941   RBC 4.76 07/12/2020 1526   HGB 14.0 12/30/2020 0941   HCT 40.8 12/30/2020 0941   PLT 311 12/30/2020 0941   MCV 87 12/30/2020 0941   MCH 29.9 12/30/2020 0941   MCH 29.4 07/12/2020 1526   MCHC 34.3 12/30/2020 0941   MCHC 33.3 07/12/2020 1526   RDW 12.9 12/30/2020 0941   LYMPHSABS 1.4 12/30/2020 0941   MONOABS 0.6 07/12/2020 1526   EOSABS 0.1 12/30/2020 0941   BASOSABS 0.0 12/30/2020 0941    No results found for: POCLITH, LITHIUM   No results found for: PHENYTOIN, PHENOBARB, VALPROATE, CBMZ   .res Assessment: Plan:    Pt seen for 30 minutes and time spent discussing treatment options. Recommended considering PHP and provided her with contact information for [facility] and [facility]. She reports that she will consider this. She reports that is may also be helpful to see therapist more  frequently. Pt scheduled with [name], LCSW for 09/25/21. Reviewed safety plan and she contracts for safety.  Continue Latuda 80 mg po qd with supper to improve mood s/s. Discussed that increase in [place] may also help with insomnia and feeling that she is being monitored.  Will increase Trileptal to 300 mg po q am and 600 mg po QHS to improve mood stabilization. Discussed that increase in dose may also improve insomnia and anxiety.  Continue Xanax 0.25 mg po BID prn anxiety.  Continue Pristiq 100 mg po q am and 50 mg po QHS for depression.  Continue lamictal 150 mg po BID for mood stabilization.  Continue Propranolol 20 mg po TID prn anxiety.  Recommend leave of absence from work with tentative return to work date of 10/23/21.  Pt to follow-up with this provider on 10/10/21. Recommend continuing therapy with [name], LCSW.  Patient advised to contact office with any questions, adverse effects, or acute worsening in signs and symptoms.   Sarahmarie was seen today for depression, anxiety and insomnia.  Diagnoses and all orders for this visit:  Mood disorder ([place]) -     lurasidone (LATUDA) 80 MG TABS tablet; Take 1 tablet (80 mg total) by mouth daily with supper. -     Oxcarbazepine (TRILEPTAL) 300 MG tablet; Take 1 tablet (300 mg total) by mouth every morning AND 2 tablets (600 mg total) at bedtime.  Generalized anxiety disorder -     Oxcarbazepine (TRILEPTAL) 300 MG tablet; Take 1 tablet (300 mg total) by mouth every morning AND 2 tablets (600 mg total) at bedtime.  Primary insomnia -     Oxcarbazepine (TRILEPTAL) 300 MG tablet; Take 1 tablet (300 mg total) by mouth every morning AND 2 tablets (600 mg total) at bedtime.     Please see After Visit Summary for patient specific instructions.  Future Appointments  Date Time Provider [place]  09/25/2021  4:00 PM [name], [place] [facility] None  09/26/2021  7:50 AM Pieter Partridge, DO [facility] None  09/28/2021  8:20 AM McDonough,  Si Gaul,  PA-C [facility] None  10/10/2021 12:00 PM [name], PMHNP CP-CP None  10/16/2021  4:00 PM [name], LCSW CP-CP None  10/24/2021  9:00 AM Wine, Argie Ramming, RN [facility] None    No orders of the defined types were placed in this encounter.   -------------------------------

## 2021-09-25 ENCOUNTER — Telehealth: Payer: Self-pay | Admitting: Psychiatry

## 2021-09-25 ENCOUNTER — Other Ambulatory Visit: Payer: Self-pay

## 2021-09-25 ENCOUNTER — Ambulatory Visit: Payer: 59 | Admitting: Psychiatry

## 2021-09-25 DIAGNOSIS — F39 Unspecified mood [affective] disorder: Secondary | ICD-10-CM | POA: Diagnosis not present

## 2021-09-25 DIAGNOSIS — R69 Illness, unspecified: Secondary | ICD-10-CM | POA: Diagnosis not present

## 2021-09-25 NOTE — Progress Notes (Signed)
[facility] Counselor/Therapist Progress Note  Patient ID: Ashley Stephens, MRN: [id number],    Date: 09/25/2021  Time Spent: 55 minutes   Treatment Type: Individual Therapy  Reported Symptoms: depression (stronger symptom), sadness, frustration, anxiety  Mental Status Exam:  Appearance:   Casual     Behavior:  Appropriate, Sharing, and some motivation  Motor:  Normal  Speech/Language:   WNL  Affect:  Depressed and Flat  Mood:  anxious, depressed, and sad  Thought process:  goal directed  Thought content:    Some ruminating re: sadness, frustrations  Sensory/Perceptual disturbances:    WNL  Orientation:  oriented to person, place, time/date, situation, day of week, month of year, year, and stated date of Jan. 23, 2023  Attention:  Fair  Concentration:  Fair  Memory:  "Been having trouble remembering things like things I have to do."  [employer] of knowledge:   Fair  Insight:    Fair  Judgment:   Good and Fair  Impulse Control:  Good and Fair   Risk Assessment: Danger to Self:  No Self-injurious Behavior: No Danger to Others: No Duty to Warn:no Physical Aggression / Violence:No  Access to Firearms a concern: No  Gang Involvement:No   Subjective:  Patient in today reporting depression, frustration, sadness, and anxiety. Still reports occasional, but not current, vague SI but states she has no plan and nor intent, and continues to contract for safety. States since she has been out of work, "I've just laid in the bed, have no willpower nor energy." Feels useless, worthless."Spoke about her need for friends, and actually entered into the conversation about what this would take for her to have some friendships and what resources are available.  Has considered visiting a church, perhaps a larger one that has more activities going on..  Limited ways of meeting people "and the people I tend to attract are usually not good choices. States she has thought about the fact she does  need friends and maybe even thoughts of going to a church. Talked about this in more detail but has made no plans yet. "Don't have the energy". Is intermittently receptive to the thought that she's holding onto some of the self negating and did some good work in session today on this.  Am scheduling her back next week and she knows she can call if needed between now and then.  Again promises to do no harm to herself and if she is in need of after-hours services she is to call or go to the local hospital emergency department.  Continues to live with a friend in a better situation than she was living previously, several months ago.  States that she talks with that friend but not very in depth as the friend "has her own issues" but acknowledges that the friend has treated her well and allowed her to stay at her home.  Try to help patient work through some of her frustration with herself today and encouraged her venting more feelings and processing them which she did, in order to feel more heard and supported.  Difficult for her to maintain positive feelings right now and she is glad she has another appointment coming up soon with her med provider and also with this therapist.  Interventions: Solution-Oriented/Positive Psychology, Ego-Supportive, and Insight-Oriented  Treatment Goals: Goals remain on treatment plan as patient works with strategies to achieve her goals.  Progress is noted each session and documented in "Progress" section of Note. Long  term goal: Develop the ability to recognize, accept, and cope with feelings of depression and anxiety. Short term goal: Learn and implement personal skills for managing stress, solving daily problems, and resolving conflicts effectively. Strategy: Teach patient calming strategies, problem solving skills, and conflict resolution skills to better manage daily stressors  Diagnosis:   ICD-10-CM   1. Mood disorder ([place])  F39      Plan: Patient today showing  some motivation and with encouragement, participating more actively in session as we worked on her depression, anxiety, reported thoughts of vague SI with no plan, and frustration.  Did well in venting more of her thoughts and feelings today regarding her anxiousness and depressive thoughts, her vague thoughts of self-harm, and frustration.  Very aware of some long term negativity, hurt, and resentment from past that still holds her back today, and we continue to work on this.  Has again contracted for safety.  Did show a little more hopefulness during session as she was talking and engaging more, however I know she often has difficulty holding onto positives when she is struggling so much with her sadness and depression.  No tears today and did well with good eye contact and actually engaging in talking about options for herself and not just giving into the hopeless feelings.  Encouraged her and her practice of more positive behaviors including: Reflecting on any progress she makes more often, for every negative thought create 2 positives, consider alternative ways of looking at herself and her future that include potential positives, realize how certain things impact her mood very sharply, getting outside daily, realizing that in assuming the negatives this feeds her anxiety and depression, reduce her self negating, practice more positive self-care and self talk daily, be willing to put into action some of the more productive ways of interacting with others per our discussion in previous sessions and mentioned again today, practice viewing herself in more positive ways and find things about herself that she actually likes, believe in herself more in her ability to make changes even in difficult circumstances, practice more positive communication skills with others including active listening and "paying attention to how I say what I say", take small breaks as she is able during the day, being mindful of her self  talk and attitude when it goes in a negative direction and try to interrupt it bringing it towards more of a positive/encouraging direction, practice self calming strategies, look more for what might go right versus wrong, staying in contact with friend who is supportive, remain in the present focusing on what she can control or change, continue to work on past hurts and resentments that arise at difficult times more sharply, and feel encouraged by the strengths she shows working with goal-directed behaviors to move in a direction that supports improved emotional health.  Goal review and progress/challenges noted with patient.  Next appointment within 2 weeks.  This record has been created using [employer].  Chart creation errors have been sought, but may not always have been located and corrected.  Such creation errors do not reflect on the standard of medical care provided.  [name], LCSW

## 2021-09-25 NOTE — Telephone Encounter (Signed)
Received STD Forms. Placed in Traci's box 1/23

## 2021-09-26 ENCOUNTER — Other Ambulatory Visit (INDEPENDENT_AMBULATORY_CARE_PROVIDER_SITE_OTHER): Payer: 59

## 2021-09-26 ENCOUNTER — Ambulatory Visit: Payer: 59 | Admitting: Neurology

## 2021-09-26 ENCOUNTER — Telehealth: Payer: Self-pay | Admitting: Psychiatry

## 2021-09-26 ENCOUNTER — Encounter: Payer: Self-pay | Admitting: Neurology

## 2021-09-26 VITALS — BP 117/75 | HR 79 | Ht 67.0 in | Wt 260.8 lb

## 2021-09-26 DIAGNOSIS — M542 Cervicalgia: Secondary | ICD-10-CM | POA: Diagnosis not present

## 2021-09-26 DIAGNOSIS — R519 Headache, unspecified: Secondary | ICD-10-CM | POA: Diagnosis not present

## 2021-09-26 DIAGNOSIS — G8929 Other chronic pain: Secondary | ICD-10-CM

## 2021-09-26 DIAGNOSIS — Z823 Family history of stroke: Secondary | ICD-10-CM | POA: Diagnosis not present

## 2021-09-26 DIAGNOSIS — R413 Other amnesia: Secondary | ICD-10-CM

## 2021-09-26 LAB — TSH: TSH: 1.73 u[IU]/mL (ref 0.35–5.50)

## 2021-09-26 LAB — C-REACTIVE PROTEIN: CRP: <1 mg/dL (ref 0.5–20.0)

## 2021-09-26 LAB — SEDIMENTATION RATE: Sed Rate: 3 mm/hr (ref 0–20)

## 2021-09-26 NOTE — Patient Instructions (Addendum)
MRI and MRA of brain X-ray of cervical spine Sed rate, CRP, TSH Further recommendations pending results. Recommend gettubg eye exam Follow up after testing

## 2021-09-26 NOTE — Telephone Encounter (Signed)
Received fax from [employer] regarding Ashley Stephens. Completion needed for FMLA form. Placed in Traci's box.

## 2021-09-28 ENCOUNTER — Encounter: Payer: Self-pay | Admitting: Physician Assistant

## 2021-09-28 ENCOUNTER — Other Ambulatory Visit: Payer: Self-pay

## 2021-09-28 ENCOUNTER — Telehealth: Payer: Self-pay | Admitting: Neurology

## 2021-09-28 ENCOUNTER — Ambulatory Visit: Payer: 59 | Admitting: Physician Assistant

## 2021-09-28 VITALS — BP 127/100 | Temp 98.2°F | Resp 16 | Ht 67.0 in | Wt 261.6 lb

## 2021-09-28 DIAGNOSIS — R69 Illness, unspecified: Secondary | ICD-10-CM | POA: Diagnosis not present

## 2021-09-28 DIAGNOSIS — E119 Type 2 diabetes mellitus without complications: Secondary | ICD-10-CM

## 2021-09-28 DIAGNOSIS — F332 Major depressive disorder, recurrent severe without psychotic features: Secondary | ICD-10-CM

## 2021-09-28 DIAGNOSIS — E1165 Type 2 diabetes mellitus with hyperglycemia: Secondary | ICD-10-CM | POA: Diagnosis not present

## 2021-09-28 DIAGNOSIS — Z0100 Encounter for examination of eyes and vision without abnormal findings: Secondary | ICD-10-CM | POA: Diagnosis not present

## 2021-09-28 DIAGNOSIS — M5481 Occipital neuralgia: Secondary | ICD-10-CM

## 2021-09-28 MED ORDER — TRULICITY 3 MG/0.5ML ~~LOC~~ SOAJ: 3.0000 mg | SUBCUTANEOUS | 2 refills | Status: DC

## 2021-09-28 NOTE — Telephone Encounter (Signed)
Patient is returning a call to someone, she thinks it is about results

## 2021-09-28 NOTE — Telephone Encounter (Signed)
See results note. 

## 2021-09-28 NOTE — Progress Notes (Signed)
LMOVM for pt to give the office a call back.

## 2021-09-28 NOTE — Progress Notes (Signed)
[facility] [place], [place] [address]  Internal MEDICINE  Office Visit Note  Patient Name: Ashley Stephens  [id number]  [id number]  Date of Service: 10/03/2021  Chief Complaint  Patient presents with   Follow-up   Depression   Hypertension   Diabetes   Ear Pain    Left ear has been painful - had an ear infection about 1.5 months ago in the same ear   Quality Metric Gaps    Eye Exam    HPI Pt is here for routine follow up -Had appt with neurology last week. Has MRI next week. Headaches thought to be possibly cervicogenic -Still recovering from cold and left ear infection. Still pressure on left ear, but this is also the side that her chronic headaches have been on and just wants to make sure it is not her ear contributing anymore -Psych increased her trileptal due to worsening depression for the last month. Sees therapist on Monday and goes back to see [name] on feb 7th. -Will increase trulicity to aid BG control and wt loss  Current Medication: Outpatient Encounter Medications as of 09/28/2021  Medication Sig Note   ALPRAZolam (XANAX) 0.25 MG tablet Take 1 tablet (0.25 mg total) by mouth 2 (two) times daily as needed. for anxiety    cholecalciferol (VITAMIN D3) 25 MCG (1000 UNIT) tablet Take 1,000 Units by mouth daily.    Cyanocobalamin (VITAMIN B 12 PO) Take by mouth.    desvenlafaxine (PRISTIQ) 100 MG 24 hr tablet Take 1 tablet (100 mg total) by mouth every morning.    desvenlafaxine (PRISTIQ) 50 MG 24 hr tablet Take 1 tablet (50 mg total) by mouth every evening.    Dulaglutide (TRULICITY) 3 JO/8.4ZY SOPN Inject 3 mg as directed once a week.    hydrOXYzine (VISTARIL) 50 MG capsule Take 1 capsule by mouth three times daily as needed    lamoTRIgine (LAMICTAL) 150 MG tablet Take 1 tablet (150 mg total) by mouth 2 (two) times daily.    levothyroxine (SYNTHROID) 25 MCG tablet TAKE 1 TABLET BY MOUTH ONCE DAILY BEFORE BREAKFAST.     lurasidone (LATUDA) 80 MG TABS tablet Take 1 tablet (80 mg total) by mouth daily with supper.    omeprazole (PRILOSEC) 40 MG capsule Take 1 capsule (40 mg total) by mouth daily. (Patient taking differently: Take 40 mg by mouth daily as needed.) 03/21/2021: Prescribed protonix    Oxcarbazepine (TRILEPTAL) 300 MG tablet Take 1 tablet (300 mg total) by mouth every morning AND 2 tablets (600 mg total) at bedtime.    rosuvastatin (CRESTOR) 5 MG tablet Take 1 tablet (5 mg total) by mouth daily.    [DISCONTINUED] Dulaglutide (TRULICITY) 1.5 SA/6.3KZ SOPN INJECT 1 PEN SUBCUTANEOUSLY ONCE A WEEK    propranolol (INDERAL) 20 MG tablet Take 1 tablet (20 mg total) by mouth 3 (three) times daily as needed.    No facility-administered encounter medications on file as of 09/28/2021.    Surgical History: Past Surgical History:  Procedure Laterality Date   ABDOMINAL HYSTERECTOMY  2013   BARIATRIC SURGERY     BREAST BIOPSY  1995   COLONOSCOPY WITH PROPOFOL N/A 10/28/2020   Procedure: COLONOSCOPY WITH PROPOFOL;  Surgeon: Jonathon Bellows, MD;  Location: [facility] ENDOSCOPY;  Service: Gastroenterology;  Laterality: N/A;   GASTRIC BYPASS  2012   TUBAL LIGATION  1996   tummy tuck  05/27/2020    Medical History: Past Medical History:  Diagnosis Date   Anxiety  Depression    Diabetes mellitus without complication ([place])    Gastric ulcer    GERD (gastroesophageal reflux disease)    Hypertension    Osteoarthritis    Thyroid disorder     Family History: Family History  Problem Relation Age of Onset   Breast cancer Maternal Grandmother    Diabetes Maternal Grandmother    Heart disease Maternal Grandmother    Hypertension Maternal Grandmother    Heart disease Brother    Hypertension Brother    Hyperlipidemia Brother    Diabetes Maternal Aunt    Heart disease Maternal Aunt    Hypertension Maternal Aunt    Hyperlipidemia Maternal Aunt    Diabetes Maternal Aunt    Heart disease Maternal Aunt    Hypertension  Maternal Aunt    Hyperlipidemia Maternal Aunt    Diabetes Maternal Aunt    Heart disease Maternal Aunt    Hypertension Maternal Aunt    Hyperlipidemia Maternal Aunt     Social History   Socioeconomic History   Marital status: Single    Spouse name: Not on file   Number of children: 1   Years of education: Not on file   Highest education level: Not on file  Occupational History   Occupation: Engineer, drilling  Tobacco Use   Smoking status: Never   Smokeless tobacco: Never  Vaping Use   Vaping Use: Never used  Substance and Sexual Activity   Alcohol use: No    Alcohol/week: 0.0 standard drinks   Drug use: No   Sexual activity: Yes    Birth control/protection: Surgical  Other Topics Concern   Not on file  Social History Narrative   Not on file   Social Determinants of Health   Financial Resource Strain: Not on file  Food Insecurity: Not on file  Transportation Needs: Not on file  Physical Activity: Not on file  Stress: Not on file  Social Connections: Not on file  Intimate Partner Violence: Not on file      Review of Systems  Constitutional:  Negative for chills, fatigue and unexpected weight change.  HENT:  Positive for ear pain. Negative for congestion, postnasal drip, rhinorrhea, sneezing and sore throat.   Eyes:  Negative for redness.  Respiratory:  Negative for cough, chest tightness and shortness of breath.   Cardiovascular:  Negative for chest pain and palpitations.  Gastrointestinal:  Negative for abdominal pain, constipation, diarrhea, nausea and vomiting.  Genitourinary:  Negative for dysuria and frequency.  Musculoskeletal:  Negative for arthralgias, back pain, joint swelling and neck pain.  Skin:  Negative for rash.  Neurological:  Positive for headaches. Negative for tremors and numbness.  Hematological:  Negative for adenopathy. Does not bruise/bleed easily.  Psychiatric/Behavioral:  Negative for behavioral problems (Depression), sleep disturbance  and suicidal ideas. The patient is not nervous/anxious.    Vital Signs: BP (!) 127/100    Temp 98.2 F (36.8 C)    Resp 16    Ht 5\' 7"  (1.702 m)    Wt 261 lb 9.6 oz (118.7 kg)    BMI 40.97 kg/m    Physical Exam Vitals and nursing note reviewed.  Constitutional:      General: She is not in acute distress.    Appearance: She is well-developed. She is obese. She is not diaphoretic.  HENT:     Head: Normocephalic and atraumatic.     Comments: Tender to palpation over occipital region    Mouth/Throat:     Pharynx: No oropharyngeal  exudate.  Eyes:     Pupils: Pupils are equal, round, and reactive to light.  Neck:     Thyroid: No thyromegaly.     Vascular: No JVD.     Trachea: No tracheal deviation.  Cardiovascular:     Rate and Rhythm: Normal rate and regular rhythm.     Heart sounds: Normal heart sounds. No murmur heard.   No friction rub. No gallop.  Pulmonary:     Effort: Pulmonary effort is normal. No respiratory distress.     Breath sounds: No wheezing or rales.  Chest:     Chest wall: No tenderness.  Abdominal:     General: Bowel sounds are normal.     Palpations: Abdomen is soft.  Musculoskeletal:        General: Normal range of motion.     Cervical back: Normal range of motion and neck supple.  Lymphadenopathy:     Cervical: No cervical adenopathy.  Skin:    General: Skin is warm and dry.  Neurological:     General: No focal deficit present.     Mental Status: She is alert and oriented to person, place, and time.     Cranial Nerves: No cranial nerve deficit.  Psychiatric:        Behavior: Behavior normal.        Thought Content: Thought content normal.        Judgment: Judgment normal.       Assessment/Plan: 1. Type 2 diabetes mellitus with hyperglycemia, without long-term current use of insulin (HCC) Too ealry for reapt A1c and has been stable, will increase trulicity to help with BG control and wt loss. - Dulaglutide (TRULICITY) 3 KT/6.2BW SOPN; Inject 3  mg as directed once a week.  Dispense: 2 mL; Refill: 2  2. Diabetic eye exam [facility]) - Ambulatory referral to Ophthalmology  3. Bilateral occipital neuralgia Followed by neurology  4. Severe episode of recurrent major depressive disorder, without psychotic features ([place]) Followed by psych   General Counseling: Alfredia Client understanding of the findings of todays visit and agrees with plan of treatment. I have discussed any further diagnostic evaluation that may be needed or ordered today. We also reviewed her medications today. she has been encouraged to call the office with any questions or concerns that should arise related to todays visit.    Orders Placed This Encounter  Procedures   Ambulatory referral to Ophthalmology    Meds ordered this encounter  Medications   Dulaglutide (TRULICITY) 3 LS/9.3TD SOPN    Sig: Inject 3 mg as directed once a week.    Dispense:  2 mL    Refill:  2    This patient was seen by Drema Dallas, PA-C in collaboration with Dr. Clayborn Bigness as a part of collaborative care agreement.   Total time spent:30 Minutes Time spent includes review of chart, medications, test results, and follow up plan with the patient.      Dr Lavera Guise Internal medicine

## 2021-10-02 ENCOUNTER — Telehealth: Payer: Self-pay

## 2021-10-02 ENCOUNTER — Telehealth: Payer: Self-pay | Admitting: Physician Assistant

## 2021-10-02 NOTE — Telephone Encounter (Signed)
Ophthalmology referral sent via [facility] to [facility]

## 2021-10-02 NOTE — Telephone Encounter (Signed)
Duplicate message. 

## 2021-10-02 NOTE — Telephone Encounter (Signed)
Received fax from [employer] re: Ashley Stephens. Placed on Traci's desk.

## 2021-10-02 NOTE — Telephone Encounter (Signed)
Forms completed and given to Janett Billow to review and sign

## 2021-10-02 NOTE — Telephone Encounter (Signed)
This is a duplicate of what was completed today and given to Janett Billow to review and sign

## 2021-10-03 ENCOUNTER — Ambulatory Visit: Payer: 59 | Admitting: Psychiatry

## 2021-10-03 ENCOUNTER — Other Ambulatory Visit: Payer: Self-pay

## 2021-10-03 ENCOUNTER — Telehealth: Payer: Self-pay

## 2021-10-03 DIAGNOSIS — F39 Unspecified mood [affective] disorder: Secondary | ICD-10-CM | POA: Diagnosis not present

## 2021-10-03 DIAGNOSIS — R69 Illness, unspecified: Secondary | ICD-10-CM | POA: Diagnosis not present

## 2021-10-03 MED ORDER — TRULICITY 1.5 MG/0.5ML ~~LOC~~ SOAJ: 1.5000 mg | SUBCUTANEOUS | 1 refills | Status: DC

## 2021-10-03 NOTE — Progress Notes (Signed)
[facility] Counselor/Therapist Progress Note  Patient ID: Ashley Stephens, MRN: [id number],    Date: 10/03/2021  Time Spent: 55 minutes   Treatment Type: Individual Therapy  Reported Symptoms: depression, anxiety (some improvement)  Mental Status Exam:  Appearance:   Casual     Behavior:  Appropriate, Sharing, and some motivation  Motor:  Normal  Speech/Language:   Clear and Coherent  Affect:  Depressed and anxious  Mood:  anxious and depressed  Thought process:  goal directed  Thought content:    Overthinking "since I'm not working I just sit around and thinking negatively like I'm not going to get any better".  Sensory/Perceptual disturbances:    WNL  Orientation:  oriented to person, place, time/date, situation, day of week, month of year, year, and stated date of Jan. 31, 2023  Attention:  Good  Concentration:  Good  Memory:  "Sometimes I forget my meds, not other things but just my meds." Discussed another alternative location to put her meds and she agrees  [employer] of knowledge:   Good  Insight:    Fair  Judgment:   Good  Impulse Control:  Good   Risk Assessment: Danger to Self:  No Self-injurious Behavior: No Danger to Others: No Duty to Warn:no Physical Aggression / Violence:No  Access to Firearms a concern: No  Gang Involvement:No   Subjective: Patient in today reporting depression and some anxiety (improving). Reports overthinking "since I'm not working I just sit around and am thinking negatively like I'm not going to get any better." States she can't tell any increase in her dosage of meds and reports "I've not missed taking it very much." At one point acknowledges that working at home every day is not really helping and may consider (if allowed) maybe being in office 2 days a week and other days at home.  Doesn't want to work 5 days in office but maybe 2 "might help I admit." Questions people and whether they judge her, quick to make assumptions.   Processed this more at length today with patient as she seemed to be more consumed with it.  Denies any SI.  Did discuss more today about her need for more relationships and healthy friendships.  Also, to make efforts to not impulsively be prejudging situations that might could actually have positive effect on her.  Sees changes that she desires but a lot of difficulty in doing anything different or trying new strategies.  Interventions: Solution-Oriented/Positive Psychology, Ego-Supportive, and Insight-Oriented  Treatment Goals: Goals remain on treatment plan as patient works with strategies to achieve her goals.  Progress is noted each session and documented in "Progress" section of Note. Long term goal: Develop the ability to recognize, accept, and cope with feelings of depression and anxiety. Short term goal: Learn and implement personal skills for managing stress, solving daily problems, and resolving conflicts effectively. Strategy: Teach patient calming strategies, problem solving skills, and conflict resolution skills to better manage daily stressors  Diagnosis:   ICD-10-CM   1. Mood disorder ([place])  F39      Plan:   Patient today showing some motivation and did participate more in session today than the prior session.  Anxiety has improved some.  Work further on her depression, looking at what holds her back, and her tendency to prejudge people and situations that could actually be positive for her.  Vented a lot of her thoughts and concerns especially as to how she thinks people at work and  other places judge her.  Denies any SI.  Does share that in being off from work, "I just sit around and think about negative things at home" and finds it hard to focus on changing certain ways of looking at things or trying new things.  Did encourage her to be willing to try looking at things a little differently including not assuming "the worst" and several situations, and after talking it through,  she seemed to agree that she needed to be able to do that.  Encouraged her also in trying to practice more positive behaviors including: Reflecting on progress she makes more often, for every negative thought create 2 positives, consider alternative ways of looking at herself and her future that include potential positives, realize how certain things impact her mood very sharply, getting outside daily, realizing that in assuming the negatives this feeds her anxiety and depression, reduce her self negating, practice more positive self-care and self talk daily, be willing to put into action some of the more productive ways of interacting with others per our discussion in sessions, practice viewing herself in more positive ways and find things about herself that she actually likes, believe in herself more in her ability to make changes even in difficult circumstances, practice more positive communication skills with others including active listening and "paying attention to how I say what I say", take small breaks as she is able during the day, being mindful of her self talk and attitude when it goes in a negative direction and try to interrupt it bringing it towards more of a positive/encouraging direction, practice self calming strategies, look more for what might go right versus wrong, staying in contact with friend who is supportive, remain in the present focusing on what she can control or change, continue to work on past hurts and resentments that arise at difficult times more sharply, and recognize the strength she shows working with goal-directed behaviors to move in a direction that supports improved emotional health.   Review and progress/challenges noted with patient.  Next appointment within 2 weeks.  This record has been created using [employer].  Chart creation errors have been sought, but may not always have been located and corrected.  Such creation errors do not reflect on the standard of  medical care provided.  [name], LCSW

## 2021-10-03 NOTE — Telephone Encounter (Signed)
Pt informed that since pharmacy was out of 3 mg that per Lauren we will stay at 1.5 mg for now until the pharmacy gets some in stock.  Sent prescription to her pharmacy for Trulicity 1.5 mg

## 2021-10-04 DIAGNOSIS — L700 Acne vulgaris: Secondary | ICD-10-CM | POA: Diagnosis not present

## 2021-10-04 NOTE — Telephone Encounter (Signed)
error 

## 2021-10-06 ENCOUNTER — Ambulatory Visit
Admission: RE | Admit: 2021-10-06 | Discharge: 2021-10-06 | Disposition: A | Discharge status: Home / Self Care | Payer: 59 | Source: Ambulatory Visit | Attending: Neurology | Admitting: Neurology

## 2021-10-06 ENCOUNTER — Other Ambulatory Visit: Payer: Self-pay

## 2021-10-06 DIAGNOSIS — R519 Headache, unspecified: Secondary | ICD-10-CM

## 2021-10-06 DIAGNOSIS — Z823 Family history of stroke: Secondary | ICD-10-CM

## 2021-10-06 IMAGING — MR MR HEAD W/O CM
9 of 12 series · 32 of 48 positions shown · non-contrast
Comparison: No pertinent prior exam.

CLINICAL DATA: Chronic left sided headache; family history Cerbral
Embolism

EXAM:
MRI HEAD WITHOUT CONTRAST
MRA HEAD WITHOUT CONTRAST
TECHNIQUE: Multiplanar, multi-echo pulse sequences of the brain and surrounding
structures were acquired without intravenous contrast. Angiographic
images of the Circle of Willis were acquired using MRA technique
without intravenous contrast.

[Series 5: T1 · sagittal · 4.0mm · 0.75mm/px · 2 of 31 slices shown (1 of 2)]
[im 1/31]
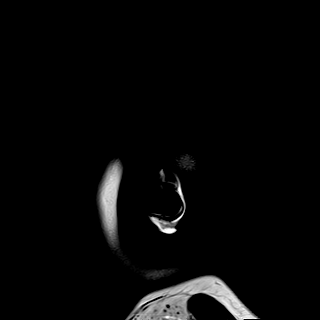
[im 31/31]
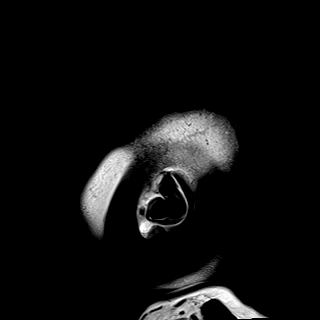

[Series 9: [id number]-[id number] · axial · 0.6mm · 0.26mm/px · z∈[-64,-28]mm · 4 of 162 slices shown]
[im 1/162]
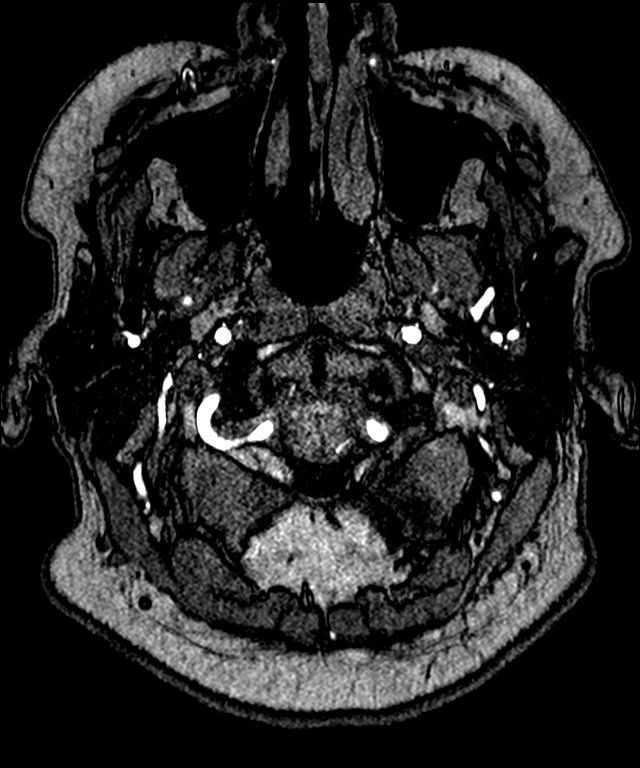
[im 21/162]
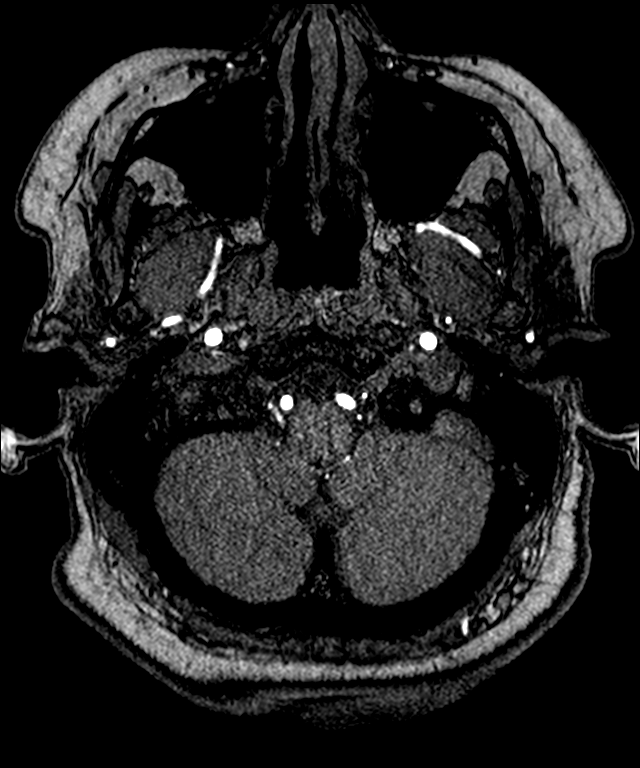
[im 41/162]
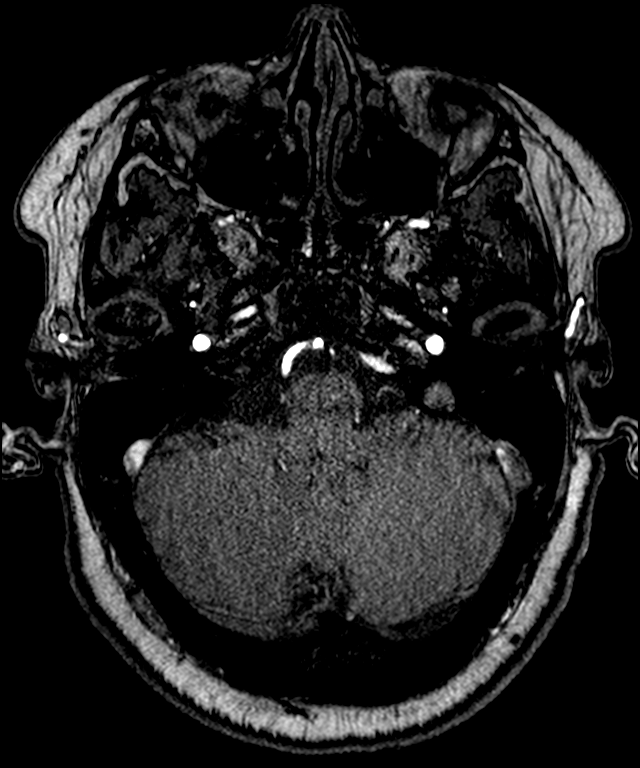
[im 61/162]
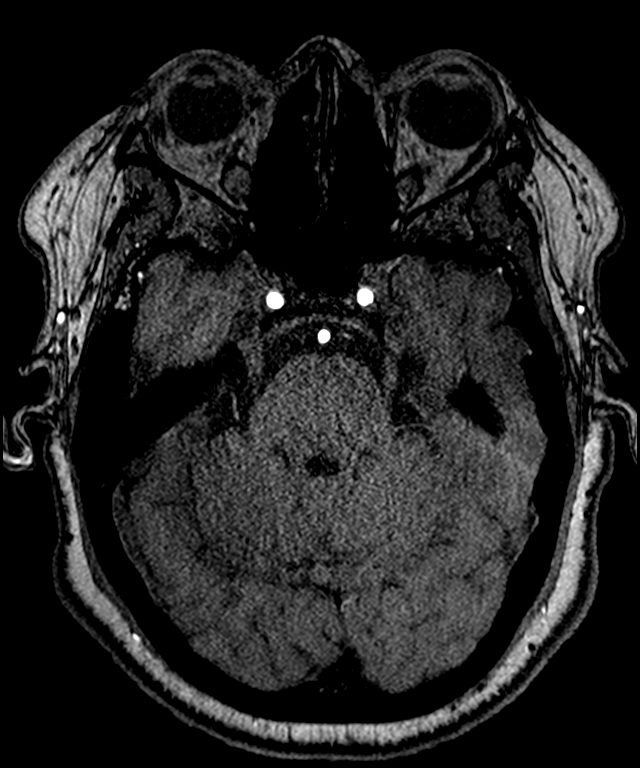

[Series 13: DWI · axial · 3.0mm · 0.94mm/px · z∈[-62,+78]mm · 8 of 158 slices shown (1 of 3)]
[im 1/158]
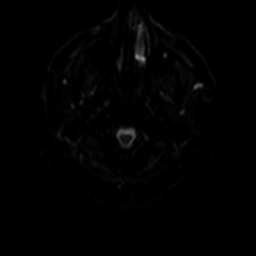
[im 23/158]
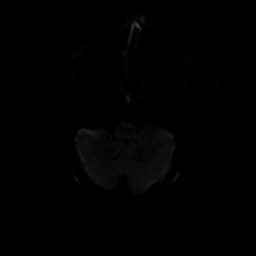
[im 45/158]
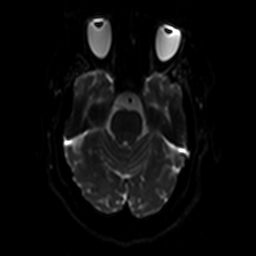
[im 68/158]
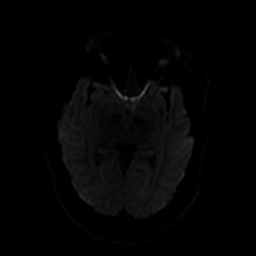
[im 90/158]
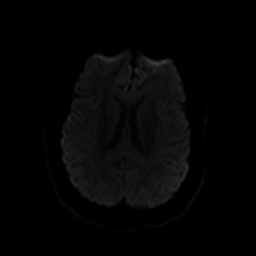
[im 113/158]
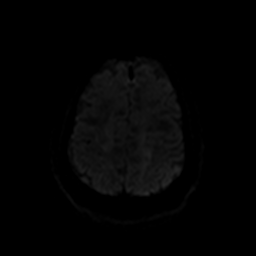
[im 135/158]
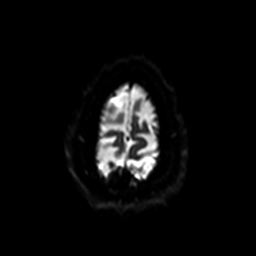
[im 158/158]
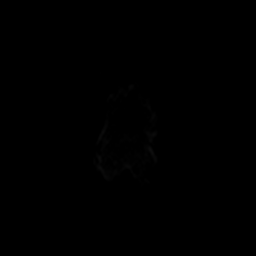

[Series 17: DWI · coronal · 5.0mm · 1.44mm/px · 3 of 62 slices shown (2 of 3)]
[im 1/62]
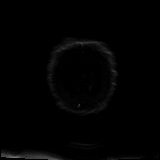
[im 31/62]
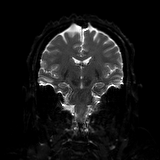
[im 62/62]
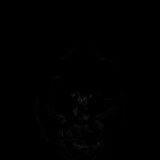

[Series 18: DWI · coronal · 5.0mm · 1.44mm/px · 2 of 31 slices shown (3 of 3)]
[im 1/31]
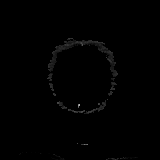
[im 31/31]
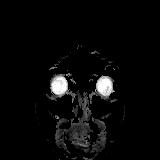

[Series 19: T2 · axial · 4.0mm · 0.36mm/px · z∈[-55,+88]mm · 2 of 29 slices shown (1 of 2)]
[im 1/29]
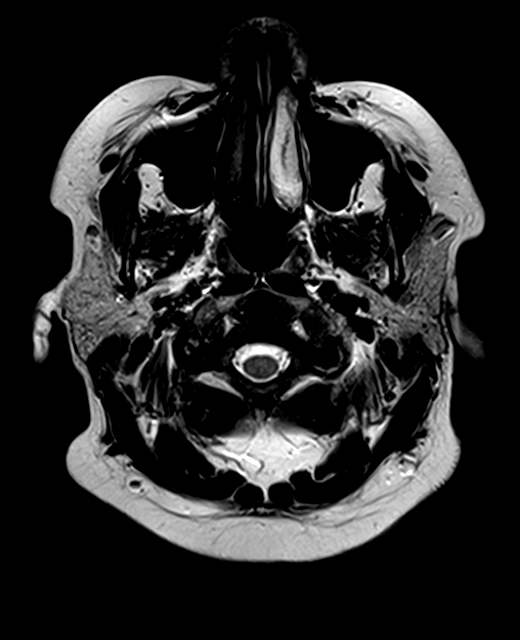
[im 29/29]
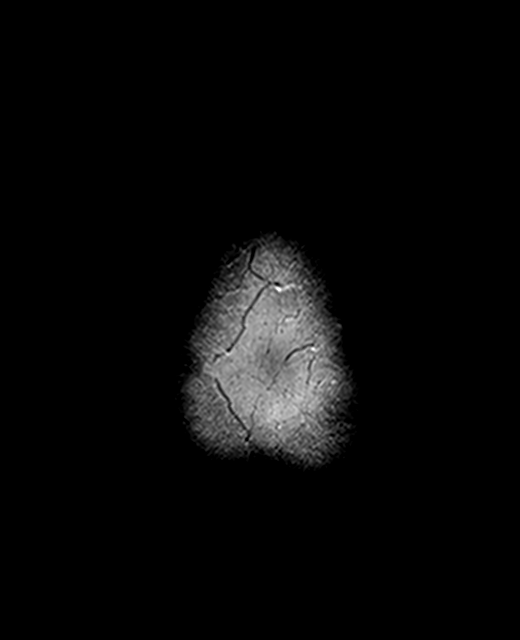

[Series 20: FLAIR · axial · 3.0mm · 0.72mm/px · 1 of 26 slices shown]
[im 1/26]
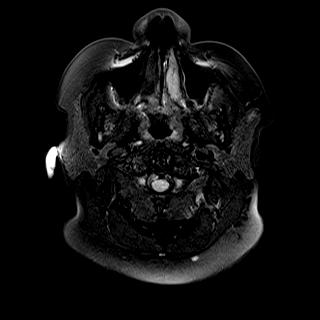

[Series 23: T1 · axial · 1.0mm · 0.94mm/px · z∈[-69,+88]mm · 8 of 160 slices shown (2 of 2)]
[im 1/160]
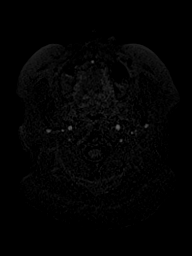
[im 23/160]
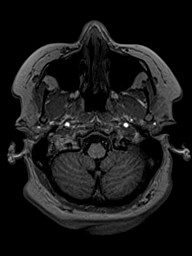
[im 46/160]
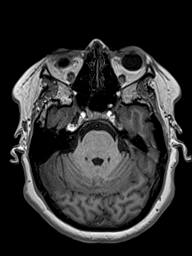
[im 69/160]
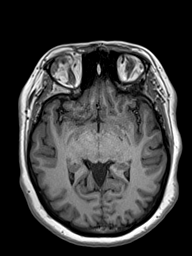
[im 91/160]
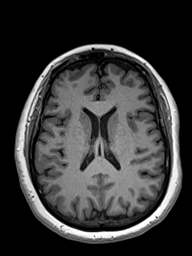
[im 114/160]
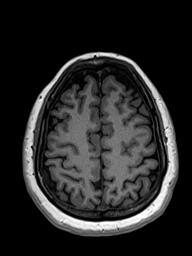
[im 137/160]
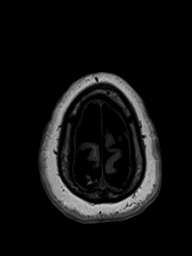
[im 160/160]
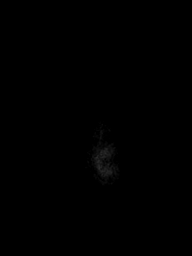

[Series 24: T2 · coronal · 4.5mm · 0.36mm/px · 2 of 31 slices shown (2 of 2)]
[im 1/31]
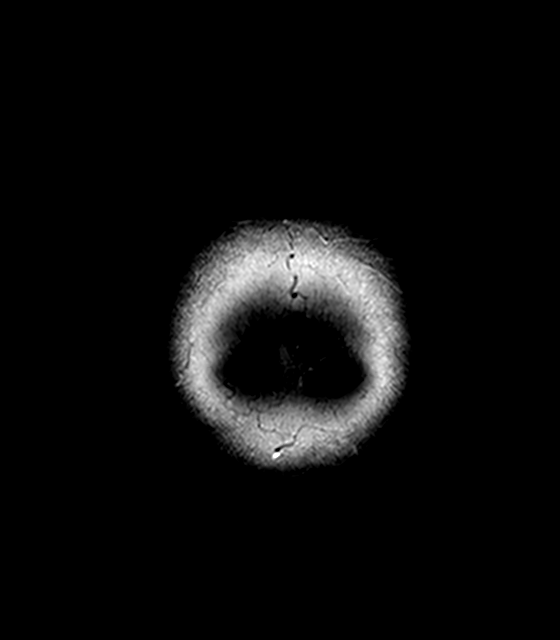
[im 31/31]
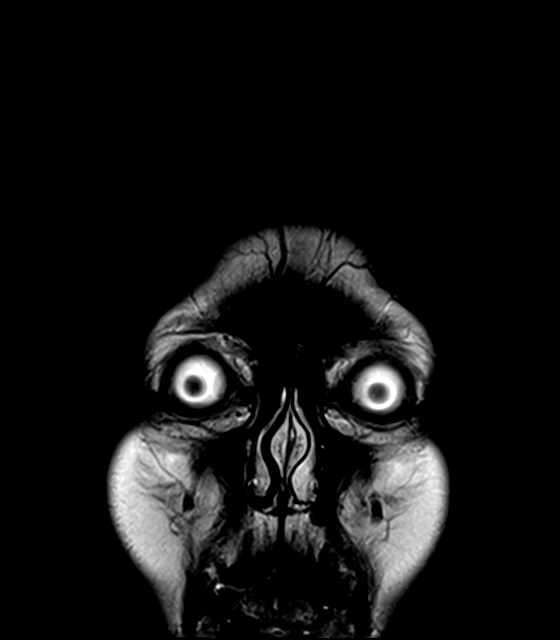

[32 of 48 positions shown; findings below may reference images not displayed]

FINDINGS: MRI HEAD FINDINGS

Brain: No acute infarction, hemorrhage, hydrocephalus, extra-axial
collection or mass lesion.

Vascular: See below.

Skull and upper cervical spine: Normal marrow signal.

Sinuses/Orbits: Clear sinuses.  Unremarkable orbits.

Other: No mastoid effusions.

MRA HEAD FINDINGS

Anterior circulation: Bilateral intracranial ICAs, MCAs, and ACAs
are patent without proximal hemodynamically significant stenosis. No
aneurysm identified.

Posterior circulation: Intradural vertebral arteries, basilar
artery, and posterior cerebral arteries are patent without proximal
hemodynamically significant stenosis. No aneurysm identified.
IMPRESSION: 1. Normal brain MRI.  No evidence of acute intracranial abnormality.
2. No large vessel occlusion proximal hemodynamically significant
stenosis or aneurysm.

## 2021-10-10 ENCOUNTER — Telehealth (INDEPENDENT_AMBULATORY_CARE_PROVIDER_SITE_OTHER): Payer: 59 | Admitting: Psychiatry

## 2021-10-10 ENCOUNTER — Encounter: Payer: Self-pay | Admitting: Psychiatry

## 2021-10-10 DIAGNOSIS — F411 Generalized anxiety disorder: Secondary | ICD-10-CM | POA: Diagnosis not present

## 2021-10-10 DIAGNOSIS — R69 Illness, unspecified: Secondary | ICD-10-CM | POA: Diagnosis not present

## 2021-10-10 DIAGNOSIS — F39 Unspecified mood [affective] disorder: Secondary | ICD-10-CM | POA: Diagnosis not present

## 2021-10-10 DIAGNOSIS — F5101 Primary insomnia: Secondary | ICD-10-CM | POA: Diagnosis not present

## 2021-10-10 MED ORDER — DESVENLAFAXINE SUCCINATE ER 100 MG PO TB24: 100.0000 mg | ORAL_TABLET | ORAL | 1 refills | Status: DC

## 2021-10-10 MED ORDER — OXCARBAZEPINE 300 MG PO TABS: 300.0000 mg | ORAL_TABLET | Freq: Two times a day (BID) | ORAL | 0 refills | Status: DC

## 2021-10-10 MED ORDER — LURASIDONE HCL 120 MG PO TABS: 120.0000 mg | ORAL_TABLET | Freq: Every day | ORAL | 1 refills | Status: DC

## 2021-10-10 MED ORDER — DESVENLAFAXINE SUCCINATE ER 50 MG PO TB24: 50.0000 mg | ORAL_TABLET | Freq: Every evening | ORAL | 1 refills | Status: DC

## 2021-10-10 NOTE — Progress Notes (Signed)
Ashley Stephens [id number] October 13, 1975 46 y.o.  Virtual Visit via Video Note  I connected with pt @ on 10/10/21 at 12:00 PM EST by a video enabled telemedicine application and verified that I am speaking with the correct person using two identifiers.   I discussed the limitations of evaluation and management by telemedicine and the availability of in person appointments. The patient expressed understanding and agreed to proceed.  I discussed the assessment and treatment plan with the patient. The patient was provided an opportunity to ask questions and all were answered. The patient agreed with the plan and demonstrated an understanding of the instructions.   The patient was advised to call back or seek an in-person evaluation if the symptoms worsen or if the condition fails to improve as anticipated.  I provided 35 minutes of non-face-to-face time during this encounter.  The patient was located at home.  The provider was located at home.   [name], PMHNP   Subjective:   Patient ID:  Ashley Stephens is a 13 y.o. (DOB 08/13/1976) female.  Chief Complaint:  Chief Complaint  Patient presents with   Anxiety   Depression   Paranoid    HPI [name] presents for follow-up of anxiety, mood disturbance, and insomnia. She reports she is "about the same." She reports that she is getting up and bathing. She reports that she is motivated when she has to do something. She reports that motivation has been low for self-care. She reports continued negative thoughts. She reports continued sad mood and thinking about sad things and "death." She reports, "I still feel hopeless." She reports anxiety in response to the unknown. She reports catastrophic thinking and rumination. She reports that she has been taking Xanax more, twice a day. She reports panic attacks when she goes to the store and feels that "everybody is looking at me but I know they're not." She reports  feeling she is under surveillance and people are "watching for me to make a mistake." She reports that she has been having worsening s/s since right after Thanksgiving. She reports that she has not had the energy to cook or go get food. She reports that she has ordered pizza and eaten it for a few days. Occasionally will get chips from a gas station. Denies intrusive thoughts. Denies irritability. Denies mood lability or elevated moods. Denies impulsivity or risky behavior. She reports that she has been socially withdrawn and not talking to anyone other than her daughter and a close friend. She reports that she has been trying to sleep more to avoid negative feelings. She reports that she awakens around 3-4 am and is unable to return to sleep.   Denies current SI. "I do feel like one day..." Denies suicidal plan or intent. Contracts for safety.  She reports that she bought a pill box and set those up.   She denies any change in response to increase in [place].   Past medication trials: Wellbutrin Effexor Pristiq-effective Lamictal-effective for mood Latuda Xanax- Usually takes at night Propanolol- Effective Hydroxyzine Rexulti Gabapentin- prescribed for back pain. Dizziness, headaches. Lithium Trileptal  Review of Systems:  Review of Systems  Musculoskeletal:  Negative for gait problem.  Neurological:  Positive for headaches.  Psychiatric/Behavioral:         Please refer to HPI   Medications: I have reviewed the patient's current medications.  Current Outpatient Medications  Medication Sig Dispense Refill   Dulaglutide (TRULICITY) 1.5 QI/6.9GE SOPN Inject 1.5 mg into  the skin once a week. Inject 1.5 mg into skin once a week 6 mL 1   ALPRAZolam (XANAX) 0.25 MG tablet Take 1 tablet (0.25 mg total) by mouth 2 (two) times daily as needed. for anxiety 45 tablet 5   cholecalciferol (VITAMIN D3) 25 MCG (1000 UNIT) tablet Take 1,000 Units by mouth daily.     Cyanocobalamin (VITAMIN B 12  PO) Take by mouth.     desvenlafaxine (PRISTIQ) 100 MG 24 hr tablet Take 1 tablet (100 mg total) by mouth every morning. 90 tablet 1   desvenlafaxine (PRISTIQ) 50 MG 24 hr tablet Take 1 tablet (50 mg total) by mouth every evening. 90 tablet 1   Dulaglutide (TRULICITY) 3 HC/6.2BJ SOPN Inject 3 mg as directed once a week. 2 mL 2   hydrOXYzine (VISTARIL) 50 MG capsule Take 1 capsule by mouth three times daily as needed 270 capsule 0   lamoTRIgine (LAMICTAL) 150 MG tablet Take 1 tablet (150 mg total) by mouth 2 (two) times daily. 180 tablet 1   levothyroxine (SYNTHROID) 25 MCG tablet TAKE 1 TABLET BY MOUTH ONCE DAILY BEFORE BREAKFAST. 90 tablet 0   Lurasidone HCl (LATUDA) 120 MG TABS Take 1 tablet (120 mg total) by mouth daily with supper. 30 tablet 1   omeprazole (PRILOSEC) 40 MG capsule Take 1 capsule (40 mg total) by mouth daily. (Patient taking differently: Take 40 mg by mouth daily as needed.) 30 capsule 3   Oxcarbazepine (TRILEPTAL) 300 MG tablet Take 1 tablet (300 mg total) by mouth 2 (two) times daily. 180 tablet 0   propranolol (INDERAL) 20 MG tablet Take 1 tablet (20 mg total) by mouth 3 (three) times daily as needed. 270 tablet 1   rosuvastatin (CRESTOR) 5 MG tablet Take 1 tablet (5 mg total) by mouth daily. 90 tablet 3   No current facility-administered medications for this visit.    Medication Side Effects: None  Allergies: No Known Allergies  Past Medical History:  Diagnosis Date   Anxiety    Depression    Diabetes mellitus without complication (HCC)    Gastric ulcer    GERD (gastroesophageal reflux disease)    Hypertension    Osteoarthritis    Thyroid disorder     Family History  Problem Relation Age of Onset   Breast cancer Maternal Grandmother    Diabetes Maternal Grandmother    Heart disease Maternal Grandmother    Hypertension Maternal Grandmother    Heart disease Brother    Hypertension Brother    Hyperlipidemia Brother    Diabetes Maternal Aunt    Heart  disease Maternal Aunt    Hypertension Maternal Aunt    Hyperlipidemia Maternal Aunt    Diabetes Maternal Aunt    Heart disease Maternal Aunt    Hypertension Maternal Aunt    Hyperlipidemia Maternal Aunt    Diabetes Maternal Aunt    Heart disease Maternal Aunt    Hypertension Maternal Aunt    Hyperlipidemia Maternal Aunt     Social History   Socioeconomic History   Marital status: Single    Spouse name: Not on file   Number of children: 1   Years of education: Not on file   Highest education level: Not on file  Occupational History   Occupation: Engineer, drilling  Tobacco Use   Smoking status: Never   Smokeless tobacco: Never  Vaping Use   Vaping Use: Never used  Substance and Sexual Activity   Alcohol use: No    Alcohol/week: 0.0  standard drinks   Drug use: No   Sexual activity: Yes    Birth control/protection: Surgical  Other Topics Concern   Not on file  Social History Narrative   Not on file   Social Determinants of Health   Financial Resource Strain: Not on file  Food Insecurity: Not on file  Transportation Needs: Not on file  Physical Activity: Not on file  Stress: Not on file  Social Connections: Not on file  Intimate Partner Violence: Not on file    Past Medical History, Surgical history, Social history, and Family history were reviewed and updated as appropriate.   Please see review of systems for further details on the patient's review from today.   Objective:   Physical Exam:  There were no vitals taken for this visit.  Physical Exam Neurological:     Mental Status: She is alert and oriented to person, place, and time.     Cranial Nerves: No dysarthria.  Psychiatric:        Attention and Perception: Attention and perception normal.        Mood and Affect: Mood is anxious and depressed. Affect is blunt.        Speech: Speech normal.        Behavior: Behavior is cooperative.        Thought Content: Thought content is paranoid. Thought content  is not delusional. Thought content does not include homicidal or suicidal ideation. Thought content does not include homicidal or suicidal plan.        Cognition and Memory: Cognition and memory normal.        Judgment: Judgment normal.     Comments: Insight intact Hair is unkempt. Less groomed compared to her baseline    Lab Review:     Component Value Date/Time   NA 139 12/30/2020 0941   NA 142 12/03/2011 1059   K 4.6 12/30/2020 0941   K 3.4 (L) 12/03/2011 1059   CL 101 12/30/2020 0941   CL 107 12/03/2011 1059   CO2 21 12/30/2020 0941   CO2 27 12/03/2011 1059   GLUCOSE 88 12/30/2020 0941   GLUCOSE 92 07/12/2020 1526   GLUCOSE 66 12/03/2011 1059   BUN 7 12/30/2020 0941   BUN 4 (L) 12/03/2011 1059   CREATININE 0.84 12/30/2020 0941   CREATININE 0.82 12/03/2011 1059   CALCIUM 9.3 12/30/2020 0941   CALCIUM 8.5 12/03/2011 1059   PROT 6.4 12/30/2020 0941   ALBUMIN 4.3 12/30/2020 0941   AST 17 12/30/2020 0941   ALT 10 12/30/2020 0941   ALKPHOS 79 12/30/2020 0941   BILITOT 0.3 12/30/2020 0941   GFRNONAA >60 07/12/2020 1526   GFRNONAA >60 12/03/2011 1059   GFRAA 78 07/10/2019 0953   GFRAA >60 12/03/2011 1059       Component Value Date/Time   WBC 4.8 12/30/2020 0941   WBC 7.0 07/12/2020 1526   RBC 4.69 12/30/2020 0941   RBC 4.76 07/12/2020 1526   HGB 14.0 12/30/2020 0941   HCT 40.8 12/30/2020 0941   PLT 311 12/30/2020 0941   MCV 87 12/30/2020 0941   MCH 29.9 12/30/2020 0941   MCH 29.4 07/12/2020 1526   MCHC 34.3 12/30/2020 0941   MCHC 33.3 07/12/2020 1526   RDW 12.9 12/30/2020 0941   LYMPHSABS 1.4 12/30/2020 0941   MONOABS 0.6 07/12/2020 1526   EOSABS 0.1 12/30/2020 0941   BASOSABS 0.0 12/30/2020 0941    No results found for: POCLITH, LITHIUM   No results found for: PHENYTOIN,  PHENOBARB, VALPROATE, CBMZ   .res Assessment: Plan:   Pt seen for 35 minutes and time spent discussing current symptoms and possible treatment options. Discussed potential benefits,  risks, and side effects of increasing Latuda to 120 mg daily with supper to target mild paranoia. Discussed that higher dose may also be effective for anxiety. Will decrease Trileptal to 300 mg po BID since this increase has had limited benefit and headaches may have increased.  Continue Pristiq 100 mg po q am and 50 mg po q evening for mood and anxiety s/s.  Continue Lamictal 150 mg po BID for mood s/s.  Continue Alprazolam 0.25 mg po BID prn anxiety.  Continue Hydroxyzine as needed for anxiety.  Continue Propranolol prn anxiety.  Agree with setting up medication boxes for each day of the week to help with managing medication.  Informed pt that PHP and IOP at [facility] is in person and provided pt with contact information. Also discussed re-starting PHP with [facility] and provided that contact information as well. Discussed that a PHP program may be able to offer more intensive treatment and support.  Recommend continuing therapy with [name], LCSW.  Recommend extending leave of absence through 10/20/21 with tentative return to work date of 10/23/21. Will request that office staff send today's note to [employer]. Pt to follow-up with this provider on 10/20/21 or sooner if clinically indicated.  Patient advised to contact office with any questions, adverse effects, or acute worsening in signs and symptoms.   Keela was seen today for anxiety, depression and paranoid.  Diagnoses and all orders for this visit:  Mood disorder ([place]) -     Oxcarbazepine (TRILEPTAL) 300 MG tablet; Take 1 tablet (300 mg total) by mouth 2 (two) times daily. -     Lurasidone HCl (LATUDA) 120 MG TABS; Take 1 tablet (120 mg total) by mouth daily with supper. -     desvenlafaxine (PRISTIQ) 100 MG 24 hr tablet; Take 1 tablet (100 mg total) by mouth every morning. -     desvenlafaxine (PRISTIQ) 50 MG 24 hr tablet; Take 1 tablet (50 mg total) by mouth every evening.  Generalized anxiety disorder -      Oxcarbazepine (TRILEPTAL) 300 MG tablet; Take 1 tablet (300 mg total) by mouth 2 (two) times daily. -     desvenlafaxine (PRISTIQ) 100 MG 24 hr tablet; Take 1 tablet (100 mg total) by mouth every morning. -     desvenlafaxine (PRISTIQ) 50 MG 24 hr tablet; Take 1 tablet (50 mg total) by mouth every evening.  Primary insomnia -     Oxcarbazepine (TRILEPTAL) 300 MG tablet; Take 1 tablet (300 mg total) by mouth 2 (two) times daily.     Please see After Visit Summary for patient specific instructions.  Future Appointments  Date Time Provider [place]  10/12/2021 12:00 PM [name], LCSW [facility] None  10/16/2021  4:00 PM [name], LCSW [facility] None  10/20/2021 12:00 PM [name], PMHNP [facility] None  10/24/2021  9:00 AM Laretta Alstrom, Argie Ramming, RN [facility] None  10/30/2021  5:00 PM [name], LCSW [facility] None  11/08/2021  4:00 PM [name], LCSW [facility] None  12/28/2021  8:40 AM McDonough, Si Gaul, PA-C [facility] None    No orders of the defined types were placed in this encounter.     -------------------------------

## 2021-10-12 ENCOUNTER — Ambulatory Visit: Payer: 59 | Admitting: Psychiatry

## 2021-10-12 ENCOUNTER — Telehealth: Payer: Self-pay | Admitting: Psychiatry

## 2021-10-12 NOTE — Telephone Encounter (Signed)
-----   Message from Owens Loffler sent at 10/12/2021 10:22 AM EST ----- Fransisco Beau, [place] Specialist with [employer] received an email from [facility]. The email mentioned risks of suicide and that she wanted to be dead. Abigail Butts is  concerned by the email and wants you to be aware of this. Abigail Butts [place]'s phone number is [phone], extension [phone].

## 2021-10-12 NOTE — Telephone Encounter (Signed)
She reports that she went to the [facility] [place] earlier today. "I don't think nobody can help me." She reports that she did not check in or see someone and decided to leave and "didn't want to be out." She reports that she was also afraid she "might be locked up." She reports, "I did have a bad day yesterday." She reports that she did not sleep the night before. She reports that her motivation was better today. She reports "the negative thoughts have been the same." She reports that her pharmacy did not have Latuda 120 mg tabs and may have it in several days. She reports that pharmacy told her that Pristiq is out of stock and has been ordered. She reports that she is looking forward to going home. "I'm just super depressed." Denies current SI. Contracts for safety.   Discussed that she could take Latuda 80 mg 1.5 tabs until 120 mg tabs are available.   She reports that she looked into [facility] and that this seemed like a possible option.  She reports that she will call [facility] this week.   Informed pt that Fransisco Beau had called with concerns. Pt gave verbal consent for provider to call Abigail Butts back. (Signed information release on file for [employer]). Left message for Abigail Butts that provider spoke with pt and that pt reported that she had a "rough day" yesterday and was denying SI today and contracting for safety. Pt also said that she had looked at [facility] program online and had agreed to call them either this afternoon or tomorrow. Left return number if Abigail Butts needed to call back.

## 2021-10-14 ENCOUNTER — Other Ambulatory Visit: Payer: Self-pay | Admitting: Psychiatry

## 2021-10-14 DIAGNOSIS — F411 Generalized anxiety disorder: Secondary | ICD-10-CM

## 2021-10-16 ENCOUNTER — Other Ambulatory Visit: Payer: Self-pay

## 2021-10-16 ENCOUNTER — Ambulatory Visit (INDEPENDENT_AMBULATORY_CARE_PROVIDER_SITE_OTHER): Payer: 59 | Admitting: Psychiatry

## 2021-10-16 DIAGNOSIS — F39 Unspecified mood [affective] disorder: Secondary | ICD-10-CM | POA: Diagnosis not present

## 2021-10-16 DIAGNOSIS — R69 Illness, unspecified: Secondary | ICD-10-CM | POA: Diagnosis not present

## 2021-10-16 NOTE — Progress Notes (Signed)
[facility] Counselor/Therapist Progress Note  Patient ID: Ashley Stephens, MRN: [id number],    Date: 10/16/2021  Time Spent: 57 minutes   Treatment Type: Individual Therapy  Reported Symptoms: depression, anxiety  Mental Status Exam:  Appearance:   Casual     Behavior:  Appropriate, Sharing, and not too motivated except to come to my appt.  Motor:  Normal  Speech/Language:   Clear and Coherent  Affect:  Depressed and anxious  Mood:  anxious and depressed  Thought process:  goal directed  Thought content:    overthinking  Sensory/Perceptual disturbances:    WNL  Orientation:  oriented to person, place, time/date, situation, day of week, month of year, year, and stated date of Feb. 13, 2023  Attention:  Good  Concentration:  Fair  Memory:  [place] of knowledge:   Good  Insight:    Fair  Judgment:   Good and Fair  Impulse Control:  Good   Risk Assessment: Danger to Self:  No Self-injurious Behavior: No Danger to Others: No Duty to Warn:no Physical Aggression / Violence:No  Access to Firearms a concern: No  Gang Involvement:No   Subjective:  Patient in today reporting depression and anxiety. Denies any SI. States she has lots of negative thoughts in my head. Has been sleeping more and that's good because I don't think when I'm asleep. Frustrated that work made an error they are correcting with her disability payments. Negative but was able to eventually talk about her need to be with people more, being willing to make changes and not assume "I can't change" or that there's no hope for her getting better. Discussed this at length with patient, including her believing in herself more, staying on her meds, accepting support from others, getting good sleep, keeping expectations realistic, not jumping to conclusions nor assuming worst case scenarios, and begin looking for some positives even small ones. Needing to reset her self-talk to be more positive and we talked  about this in more detail today including the results she gets from negative self-talk. Overthinking and some obsessiveness in her thoughts, again have been slanted to the negative and she is to work on increased awareness of her self-negating  and interrupt it to replace with more self-acceptance and belief in herself to change.   Interventions: Solution-Oriented/Positive Psychology, Ego-Supportive, and Insight-Oriented  Treatment Goals: Goals remain on treatment plan as patient works with strategies to achieve her goals.  Progress is noted each session and documented in "Progress" section of Note. Long term goal: Develop the ability to recognize, accept, and cope with feelings of depression and anxiety. Short term goal: Learn and implement personal skills for managing stress, solving daily problems, and resolving conflicts effectively. Strategy: Teach patient calming strategies, problem solving skills, and conflict resolution skills to better manage daily stressors  Diagnosis:   ICD-10-CM   1. Mood disorder ([place])  F39      Plan: Patient today showing some motivation although stated that the only motivation she "had was to come for her appointment ".  She did after ,some push back, become more motivated in session and worked on issues of not believing she can get better.  Did eventually agree to working harder on some of the issues that she is confronting which have been long-term issues and tend to be surrounded with her not believing in herself and that being a reason not to try new strategies to get better.  Work through this reasoning with  patient and she seemed to respond better.  Anxiety seemed to have lessened some since last session.  Depression may be a little better but still present.  Denies any SI. Encouraged patient in her trying to practice more positive behaviors including: Recognizing and reflecting on her progress more often, for every negative thought create 2 positives, consider  alternate ways of looking at herself and her future that include potential positives, realize how certain things impact her mood very sharply, getting outside daily, realizing that in assuming the negatives this feeds her anxiety and depression, reduce her self negating, practice more positive self-care and self talk daily, be willing to put into action some of the more productive ways of interacting with others per our discussion in sessions, practice viewing herself in a more positive way, find things about herself that she actually likes, believe in herself more in her ability to make changes even in difficult circumstances, practice more positive communication skills with others including active listening and "paying attention to how I say what I say", take small breaks as she is able during the day, being mindful of her own self talk and attitude when it goes in a negative direction and try to interrupt it bringing it towards more of a positive/encouraging direction, practice self calming strategies, look more for what might go right versus wrong, staying in contact with friend who is supportive, remain in the present focusing on what she can control or change, continue to work on past hurts and resentments that arise at difficult times more sharply, and realize the strengths she shows working with goal-directed behaviors to move in a direction that supports improved emotional health.  Goal review and progress/challenges noted with patient.  Next appointment within 2 weeks.  This record has been created using [employer].  Chart creation errors have been sought, but may not always have been located and corrected.  Such creation errors do not reflect on the standard of medical care provided.   [name], LCSW

## 2021-10-19 ENCOUNTER — Other Ambulatory Visit: Payer: Self-pay | Admitting: Physician Assistant

## 2021-10-19 DIAGNOSIS — Z124 Encounter for screening for malignant neoplasm of cervix: Secondary | ICD-10-CM

## 2021-10-19 DIAGNOSIS — E039 Hypothyroidism, unspecified: Secondary | ICD-10-CM

## 2021-10-19 DIAGNOSIS — Z1231 Encounter for screening mammogram for malignant neoplasm of breast: Secondary | ICD-10-CM

## 2021-10-19 DIAGNOSIS — F411 Generalized anxiety disorder: Secondary | ICD-10-CM

## 2021-10-19 DIAGNOSIS — Z113 Encounter for screening for infections with a predominantly sexual mode of transmission: Secondary | ICD-10-CM

## 2021-10-19 DIAGNOSIS — E1165 Type 2 diabetes mellitus with hyperglycemia: Secondary | ICD-10-CM

## 2021-10-19 DIAGNOSIS — Z1211 Encounter for screening for malignant neoplasm of colon: Secondary | ICD-10-CM

## 2021-10-19 DIAGNOSIS — K219 Gastro-esophageal reflux disease without esophagitis: Secondary | ICD-10-CM

## 2021-10-19 DIAGNOSIS — R300 Dysuria: Secondary | ICD-10-CM

## 2021-10-19 DIAGNOSIS — Z0001 Encounter for general adult medical examination with abnormal findings: Secondary | ICD-10-CM

## 2021-10-20 ENCOUNTER — Other Ambulatory Visit: Payer: Self-pay

## 2021-10-20 ENCOUNTER — Ambulatory Visit: Payer: 59 | Admitting: Psychiatry

## 2021-10-20 ENCOUNTER — Encounter: Payer: Self-pay | Admitting: Psychiatry

## 2021-10-20 VITALS — BP 117/74 | HR 80

## 2021-10-20 DIAGNOSIS — F5101 Primary insomnia: Secondary | ICD-10-CM

## 2021-10-20 DIAGNOSIS — F39 Unspecified mood [affective] disorder: Secondary | ICD-10-CM | POA: Diagnosis not present

## 2021-10-20 DIAGNOSIS — F411 Generalized anxiety disorder: Secondary | ICD-10-CM | POA: Diagnosis not present

## 2021-10-20 DIAGNOSIS — R69 Illness, unspecified: Secondary | ICD-10-CM | POA: Diagnosis not present

## 2021-10-20 NOTE — Progress Notes (Signed)
Ashley Stephens [id number] Jul 16, 1976 46 y.o.  Subjective:   Patient ID:  Ashley Stephens is a 33 y.o. (DOB 04-26-1976) female.  Chief Complaint:  Chief Complaint  Patient presents with   Depression   Anxiety   Insomnia    HPI Ashley Stephens presents to the office today for follow-up of depression, anxiety, and insomnia. She reports, "I think I am in the mode to go back to work... I've just got to do it." She reports that dealing with STD has been stressful with paper work and delay in getting paid. She reports that she had "a melt down" after delay in pay and thinks that she had a panic attack at that time. She reports that she had paranoia and felt like, "someone is going to get me." She reports that she has some mild paranoia. Continues to feel like people are watching to see if she makes a mistake. She reports that she has some depression. She reports increased anxiety with thoughts about returning to work. No change in irritability. She reports poor sleep and estimates sleeping 4-5 hours a night. Appetite has been fluctuating and had period where she did not want to eat. Low energy and motivation. Concentration has been ok. She reports chronic vague suicidal thoughts. Denies current suicidal intent or plan.   She reports that she has been feeling "itchy" and thinks it could be anxiety. Occ feels sweaty and clammy. She reports continued rumination and catastrophic thinking. She reports that brain MRI being normal "calmed" her and headaches are no longer triggering as much anxiety.   She reports that her medication came back in stock. She reports that she has not seen a change in s/s with increase in [place]. Started increased dose about 5-6 days ago.   She reports that she did reach out to [facility].   Past medication trials: Wellbutrin Effexor Pristiq-effective Lamictal-effective for mood Latuda Xanax- Usually takes at night Propanolol-  Effective Hydroxyzine Rexulti Gabapentin- prescribed for back pain. Dizziness, headaches. Lithium Trileptal  AIMS    Flowsheet Row Office Visit from 10/20/2021 in [place] Office Visit from 09/20/2021 in [place] Visit from 07/11/2021 in [place] Visit from 12/26/2020 in [place] Visit from 08/12/2020 in [place] Total Score 0 0 0 0 0      [place] Office Visit from 12/25/2019 in [place]  Total GAD-7 Score 16      PHQ2-9    [place] Visit from 09/28/2021 in [facility], [facility] Office Visit from 06/29/2021 in [facility], [facility] Office Visit from 12/30/2020 in [facility], [place] from 10/03/2020 in [facility], [facility] Patient Outreach Telephone from 08/22/2020 in [place]  PHQ-2 Total Score 0 0 0 3 2  PHQ-9 Total Score -- -- -- -- 4      Flowsheet Row Admission (Discharged) from 10/28/2020 in [place] No Risk        Review of Systems:  Review of Systems  Musculoskeletal:  Negative for gait problem.  Neurological:  Positive for headaches. Negative for tremors.  Psychiatric/Behavioral:         Please refer to HPI   Medications: I have reviewed the patient's current medications.  Current Outpatient Medications  Medication Sig Dispense Refill   ALPRAZolam (XANAX) 0.25 MG tablet Take 1 tablet (  0.25 mg total) by mouth 2 (two) times daily as needed. for anxiety 45 tablet 5   cholecalciferol (VITAMIN D3) 25 MCG (1000 UNIT) tablet Take 1,000 Units by mouth daily.     Cyanocobalamin (VITAMIN B 12 PO) Take by mouth.     desvenlafaxine (PRISTIQ) 100 MG 24 hr tablet Take 1 tablet (100 mg total) by mouth every morning. 90 tablet 1   desvenlafaxine (PRISTIQ) 50 MG 24 hr  tablet Take 1 tablet (50 mg total) by mouth every evening. 90 tablet 1   Dulaglutide (TRULICITY) 1.5 MG/8.6PY SOPN Inject 1.5 mg into the skin once a week. Inject 1.5 mg into skin once a week 6 mL 1   hydrOXYzine (VISTARIL) 50 MG capsule Take 1 capsule by mouth three times daily as needed 270 capsule 0   lamoTRIgine (LAMICTAL) 150 MG tablet Take 1 tablet (150 mg total) by mouth 2 (two) times daily. 180 tablet 1   levothyroxine (SYNTHROID) 25 MCG tablet TAKE 1 TABLET BY MOUTH ONCE DAILY BEFORE BREAKFAST. 90 tablet 1   Lurasidone HCl (LATUDA) 120 MG TABS Take 1 tablet (120 mg total) by mouth daily with supper. 30 tablet 1   omeprazole (PRILOSEC) 40 MG capsule Take 1 capsule (40 mg total) by mouth daily. (Patient taking differently: Take 40 mg by mouth daily as needed.) 30 capsule 3   Oxcarbazepine (TRILEPTAL) 300 MG tablet Take 1 tablet (300 mg total) by mouth 2 (two) times daily. 180 tablet 0   propranolol (INDERAL) 20 MG tablet Take 1 tablet by mouth three times daily as needed 270 tablet 0   rosuvastatin (CRESTOR) 5 MG tablet Take 1 tablet (5 mg total) by mouth daily. 90 tablet 3   Dulaglutide (TRULICITY) 3 PP/5.0DT SOPN Inject 3 mg as directed once a week. 2 mL 2   spironolactone (ALDACTONE) 100 MG tablet Take 100 mg by mouth daily.     No current facility-administered medications for this visit.    Medication Side Effects: None  Allergies: No Known Allergies  Past Medical History:  Diagnosis Date   Anxiety    Depression    Diabetes mellitus without complication (HCC)    Gastric ulcer    GERD (gastroesophageal reflux disease)    Hypertension    Osteoarthritis    Thyroid disorder     Past Medical History, Surgical history, Social history, and Family history were reviewed and updated as appropriate.   Please see review of systems for further details on the patient's review from today.   Objective:   Physical Exam:  BP 117/74    Pulse 80   Physical Exam Constitutional:       General: She is not in acute distress. Musculoskeletal:        General: No deformity.  Neurological:     Mental Status: She is alert and oriented to person, place, and time.     Coordination: Coordination normal.  Psychiatric:        Attention and Perception: Attention and perception normal. She does not perceive auditory or visual hallucinations.        Mood and Affect: Mood is anxious. Affect is not labile, blunt, angry or inappropriate.        Speech: Speech normal.        Behavior: Behavior normal.        Thought Content: Thought content normal. Thought content is not paranoid or delusional. Thought content does not include homicidal or suicidal ideation. Thought content does not include homicidal or suicidal plan.  Cognition and Memory: Cognition and memory normal.        Judgment: Judgment normal.     Comments: Insight intact Dysphoric mood    Lab Review:     Component Value Date/Time   NA 139 12/30/2020 0941   NA 142 12/03/2011 1059   K 4.6 12/30/2020 0941   K 3.4 (L) 12/03/2011 1059   CL 101 12/30/2020 0941   CL 107 12/03/2011 1059   CO2 21 12/30/2020 0941   CO2 27 12/03/2011 1059   GLUCOSE 88 12/30/2020 0941   GLUCOSE 92 07/12/2020 1526   GLUCOSE 66 12/03/2011 1059   BUN 7 12/30/2020 0941   BUN 4 (L) 12/03/2011 1059   CREATININE 0.84 12/30/2020 0941   CREATININE 0.82 12/03/2011 1059   CALCIUM 9.3 12/30/2020 0941   CALCIUM 8.5 12/03/2011 1059   PROT 6.4 12/30/2020 0941   ALBUMIN 4.3 12/30/2020 0941   AST 17 12/30/2020 0941   ALT 10 12/30/2020 0941   ALKPHOS 79 12/30/2020 0941   BILITOT 0.3 12/30/2020 0941   GFRNONAA >60 07/12/2020 1526   GFRNONAA >60 12/03/2011 1059   GFRAA 78 07/10/2019 0953   GFRAA >60 12/03/2011 1059       Component Value Date/Time   WBC 4.8 12/30/2020 0941   WBC 7.0 07/12/2020 1526   RBC 4.69 12/30/2020 0941   RBC 4.76 07/12/2020 1526   HGB 14.0 12/30/2020 0941   HCT 40.8 12/30/2020 0941   PLT 311 12/30/2020 0941   MCV 87  12/30/2020 0941   MCH 29.9 12/30/2020 0941   MCH 29.4 07/12/2020 1526   MCHC 34.3 12/30/2020 0941   MCHC 33.3 07/12/2020 1526   RDW 12.9 12/30/2020 0941   LYMPHSABS 1.4 12/30/2020 0941   MONOABS 0.6 07/12/2020 1526   EOSABS 0.1 12/30/2020 0941   BASOSABS 0.0 12/30/2020 0941    No results found for: POCLITH, LITHIUM   No results found for: PHENYTOIN, PHENOBARB, VALPROATE, CBMZ   .res Assessment: Plan:    Pt seen for 30 minutes and time spent reviewing treatment plan and readiness to return to work. She reports that she would like to return to work on 10/23/21. She reports that she feels ready to return to work although this causes her some anxiety.  Ok to return to work full-time on 10/23/21 without restrictions.  Discussed not making any changes at this time since she increased Latuda dosage less than a week ago and more time is needed to determine response. Will continue Latuda 120 mg po qd for mood s/s and paranoia. Continue Pristiq 100 mg po q am and 50 mg po q evening for anxiety and depression.  Continue Lamictal 150 mg po BID for mood s/s.  Continue Alprazolam 0.25 mg po BID prn anxiety.  Continue Trileptal 300 mg po BID for mood s/s.  Continue Propranolol 20 mg po TID prn anxiety.  Continue Hydroxyzine 50 mg po TID prn anxiety.  Recommend continuing therapy with [name], LCSW.  Pt to follow-up in 4 weeks or sooner if clinically indicated.  Patient advised to contact office with any questions, adverse effects, or acute worsening in signs and symptoms.    Deziray was seen today for depression, anxiety and insomnia.  Diagnoses and all orders for this visit:  Mood disorder ([place])  Generalized anxiety disorder  Primary insomnia     Please see After Visit Summary for patient specific instructions.  Future Appointments  Date Time Provider [place]  10/24/2021  9:00 AM Wine, Argie Ramming, RN  [facility] None  10/24/2021 10:00 AM [name], LCSW [facility] None   10/30/2021  5:00 PM [name], LCSW [facility] None  11/08/2021  4:00 PM [name], LCSW [facility] None  11/16/2021  8:30 AM [name], PMHNP [facility] None  12/28/2021  8:40 AM McDonough, Si Gaul, PA-C [facility] None    No orders of the defined types were placed in this encounter.   -------------------------------

## 2021-10-23 ENCOUNTER — Telehealth: Payer: Self-pay

## 2021-10-23 NOTE — Telephone Encounter (Signed)
LMOM for pt to return call to let me know if she was able to get trulicity 3 mg yet or if she is still using the 1.5 mg

## 2021-10-24 ENCOUNTER — Ambulatory Visit: Payer: 59 | Admitting: Psychiatry

## 2021-10-24 ENCOUNTER — Other Ambulatory Visit: Payer: Self-pay | Admitting: *Deleted

## 2021-10-24 NOTE — Patient Outreach (Signed)
[place] [facility]) Care Management  10/24/2021  Ashley Stephens December 24, 1975 [id number]  Unsuccessful outreach attempt made to patient. RN Health Coach left HIPAA compliant voicemail message along with her contact information.  Plan: RN Health Coach will call patient within the month of March.  [name] RN, BSN [place] [phone] [email or web address]

## 2021-10-30 ENCOUNTER — Ambulatory Visit (INDEPENDENT_AMBULATORY_CARE_PROVIDER_SITE_OTHER): Payer: 59 | Admitting: Psychiatry

## 2021-10-30 ENCOUNTER — Other Ambulatory Visit: Payer: Self-pay

## 2021-10-30 DIAGNOSIS — F39 Unspecified mood [affective] disorder: Secondary | ICD-10-CM

## 2021-10-30 DIAGNOSIS — R69 Illness, unspecified: Secondary | ICD-10-CM | POA: Diagnosis not present

## 2021-10-30 NOTE — Progress Notes (Signed)
[facility] Counselor/Therapist Progress Note  Patient ID: Ashley Stephens, MRN: [id number],    Date: 10/30/2021  Time Spent: 55 minutes   Treatment Type: Individual Therapy  Reported Symptoms: depression (improved some but not a lot, still having negative thoughts), frustration, "sadness about things in general", anxiety  Mental Status Exam:  Appearance:   Casual     Behavior:  Appropriate, Sharing, and Motivated  Motor:  Normal  Speech/Language:   Clear and Coherent  Affect:  Depressed and anxious  Mood:  anxious and depressed  Thought process:  goal directed  Thought content:    Some overthinking, ruminating  Sensory/Perceptual disturbances:    WNL  Orientation:  oriented to person, place, time/date, situation, day of week, month of year, year, and stated date of Feb. 27, 2023  Attention:  Good  Concentration:  Good and Fair  Memory:  WNL  Fund of knowledge:   Good  Insight:    Good and Fair  Judgment:   Good  Impulse Control:  Good   Risk Assessment: Danger to Self:  No Self-injurious Behavior: No Danger to Others: No Duty to Warn:no Physical Aggression / Violence:No  Access to Firearms a concern: No  Gang Involvement:No   Subjective:   Patient in today reporting depression, negative thoughts, depression, and anxiety. Negative thoughts including "I'm useless, worthless", but denies any  SI. Can't really make changes because it's not going to be any different. Wanted to discuss her negative thoughts more and was very open about them and how this has been her pattern for a long time "and it's just easier for me". Processed her feelings that she can't have better thoughts, better life experiences, and explains why she feels that way, and therefore it's hard for her to believe she can change. Agrees by session end to think more between sessions about our discussion today and consider being willing and open to some positive changes and attitudes that could really  benefit her but would require some efforts and some "letting go" .  Continued encouragement to work on her overthinking and not assuming worst case scenarios, decrease her self negating and work to see some of her positives as discussed in session.  Interventions: Cognitive Behavioral Therapy and Solution-Oriented/Positive Psychology  Treatment Goals: Goals remain on treatment plan as patient works with strategies to achieve her goals.  Progress is noted each session and documented in "Progress" section of Note. Long term goal: Develop the ability to recognize, accept, and cope with feelings of depression and anxiety. Short term goal: Learn and implement personal skills for managing stress, solving daily problems, and resolving conflicts effectively. Strategy: Teach patient calming strategies, problem solving skills, and conflict resolution skills to better manage daily stressors  Diagnosis:   ICD-10-CM   1. Mood disorder ([place])  F39      Plan:  Patient today showing some motivation and did participate well in session, just feeling stuck and having difficulty trusting things could be better for her. Processed this more including how she knows she is holding herself back and and in a more "protective" mode. Homework is to think more about our discussion today and reflect on what would be the first changes she would make if she felt she could really be successful in changing. Plan to discuss this further in next session. Was very open today as to her feelings of being stuck and not able to change as "life has always been difficult for me."  Encouraged patient  in her practice of more positive behaviors including: Recognizing and reflecting on her progress more often, for every negative thought create 2 positives, consider alternative ways of looking at herself and her future that include potential positives, realize how certain things impact her mood very sharply, getting outside daily, realizing that  in assuming the negatives this feeds her anxiety and depression, reduce her self negating, practice more positive self-care and self talk daily, be willing to put into action some of the more productive ways of interacting with others per our discussion in sessions, practice viewing herself in a more positive way, find things about herself that she actually likes, believe in herself more and her ability to make changes even in difficult circumstances, practice more positive communication skills with others including active listening and "paying attention to how I say what I say", take small breaks as she is able during the day, being mindful of her own self talk and attitude when it goes in a negative direction and try to interrupt it bringing it towards more of a positive/encouraging direction, practice self calming strategies, look more for what might go right versus wrong, staying in contact with friend who is supportive, remain in the present focusing on what she can control or change, continue to work on past hurts and resentments that arise at difficult times more sharply, and recognize the strengths she shows when working with goal-directed behaviors to move in a direction that supports improved emotional health.  Goal review and progress/challenges noted with patient.  Next appointment within 2 weeks.  This record has been created using [employer].  Chart creation errors have been sought, but may not always have been located and corrected.  Such creation errors do not reflect on the standard of medical care provided.   [name], LCSW

## 2021-11-08 ENCOUNTER — Ambulatory Visit: Payer: 59 | Admitting: Psychiatry

## 2021-11-16 ENCOUNTER — Ambulatory Visit: Payer: 59 | Admitting: Psychiatry

## 2021-11-28 ENCOUNTER — Other Ambulatory Visit: Payer: Self-pay

## 2021-11-28 DIAGNOSIS — E1165 Type 2 diabetes mellitus with hyperglycemia: Secondary | ICD-10-CM

## 2021-11-28 MED ORDER — TRULICITY 3 MG/0.5ML ~~LOC~~ SOAJ: 3.0000 mg | SUBCUTANEOUS | 1 refills | Status: DC

## 2021-12-01 ENCOUNTER — Other Ambulatory Visit: Payer: Self-pay | Admitting: *Deleted

## 2021-12-01 NOTE — Patient Outreach (Signed)
[facility]) Care Management ? ?12/01/2021 ? ?[place] ?October 28, 1975 ?[id number] ? ?Unsuccessful outreach attempt made to patient. RN Health Coach left HIPAA compliant voicemail message along with her contact information. ? ?Plan: ?RN Health Coach will call patient within the month of April. ? ?[name] RN, BSN ?[facility] Care Management  ?RN Health Coach ?[phone] ?[email or web address] ? ?

## 2021-12-27 ENCOUNTER — Other Ambulatory Visit: Payer: Self-pay | Admitting: *Deleted

## 2021-12-27 NOTE — Patient Outreach (Signed)
[place] [facility]) Care Management ? ?12/27/2021 ? ?[place] ?26-Apr-1976 ?[id number] ? ?Unsuccessful outreach attempt made to patient. RN Health Coach left HIPAA compliant voicemail message along with her contact information. ? ?Plan: ?RN Health Coach will call patient within the month of May. ? ?[name] RN, BSN ?[facility] Care Management  ?RN Health Coach ?[phone] ?[email or web address] ? ? ? ?

## 2021-12-28 ENCOUNTER — Telehealth: Payer: Self-pay | Admitting: Psychiatry

## 2021-12-28 ENCOUNTER — Encounter: Payer: Self-pay | Admitting: Physician Assistant

## 2021-12-28 DIAGNOSIS — F332 Major depressive disorder, recurrent severe without psychotic features: Secondary | ICD-10-CM

## 2021-12-28 MED ORDER — AUVELITY 45-105 MG PO TBCR: EXTENDED_RELEASE_TABLET | ORAL | 0 refills | Status: DC

## 2021-12-28 NOTE — Telephone Encounter (Signed)
Called pt to check on her. She reports that she had car issues on her way to last apt. She reports that her car situation caused increased depression for about 2 weeks and then felt drained afterwards. She reports that she has been having increased depression the last 2 days. She reports that her energy and motivation have been low. She reports that she has had some suicidal thoughts- "don't know how, don't know when." She reports that suicidal thoughts are consistent with baseline. Contracts for safety.  ? ?She reports that she has been having to take Xanax 0.5 mg to help get to sleep. She reports that she has been dreaming about deceased friends and relatives- "but it's not a nightmare."  ? ?Discussed potential benefits, risks, and side effects of Auvelity. Will start Auvelity 45-105 mg one tablet daily for 3 days, then increase Auvelity to one tablet twice daily for depression.  Will pull samples.  ? ?Patient advised to contact office with any questions, adverse effects, or acute worsening in signs and symptoms. ? ?

## 2022-01-08 ENCOUNTER — Telehealth: Payer: Self-pay | Admitting: Psychiatry

## 2022-01-08 NOTE — Telephone Encounter (Signed)
Received fax from [employer], employer for completion of FMLA form from [employer]. Placed in Traci's box to complete.  ?

## 2022-01-12 ENCOUNTER — Encounter: Payer: Self-pay | Admitting: Psychiatry

## 2022-01-12 ENCOUNTER — Telehealth: Payer: 59 | Admitting: Psychiatry

## 2022-01-12 DIAGNOSIS — F39 Unspecified mood [affective] disorder: Secondary | ICD-10-CM | POA: Diagnosis not present

## 2022-01-12 DIAGNOSIS — F411 Generalized anxiety disorder: Secondary | ICD-10-CM | POA: Diagnosis not present

## 2022-01-12 DIAGNOSIS — F5101 Primary insomnia: Secondary | ICD-10-CM | POA: Diagnosis not present

## 2022-01-12 DIAGNOSIS — R69 Illness, unspecified: Secondary | ICD-10-CM | POA: Diagnosis not present

## 2022-01-12 MED ORDER — HYDROXYZINE PAMOATE 50 MG PO CAPS: ORAL_CAPSULE | ORAL | 0 refills | Status: DC

## 2022-01-12 MED ORDER — ALPRAZOLAM 0.25 MG PO TABS: 0.2500 mg | ORAL_TABLET | Freq: Two times a day (BID) | ORAL | 5 refills | Status: DC | PRN

## 2022-01-12 MED ORDER — PROPRANOLOL HCL 20 MG PO TABS: 20.0000 mg | ORAL_TABLET | Freq: Three times a day (TID) | ORAL | 0 refills | Status: DC | PRN

## 2022-01-12 MED ORDER — LAMOTRIGINE 150 MG PO TABS: ORAL_TABLET | ORAL | 1 refills | Status: DC

## 2022-01-12 MED ORDER — LURASIDONE HCL 80 MG PO TABS: 80.0000 mg | ORAL_TABLET | Freq: Every day | ORAL | 2 refills | Status: DC

## 2022-01-12 MED ORDER — OXCARBAZEPINE 300 MG PO TABS: 300.0000 mg | ORAL_TABLET | Freq: Two times a day (BID) | ORAL | 0 refills | Status: DC

## 2022-01-12 NOTE — Progress Notes (Signed)
Ashley Stephens ?[id number] ?1975-12-19 ?46 y.o. ? ?Virtual Visit via Video Note ? ?I connected with pt @ on 01/12/22 at  8:30 AM EDT by a video enabled telemedicine application and verified that I am speaking with the correct person using two identifiers. ?  ?I discussed the limitations of evaluation and management by telemedicine and the availability of in person appointments. The patient expressed understanding and agreed to proceed. ? ?I discussed the assessment and treatment plan with the patient. The patient was provided an opportunity to ask questions and all were answered. The patient agreed with the plan and demonstrated an understanding of the instructions. ?  ?The patient was advised to call back or seek an in-person evaluation if the symptoms worsen or if the condition fails to improve as anticipated. ? ?I provided 30 minutes of non-face-to-face time during this encounter.  The patient was located at home.  The provider was located at [place]. ? ? ?[name], PMHNP  ? ?Subjective:  ? ?Patient ID:  Ashley Stephens is a 46 y.o. (DOB 07/08/1976) female. ? ?Chief Complaint:  ?Chief Complaint  ?Patient presents with  ? Depression  ? Anxiety  ? Insomnia  ? ? ?Depression ?       Associated symptoms include insomnia and headaches.  Past medical history includes anxiety.   ?Anxiety ?Symptoms include insomnia.  ? ? ?Insomnia ?PMH includes: depression.   ?Ashley Stephens presents for follow-up of depression, anxiety, and insomnia. She reports that she is "not doing well." She reports that she started taking Auvelity and reports that she has not taken it consistently except for the first week after stress of finding another place to live. She reports that she takes Pristiq consistently since her she will have a severe headache if she does not take it. She reports she has been taking other medications inconsistently.  ? ?She reports several stressors. She reports that a friend  was letting her stay with her and "that has come to end." She reports that her work has been stressful. 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 She reports that she had irritability when told she needed to move and broke several of her own belongings. She reports continued mood lability. She reports that her energy is consistently very low. She reports that her motivation is low. Concentration varies. She reports that she has periods of hungry and "most of the time I am not hungry and just making myself eat." She reports that she has periods of eating/craving sugar. She reports continued chronic suicidal thoughts. Denies suicidal plan or intent.  ? ?She reports that she wakes up feeling tired. Estimates sleeping 3-4 hours a night. She reports that sleep has helped her cope in the past. Denies any worsening sleep with Auvelity.  ? ?She reports limited psychosocial supports. She reports that she and her daughter are close.  ? ?Past medication trials: ?Wellbutrin ?Effexor ?Pristiq-effective ?Auvelity ?Lamictal-effective for mood ?Latuda ?Xanax- Usually takes at night ?Propanolol- Effective ?Hydroxyzine ?Rexulti ?Gabapentin- prescribed for back pain. Dizziness, headaches. ?Lithium ?Trileptal ? ?Review of Systems:  ?Review of Systems  ?Musculoskeletal:  Negative for gait problem.  ?Skin:  Positive for rash.  ?     Continued rash on neck.  ?Neurological:  Positive for headaches. Negative for tremors.  ?Psychiatric/Behavioral:  Positive for depression. The patient has insomnia.   ?     Please refer to HPI  ? ?Medications:  I have reviewed the patient's current medications. ? ?Current Outpatient Medications  ?Medication Sig Dispense Refill  ? desvenlafaxine (PRISTIQ) 100 MG 24 hr tablet Take 1 tablet (100 mg total) by mouth every morning. 90 tablet 1  ? desvenlafaxine (PRISTIQ) 50 MG 24 hr tablet Take 1 tablet (50 mg total) by mouth every evening. 90  tablet 1  ? Dulaglutide (TRULICITY) 1.5 YI/9.4WN SOPN Inject 1.5 mg into the skin once a week. Inject 1.5 mg into skin once a week 6 mL 1  ? lurasidone (LATUDA) 80 MG TABS tablet Take 1 tablet (80 mg total) by mouth daily with supper. 30 tablet 2  ? [START ON 01/13/2022] ALPRAZolam (XANAX) 0.25 MG tablet Take 1 tablet (0.25 mg total) by mouth 2 (two) times daily as needed. for anxiety 45 tablet 5  ? cholecalciferol (VITAMIN D3) 25 MCG (1000 UNIT) tablet Take 1,000 Units by mouth daily.    ? Cyanocobalamin (VITAMIN B 12 PO) Take by mouth.    ? Dextromethorphan-buPROPion ER (AUVELITY) 45-105 MG TBCR Take 1 tablet daily for 3 days, then increase to 1 tablet twice daily 30 tablet 0  ? Dulaglutide (TRULICITY) 3 IO/2.7OJ SOPN Inject 3 mg as directed once a week. 6 mL 1  ? hydrOXYzine (VISTARIL) 50 MG capsule Take 1 capsule by mouth three times daily as needed 270 capsule 0  ? lamoTRIgine (LAMICTAL) 150 MG tablet Take 1 tab daily for 2 weeks, then increase to 1 tab BID 180 tablet 1  ? levothyroxine (SYNTHROID) 25 MCG tablet TAKE 1 TABLET BY MOUTH ONCE DAILY BEFORE BREAKFAST. 90 tablet 1  ? omeprazole (PRILOSEC) 40 MG capsule Take 1 capsule (40 mg total) by mouth daily. (Patient taking differently: Take 40 mg by mouth daily as needed.) 30 capsule 3  ? Oxcarbazepine (TRILEPTAL) 300 MG tablet Take 1 tablet (300 mg total) by mouth 2 (two) times daily. 180 tablet 0  ? propranolol (INDERAL) 20 MG tablet Take 1 tablet (20 mg total) by mouth 3 (three) times daily as needed. 270 tablet 0  ? rosuvastatin (CRESTOR) 5 MG tablet Take 1 tablet (5 mg total) by mouth daily. 90 tablet 3  ? spironolactone (ALDACTONE) 100 MG tablet Take 100 mg by mouth daily.    ? ?No current facility-administered medications for this visit.  ? ? ?Medication Side Effects: None ? ?Allergies: No Known Allergies ? ?Past Medical History:  ?Diagnosis Date  ? Anxiety   ? Depression   ? Diabetes mellitus without complication ([place])   ? Gastric ulcer   ? GERD  (gastroesophageal reflux disease)   ? Hypertension   ? Osteoarthritis   ? Thyroid disorder   ? ? ?Family History  ?Problem Relation Age of Onset  ? Breast cancer Maternal Grandmother   ? Diabetes Maternal Grandmother   ? Heart disease Maternal Grandmother   ? Hypertension Maternal Grandmother   ? Heart disease Brother   ? Hypertension Brother   ? Hyperlipidemia Brother   ? Diabetes Maternal Aunt   ? Heart disease Maternal Aunt   ? Hypertension Maternal Aunt   ? Hyperlipidemia Maternal Aunt   ? Diabetes Maternal Aunt   ? Heart disease Maternal Aunt   ? Hypertension Maternal Aunt   ? Hyperlipidemia Maternal Aunt   ? Diabetes Maternal Aunt   ? Heart disease Maternal Aunt   ? Hypertension Maternal Aunt   ? Hyperlipidemia Maternal Aunt   ? ? ?Social History  ? ?Socioeconomic History  ? Marital status: Single  ?  Spouse  name: Not on file  ? Number of children: 1  ? Years of education: Not on file  ? Highest education level: Not on file  ?Occupational History  ? Occupation: Engineer, drilling  ?Tobacco Use  ? Smoking status: Never  ? Smokeless tobacco: Never  ?Vaping Use  ? Vaping Use: Never used  ?Substance and Sexual Activity  ? Alcohol use: No  ?  Alcohol/week: 0.0 standard drinks  ? Drug use: No  ? Sexual activity: Yes  ?  Birth control/protection: Surgical  ?Other Topics Concern  ? Not on file  ?Social History Narrative  ? Not on file  ? ?Social Determinants of Health  ? ?Financial Resource Strain: Not on file  ?Food Insecurity: Not on file  ?Transportation Needs: Not on file  ?Physical Activity: Not on file  ?Stress: Not on file  ?Social Connections: Not on file  ?Intimate Partner Violence: Not on file  ? ? ?Past Medical History, Surgical history, Social history, and Family history were reviewed and updated as appropriate.  ? ?Please see review of systems for further details on the patient's review from today.  ? ?Objective:  ? ?Physical Exam:  ?There were no vitals taken for this visit. ? ?Physical  Exam ?Constitutional:   ?   General: She is not in acute distress. ?Musculoskeletal:     ?   General: No deformity.  ?Neurological:  ?   Mental Status: She is alert and oriented to person, place, and time.  ?   Coordination: Coordi

## 2022-01-15 ENCOUNTER — Ambulatory Visit (INDEPENDENT_AMBULATORY_CARE_PROVIDER_SITE_OTHER): Payer: 59 | Admitting: Psychiatry

## 2022-01-15 DIAGNOSIS — R69 Illness, unspecified: Secondary | ICD-10-CM | POA: Diagnosis not present

## 2022-01-15 DIAGNOSIS — F332 Major depressive disorder, recurrent severe without psychotic features: Secondary | ICD-10-CM

## 2022-01-15 NOTE — Progress Notes (Addendum)
?    [facility] Counselor/Therapist Progress Note ? ?Patient ID: Ashley Stephens, MRN: [id number],   ? ?Date: 01/15/2022 ? ?Time Spent: 55 minutes  ? ?Virtual Visit via Telehealth Note: MyChart Video session ?Connected with patient by a telemedicine/telehealth application, with their informed consent, and verified patient privacy and that I am speaking with the correct person using two identifiers. I discussed the limitations, risks, security and privacy concerns of performing psychotherapy and the availability of in person appointments. I also discussed with the patient that there may be a patient responsible charge related to this service. The patient expressed understanding and agreed to proceed. ?I discussed the treatment planning with the patient. The patient was provided an opportunity to ask questions and all were answered. The patient agreed with the plan and demonstrated an understanding of the instructions. The patient was advised to call  our office if  symptoms worsen or feel they are in a crisis state and need immediate contact. ?  ?Therapist Location: office ?Patient Location: home ?  ? ?Treatment Type: Individual Therapy ? ?Reported Symptoms: depressed, anxiety, frustrated, sad, anger, some SI and commits that she would not harm self but would seek tx at local behavioral health hospital or hospital ED for further evaluation ? ?Mental Status Exam: ? ?Appearance:   Casual     ?Behavior:  Appropriate and Sharing  ?Motor:  Normal  ?Speech/Language:   Clear and Coherent  ?Affect:  Depressed  ?Mood:  anxious, depressed, and sad  ?Thought process:  goal directed  ?Thought content:    overthinking  ?Sensory/Perceptual disturbances:    WNL  ?Orientation:  oriented to person, place, time/date, situation, day of week, month of year, year, and stated date of Jan 15, 2022  ?Attention:  Fair  ?Concentration:  Fair  ?Memory:  WNL  ?Fund of knowledge:   Good  ?Insight:    Fair  ?Judgment:   Fair  ?Impulse  Control:  Good per patient. "I don't have the energy to be impulsive and I don't want to be impulsive."  ? ?Risk Assessment: ?Danger to Self:   had prior thoughts but gives a firm commitment not to harm self and would see help if she felt she was losing control. ?Self-injurious Behavior: No ?Danger to Others: No ?Duty to Warn:no ?Physical Aggression / Violence:No  ?Access to Firearms a concern: No  ?Gang Involvement:No  ? ?Subjective: Patient today reporting depression, anger, frustration, anxiety, some past SI but none currently and states would get help if SI returned. Reports that her symptoms are related mostly to personal, family, work, and finding different place to live. Needing to leave current place she has due to homeowner wanting to not have other people there. Has had to use FMLA a little more often. Has been checking on different housing options and continuing that today along with working the rest of her shift today. Processed her depressive, anxious thoughts in detail.  Decreased self negating.  Thinking her situation through more thoroughly and not showing as much impulsivity as in some past stressful situations. States her supervisors at work seem to be trying to understand and not add on stress, although patient limits info she shares. Housing issue is a concern and she has been checking out a few places and is to check another one this afternoon.  States that she can stay temporarily with her adult daughter if needed but is hopeful of finding her own place.  Is not as negative towards herself this session like she  was previously which is encouraging.  Is also not as quick to inject options/information on potential resources.  Continues to work on her overthinking and in session today has not been as quick to assume worst case scenarios.  Denies any current SI and as noted above again the end of session did commit that if she felt she was losing control she would either call our office or seek help  through the local behavioral health hospital or Hospital ED. ? ? ? ?Interventions: Solution-Oriented/Positive Psychology, Ego-Supportive, and Insight-Oriented ? ?Treatment Goals: ?Goals remain on treatment plan as patient works with strategies to achieve her goals.  Progress is noted each session and documented in "Progress" section of Note. ?Long term goal: ?Develop the ability to recognize, accept, and cope with feelings of depression and anxiety. ?Short term goal: ?Learn and implement personal skills for managing stress, solving daily problems, and resolving conflicts effectively. ?Strategy: ?Teach patient calming strategies, problem solving skills, and conflict resolution skills to better manage daily stressors ? ?Diagnosis: ?  ICD-10-CM   ?1. Severe episode of recurrent major depressive disorder, without psychotic features ([place])  F33.2   ?  ? ?Plan: Patient today reporting low motivation but did well in her participation in session and showed less self negating and more openness to looking at some possible options for herself especially in housing changes that she needs to make.  Does not seem to be feeling quite as stuck as she was last session but also facing some challenging decisions and changes.  Has been more active in her look for housing options including checking on specific ones and is following through on 1 this afternoon.  Does not seem to be as hopeless today despite her circumstances and not as quick to reject possible options and instead, is more persevering in looking at options.  Seemed more open today and less stuck in depression. Encouraged patient in the practice of more positive behaviors including: Reflecting on progress made, consider alternative ways of looking her herself and her future that include potential positives, realize how certain things impact her mood very sharply and ways of coping more effectively, getting outside daily, realizing that in assuming the negatives this feeds  her anxiety and depression, reduce her self negating, practice more positive self-care and self-talk daily, be willing to put into action some of the more productive ways of interacting with others as discussed in sessions, practice viewing herself in more positive ways, find things about herself that she likes, believe in herself more and her ability to make changes even in "how I say what I say", taking small breaks as she is able during the day, being mindful of her own self talk and attitude when it goes in a negative direction and try to interrupt it bringing it towards more of a positive/encouraging direction, practice self calming strategies, look more for what might go right versus wrong, staying in contact with a friend she mentioned who is supportive, remain in the present focusing on what she can control or change, continue to work on past hurts and resentments that arise at difficult times more sharply, and realize the strength she shows when working with goal directed behaviors to move in a direction that supports improved emotional health. ? ?Review and progress/challenges noted with patient. ? ?Next appointment within 2 to 3 weeks based on her scheduling/work. ? ?This record has been created using [employer].  Chart creation errors have been sought, but may not always have been located and  corrected.  Such creation errors do not reflect on the standard of medical care provided. ? ? ?[name], LCSW ? ? ? ? ? ? ? ? ? ? ? ? ? ? ? ? ? ? ?

## 2022-01-19 ENCOUNTER — Other Ambulatory Visit: Payer: Self-pay | Admitting: Physician Assistant

## 2022-01-19 DIAGNOSIS — E039 Hypothyroidism, unspecified: Secondary | ICD-10-CM

## 2022-01-19 DIAGNOSIS — Z1231 Encounter for screening mammogram for malignant neoplasm of breast: Secondary | ICD-10-CM

## 2022-01-19 DIAGNOSIS — K219 Gastro-esophageal reflux disease without esophagitis: Secondary | ICD-10-CM

## 2022-01-19 DIAGNOSIS — Z113 Encounter for screening for infections with a predominantly sexual mode of transmission: Secondary | ICD-10-CM

## 2022-01-19 DIAGNOSIS — E1165 Type 2 diabetes mellitus with hyperglycemia: Secondary | ICD-10-CM

## 2022-01-19 DIAGNOSIS — R300 Dysuria: Secondary | ICD-10-CM

## 2022-01-19 DIAGNOSIS — E782 Mixed hyperlipidemia: Secondary | ICD-10-CM

## 2022-01-19 DIAGNOSIS — F411 Generalized anxiety disorder: Secondary | ICD-10-CM

## 2022-01-19 DIAGNOSIS — Z0001 Encounter for general adult medical examination with abnormal findings: Secondary | ICD-10-CM

## 2022-01-19 DIAGNOSIS — Z1211 Encounter for screening for malignant neoplasm of colon: Secondary | ICD-10-CM

## 2022-01-19 DIAGNOSIS — Z124 Encounter for screening for malignant neoplasm of cervix: Secondary | ICD-10-CM

## 2022-01-22 ENCOUNTER — Telehealth: Payer: Self-pay

## 2022-01-22 NOTE — Telephone Encounter (Signed)
Left vm and sent mychart message to confirm 01/25/22 appointment-Ashley Stephens

## 2022-01-23 DIAGNOSIS — Z0289 Encounter for other administrative examinations: Secondary | ICD-10-CM

## 2022-01-23 NOTE — Telephone Encounter (Signed)
Paper work completed and signed by Dr. Clovis Pu. Faxed to [facility]

## 2022-01-25 ENCOUNTER — Other Ambulatory Visit: Payer: Self-pay | Admitting: Physician Assistant

## 2022-01-25 ENCOUNTER — Encounter: Payer: Self-pay | Admitting: Physician Assistant

## 2022-01-25 ENCOUNTER — Ambulatory Visit (INDEPENDENT_AMBULATORY_CARE_PROVIDER_SITE_OTHER): Payer: Self-pay | Admitting: Physician Assistant

## 2022-01-25 VITALS — BP 133/80 | HR 75 | Temp 98.4°F | Resp 16 | Ht 67.0 in | Wt 265.0 lb

## 2022-01-25 DIAGNOSIS — Z0100 Encounter for examination of eyes and vision without abnormal findings: Secondary | ICD-10-CM | POA: Diagnosis not present

## 2022-01-25 DIAGNOSIS — R300 Dysuria: Secondary | ICD-10-CM

## 2022-01-25 DIAGNOSIS — F332 Major depressive disorder, recurrent severe without psychotic features: Secondary | ICD-10-CM

## 2022-01-25 DIAGNOSIS — E782 Mixed hyperlipidemia: Secondary | ICD-10-CM

## 2022-01-25 DIAGNOSIS — R69 Illness, unspecified: Secondary | ICD-10-CM | POA: Diagnosis not present

## 2022-01-25 DIAGNOSIS — R21 Rash and other nonspecific skin eruption: Secondary | ICD-10-CM

## 2022-01-25 DIAGNOSIS — Z0001 Encounter for general adult medical examination with abnormal findings: Secondary | ICD-10-CM

## 2022-01-25 DIAGNOSIS — E1165 Type 2 diabetes mellitus with hyperglycemia: Secondary | ICD-10-CM

## 2022-01-25 DIAGNOSIS — E538 Deficiency of other specified B group vitamins: Secondary | ICD-10-CM

## 2022-01-25 DIAGNOSIS — E039 Hypothyroidism, unspecified: Secondary | ICD-10-CM | POA: Diagnosis not present

## 2022-01-25 DIAGNOSIS — R5383 Other fatigue: Secondary | ICD-10-CM | POA: Diagnosis not present

## 2022-01-25 DIAGNOSIS — F411 Generalized anxiety disorder: Secondary | ICD-10-CM | POA: Diagnosis not present

## 2022-01-25 DIAGNOSIS — E119 Type 2 diabetes mellitus without complications: Secondary | ICD-10-CM

## 2022-01-25 LAB — POCT GLYCOSYLATED HEMOGLOBIN (HGB A1C): Hemoglobin A1C: 6 % — AB (ref 4.0–5.6)

## 2022-01-25 LAB — POCT URINALYSIS DIPSTICK
Blood, UA: NEGATIVE
Glucose, UA: NEGATIVE
Nitrite, UA: NEGATIVE
Protein, UA: POSITIVE — AB
Spec Grav, UA: 1.025 (ref 1.010–1.025)
Urobilinogen, UA: 0.2 E.U./dL
pH, UA: 5 (ref 5.0–8.0)

## 2022-01-25 MED ORDER — NITROFURANTOIN MONOHYD MACRO 100 MG PO CAPS: ORAL_CAPSULE | ORAL | 0 refills | Status: DC

## 2022-01-25 NOTE — Progress Notes (Signed)
[facility] [place], [place] [address]  Internal MEDICINE  Office Visit Note  Patient Name: Ashley Stephens  [id number]  [id number]  Date of Service: 01/30/2022  Chief Complaint  Patient presents with   Annual Exam   Depression   Diabetes   Gastroesophageal Reflux   Hypertension   Urinary Tract Infection    Pressure when urinating, bubbles in urine, more concentrated   Quality Metric Gaps    Foot Exam     HPI Pt is here for routine health maintenance examination -Still needs to have eye exam and will schedule -Weight has been fluctuating with mood -pressure, odor to urine, some burning with urination therefore will go ahead and treat -Due for routine fasting labs -continues to have rash along neck, has been contacted by dermatologist and will schedule appointment -Doing well with increased trulicity, but knows she has been eating more sugary foods recently and knows that she needs to do better with this -still struggles with depression, psychiatry once per month therapy twice per month normally. Denies any SI/HI -has not had mammogram, needs to schedule -Due for repeat pap, Pt had abdominal hysterectomy, but reports she still has her cervix and ovaries--will schedule pap in 1 month -UTD on colonoscopy  Current Medication: Outpatient Encounter Medications as of 01/25/2022  Medication Sig Note   ALPRAZolam (XANAX) 0.25 MG tablet Take 1 tablet (0.25 mg total) by mouth 2 (two) times daily as needed. for anxiety    cholecalciferol (VITAMIN D3) 25 MCG (1000 UNIT) tablet Take 1,000 Units by mouth daily.    Cyanocobalamin (VITAMIN B 12 PO) Take by mouth.    desvenlafaxine (PRISTIQ) 100 MG 24 hr tablet Take 1 tablet (100 mg total) by mouth every morning.    desvenlafaxine (PRISTIQ) 50 MG 24 hr tablet Take 1 tablet (50 mg total) by mouth every evening.    Dextromethorphan-buPROPion ER (AUVELITY) 45-105 MG TBCR Take 1 tablet daily for 3 days, then  increase to 1 tablet twice daily    Dulaglutide (TRULICITY) 1.5 NA/3.5TD SOPN Inject 1.5 mg into the skin once a week. Inject 1.5 mg into skin once a week    Dulaglutide (TRULICITY) 3 DU/2.0UR SOPN Inject 3 mg as directed once a week.    hydrOXYzine (VISTARIL) 50 MG capsule Take 1 capsule by mouth three times daily as needed    lamoTRIgine (LAMICTAL) 150 MG tablet Take 1 tab daily for 2 weeks, then increase to 1 tab BID    levothyroxine (SYNTHROID) 25 MCG tablet TAKE 1 TABLET BY MOUTH ONCE DAILY BEFORE BREAKFAST    lurasidone (LATUDA) 80 MG TABS tablet Take 1 tablet (80 mg total) by mouth daily with supper.    nitrofurantoin, macrocrystal-monohydrate, (MACROBID) 100 MG capsule Take 1 cap twice per day for 10 days.    omeprazole (PRILOSEC) 40 MG capsule Take 1 capsule (40 mg total) by mouth daily. (Patient taking differently: Take 40 mg by mouth daily as needed.) 03/21/2021: Prescribed protonix    Oxcarbazepine (TRILEPTAL) 300 MG tablet Take 1 tablet (300 mg total) by mouth 2 (two) times daily.    propranolol (INDERAL) 20 MG tablet Take 1 tablet (20 mg total) by mouth 3 (three) times daily as needed.    rosuvastatin (CRESTOR) 5 MG tablet Take 1 tablet by mouth once daily    spironolactone (ALDACTONE) 100 MG tablet Take 100 mg by mouth daily.    No facility-administered encounter medications on file as of 01/25/2022.    Surgical History: Past  Surgical History:  Procedure Laterality Date   ABDOMINAL HYSTERECTOMY  2013   BARIATRIC SURGERY     BREAST BIOPSY  1995   COLONOSCOPY WITH PROPOFOL N/A 10/28/2020   Procedure: COLONOSCOPY WITH PROPOFOL;  Surgeon: Jonathon Bellows, MD;  Location: [facility] ENDOSCOPY;  Service: Gastroenterology;  Laterality: N/A;   GASTRIC BYPASS  2012   TUBAL LIGATION  1996   tummy tuck  05/27/2020    Medical History: Past Medical History:  Diagnosis Date   Anxiety    Depression    Diabetes mellitus without complication (HCC)    Gastric ulcer    GERD (gastroesophageal  reflux disease)    Hypertension    Osteoarthritis    Thyroid disorder     Family History: Family History  Problem Relation Age of Onset   Breast cancer Maternal Grandmother    Diabetes Maternal Grandmother    Heart disease Maternal Grandmother    Hypertension Maternal Grandmother    Heart disease Brother    Hypertension Brother    Hyperlipidemia Brother    Diabetes Maternal Aunt    Heart disease Maternal Aunt    Hypertension Maternal Aunt    Hyperlipidemia Maternal Aunt    Diabetes Maternal Aunt    Heart disease Maternal Aunt    Hypertension Maternal Aunt    Hyperlipidemia Maternal Aunt    Diabetes Maternal Aunt    Heart disease Maternal Aunt    Hypertension Maternal Aunt    Hyperlipidemia Maternal Aunt       Review of Systems  Constitutional:  Negative for chills, fatigue and unexpected weight change.  HENT:  Negative for congestion, postnasal drip, rhinorrhea, sneezing and sore throat.   Eyes:  Negative for redness.  Respiratory:  Negative for cough, chest tightness and shortness of breath.   Cardiovascular:  Negative for chest pain and palpitations.  Gastrointestinal:  Negative for abdominal pain, constipation, diarrhea, nausea and vomiting.  Genitourinary:  Positive for dysuria. Negative for frequency and urgency.       Abdominal pressure   Musculoskeletal:  Negative for arthralgias, back pain, joint swelling and neck pain.  Skin:  Positive for rash.  Neurological: Negative.  Negative for tremors and numbness.  Hematological:  Negative for adenopathy. Does not bruise/bleed easily.  Psychiatric/Behavioral:  Positive for behavioral problems (Depression). Negative for sleep disturbance and suicidal ideas. The patient is not nervous/anxious.     Vital Signs: BP 133/80   Pulse 75   Temp 98.4 F (36.9 C)   Resp 16   Ht '5\' 7"'$  (1.702 m)   Wt 265 lb (120.2 kg)   BMI 41.50 kg/m    Physical Exam Vitals and nursing note reviewed.  Constitutional:      General:  She is not in acute distress.    Appearance: She is well-developed. She is not diaphoretic.  HENT:     Head: Normocephalic and atraumatic.     Mouth/Throat:     Pharynx: No oropharyngeal exudate.  Eyes:     Pupils: Pupils are equal, round, and reactive to light.  Neck:     Thyroid: No thyromegaly.     Vascular: No JVD.     Trachea: No tracheal deviation.  Cardiovascular:     Rate and Rhythm: Normal rate and regular rhythm.     Heart sounds: Normal heart sounds. No murmur heard.   No friction rub. No gallop.  Pulmonary:     Effort: Pulmonary effort is normal. No respiratory distress.     Breath sounds: No wheezing or  rales.  Chest:     Chest wall: No tenderness.  Abdominal:     General: Bowel sounds are normal.     Palpations: Abdomen is soft.  Musculoskeletal:        General: Normal range of motion.     Cervical back: Normal range of motion and neck supple.  Lymphadenopathy:     Cervical: No cervical adenopathy.  Skin:    General: Skin is warm and dry.     Findings: Rash present.  Neurological:     Mental Status: She is alert and oriented to person, place, and time.     Cranial Nerves: No cranial nerve deficit.  Psychiatric:        Behavior: Behavior normal.        Thought Content: Thought content normal.        Judgment: Judgment normal.     LABS: Recent Results (from the past 2160 hour(s))  POCT HgB A1C     Status: Abnormal   Collection Time: 01/25/22  8:56 AM  Result Value Ref Range   Hemoglobin A1C 6.0 (A) 4.0 - 5.6 %   HbA1c POC (<> result, manual entry)     HbA1c, POC (prediabetic range)     HbA1c, POC (controlled diabetic range)    POCT Urinalysis Dipstick     Status: Abnormal   Collection Time: 01/25/22  8:57 AM  Result Value Ref Range   Color, UA     Clarity, UA     Glucose, UA Negative Negative   Bilirubin, UA Small    Ketones, UA Trace    Spec Grav, UA 1.025 1.010 - 1.025   Blood, UA Negative    pH, UA 5.0 5.0 - 8.0   Protein, UA Positive (A)  Negative   Urobilinogen, UA 0.2 0.2 or 1.0 E.U./dL   Nitrite, UA Negative    Leukocytes, UA Small (1+) (A) Negative   Appearance     Odor    UA/M w/rflx Culture, Routine     Status: None   Collection Time: 01/25/22 12:02 PM   Specimen: Urine   Urine  Result Value Ref Range   Specific Gravity, UA 1.024 1.005 - 1.030   pH, UA 6.0 5.0 - 7.5   Color, UA Yellow Yellow   Appearance Ur Clear Clear   Leukocytes,UA Negative Negative   Protein,UA Negative Negative/Trace   Glucose, UA Negative Negative   Ketones, UA Negative Negative   RBC, UA Negative Negative   Bilirubin, UA Negative Negative   Urobilinogen, Ur 1.0 0.2 - 1.0 mg/dL   Nitrite, UA Negative Negative   Microscopic Examination Comment     Comment: Microscopic follows if indicated.   Microscopic Examination See below:     Comment: Microscopic was indicated and was performed.   Urinalysis Reflex Comment     Comment: This specimen will not reflex to a Urine Culture.  Microscopic Examination     Status: None   Collection Time: 01/25/22 12:02 PM   Urine  Result Value Ref Range   WBC, UA 0-5 0 - 5 /hpf   RBC None seen 0 - 2 /hpf   Epithelial Cells (non renal) 0-10 0 - 10 /hpf   Casts None seen None seen /lpf   Bacteria, UA Few None seen/Few        Assessment/Plan: 1. Encounter for general adult medical examination with abnormal findings CPE performed, due for mammogram still and eye exam, and pap will be repeated in 1 month, will update routine  fasting labs. Colonoscopy up-to-date  2. Type 2 diabetes mellitus with hyperglycemia, without long-term current use of insulin (HCC) - POCT HgB A1C is 6.0 which is slightly increased from 5.7 at last visit.  Patient will continue current medications and will work on diet and exercise  3. Severe episode of recurrent major depressive disorder, without psychotic features ([place]) Followed by psych  4. Generalized anxiety disorder Followed by psych  5. Acquired hypothyroidism We  will update labs and adjust Synthroid as indicated - TSH + free T4  6. Rash of neck Will move forward with scheduling appointment with dermatology  7. Mixed hyperlipidemia Continue Crestor and will update labs - Lipid Panel With LDL/HDL Ratio  8. Diabetic eye exam [facility]) She will move forward with scheduling eye exam  9. B12 deficiency - B12 and Folate Panel  10. Other fatigue - CBC w/Diff/Platelet - Comprehensive metabolic panel - TSH + free T4 - Lipid Panel With LDL/HDL Ratio - B12 and Folate Panel - Iron, TIBC and Ferritin Panel  11. Dysuria Based on symptoms we will go ahead and start on Macrobid and will adjust based on C/S - UA/M w/rflx Culture, Routine - POCT Urinalysis Dipstick - nitrofurantoin, macrocrystal-monohydrate, (MACROBID) 100 MG capsule; Take 1 cap twice per day for 10 days.  Dispense: 20 capsule; Refill: 0   General Counseling: Hanalei verbalizes understanding of the findings of todays visit and agrees with plan of treatment. I have discussed any further diagnostic evaluation that may be needed or ordered today. We also reviewed her medications today. she has been encouraged to call the office with any questions or concerns that should arise related to todays visit.    Counseling:    Orders Placed This Encounter  Procedures   Microscopic Examination   UA/M w/rflx Culture, Routine   CBC w/Diff/Platelet   Comprehensive metabolic panel   TSH + free T4   Lipid Panel With LDL/HDL Ratio   B12 and Folate Panel   Iron, TIBC and Ferritin Panel   POCT HgB A1C   POCT Urinalysis Dipstick    Meds ordered this encounter  Medications   nitrofurantoin, macrocrystal-monohydrate, (MACROBID) 100 MG capsule    Sig: Take 1 cap twice per day for 10 days.    Dispense:  20 capsule    Refill:  0    This patient was seen by Drema Dallas, PA-C in collaboration with Dr. Clayborn Bigness as a part of collaborative care agreement.  Total time spent:35 Minutes  Time  spent includes review of chart, medications, test results, and follow up plan with the patient.     Lavera Guise, MD  Internal Medicine

## 2022-01-26 ENCOUNTER — Telehealth: Payer: Self-pay | Admitting: Psychiatry

## 2022-01-26 LAB — CBC WITH DIFFERENTIAL/PLATELET
Basophils Absolute: 0 10*3/uL (ref 0.0–0.2)
Basos: 1 %
EOS (ABSOLUTE): 0.1 10*3/uL (ref 0.0–0.4)
Eos: 2 %
Hematocrit: 42 % (ref 34.0–46.6)
Hemoglobin: 14.6 g/dL (ref 11.1–15.9)
Immature Grans (Abs): 0 10*3/uL (ref 0.0–0.1)
Immature Granulocytes: 0 %
Lymphocytes Absolute: 1.6 10*3/uL (ref 0.7–3.1)
Lymphs: 36 %
MCH: 30.1 pg (ref 26.6–33.0)
MCHC: 34.8 g/dL (ref 31.5–35.7)
MCV: 87 fL (ref 79–97)
Monocytes Absolute: 0.3 10*3/uL (ref 0.1–0.9)
Monocytes: 7 %
Neutrophils Absolute: 2.4 10*3/uL (ref 1.4–7.0)
Neutrophils: 54 %
Platelets: 294 10*3/uL (ref 150–450)
RBC: 4.85 x10E6/uL (ref 3.77–5.28)
RDW: 12.8 % (ref 11.7–15.4)
WBC: 4.4 10*3/uL (ref 3.4–10.8)

## 2022-01-26 LAB — IRON,TIBC AND FERRITIN PANEL
Ferritin: 46 ng/mL (ref 15–150)
Iron Saturation: 35 % (ref 15–55)
Iron: 102 ug/dL (ref 27–159)
Total Iron Binding Capacity: 292 ug/dL (ref 250–450)
UIBC: 190 ug/dL (ref 131–425)

## 2022-01-26 LAB — UA/M W/RFLX CULTURE, ROUTINE
Bilirubin, UA: NEGATIVE
Glucose, UA: NEGATIVE
Ketones, UA: NEGATIVE
Leukocytes,UA: NEGATIVE
Nitrite, UA: NEGATIVE
Protein,UA: NEGATIVE
RBC, UA: NEGATIVE
Specific Gravity, UA: 1.024 (ref 1.005–1.030)
Urobilinogen, Ur: 1 mg/dL (ref 0.2–1.0)
pH, UA: 6 (ref 5.0–7.5)

## 2022-01-26 LAB — COMPREHENSIVE METABOLIC PANEL
ALT: 14 IU/L (ref 0–32)
AST: 16 IU/L (ref 0–40)
Albumin/Globulin Ratio: 2 (ref 1.2–2.2)
Albumin: 4.3 g/dL (ref 3.8–4.8)
Alkaline Phosphatase: 77 IU/L (ref 44–121)
BUN/Creatinine Ratio: 7 — ABNORMAL LOW (ref 9–23)
BUN: 7 mg/dL (ref 6–24)
Bilirubin Total: 0.2 mg/dL (ref 0.0–1.2)
CO2: 25 mmol/L (ref 20–29)
Calcium: 9.5 mg/dL (ref 8.7–10.2)
Chloride: 104 mmol/L (ref 96–106)
Creatinine, Ser: 0.94 mg/dL (ref 0.57–1.00)
Globulin, Total: 2.2 g/dL (ref 1.5–4.5)
Glucose: 101 mg/dL — ABNORMAL HIGH (ref 70–99)
Potassium: 4.5 mmol/L (ref 3.5–5.2)
Sodium: 142 mmol/L (ref 134–144)
Total Protein: 6.5 g/dL (ref 6.0–8.5)
eGFR: 75 mL/min/{1.73_m2} (ref >59)

## 2022-01-26 LAB — TSH+FREE T4
Free T4: 0.97 ng/dL (ref 0.82–1.77)
TSH: 2.02 u[IU]/mL (ref 0.450–4.500)

## 2022-01-26 LAB — LIPID PANEL WITH LDL/HDL RATIO
Cholesterol, Total: 157 mg/dL (ref 100–199)
HDL: 51 mg/dL (ref >39)
LDL Chol Calc (NIH): 89 mg/dL (ref 0–99)
LDL/HDL Ratio: 1.7 ratio (ref 0.0–3.2)
Triglycerides: 88 mg/dL (ref 0–149)
VLDL Cholesterol Cal: 17 mg/dL (ref 5–40)

## 2022-01-26 LAB — B12 AND FOLATE PANEL
Folate: 18 ng/mL (ref >3.0)
Vitamin B-12: >2000 pg/mL — ABNORMAL HIGH (ref 232–1245)

## 2022-01-26 LAB — MICROSCOPIC EXAMINATION
Casts: NONE SEEN /lpf
RBC, Urine: NONE SEEN /hpf (ref 0–2)

## 2022-01-26 NOTE — Telephone Encounter (Signed)
Received FMLA [employer] Form. Placed in Traci's box 5/26

## 2022-01-30 ENCOUNTER — Telehealth: Payer: Self-pay | Admitting: Psychiatry

## 2022-01-30 NOTE — Telephone Encounter (Signed)
Ashley Stephens called today.  She said that the [facility] paperwork needed to be received on 5/22 and it was received on 5/23.  She says it needs to be resent today to Genevie Cheshire at fax [phone], pls. She didn't know if she means the old one you previously sent or the new one that Charlena Cross put in your mailbox on Friday

## 2022-01-30 NOTE — Telephone Encounter (Signed)
Please review

## 2022-01-30 NOTE — Telephone Encounter (Signed)
Received fax from [employer] re: Ashley Stephens for completion of FMLA form. Placed in Traci's box.

## 2022-01-31 ENCOUNTER — Other Ambulatory Visit: Payer: Self-pay | Admitting: *Deleted

## 2022-01-31 NOTE — Patient Outreach (Signed)
[facility]) Care Management  01/31/2022  Ashley Stephens Jan 14, 1976 [id number]  Unsuccessful outreach attempt made to patient. RN Health Coach left HIPAA compliant voicemail message along with her contact information.  Plan: RN Health Coach will call patient within the month of July.   [name] RN, BSN [place] [phone] [name]

## 2022-02-01 DIAGNOSIS — L700 Acne vulgaris: Secondary | ICD-10-CM | POA: Diagnosis not present

## 2022-02-02 NOTE — Telephone Encounter (Signed)
This was faxed to different name and number per request.

## 2022-02-05 ENCOUNTER — Telehealth: Payer: Self-pay

## 2022-02-05 NOTE — Telephone Encounter (Signed)
-----   Message from Mylinda Latina, PA-C sent at 02/02/2022  2:05 PM EDT ----- Please let her know her labs looked good. Her b12 is elevated so can reduce supplementation some. Also her cholesterol is greatly improved! Her urine also did not show anything when sent to the lab.

## 2022-02-05 NOTE — Telephone Encounter (Signed)
LMOM for pt to return call about lab result

## 2022-02-06 ENCOUNTER — Telehealth: Payer: Self-pay

## 2022-02-06 NOTE — Telephone Encounter (Signed)
Spoke to pt and informed her of lab results.  Advised pt she can reduce taking her B12 supplementation due to her B12 being elevated.  Pt understood.

## 2022-02-06 NOTE — Telephone Encounter (Signed)
-----   Message from Mylinda Latina, PA-C sent at 02/02/2022  2:05 PM EDT ----- Please let her know her labs looked good. Her b12 is elevated so can reduce supplementation some. Also her cholesterol is greatly improved! Her urine also did not show anything when sent to the lab.

## 2022-02-13 ENCOUNTER — Telehealth: Payer: 59 | Admitting: Psychiatry

## 2022-02-13 NOTE — Progress Notes (Signed)
Ashley Stephens [id number] [birth year] 46 y.o.  Pt scheduled for video visit. Pt did not sign in. Provider attempted to call pt and L/M that provider was attempting to reach pt for video visit and would remain signed in if she was trying to sign on. Recommended that she contact office to reschedule if she is unable to connect today. Pt called later to report that she forgot apt and would reschedule.

## 2022-02-15 ENCOUNTER — Other Ambulatory Visit: Payer: Self-pay | Admitting: *Deleted

## 2022-02-15 NOTE — Patient Outreach (Signed)
[facility]) Care Management  02/15/2022  Kamera Dubas 12/31/1975 [id number]  Multiple attempts to establish contact with patient without success. No response from letter mailed to patient. Case is being closed at this time.   Plan: RN Health Coach will close case and will send PCP and patient a case closed letter.   [name] RN, BSN [place] [phone] [email or web address]

## 2022-02-22 ENCOUNTER — Ambulatory Visit: Payer: Self-pay | Admitting: Physician Assistant

## 2022-03-15 ENCOUNTER — Ambulatory Visit: Payer: 59 | Admitting: Psychiatry

## 2022-03-15 ENCOUNTER — Encounter: Payer: Self-pay | Admitting: Psychiatry

## 2022-03-15 DIAGNOSIS — F411 Generalized anxiety disorder: Secondary | ICD-10-CM

## 2022-03-15 DIAGNOSIS — F39 Unspecified mood [affective] disorder: Secondary | ICD-10-CM

## 2022-03-15 DIAGNOSIS — F332 Major depressive disorder, recurrent severe without psychotic features: Secondary | ICD-10-CM | POA: Diagnosis not present

## 2022-03-15 DIAGNOSIS — F5101 Primary insomnia: Secondary | ICD-10-CM

## 2022-03-15 DIAGNOSIS — R69 Illness, unspecified: Secondary | ICD-10-CM | POA: Diagnosis not present

## 2022-03-15 MED ORDER — DESVENLAFAXINE SUCCINATE ER 100 MG PO TB24: 100.0000 mg | ORAL_TABLET | ORAL | 1 refills | Status: DC

## 2022-03-15 MED ORDER — AUVELITY 45-105 MG PO TBCR: 45.0000 mg | EXTENDED_RELEASE_TABLET | Freq: Two times a day (BID) | ORAL | 2 refills | Status: DC

## 2022-03-15 MED ORDER — DESVENLAFAXINE SUCCINATE ER 25 MG PO TB24: ORAL_TABLET | ORAL | 1 refills | Status: DC

## 2022-03-15 MED ORDER — LURASIDONE HCL 80 MG PO TABS: 80.0000 mg | ORAL_TABLET | Freq: Every day | ORAL | 1 refills | Status: DC

## 2022-03-15 MED ORDER — OXCARBAZEPINE 300 MG PO TABS: 300.0000 mg | ORAL_TABLET | Freq: Two times a day (BID) | ORAL | 0 refills | Status: DC

## 2022-03-15 MED ORDER — HYDROXYZINE PAMOATE 50 MG PO CAPS: ORAL_CAPSULE | ORAL | 0 refills | Status: DC

## 2022-03-15 MED ORDER — PROPRANOLOL HCL 20 MG PO TABS: 20.0000 mg | ORAL_TABLET | Freq: Three times a day (TID) | ORAL | 0 refills | Status: DC | PRN

## 2022-03-15 NOTE — Progress Notes (Signed)
Ashley Stephens [id number] Nov 04, 1975 46 y.o.  Subjective:   Patient ID:  Ashley Stephens is a 47 y.o. (DOB 18-May-1976) female.  Chief Complaint:  Chief Complaint  Patient presents with   Anxiety   Depression   Insomnia    HPI Ashley Stephens presents to the office today for follow-up of anxiety, mood disturbance, and insomnia. "More anxiety now... depression comes on a day by day basis." She reports frequent worry and feeling nervous. She reports severe work stress. Reports that she received an e-mail that everyone working remotely will need to return to the office 3-5 days a week. "I'm not set up mentally to back into that office." She reports that she initially felt like this was possibly personal until she was told that this was company wide  She started taking Auvelity the week of July 4th. She reports "it's helping... I'm still depressed and anxious but I can maneuver." Some slight improvement in energy and motivation. Sleep varies in response to stress. She reports she often will "toss and turn" with less middle of the night awakenings compared to the past. Appetite is ok. Concentration "comes and goes" in response to stress and situation. She reports occasional suicidal thoughts without intent or plan.   She is trying to move and has been having difficulty finding a place. She is staying with a friend currently.  Past medication trials: Wellbutrin Effexor Pristiq-effective Auvelity Lamictal-effective for mood Latuda Xanax- Usually takes at night Propanolol- Effective Hydroxyzine Rexulti Gabapentin- prescribed for back pain. Dizziness, headaches. Lithium Trileptal    AIMS    Flowsheet Row Office Visit from 03/15/2022 in [place] Office Visit from 10/20/2021 in [place] Visit from 09/20/2021 in [place] Visit from 07/11/2021 in [place] Visit from  12/26/2020 in [place] Total Score 0 0 0 0 0      Ashley Stephens Office Visit from 12/25/2019 in [place]  Total GAD-7 Score 16      PHQ2-9    [place] Visit from 01/25/2022 in [facility], [facility] Office Visit from 09/28/2021 in [facility], [facility] Office Visit from 06/29/2021 in [facility], [facility] Office Visit from 12/30/2020 in [facility], [facility] Office Visit from 10/03/2020 in [facility], [facility]  PHQ-2 Total Score 0 0 0 0 3      Flowsheet Row Admission (Discharged) from 10/28/2020 in [place] No Risk        Review of Systems:  Review of Systems  Musculoskeletal:  Negative for gait problem.  Neurological:  Positive for headaches. Negative for tremors.  Psychiatric/Behavioral:         Please refer to HPI    Medications: I have reviewed the patient's current medications.  Current Outpatient Medications  Medication Sig Dispense Refill   ALPRAZolam (XANAX) 0.25 MG tablet Take 1 tablet (0.25 mg total) by mouth 2 (two) times daily as needed. for anxiety 45 tablet 5   cholecalciferol (VITAMIN D3) 25 MCG (1000 UNIT) tablet Take 1,000 Units by mouth daily.     Cyanocobalamin (VITAMIN B 12 PO) Take by mouth.     desvenlafaxine (PRISTIQ) 100 MG 24 hr tablet Take 1 tablet (100 mg total) by mouth every morning. 90 tablet 1   desvenlafaxine (PRISTIQ) 25 MG 24 hr tablet Take 1.5 tabs at bedtime 45 tablet 1   Dextromethorphan-buPROPion ER (  AUVELITY) 45-105 MG TBCR Take 45-105 mg by mouth 2 (two) times daily. 60 tablet 2   Dulaglutide (TRULICITY) 1.5 NI/7.7OE SOPN Inject 1.5 mg into the skin once a week. Inject 1.5 mg into skin once a week 6 mL 1   Dulaglutide (TRULICITY) 3 UM/3.5TI SOPN Inject 3 mg as directed once a week. 6 mL 1   hydrOXYzine (VISTARIL) 50 MG capsule Take 1 capsule by mouth three times daily as needed  270 capsule 0   lamoTRIgine (LAMICTAL) 150 MG tablet Take 1 tab daily for 2 weeks, then increase to 1 tab BID 180 tablet 1   levothyroxine (SYNTHROID) 25 MCG tablet TAKE 1 TABLET BY MOUTH ONCE DAILY BEFORE BREAKFAST 30 tablet 0   lurasidone (LATUDA) 80 MG TABS tablet Take 1 tablet (80 mg total) by mouth daily with supper. 90 tablet 1   nitrofurantoin, macrocrystal-monohydrate, (MACROBID) 100 MG capsule Take 1 cap twice per day for 10 days. 20 capsule 0   omeprazole (PRILOSEC) 40 MG capsule Take 1 capsule (40 mg total) by mouth daily. (Patient taking differently: Take 40 mg by mouth daily as needed.) 30 capsule 3   Oxcarbazepine (TRILEPTAL) 300 MG tablet Take 1 tablet (300 mg total) by mouth 2 (two) times daily. 180 tablet 0   propranolol (INDERAL) 20 MG tablet Take 1 tablet (20 mg total) by mouth 3 (three) times daily as needed. 270 tablet 0   rosuvastatin (CRESTOR) 5 MG tablet Take 1 tablet by mouth once daily 30 tablet 0   spironolactone (ALDACTONE) 100 MG tablet Take 100 mg by mouth daily.     No current facility-administered medications for this visit.    Medication Side Effects: Other: Possible increase activation/anxiety  Allergies: No Known Allergies  Past Medical History:  Diagnosis Date   Anxiety    Depression    Diabetes mellitus without complication (HCC)    Gastric ulcer    GERD (gastroesophageal reflux disease)    Hypertension    Osteoarthritis    Thyroid disorder     Past Medical History, Surgical history, Social history, and Family history were reviewed and updated as appropriate.   Please see review of systems for further details on the patient's review from today.   Objective:   Physical Exam:  There were no vitals taken for this visit.  Physical Exam Constitutional:      General: She is not in acute distress. Musculoskeletal:        General: No deformity.  Neurological:     Mental Status: She is alert and oriented to person, place, and time.      Coordination: Coordination normal.  Psychiatric:        Attention and Perception: Attention and perception normal. She does not perceive auditory or visual hallucinations.        Mood and Affect: Mood is anxious and depressed. Affect is not labile, blunt, angry or inappropriate.        Speech: Speech normal.        Behavior: Behavior normal.        Thought Content: Thought content normal. Thought content is not paranoid or delusional. Thought content does not include homicidal or suicidal ideation. Thought content does not include homicidal or suicidal plan.        Cognition and Memory: Cognition and memory normal.        Judgment: Judgment normal.     Comments: Insight intact     Lab Review:     Component Value Date/Time  NA 142 01/25/2022 0947   NA 142 12/03/2011 1059   K 4.5 01/25/2022 0947   K 3.4 (L) 12/03/2011 1059   CL 104 01/25/2022 0947   CL 107 12/03/2011 1059   CO2 25 01/25/2022 0947   CO2 27 12/03/2011 1059   GLUCOSE 101 (H) 01/25/2022 0947   GLUCOSE 92 07/12/2020 1526   GLUCOSE 66 12/03/2011 1059   BUN 7 01/25/2022 0947   BUN 4 (L) 12/03/2011 1059   CREATININE 0.94 01/25/2022 0947   CREATININE 0.82 12/03/2011 1059   CALCIUM 9.5 01/25/2022 0947   CALCIUM 8.5 12/03/2011 1059   PROT 6.5 01/25/2022 0947   ALBUMIN 4.3 01/25/2022 0947   AST 16 01/25/2022 0947   ALT 14 01/25/2022 0947   ALKPHOS 77 01/25/2022 0947   BILITOT 0.2 01/25/2022 0947   GFRNONAA >60 07/12/2020 1526   GFRNONAA >60 12/03/2011 1059   GFRAA 78 07/10/2019 0953   GFRAA >60 12/03/2011 1059       Component Value Date/Time   WBC 4.4 01/25/2022 0947   WBC 7.0 07/12/2020 1526   RBC 4.85 01/25/2022 0947   RBC 4.76 07/12/2020 1526   HGB 14.6 01/25/2022 0947   HCT 42.0 01/25/2022 0947   PLT 294 01/25/2022 0947   MCV 87 01/25/2022 0947   MCH 30.1 01/25/2022 0947   MCH 29.4 07/12/2020 1526   MCHC 34.8 01/25/2022 0947   MCHC 33.3 07/12/2020 1526   RDW 12.8 01/25/2022 0947   LYMPHSABS 1.6  01/25/2022 0947   MONOABS 0.6 07/12/2020 1526   EOSABS 0.1 01/25/2022 0947   BASOSABS 0.0 01/25/2022 0947    No results found for: "POCLITH", "LITHIUM"   No results found for: "PHENYTOIN", "PHENOBARB", "VALPROATE", "CBMZ"   .res Assessment: Plan:    Patient seen for 30 minutes and time spent discussing response to recently restarting Auvelity.  Discussed that her noticing some slight improvement in depressive symptoms after 1 week is possible since Auvelity can lead to some improvement in depressive symptoms as soon as 1 to 2 weeks and reviewed response data with patient.  Recommended continuing Auvelity twice daily for depression since she is already experiencing some improvement in depression symptoms and may experience further benefit. Discussed considering slight dose reduction in Pristiq since this may be possibly causing increased activation which could exacerbate anxiety and irritability.  She has had severe discontinuation symptoms in the past with late doses of Pristiq and is concerned about possible discontinuation signs and symptoms.  Discussed risk of discontinuation is less with only slight reductions done over a slower period of time.  Patient agrees to decrease in Pristiq from 100 mg in the morning and 50 mg in the evening to 100 mg in the morning and 25 mg 1-1/2 tabs in the evening. Continue Latuda 80 mg daily with supper for mood symptoms. Continue lamotrigine 150 mg twice daily for mood symptoms. Continue hydroxyzine 50 mg 3 times daily as needed for anxiety. Continue Trileptal 300 mg twice daily for mood symptoms. Will continue Xanax 0.25 mg twice daily as needed for anxiety. Recommend patient continue to be allowed to work remotely full-time due to mood and anxiety symptoms.  Recommend continuing therapy with [name], LCSW.  Patient advised to contact office with any questions, adverse effects, or acute worsening in signs and symptoms. Pt to follow-up with this provider  in 4 weeks or sooner if clinically indicated.     Lenny was seen today for anxiety, depression and insomnia.  Diagnoses and all orders for  this visit:  Mood disorder ([place]) -     desvenlafaxine (PRISTIQ) 100 MG 24 hr tablet; Take 1 tablet (100 mg total) by mouth every morning. -     desvenlafaxine (PRISTIQ) 25 MG 24 hr tablet; Take 1.5 tabs at bedtime -     Oxcarbazepine (TRILEPTAL) 300 MG tablet; Take 1 tablet (300 mg total) by mouth 2 (two) times daily.  Generalized anxiety disorder -     desvenlafaxine (PRISTIQ) 100 MG 24 hr tablet; Take 1 tablet (100 mg total) by mouth every morning. -     desvenlafaxine (PRISTIQ) 25 MG 24 hr tablet; Take 1.5 tabs at bedtime -     Oxcarbazepine (TRILEPTAL) 300 MG tablet; Take 1 tablet (300 mg total) by mouth 2 (two) times daily. -     propranolol (INDERAL) 20 MG tablet; Take 1 tablet (20 mg total) by mouth 3 (three) times daily as needed.  Severe episode of recurrent major depressive disorder, without psychotic features ([place]) -     Dextromethorphan-buPROPion ER (AUVELITY) 45-105 MG TBCR; Take 45-105 mg by mouth 2 (two) times daily.  Primary insomnia -     hydrOXYzine (VISTARIL) 50 MG capsule; Take 1 capsule by mouth three times daily as needed -     Oxcarbazepine (TRILEPTAL) 300 MG tablet; Take 1 tablet (300 mg total) by mouth 2 (two) times daily.  Other orders -     lurasidone (LATUDA) 80 MG TABS tablet; Take 1 tablet (80 mg total) by mouth daily with supper.     Please see After Visit Summary for patient specific instructions.  Future Appointments  Date Time Provider [place]  04/13/2022  8:00 AM [name], LCSW CP[facility] None  04/13/2022  9:00 AM [name], PMHNP CP-CP None  01/31/2023 11:00 AM McDonough, Si Gaul, PA-C [facility] None    No orders of the defined types were placed in this encounter.   -------------------------------

## 2022-03-16 ENCOUNTER — Telehealth: Payer: Self-pay | Admitting: Psychiatry

## 2022-03-16 DIAGNOSIS — Z0289 Encounter for other administrative examinations: Secondary | ICD-10-CM

## 2022-03-16 NOTE — Telephone Encounter (Signed)
I already spoke with pt I think this is an old message

## 2022-03-16 NOTE — Telephone Encounter (Signed)
Pt LVM @ 1:09p.  She wants to know if the paperwork she left for Ashley Stephens has been filled out.  She needs it today.  Next appt 8/11

## 2022-03-19 ENCOUNTER — Telehealth: Payer: Self-pay

## 2022-03-19 NOTE — Telephone Encounter (Signed)
Left Voicemail for patient asking if they would be interested in having an eye exam done in our office tomorrow afternoon 03/20/2022.

## 2022-03-21 ENCOUNTER — Telehealth: Payer: Self-pay

## 2022-03-21 NOTE — Telephone Encounter (Addendum)
Prior Authorization initiated Auvelity 45-105 1 BID #60 tablets [employer] [id number]  Approval received Effective:  03/21/2022 - 03/22/2023

## 2022-03-22 ENCOUNTER — Telehealth: Payer: Self-pay | Admitting: Psychiatry

## 2022-03-22 NOTE — Telephone Encounter (Signed)
Pt LVM @ 11:59a.   She wants to speak to [place] or Shelton Silvas about her accomodation paperwork.  Next appt 8/11

## 2022-03-22 NOTE — Telephone Encounter (Signed)
If you don't have time to talk to her let me know and I'll call her.

## 2022-03-23 NOTE — Telephone Encounter (Signed)
I spoke with pt  

## 2022-03-29 ENCOUNTER — Ambulatory Visit: Payer: Self-pay | Admitting: *Deleted

## 2022-04-06 ENCOUNTER — Ambulatory Visit: Payer: 59 | Admitting: Psychiatry

## 2022-04-13 ENCOUNTER — Encounter: Payer: Self-pay | Admitting: Psychiatry

## 2022-04-13 ENCOUNTER — Ambulatory Visit: Payer: 59 | Admitting: Psychiatry

## 2022-04-13 ENCOUNTER — Ambulatory Visit (INDEPENDENT_AMBULATORY_CARE_PROVIDER_SITE_OTHER): Payer: 59 | Admitting: Psychiatry

## 2022-04-13 DIAGNOSIS — R69 Illness, unspecified: Secondary | ICD-10-CM | POA: Diagnosis not present

## 2022-04-13 DIAGNOSIS — F411 Generalized anxiety disorder: Secondary | ICD-10-CM | POA: Diagnosis not present

## 2022-04-13 DIAGNOSIS — F39 Unspecified mood [affective] disorder: Secondary | ICD-10-CM | POA: Diagnosis not present

## 2022-04-13 DIAGNOSIS — F5101 Primary insomnia: Secondary | ICD-10-CM | POA: Diagnosis not present

## 2022-04-13 NOTE — Progress Notes (Signed)
Ashley Stephens [id number] 12-Dec-1975 46 y.o.  Subjective:   Patient ID:  Ashley Stephens is a 8 y.o. (DOB 13-Jun-1976) female.  Chief Complaint:  Chief Complaint  Patient presents with  . Follow-up    Anxiety, mood disturbance, insomnia    HPI Ashley Stephens presents to the office today for follow-up of anxiety, mood disturbance, and insomnia. She reports that her moos has been a "mixture of up and down."   She had severe headaches with attempting to reduce Pristiq and resumed previous dose. She was taking Pristiq 25 mg one tab instead of 1.5 tabs. She reports that Auvelity continues to be helpful for depression. She reports some lows "but not super lows." She feels that her irritability is better "but I think I am managing it better" and will "step away" when she starts to feel irritated. Sleeping ok when she is not experiencing headaches. Has been trying to go to bed earlier to get more sleep. Energy and motivation are low at times. She has to push herself to do things at times. Concentration has been ok. Denies impulsive or risky behavior.   She reports occasional passive death wishes. Denies suicidal ideation or intent.   She reports that she just learned yesterday that her FMLA/accommodations were approved for her to continue to work virtually from home.   She has been looking for a new place to live and is now on a some waitlists for an apartment.   Past medication trials: Wellbutrin Effexor Pristiq-effective Auvelity Lamictal-effective for mood Latuda Xanax- Usually takes at night Propanolol- Effective Hydroxyzine Rexulti Gabapentin- prescribed for back pain. Dizziness, headaches. Lithium Trileptal  AIMS    Flowsheet Row Office Visit from 03/15/2022 in [place] Office Visit from 10/20/2021 in [place] Visit from 09/20/2021 in [place] Visit from 07/11/2021 in [place] Visit from 12/26/2020 in [place] Total Score 0 0 0 0 0      GAD-7    [place] Office Visit from 12/25/2019 in [place]  Total GAD-7 Score 16      PHQ2-9    [place] Visit from 01/25/2022 in [facility] Office Visit from 09/28/2021 in [facility], [facility] Office Visit from 06/29/2021 in [facility], [facility] Office Visit from 12/30/2020 in [facility], [facility] Office Visit from 10/03/2020 in [facility], [facility]  PHQ-2 Total Score 0 0 0 0 3      Flowsheet Row Admission (Discharged) from 10/28/2020 in [place] No Risk        Review of Systems:  Review of Systems  Eyes:        Difficulty with vision  Musculoskeletal:  Negative for gait problem.  Neurological:  Negative for tremors.  Psychiatric/Behavioral:         Please refer to HPI    Medications: I have reviewed the patient's current medications.  Current Outpatient Medications  Medication Sig Dispense Refill  . ALPRAZolam (XANAX) 0.25 MG tablet Take 1 tablet (0.25 mg total) by mouth 2 (two) times daily as needed. for anxiety 45 tablet 5  . cholecalciferol (VITAMIN D3) 25 MCG (1000 UNIT) tablet Take 1,000 Units by mouth daily.    . Cyanocobalamin (VITAMIN B 12 PO) Take by mouth.    . desvenlafaxine (PRISTIQ) 100 MG 24 hr tablet Take 1 tablet (100 mg total) by mouth every morning. [place]  tablet 1  . desvenlafaxine (PRISTIQ) 25 MG 24 hr tablet Take 1.5 tabs at bedtime 45 tablet 1  . Dextromethorphan-buPROPion ER (AUVELITY) 45-105 MG TBCR Take 45-105 mg by mouth 2 (two) times daily. 60 tablet 2  . Dulaglutide (TRULICITY) 1.5 VX/7.9TJ SOPN Inject 1.5 mg into the skin once a week. Inject 1.5 mg into skin once a week 6 mL 1  . Dulaglutide (TRULICITY) 3 QZ/0.0PQ SOPN Inject 3 mg as directed once a week. 6 mL 1  . hydrOXYzine (VISTARIL) 50 MG  capsule Take 1 capsule by mouth three times daily as needed 270 capsule 0  . lamoTRIgine (LAMICTAL) 150 MG tablet Take 1 tab daily for 2 weeks, then increase to 1 tab BID 180 tablet 1  . levothyroxine (SYNTHROID) 25 MCG tablet TAKE 1 TABLET BY MOUTH ONCE DAILY BEFORE BREAKFAST 30 tablet 0  . lurasidone (LATUDA) 80 MG TABS tablet Take 1 tablet (80 mg total) by mouth daily with supper. 90 tablet 1  . nitrofurantoin, macrocrystal-monohydrate, (MACROBID) 100 MG capsule Take 1 cap twice per day for 10 days. 20 capsule 0  . omeprazole (PRILOSEC) 40 MG capsule Take 1 capsule (40 mg total) by mouth daily. (Patient taking differently: Take 40 mg by mouth daily as needed.) 30 capsule 3  . Oxcarbazepine (TRILEPTAL) 300 MG tablet Take 1 tablet (300 mg total) by mouth 2 (two) times daily. 180 tablet 0  . propranolol (INDERAL) 20 MG tablet Take 1 tablet (20 mg total) by mouth 3 (three) times daily as needed. 270 tablet 0  . rosuvastatin (CRESTOR) 5 MG tablet Take 1 tablet by mouth once daily 30 tablet 0  . spironolactone (ALDACTONE) 100 MG tablet Take 100 mg by mouth daily.     No current facility-administered medications for this visit.    Medication Side Effects: None  Allergies: No Known Allergies  Past Medical History:  Diagnosis Date  . Anxiety   . Depression   . Diabetes mellitus without complication ([place])   . Gastric ulcer   . GERD (gastroesophageal reflux disease)   . Hypertension   . Osteoarthritis   . Thyroid disorder     Past Medical History, Surgical history, Social history, and Family history were reviewed and updated as appropriate.   Please see review of systems for further details on the patient's review from today.   Objective:   Physical Exam:  There were no vitals taken for this visit.  Physical Exam  Lab Review:     Component Value Date/Time   NA 142 01/25/2022 0947   NA 142 12/03/2011 1059   K 4.5 01/25/2022 0947   K 3.4 (L) 12/03/2011 1059   CL 104 01/25/2022  0947   CL 107 12/03/2011 1059   CO2 25 01/25/2022 0947   CO2 27 12/03/2011 1059   GLUCOSE 101 (H) 01/25/2022 0947   GLUCOSE 92 07/12/2020 1526   GLUCOSE 66 12/03/2011 1059   BUN 7 01/25/2022 0947   BUN 4 (L) 12/03/2011 1059   CREATININE 0.94 01/25/2022 0947   CREATININE 0.82 12/03/2011 1059   CALCIUM 9.5 01/25/2022 0947   CALCIUM 8.5 12/03/2011 1059   PROT 6.5 01/25/2022 0947   ALBUMIN 4.3 01/25/2022 0947   AST 16 01/25/2022 0947   ALT 14 01/25/2022 0947   ALKPHOS 77 01/25/2022 0947   BILITOT 0.2 01/25/2022 0947   GFRNONAA >60 07/12/2020 1526   GFRNONAA >60 12/03/2011 1059   GFRAA 78 07/10/2019 0953   GFRAA >60 12/03/2011 1059  Component Value Date/Time   WBC 4.4 01/25/2022 0947   WBC 7.0 07/12/2020 1526   RBC 4.85 01/25/2022 0947   RBC 4.76 07/12/2020 1526   HGB 14.6 01/25/2022 0947   HCT 42.0 01/25/2022 0947   PLT 294 01/25/2022 0947   MCV 87 01/25/2022 0947   MCH 30.1 01/25/2022 0947   MCH 29.4 07/12/2020 1526   MCHC 34.8 01/25/2022 0947   MCHC 33.3 07/12/2020 1526   RDW 12.8 01/25/2022 0947   LYMPHSABS 1.6 01/25/2022 0947   MONOABS 0.6 07/12/2020 1526   EOSABS 0.1 01/25/2022 0947   BASOSABS 0.0 01/25/2022 0947    No results found for: "POCLITH", "LITHIUM"   No results found for: "PHENYTOIN", "PHENOBARB", "VALPROATE", "CBMZ"   .res Assessment: Plan:    There are no diagnoses linked to this encounter.   Please see After Visit Summary for patient specific instructions.  Future Appointments  Date Time Provider [place]  04/20/2022  8:00 AM [name], LCSW CP-CP None  01/31/2023 11:00 AM McDonough, Si Gaul, PA-C [facility] None    No orders of the defined types were placed in this encounter.   -------------------------------

## 2022-04-20 ENCOUNTER — Ambulatory Visit (INDEPENDENT_AMBULATORY_CARE_PROVIDER_SITE_OTHER): Payer: 59 | Admitting: Psychiatry

## 2022-04-20 DIAGNOSIS — R69 Illness, unspecified: Secondary | ICD-10-CM | POA: Diagnosis not present

## 2022-04-20 DIAGNOSIS — F39 Unspecified mood [affective] disorder: Secondary | ICD-10-CM

## 2022-04-20 NOTE — Progress Notes (Signed)
[facility] Counselor/Therapist Progress Note  Patient ID: Ashley Stephens, MRN: [id number],    Date: 04/20/2022  Time Spent: 55 minutes   Treatment Type: Individual Therapy  Reported Symptoms: some fatigue, anxiety increased, depression (improving)  Mental Status Exam:  Appearance:   Neat     Behavior:  Appropriate and Sharing  Motor:  Normal  Speech/Language:   Clear and Coherent  Affect:  anxious  Mood:  anxious and some depression but "improving"  Thought process:  goal directed  Thought content:    Some overthinking and ruminating  Sensory/Perceptual disturbances:    WNL  Orientation:  oriented to person, place, time/date, situation, day of week, month of year, year, and stated date of April 20, 2022  Attention:  Good  Concentration:  Good  Memory:  WNL  Fund of knowledge:   Good  Insight:    Good and Fair  Judgment:   Good  Impulse Control:  Good   Risk Assessment: Danger to Self:  No Self-injurious Behavior: No Danger to Others: No Duty to Warn:no Physical Aggression / Violence:No  Access to Firearms a concern: No  Gang Involvement:No   Subjective: Patient in today reporting "anxiety increasing and depression decreasing, some tiredness." Nervous about finding a place to live and life in general. Lady she is currently living with continues to let her live in her home while patient looks for another place. Reports family conflict and hurtful communication has really "died down and I stopped communicating with them which helps." Healthy distance with family and feels positive about that. "Hardest part for me now is everyday life and my existence, finances. What helps?? She responds "relaxation and when I don't have to do anything." Worked on some of what she states she would like to accomplish (specific examples and she wanted to focus on things at home).  Patient having difficulty staying away from "what might go wrong versus what might go right".  Still  tried to encourage her and we worked with 2 specific examples of things for her to accomplish, very simple doable tasks that would not take a lot of time and she was able to eventually agree to try that as "it should not be too stressful".  She is showing some improvements in mood at least in the moment during sessions which she reports there is some up-and-downs outside of sessions.  Continuing to work to find a different place to live.  Relieved at work as allowed her to work at home at least another year.  Happy for her daughter who is a Pharmacist, hospital in [place] and was recently awarded for her work.  New supervisor at work that seems to be working out better and not add to patient's stress.  Continuing to encourage patient to decrease her overthinking and her tendency to diminish even small signs of progress and to feel good about the strength she can show to continue taking her medications as prescribed and following up on goal-directed behaviors, and that herself enjoy some moments that can be more pleasurable for her based on some examples she shared.  Denies any thoughts of harm to self or others  Interventions: Cognitive Behavioral Therapy and Ego-Supportive   Treatment Goals: Goals remain on treatment plan as patient works with strategies to achieve her goals.  Progress is noted each session and documented in "Progress" section of Note. Long term goal: Develop the ability to recognize, accept, and cope with feelings of depression and anxiety. Short term  goal: Learn and implement personal skills for managing stress, solving daily problems, and resolving conflicts effectively. Strategy: Teach patient calming strategies, problem solving skills, and conflict resolution skills to better manage daily stressors  Diagnosis:   ICD-10-CM   1. Mood disorder ([place])  F39      Plan: Patient today showing some motivation and active participation in session.  Confirmed some of the positives I am hearing from  patient, even small positives.  Encouraged her to feel good about what she reports in reference to doing well on her job, getting along with new supervisor, and the support she is showing in her adult daughter and her recognitions at work.  Reports that she is still not close with family and that is intentional at this point but does seem to have a little more of a relationship with her daughter.  "Staying away from some other family".  Does not seem to be feeling quite as stuck as in previous sessions however she tends to minimize this.  Is dressed very neatly today and affect is not as hopeless as when I last saw her.  Encouraged her to continue in goal directed behaviors as discussed in session and to notice even small steps of improvement, along with trying to motivate herself to follow through on some strategies or different ways of viewing situations/challenges that can help her but she finds hard to follow through on. Encouraged patient in her practice of positive behaviors including: Realizing how certain things impact her mood negatively and ways of coping more effectively as discussed in session, reflecting more on progress made, consider alternative ways of looking at herself and her future including potential positives, getting outside daily, realizing that in assuming the negatives this feeds her anxiety and depression, reduce her self negating, practice more positive self-care and self-talk daily, be willing to practice some of the more productive ways of interacting with others as discussed in sessions, practice viewing herself in more positive ways, believe in herself and her ability to make changes, taking small breaks as she is able during the day, being mindful of her on self talk and attitude when it goes in a negative direction and try to interrupt it bringing it towards a more positive/encouraging direction, practice self calming strategies, be looking for more of what might go right versus  wrong, staying in contact with a friend that she has said is supportive, stay in the present focusing on what she can change or control, continue to work on past hurts and resentments that can arise at difficult times more sharply, and recognize the strength she shows when working with goal directed behaviors to move in a direction that supports her improved emotional health and overall outlook.  Goal review and progress/challenges noted with patient.  Next appointment within 3 to 4 weeks.  This record has been created using [employer].  Chart creation errors have been sought, but may not always have been located and corrected.  Such creation errors do not reflect on the standard of medical care provided.   [name], LCSW

## 2022-05-03 ENCOUNTER — Other Ambulatory Visit: Payer: Self-pay | Admitting: Physician Assistant

## 2022-05-03 DIAGNOSIS — Z113 Encounter for screening for infections with a predominantly sexual mode of transmission: Secondary | ICD-10-CM

## 2022-05-03 DIAGNOSIS — E039 Hypothyroidism, unspecified: Secondary | ICD-10-CM

## 2022-05-03 DIAGNOSIS — F411 Generalized anxiety disorder: Secondary | ICD-10-CM

## 2022-05-03 DIAGNOSIS — Z1211 Encounter for screening for malignant neoplasm of colon: Secondary | ICD-10-CM

## 2022-05-03 DIAGNOSIS — Z124 Encounter for screening for malignant neoplasm of cervix: Secondary | ICD-10-CM

## 2022-05-03 DIAGNOSIS — R300 Dysuria: Secondary | ICD-10-CM

## 2022-05-03 DIAGNOSIS — Z1231 Encounter for screening mammogram for malignant neoplasm of breast: Secondary | ICD-10-CM

## 2022-05-03 DIAGNOSIS — Z0001 Encounter for general adult medical examination with abnormal findings: Secondary | ICD-10-CM

## 2022-05-03 DIAGNOSIS — E1165 Type 2 diabetes mellitus with hyperglycemia: Secondary | ICD-10-CM

## 2022-05-03 DIAGNOSIS — K219 Gastro-esophageal reflux disease without esophagitis: Secondary | ICD-10-CM

## 2022-05-12 ENCOUNTER — Other Ambulatory Visit: Payer: Self-pay | Admitting: Physician Assistant

## 2022-05-12 DIAGNOSIS — E782 Mixed hyperlipidemia: Secondary | ICD-10-CM

## 2022-05-13 NOTE — Telephone Encounter (Signed)
Pt needs a 3 month f/u appt

## 2022-05-25 ENCOUNTER — Ambulatory Visit: Payer: 59 | Admitting: Psychiatry

## 2022-06-01 ENCOUNTER — Telehealth: Payer: Self-pay | Admitting: Physician Assistant

## 2022-06-01 NOTE — Telephone Encounter (Signed)
Per [facility], referral declined due to patient not responding to calls-Toni

## 2022-06-12 ENCOUNTER — Other Ambulatory Visit: Payer: Self-pay | Admitting: Physician Assistant

## 2022-06-12 DIAGNOSIS — K219 Gastro-esophageal reflux disease without esophagitis: Secondary | ICD-10-CM

## 2022-06-12 DIAGNOSIS — E1165 Type 2 diabetes mellitus with hyperglycemia: Secondary | ICD-10-CM

## 2022-06-12 DIAGNOSIS — Z113 Encounter for screening for infections with a predominantly sexual mode of transmission: Secondary | ICD-10-CM

## 2022-06-12 DIAGNOSIS — Z1231 Encounter for screening mammogram for malignant neoplasm of breast: Secondary | ICD-10-CM

## 2022-06-12 DIAGNOSIS — Z0001 Encounter for general adult medical examination with abnormal findings: Secondary | ICD-10-CM

## 2022-06-12 DIAGNOSIS — F411 Generalized anxiety disorder: Secondary | ICD-10-CM

## 2022-06-12 DIAGNOSIS — Z1211 Encounter for screening for malignant neoplasm of colon: Secondary | ICD-10-CM

## 2022-06-12 DIAGNOSIS — Z124 Encounter for screening for malignant neoplasm of cervix: Secondary | ICD-10-CM

## 2022-06-12 DIAGNOSIS — E039 Hypothyroidism, unspecified: Secondary | ICD-10-CM

## 2022-06-12 DIAGNOSIS — R300 Dysuria: Secondary | ICD-10-CM

## 2022-06-13 NOTE — Telephone Encounter (Signed)
Please call patient for appointment  °

## 2022-06-13 NOTE — Telephone Encounter (Signed)
Pt needs an appointment

## 2022-06-19 ENCOUNTER — Other Ambulatory Visit: Payer: Self-pay

## 2022-06-19 DIAGNOSIS — E782 Mixed hyperlipidemia: Secondary | ICD-10-CM

## 2022-06-19 MED ORDER — ROSUVASTATIN CALCIUM 5 MG PO TABS: 5.0000 mg | ORAL_TABLET | Freq: Every day | ORAL | 0 refills | Status: DC

## 2022-07-05 ENCOUNTER — Ambulatory Visit: Payer: Self-pay | Admitting: Physician Assistant

## 2022-07-14 ENCOUNTER — Telehealth: Payer: Self-pay | Admitting: Psychiatry

## 2022-07-14 ENCOUNTER — Other Ambulatory Visit: Payer: Self-pay | Admitting: Physician Assistant

## 2022-07-14 DIAGNOSIS — F411 Generalized anxiety disorder: Secondary | ICD-10-CM

## 2022-07-14 DIAGNOSIS — E1165 Type 2 diabetes mellitus with hyperglycemia: Secondary | ICD-10-CM

## 2022-07-15 ENCOUNTER — Other Ambulatory Visit: Payer: Self-pay | Admitting: Psychiatry

## 2022-07-15 DIAGNOSIS — F39 Unspecified mood [affective] disorder: Secondary | ICD-10-CM

## 2022-07-15 DIAGNOSIS — F411 Generalized anxiety disorder: Secondary | ICD-10-CM

## 2022-07-16 ENCOUNTER — Telehealth: Payer: Self-pay

## 2022-07-16 ENCOUNTER — Other Ambulatory Visit: Payer: Self-pay

## 2022-07-16 DIAGNOSIS — F39 Unspecified mood [affective] disorder: Secondary | ICD-10-CM

## 2022-07-16 DIAGNOSIS — F411 Generalized anxiety disorder: Secondary | ICD-10-CM

## 2022-07-16 MED ORDER — DESVENLAFAXINE SUCCINATE ER 100 MG PO TB24: 100.0000 mg | ORAL_TABLET | ORAL | 0 refills | Status: DC

## 2022-07-16 NOTE — Telephone Encounter (Signed)
Patient said she has enough to get her to her appt.

## 2022-07-16 NOTE — Telephone Encounter (Signed)
Received refill request for patient. Patient is due for follow-up. Please call to schedule follow-up appointment.

## 2022-07-16 NOTE — Telephone Encounter (Signed)
Ashley Stephens at [employer] LVM at 10:49a.  She came in to pick up her Pristiq over the weekend.  She got home and didn't have it.  The pharmacy said they reviewed camera footage.  It was misplaced and they need a new script sent in for Pristiq '100mg'$ .  They are going to call the insurance company to see if they will cover a replacement script.  I called pt because I saw message that she needed an appt.  She now has one for 11/16

## 2022-07-16 NOTE — Telephone Encounter (Signed)
Rx sent 

## 2022-07-19 ENCOUNTER — Ambulatory Visit: Payer: 59 | Admitting: Psychiatry

## 2022-07-19 ENCOUNTER — Telehealth: Payer: Self-pay | Admitting: Psychiatry

## 2022-07-19 ENCOUNTER — Encounter: Payer: Self-pay | Admitting: Psychiatry

## 2022-07-19 DIAGNOSIS — F332 Major depressive disorder, recurrent severe without psychotic features: Secondary | ICD-10-CM | POA: Diagnosis not present

## 2022-07-19 DIAGNOSIS — F5101 Primary insomnia: Secondary | ICD-10-CM

## 2022-07-19 DIAGNOSIS — F411 Generalized anxiety disorder: Secondary | ICD-10-CM | POA: Diagnosis not present

## 2022-07-19 DIAGNOSIS — R69 Illness, unspecified: Secondary | ICD-10-CM | POA: Diagnosis not present

## 2022-07-19 DIAGNOSIS — F39 Unspecified mood [affective] disorder: Secondary | ICD-10-CM | POA: Diagnosis not present

## 2022-07-19 MED ORDER — DESVENLAFAXINE SUCCINATE ER 50 MG PO TB24: 50.0000 mg | ORAL_TABLET | Freq: Every day | ORAL | 1 refills | Status: DC

## 2022-07-19 MED ORDER — LURASIDONE HCL 80 MG PO TABS: 80.0000 mg | ORAL_TABLET | Freq: Every day | ORAL | 1 refills | Status: DC

## 2022-07-19 MED ORDER — LAMOTRIGINE 150 MG PO TABS: ORAL_TABLET | ORAL | 1 refills | Status: DC

## 2022-07-19 MED ORDER — HYDROXYZINE PAMOATE 50 MG PO CAPS: ORAL_CAPSULE | ORAL | 0 refills | Status: DC

## 2022-07-19 MED ORDER — PROPRANOLOL HCL 20 MG PO TABS: 20.0000 mg | ORAL_TABLET | Freq: Three times a day (TID) | ORAL | 0 refills | Status: DC | PRN

## 2022-07-19 MED ORDER — OXCARBAZEPINE 300 MG PO TABS: 300.0000 mg | ORAL_TABLET | Freq: Two times a day (BID) | ORAL | 0 refills | Status: DC

## 2022-07-19 MED ORDER — DESVENLAFAXINE SUCCINATE ER 100 MG PO TB24: 100.0000 mg | ORAL_TABLET | ORAL | 0 refills | Status: DC

## 2022-07-19 MED ORDER — AUVELITY 45-105 MG PO TBCR: 45.0000 mg | EXTENDED_RELEASE_TABLET | Freq: Two times a day (BID) | ORAL | 1 refills | Status: DC

## 2022-07-19 NOTE — Progress Notes (Signed)
Ashley Stephens [id number] [birth year] 46 y.o.  Subjective:   Patient ID:  Ashley Stephens is a 46 y.o. (DOB 16-Mar-1976) female.  Chief Complaint:  Chief Complaint  Patient presents with   Depression   Anxiety    HPI Ashley Stephens presents to the office today for follow-up of anxiety, mood disturbance, and insomnia.   She reports that she has not been able to reduce Pristiq- "I think it is a mind thing" and is fearful of worsening symptoms if she reduces dose. She reports that she has had persistent depressed mood. She anticipates worsening mood over the holidays. She has had irritability. She reports that anxiety has been "on high alert the last few days." She reports anxiety about the upcoming holidays and how this will affect her. Denies panic attacks. Energy has been low and she has to push herself to get through 8 hours of work. She reports that at times she will sit in a chair without doing anything. Motivation has been low. Anhedonia. She reports early morning awakenings. Denies difficulty staying asleep. Appetite has been normal. Occasionally has to force herself to eat and other times eats to calm herself. Concentration varies. Denies impulsive or risky behavior. She reports intermittent thoughts of suicide- "the normal." Denies suicidal intent.   Found a new place to live and will move the first of the year.   Continues to work remotely.   Alprazolam last filled 06/13/22.  Past medication trials: Wellbutrin Effexor Pristiq-effective Auvelity Lamictal-effective for mood Latuda Rexulti Xanax- Usually takes at night Propanolol- Effective Hydroxyzine Gabapentin- prescribed for back pain. Dizziness, headaches. Lithium Trileptal    AIMS    Flowsheet Row Office Visit from 03/15/2022 in [place] Office Visit from 10/20/2021 in [place] Visit from 09/20/2021 in [place] Visit from  07/11/2021 in [place] Visit from 12/26/2020 in [place] Total Score 0 0 0 0 0      [place] Office Visit from 12/25/2019 in [place]  Total GAD-7 Score 16      PHQ2-9    [place] Visit from 01/25/2022 in [facility], [facility] Office Visit from 09/28/2021 in [facility], [facility] Office Visit from 06/29/2021 in [place] Office Visit from 12/30/2020 in [facility], [facility] Office Visit from 10/03/2020 in [facility], [facility]  PHQ-2 Total Score 0 0 0 0 3      Flowsheet Row Admission (Discharged) from 10/28/2020 in [place] No Risk        Review of Systems:  Review of Systems  Musculoskeletal:  Negative for gait problem.  Neurological:  Negative for tremors.  Psychiatric/Behavioral:         Please refer to HPI    Medications: I have reviewed the patient's current medications.  Current Outpatient Medications  Medication Sig Dispense Refill   ALPRAZolam (XANAX) 0.25 MG tablet Take 1 tablet by mouth twice daily as needed for anxiety 45 tablet 0   cholecalciferol (VITAMIN D3) 25 MCG (1000 UNIT) tablet Take 1,000 Units by mouth daily.     Cyanocobalamin (VITAMIN B 12 PO) Take by mouth.     Dulaglutide (TRULICITY) 1.5 TK/2.4OX SOPN Inject 1.5 mg into the skin once a week. Inject 1.5 mg into skin once a week (Patient taking differently: Inject 3.5 mg into the skin once a week. Inject 1.5 mg  into skin once a week) 6 mL 1   levothyroxine (SYNTHROID) 25 MCG tablet TAKE 1 TABLET BY MOUTH ONCE DAILY BEFORE BREAKFAST 30 tablet 1   omeprazole (PRILOSEC) 40 MG capsule Take 1 capsule (40 mg total) by mouth daily. (Patient taking differently: Take 40 mg by mouth daily as needed.) 30 capsule 3   rosuvastatin (CRESTOR) 5 MG tablet Take 1 tablet (5 mg total) by mouth daily. 30 tablet 0    spironolactone (ALDACTONE) 100 MG tablet Take 100 mg by mouth daily.     TRULICITY 3 CV/8.9FY SOPN INJECT 3 MG  AS NEEDED ONCE A WEEK 12 mL 0   desvenlafaxine (PRISTIQ) 100 MG 24 hr tablet Take 1 tablet (100 mg total) by mouth every morning. 90 tablet 0   desvenlafaxine (PRISTIQ) 50 MG 24 hr tablet Take 1 tablet (50 mg total) by mouth at bedtime. 90 tablet 1   Dextromethorphan-buPROPion ER (AUVELITY) 45-105 MG TBCR Take 45-105 mg by mouth 2 (two) times daily. 180 tablet 1   hydrOXYzine (VISTARIL) 50 MG capsule Take 1 capsule by mouth three times daily as needed 270 capsule 0   lamoTRIgine (LAMICTAL) 150 MG tablet Take 1 tab daily for 2 weeks, then increase to 1 tab BID 180 tablet 1   lurasidone (LATUDA) 80 MG TABS tablet Take 1 tablet (80 mg total) by mouth daily with supper. 90 tablet 1   Oxcarbazepine (TRILEPTAL) 300 MG tablet Take 1 tablet (300 mg total) by mouth 2 (two) times daily. 180 tablet 0   propranolol (INDERAL) 20 MG tablet Take 1 tablet (20 mg total) by mouth 3 (three) times daily as needed. 270 tablet 0   No current facility-administered medications for this visit.    Medication Side Effects: None  Allergies: No Known Allergies  Past Medical History:  Diagnosis Date   Anxiety    Depression    Diabetes mellitus without complication (HCC)    Gastric ulcer    GERD (gastroesophageal reflux disease)    Hypertension    Osteoarthritis    Thyroid disorder     Past Medical History, Surgical history, Social history, and Family history were reviewed and updated as appropriate.   Please see review of systems for further details on the patient's review from today.   Objective:   Physical Exam:  There were no vitals taken for this visit.  Physical Exam Constitutional:      General: She is not in acute distress. Musculoskeletal:        General: No deformity.  Neurological:     Mental Status: She is alert and oriented to person, place, and time.     Cranial Nerves: No  dysarthria.     Coordination: Coordination normal.  Psychiatric:        Attention and Perception: Attention and perception normal. She does not perceive auditory or visual hallucinations.        Mood and Affect: Mood is anxious and depressed. Affect is not labile, blunt, angry or inappropriate.        Speech: Speech normal.        Behavior: Behavior normal. Behavior is cooperative.        Thought Content: Thought content normal. Thought content is not paranoid or delusional. Thought content does not include homicidal or suicidal ideation. Thought content does not include homicidal or suicidal plan.        Cognition and Memory: Cognition and memory normal.        Judgment: Judgment normal.  Comments: Insight intact     Lab Review:     Component Value Date/Time   NA 142 01/25/2022 0947   NA 142 12/03/2011 1059   K 4.5 01/25/2022 0947   K 3.4 (L) 12/03/2011 1059   CL 104 01/25/2022 0947   CL 107 12/03/2011 1059   CO2 25 01/25/2022 0947   CO2 27 12/03/2011 1059   GLUCOSE 101 (H) 01/25/2022 0947   GLUCOSE 92 07/12/2020 1526   GLUCOSE 66 12/03/2011 1059   BUN 7 01/25/2022 0947   BUN 4 (L) 12/03/2011 1059   CREATININE 0.94 01/25/2022 0947   CREATININE 0.82 12/03/2011 1059   CALCIUM 9.5 01/25/2022 0947   CALCIUM 8.5 12/03/2011 1059   PROT 6.5 01/25/2022 0947   ALBUMIN 4.3 01/25/2022 0947   AST 16 01/25/2022 0947   ALT 14 01/25/2022 0947   ALKPHOS 77 01/25/2022 0947   BILITOT 0.2 01/25/2022 0947   GFRNONAA >60 07/12/2020 1526   GFRNONAA >60 12/03/2011 1059   GFRAA 78 07/10/2019 0953   GFRAA >60 12/03/2011 1059       Component Value Date/Time   WBC 4.4 01/25/2022 0947   WBC 7.0 07/12/2020 1526   RBC 4.85 01/25/2022 0947   RBC 4.76 07/12/2020 1526   HGB 14.6 01/25/2022 0947   HCT 42.0 01/25/2022 0947   PLT 294 01/25/2022 0947   MCV 87 01/25/2022 0947   MCH 30.1 01/25/2022 0947   MCH 29.4 07/12/2020 1526   MCHC 34.8 01/25/2022 0947   MCHC 33.3 07/12/2020 1526   RDW  12.8 01/25/2022 0947   LYMPHSABS 1.6 01/25/2022 0947   MONOABS 0.6 07/12/2020 1526   EOSABS 0.1 01/25/2022 0947   BASOSABS 0.0 01/25/2022 0947    No results found for: "POCLITH", "LITHIUM"   No results found for: "PHENYTOIN", "PHENOBARB", "VALPROATE", "CBMZ"   .res Assessment: Plan:    Pt seen for 30 minutes and time spent discussing response to dose reduction attempt in Pristiq. Will increase Pristiq to previous dose of 100 mg in the morning and 50 mg at bedtime since she experienced increased anxiety with dose reduction.  Continue Latuda 80 mg daily with supper for mood symptoms. Continue lamotrigine 150 mg twice daily for mood symptoms. Continue hydroxyzine 50 mg 3 times daily as needed for anxiety. Continue Trileptal 300 mg twice daily for mood symptoms. Will continue Xanax 0.25 mg twice daily as needed for anxiety. Recommend continuing intermittent FMLA without changes.   Pt to follow-up with this provider in 6 weeks or sooner if clinically indicated.  Patient advised to contact office with any questions, adverse effects, or acute worsening in signs and symptoms.    Sharda was seen today for depression and anxiety.  Diagnoses and all orders for this visit:  Generalized anxiety disorder -     desvenlafaxine (PRISTIQ) 50 MG 24 hr tablet; Take 1 tablet (50 mg total) by mouth at bedtime. -     desvenlafaxine (PRISTIQ) 100 MG 24 hr tablet; Take 1 tablet (100 mg total) by mouth every morning. -     Oxcarbazepine (TRILEPTAL) 300 MG tablet; Take 1 tablet (300 mg total) by mouth 2 (two) times daily. -     propranolol (INDERAL) 20 MG tablet; Take 1 tablet (20 mg total) by mouth 3 (three) times daily as needed.  Mood disorder (HCC) -     desvenlafaxine (PRISTIQ) 50 MG 24 hr tablet; Take 1 tablet (50 mg total) by mouth at bedtime. -     desvenlafaxine (PRISTIQ) 100 MG  24 hr tablet; Take 1 tablet (100 mg total) by mouth every morning. -     lamoTRIgine (LAMICTAL) 150 MG tablet; Take 1  tab daily for 2 weeks, then increase to 1 tab BID -     lurasidone (LATUDA) 80 MG TABS tablet; Take 1 tablet (80 mg total) by mouth daily with supper. -     Oxcarbazepine (TRILEPTAL) 300 MG tablet; Take 1 tablet (300 mg total) by mouth 2 (two) times daily.  Severe episode of recurrent major depressive disorder, without psychotic features ([place]) -     Dextromethorphan-buPROPion ER (AUVELITY) 45-105 MG TBCR; Take 45-105 mg by mouth 2 (two) times daily.  Primary insomnia -     hydrOXYzine (VISTARIL) 50 MG capsule; Take 1 capsule by mouth three times daily as needed -     Oxcarbazepine (TRILEPTAL) 300 MG tablet; Take 1 tablet (300 mg total) by mouth 2 (two) times daily.     Please see After Visit Summary for patient specific instructions.  Future Appointments  Date Time Provider [place]  07/24/2022  9:20 AM Jonetta Osgood, NP [facility] None  08/30/2022  8:30 AM [name], PMHNP [facility] None  01/31/2023 11:00 AM McDonough, Si Gaul, PA-C [facility] None    No orders of the defined types were placed in this encounter.   -------------------------------

## 2022-07-19 NOTE — Telephone Encounter (Signed)
Received FMLA paperwork. Given to [facility] 11/16

## 2022-07-23 NOTE — Telephone Encounter (Signed)
Pt checking status on FMLA [employer]. They have to receive by 11/22. Update Pt @ [phone]

## 2022-07-24 ENCOUNTER — Ambulatory Visit: Payer: Self-pay | Admitting: Nurse Practitioner

## 2022-07-24 ENCOUNTER — Encounter: Payer: Self-pay | Admitting: Nurse Practitioner

## 2022-07-24 VITALS — BP 143/86 | HR 97 | Temp 98.3°F | Resp 16 | Ht 67.0 in | Wt 265.8 lb

## 2022-07-24 DIAGNOSIS — E039 Hypothyroidism, unspecified: Secondary | ICD-10-CM

## 2022-07-24 DIAGNOSIS — Z0289 Encounter for other administrative examinations: Secondary | ICD-10-CM

## 2022-07-24 DIAGNOSIS — E1165 Type 2 diabetes mellitus with hyperglycemia: Secondary | ICD-10-CM | POA: Diagnosis not present

## 2022-07-24 DIAGNOSIS — E782 Mixed hyperlipidemia: Secondary | ICD-10-CM | POA: Diagnosis not present

## 2022-07-24 LAB — POCT GLYCOSYLATED HEMOGLOBIN (HGB A1C)
HbA1c, POC (prediabetic range): 6 % (ref 5.7–6.4)
Hemoglobin A1C: 6 % — AB (ref 4.0–5.6)

## 2022-07-24 MED ORDER — ROSUVASTATIN CALCIUM 5 MG PO TABS: 5.0000 mg | ORAL_TABLET | Freq: Every day | ORAL | 0 refills | Status: DC

## 2022-07-24 MED ORDER — SEMAGLUTIDE 7 MG PO TABS: 7.0000 mg | ORAL_TABLET | Freq: Every day | ORAL | 1 refills | Status: DC

## 2022-07-24 MED ORDER — LEVOTHYROXINE SODIUM 25 MCG PO TABS: 25.0000 ug | ORAL_TABLET | Freq: Every day | ORAL | 1 refills | Status: DC

## 2022-07-24 NOTE — Progress Notes (Signed)
[facility] [place], [place] [address]  Internal MEDICINE  Office Visit Note  Patient Name: Ashley Stephens  [id number]  [id number]  Date of Service: 07/24/2022  Chief Complaint  Patient presents with   Follow-up    Med refills    Depression   Diabetes   Gastroesophageal Reflux   Hypertension    HPI Ashley Stephens presents for a follow up visit for medication refills, diabetes and hypertension. A1c 6.0, no change from may. Interested in trying something different instead of trulicity. Has not been on anything else.  Hypertension -- stable, will current medications.  Due for refills   Current Medication: Outpatient Encounter Medications as of 07/24/2022  Medication Sig Note   ALPRAZolam (XANAX) 0.25 MG tablet Take 1 tablet by mouth twice daily as needed for anxiety    cholecalciferol (VITAMIN D3) 25 MCG (1000 UNIT) tablet Take 1,000 Units by mouth daily.    Cyanocobalamin (VITAMIN B 12 PO) Take by mouth.    desvenlafaxine (PRISTIQ) 100 MG 24 hr tablet Take 1 tablet (100 mg total) by mouth every morning.    desvenlafaxine (PRISTIQ) 50 MG 24 hr tablet Take 1 tablet (50 mg total) by mouth at bedtime.    Dextromethorphan-buPROPion ER (AUVELITY) 45-105 MG TBCR Take 45-105 mg by mouth 2 (two) times daily.    hydrOXYzine (VISTARIL) 50 MG capsule Take 1 capsule by mouth three times daily as needed    lamoTRIgine (LAMICTAL) 150 MG tablet Take 1 tab daily for 2 weeks, then increase to 1 tab BID    lurasidone (LATUDA) 80 MG TABS tablet Take 1 tablet (80 mg total) by mouth daily with supper.    omeprazole (PRILOSEC) 40 MG capsule Take 1 capsule (40 mg total) by mouth daily. (Patient taking differently: Take 40 mg by mouth daily as needed.) 03/21/2021: Prescribed protonix    Oxcarbazepine (TRILEPTAL) 300 MG tablet Take 1 tablet (300 mg total) by mouth 2 (two) times daily.    propranolol (INDERAL) 20 MG tablet Take 1 tablet (20 mg total) by mouth 3 (three) times  daily as needed.    Semaglutide 7 MG TABS Take 7 mg by mouth daily before breakfast. On empty stomach, wait 30 minutes before eating, drinking or taking other medications.    spironolactone (ALDACTONE) 100 MG tablet Take 100 mg by mouth daily.    [DISCONTINUED] Dulaglutide (TRULICITY) 1.5 UX/3.2TF SOPN Inject 1.5 mg into the skin once a week. Inject 1.5 mg into skin once a week (Patient taking differently: Inject 3.5 mg into the skin once a week. Inject 1.5 mg into skin once a week)    [DISCONTINUED] levothyroxine (SYNTHROID) 25 MCG tablet TAKE 1 TABLET BY MOUTH ONCE DAILY BEFORE BREAKFAST    [DISCONTINUED] rosuvastatin (CRESTOR) 5 MG tablet Take 1 tablet (5 mg total) by mouth daily.    [DISCONTINUED] TRULICITY 3 TD/3.2KG SOPN INJECT 3 MG  AS NEEDED ONCE A WEEK    levothyroxine (SYNTHROID) 25 MCG tablet Take 1 tablet (25 mcg total) by mouth daily before breakfast.    rosuvastatin (CRESTOR) 5 MG tablet Take 1 tablet (5 mg total) by mouth daily.    No facility-administered encounter medications on file as of 07/24/2022.    Surgical History: Past Surgical History:  Procedure Laterality Date   ABDOMINAL HYSTERECTOMY  2013   BARIATRIC SURGERY     BREAST BIOPSY  1995   COLONOSCOPY WITH PROPOFOL N/A 10/28/2020   Procedure: COLONOSCOPY WITH PROPOFOL;  Surgeon: Jonathon Bellows, MD;  Location: [facility]  ENDOSCOPY;  Service: Gastroenterology;  Laterality: N/A;   GASTRIC BYPASS  2012   TUBAL LIGATION  1996   tummy tuck  05/27/2020    Medical History: Past Medical History:  Diagnosis Date   Anxiety    Depression    Diabetes mellitus without complication ([place])    Gastric ulcer    GERD (gastroesophageal reflux disease)    Hypertension    Osteoarthritis    Thyroid disorder     Family History: Family History  Problem Relation Age of Onset   Breast cancer Maternal Grandmother    Diabetes Maternal Grandmother    Heart disease Maternal Grandmother    Hypertension Maternal Grandmother    Heart disease  Brother    Hypertension Brother    Hyperlipidemia Brother    Diabetes Maternal Aunt    Heart disease Maternal Aunt    Hypertension Maternal Aunt    Hyperlipidemia Maternal Aunt    Diabetes Maternal Aunt    Heart disease Maternal Aunt    Hypertension Maternal Aunt    Hyperlipidemia Maternal Aunt    Diabetes Maternal Aunt    Heart disease Maternal Aunt    Hypertension Maternal Aunt    Hyperlipidemia Maternal Aunt     Social History   Socioeconomic History   Marital status: Single    Spouse name: Not on file   Number of children: 1   Years of education: Not on file   Highest education level: Not on file  Occupational History   Occupation: Engineer, drilling  Tobacco Use   Smoking status: Never   Smokeless tobacco: Never  Vaping Use   Vaping Use: Never used  Substance and Sexual Activity   Alcohol use: No    Alcohol/week: 0.0 standard drinks of alcohol   Drug use: No   Sexual activity: Yes    Birth control/protection: Surgical  Other Topics Concern   Not on file  Social History Narrative   Not on file   Social Determinants of Health   Financial Resource Strain: Not on file  Food Insecurity: No Food Insecurity (08/22/2020)   Hunger Vital Sign    Worried About Running Out of Food in the Last Year: Never true    Ran Out of Food in the Last Year: Never true  Transportation Needs: No Transportation Needs (08/22/2020)   PRAPARE - Hydrologist (Medical): No    Lack of Transportation (Non-Medical): No  Physical Activity: Not on file  Stress: Not on file  Social Connections: Not on file  Intimate Partner Violence: Not on file      Review of Systems  Constitutional:  Negative for chills, fatigue and unexpected weight change.  HENT:  Negative for congestion, rhinorrhea, sneezing and sore throat.   Eyes:  Negative for redness.  Respiratory:  Negative for cough, chest tightness and shortness of breath.   Cardiovascular:  Negative for chest  pain and palpitations.  Gastrointestinal:  Negative for abdominal pain, constipation, diarrhea, nausea and vomiting.  Genitourinary:  Negative for dysuria and frequency.  Musculoskeletal:  Negative for arthralgias, back pain, joint swelling and neck pain.  Skin:  Negative for rash.  Neurological: Negative.  Negative for tremors and numbness.  Hematological:  Negative for adenopathy. Does not bruise/bleed easily.  Psychiatric/Behavioral:  Negative for behavioral problems (Depression), sleep disturbance and suicidal ideas. The patient is not nervous/anxious.     Vital Signs: BP (!) 143/86   Pulse 97   Temp 98.3 F (36.8 C)   Resp  16   Ht '5\' 7"'$  (1.702 m)   Wt 265 lb 12.8 oz (120.6 kg)   SpO2 95%   BMI 41.63 kg/m    Physical Exam Vitals reviewed.  Constitutional:      General: She is not in acute distress.    Appearance: Normal appearance. She is obese. She is not ill-appearing.  HENT:     Head: Normocephalic and atraumatic.  Eyes:     Pupils: Pupils are equal, round, and reactive to light.  Cardiovascular:     Rate and Rhythm: Normal rate and regular rhythm.  Pulmonary:     Effort: Pulmonary effort is normal. No respiratory distress.  Neurological:     Mental Status: She is alert and oriented to person, place, and time.  Psychiatric:        Mood and Affect: Mood normal.        Behavior: Behavior normal.        Assessment/Plan: 1. Type 2 diabetes mellitus with hyperglycemia, without long-term current use of insulin (HCC) Stop trulicity, start sample of rybelsus 3 mg, then pick up first fill of '7mg'$  daily after 1 month. No change in A1c since may.  - POCT glycosylated hemoglobin (Hb A1C) - Semaglutide 7 MG TABS; Take 7 mg by mouth daily before breakfast. On empty stomach, wait 30 minutes before eating, drinking or taking other medications.  Dispense: 90 tablet; Refill: 1  2. Mixed hyperlipidemia Continue rosuvastatin as prescribed - rosuvastatin (CRESTOR) 5 MG tablet;  Take 1 tablet (5 mg total) by mouth daily.  Dispense: 30 tablet; Refill: 0  3. Acquired hypothyroidism Continue levothyroxine as prescribed - levothyroxine (SYNTHROID) 25 MCG tablet; Take 1 tablet (25 mcg total) by mouth daily before breakfast.  Dispense: 30 tablet; Refill: 1   General Counseling: Ashley Stephens verbalizes understanding of the findings of todays visit and agrees with plan of treatment. I have discussed any further diagnostic evaluation that may be needed or ordered today. We also reviewed her medications today. she has been encouraged to call the office with any questions or concerns that should arise related to todays visit.    Orders Placed This Encounter  Procedures   POCT glycosylated hemoglobin (Hb A1C)    Meds ordered this encounter  Medications   rosuvastatin (CRESTOR) 5 MG tablet    Sig: Take 1 tablet (5 mg total) by mouth daily.    Dispense:  30 tablet    Refill:  0    Pt needs a 3 month f/u appt   levothyroxine (SYNTHROID) 25 MCG tablet    Sig: Take 1 tablet (25 mcg total) by mouth daily before breakfast.    Dispense:  30 tablet    Refill:  1   Semaglutide 7 MG TABS    Sig: Take 7 mg by mouth daily before breakfast. On empty stomach, wait 30 minutes before eating, drinking or taking other medications.    Dispense:  90 tablet    Refill:  1    Return in about 8 weeks (around 09/18/2022) for F/U, eval new med with Ander Purpura.   Total time spent:30 Minutes Time spent includes review of chart, medications, test results, and follow up plan with the patient.    Controlled Substance Database was reviewed by me.  This patient was seen by Jonetta Osgood, FNP-C in collaboration with Dr. Clayborn Bigness as a part of collaborative care agreement.   Clearnce Leja R. Valetta Fuller, MSN, FNP-C Internal medicine

## 2022-07-24 NOTE — Telephone Encounter (Signed)
Paper work has been completed and faxed to [employer] and also Genevie Cheshire as in the past. Signed by Dr. Clovis Pu

## 2022-07-25 ENCOUNTER — Telehealth: Payer: Self-pay | Admitting: Nurse Practitioner

## 2022-08-06 NOTE — Telephone Encounter (Signed)
Error

## 2022-08-14 DIAGNOSIS — L731 Pseudofolliculitis barbae: Secondary | ICD-10-CM | POA: Diagnosis not present

## 2022-08-14 DIAGNOSIS — L2089 Other atopic dermatitis: Secondary | ICD-10-CM | POA: Diagnosis not present

## 2022-08-14 DIAGNOSIS — L280 Lichen simplex chronicus: Secondary | ICD-10-CM | POA: Diagnosis not present

## 2022-08-15 ENCOUNTER — Other Ambulatory Visit: Payer: Self-pay | Admitting: Psychiatry

## 2022-08-15 DIAGNOSIS — F411 Generalized anxiety disorder: Secondary | ICD-10-CM

## 2022-08-15 NOTE — Telephone Encounter (Signed)
Filled 11/13 appt 12/28

## 2022-08-17 ENCOUNTER — Other Ambulatory Visit: Payer: Self-pay | Admitting: Nurse Practitioner

## 2022-08-17 DIAGNOSIS — E782 Mixed hyperlipidemia: Secondary | ICD-10-CM

## 2022-08-19 ENCOUNTER — Other Ambulatory Visit: Payer: Self-pay | Admitting: Physician Assistant

## 2022-08-19 DIAGNOSIS — E782 Mixed hyperlipidemia: Secondary | ICD-10-CM

## 2022-08-30 ENCOUNTER — Telehealth: Payer: 59 | Admitting: Psychiatry

## 2022-08-30 ENCOUNTER — Encounter: Payer: Self-pay | Admitting: Psychiatry

## 2022-08-30 DIAGNOSIS — R69 Illness, unspecified: Secondary | ICD-10-CM | POA: Diagnosis not present

## 2022-08-30 DIAGNOSIS — F5101 Primary insomnia: Secondary | ICD-10-CM

## 2022-08-30 DIAGNOSIS — F39 Unspecified mood [affective] disorder: Secondary | ICD-10-CM

## 2022-08-30 DIAGNOSIS — F411 Generalized anxiety disorder: Secondary | ICD-10-CM | POA: Diagnosis not present

## 2022-08-30 MED ORDER — HYDROXYZINE PAMOATE 50 MG PO CAPS: ORAL_CAPSULE | ORAL | 1 refills | Status: DC

## 2022-08-30 MED ORDER — DESVENLAFAXINE SUCCINATE ER 100 MG PO TB24: 100.0000 mg | ORAL_TABLET | ORAL | 1 refills | Status: DC

## 2022-08-30 MED ORDER — OXCARBAZEPINE 300 MG PO TABS: 300.0000 mg | ORAL_TABLET | Freq: Two times a day (BID) | ORAL | 1 refills | Status: DC

## 2022-08-30 MED ORDER — PROPRANOLOL HCL 20 MG PO TABS: 20.0000 mg | ORAL_TABLET | Freq: Three times a day (TID) | ORAL | 1 refills | Status: DC | PRN

## 2022-08-30 MED ORDER — ALPRAZOLAM 0.25 MG PO TABS: 0.2500 mg | ORAL_TABLET | Freq: Two times a day (BID) | ORAL | 5 refills | Status: DC | PRN

## 2022-08-30 MED ORDER — DESVENLAFAXINE SUCCINATE ER 50 MG PO TB24: 50.0000 mg | ORAL_TABLET | Freq: Every day | ORAL | 1 refills | Status: DC

## 2022-08-30 NOTE — Progress Notes (Signed)
Ashley Stephens [id number] 16-Sep-1975 46 y.o.  Virtual Visit via Video Note  I connected with pt @ on 08/30/22 at  8:30 AM EST by a video enabled telemedicine application and verified that I am speaking with the correct person using two identifiers.   I discussed the limitations of evaluation and management by telemedicine and the availability of in person appointments. The patient expressed understanding and agreed to proceed.  I discussed the assessment and treatment plan with the patient. The patient was provided an opportunity to ask questions and all were answered. The patient agreed with the plan and demonstrated an understanding of the instructions.   The patient was advised to call back or seek an in-person evaluation if the symptoms worsen or if the condition fails to improve as anticipated.  I provided 32 minutes of non-face-to-face time during this encounter.  The patient was located at home.  The provider was located at home.   [name], PMHNP   Subjective:   Patient ID:  Ashley Stephens is a 89 y.o. (DOB [birth year]) female.  Chief Complaint:  Chief Complaint  Patient presents with   Follow-up    Mood disturbance, anxiety, and insomnia    HPI Ashley Stephens presents for follow-up of anxiety, mood disturbance, and insomnia.  She reports, "nothing I wasn't able to control without my medicine or going" off by myself." She reports that she tried not to over-analyze.   She reports that her anxiety has been higher than normal. She has had some anticipatory anxiety with the holidays. She is trying to stay focused on work since it is a busy time of year for them and having mandatory overtime. She reports that she has some anxiety about making it through the work day and "staying in work mode and can't let my work mode." She reports that she had a panic attack 2-3 weeks ago while she was working with a client and "it was embarrassing" and that work was  understanding. She reports that she has had increased irritability in the last 1-2 weeks. She reports that her depressed mood has been "normal, it's there. Right now, I'm more on the anxious and irritable side." Denies elevated mood or impulsive, risky behavior. She reports that her sleep varies from sleeping ok to having middle of the night awakenings. She reports that she has been "pushing" herself to get through her work day and after work has very little energy or motivation. She reports that others have commented that she is letting some things go and that she is somewhat withdrawn. Denies any significant change in appetite or concentration. She reports occ suicidal thoughts without intent during a "meltdown" with increased anxiety. She reports some mild paranoia during the "low moments... breakdowns."   She is preparing to move the second week of January and has help for her move.   She has mandatory overtime through the end of January.   She reports that her daughter understands that she has depression, however she  feels her daughter expects her to do things she has always done.   Alprazolam last filled 08/16/22.  Past medication trials: Wellbutrin Effexor Pristiq-effective Auvelity Lamictal-effective for mood Latuda Rexulti Xanax- Usually takes at night Propanolol- Effective Hydroxyzine Gabapentin- prescribed for back pain. Dizziness, headaches. Lithium Trileptal    Review of Systems:  Review of Systems  Musculoskeletal:  Negative for gait problem.  Skin:        Improved rash after starting treatment per dermatologist. She reports dermatologist  is treating her for contact dermatitis.   Neurological:  Negative for tremors.  Psychiatric/Behavioral:         Please refer to HPI    Medications: I have reviewed the patient's current medications.  Current Outpatient Medications  Medication Sig Dispense Refill   Adapalene-Benzoyl Peroxide 0.3-2.5 % GEL SMARTSIG:Topical Every  Evening     cholecalciferol (VITAMIN D3) 25 MCG (1000 UNIT) tablet Take 1,000 Units by mouth daily.     clindamycin (CLINDAGEL) 1 % gel SMARTSIG:Sparingly Topical Every Morning     Cyanocobalamin (VITAMIN B 12 PO) Take by mouth.     Dextromethorphan-buPROPion ER (AUVELITY) 45-105 MG TBCR Take 45-105 mg by mouth 2 (two) times daily. 180 tablet 1   lamoTRIgine (LAMICTAL) 150 MG tablet Take 1 tab daily for 2 weeks, then increase to 1 tab BID 180 tablet 1   levothyroxine (SYNTHROID) 25 MCG tablet Take 1 tablet (25 mcg total) by mouth daily before breakfast. 30 tablet 1   lurasidone (LATUDA) 80 MG TABS tablet Take 1 tablet (80 mg total) by mouth daily with supper. 90 tablet 1   omeprazole (PRILOSEC) 40 MG capsule Take 1 capsule (40 mg total) by mouth daily. (Patient taking differently: Take 40 mg by mouth daily as needed.) 30 capsule 3   rosuvastatin (CRESTOR) 5 MG tablet Take 1 tablet by mouth once daily 30 tablet 5   Semaglutide 7 MG TABS Take 7 mg by mouth daily before breakfast. On empty stomach, wait 30 minutes before eating, drinking or taking other medications. 90 tablet 1   spironolactone (ALDACTONE) 100 MG tablet Take 100 mg by mouth daily.     [START ON 09/12/2022] ALPRAZolam (XANAX) 0.25 MG tablet Take 1 tablet (0.25 mg total) by mouth 2 (two) times daily as needed. for anxiety 45 tablet 5   desvenlafaxine (PRISTIQ) 100 MG 24 hr tablet Take 1 tablet (100 mg total) by mouth every morning. 90 tablet 1   desvenlafaxine (PRISTIQ) 50 MG 24 hr tablet Take 1 tablet (50 mg total) by mouth at bedtime. 90 tablet 1   hydrOXYzine (VISTARIL) 50 MG capsule Take 1 capsule by mouth three times daily as needed 270 capsule 1   Oxcarbazepine (TRILEPTAL) 300 MG tablet Take 1 tablet (300 mg total) by mouth 2 (two) times daily. 180 tablet 1   propranolol (INDERAL) 20 MG tablet Take 1 tablet (20 mg total) by mouth 3 (three) times daily as needed. 270 tablet 1   No current facility-administered medications for this  visit.    Medication Side Effects: None  Allergies: No Known Allergies  Past Medical History:  Diagnosis Date   Anxiety    Depression    Diabetes mellitus without complication (HCC)    Gastric ulcer    GERD (gastroesophageal reflux disease)    Hypertension    Osteoarthritis    Thyroid disorder     Family History  Problem Relation Age of Onset   Breast cancer Maternal Grandmother    Diabetes Maternal Grandmother    Heart disease Maternal Grandmother    Hypertension Maternal Grandmother    Heart disease Brother    Hypertension Brother    Hyperlipidemia Brother    Diabetes Maternal Aunt    Heart disease Maternal Aunt    Hypertension Maternal Aunt    Hyperlipidemia Maternal Aunt    Diabetes Maternal Aunt    Heart disease Maternal Aunt    Hypertension Maternal Aunt    Hyperlipidemia Maternal Aunt    Diabetes Maternal Aunt  Heart disease Maternal Aunt    Hypertension Maternal Aunt    Hyperlipidemia Maternal Aunt     Social History   Socioeconomic History   Marital status: Single    Spouse name: Not on file   Number of children: 1   Years of education: Not on file   Highest education level: Not on file  Occupational History   Occupation: Engineer, drilling  Tobacco Use   Smoking status: Never   Smokeless tobacco: Never  Vaping Use   Vaping Use: Never used  Substance and Sexual Activity   Alcohol use: No    Alcohol/week: 0.0 standard drinks of alcohol   Drug use: No   Sexual activity: Yes    Birth control/protection: Surgical  Other Topics Concern   Not on file  Social History Narrative   Not on file   Social Determinants of Health   Financial Resource Strain: Not on file  Food Insecurity: No Food Insecurity (08/22/2020)   Hunger Vital Sign    Worried About Running Out of Food in the Last Year: Never true    Ran Out of Food in the Last Year: Never true  Transportation Needs: No Transportation Needs (08/22/2020)   PRAPARE - Radiographer, therapeutic (Medical): No    Lack of Transportation (Non-Medical): No  Physical Activity: Not on file  Stress: Not on file  Social Connections: Not on file  Intimate Partner Violence: Not on file    Past Medical History, Surgical history, Social history, and Family history were reviewed and updated as appropriate.   Please see review of systems for further details on the patient's review from today.   Objective:   Physical Exam:  There were no vitals taken for this visit.  Physical Exam Neurological:     Mental Status: She is alert and oriented to person, place, and time.     Cranial Nerves: No dysarthria.  Psychiatric:        Attention and Perception: Attention and perception normal.        Mood and Affect: Mood is anxious.        Speech: Speech normal.        Behavior: Behavior is cooperative.        Thought Content: Thought content normal. Thought content is not paranoid or delusional. Thought content does not include homicidal or suicidal ideation. Thought content does not include homicidal or suicidal plan.        Cognition and Memory: Cognition and memory normal.        Judgment: Judgment normal.     Comments: Insight intact Mood is mildly dysphoric     Lab Review:     Component Value Date/Time   NA 142 01/25/2022 0947   NA 142 12/03/2011 1059   K 4.5 01/25/2022 0947   K 3.4 (L) 12/03/2011 1059   CL 104 01/25/2022 0947   CL 107 12/03/2011 1059   CO2 25 01/25/2022 0947   CO2 27 12/03/2011 1059   GLUCOSE 101 (H) 01/25/2022 0947   GLUCOSE 92 07/12/2020 1526   GLUCOSE 66 12/03/2011 1059   BUN 7 01/25/2022 0947   BUN 4 (L) 12/03/2011 1059   CREATININE 0.94 01/25/2022 0947   CREATININE 0.82 12/03/2011 1059   CALCIUM 9.5 01/25/2022 0947   CALCIUM 8.5 12/03/2011 1059   PROT 6.5 01/25/2022 0947   ALBUMIN 4.3 01/25/2022 0947   AST 16 01/25/2022 0947   ALT 14 01/25/2022 0947   ALKPHOS 77 01/25/2022 0947  BILITOT 0.2 01/25/2022 0947   GFRNONAA >60 07/12/2020  1526   GFRNONAA >60 12/03/2011 1059   GFRAA 78 07/10/2019 0953   GFRAA >60 12/03/2011 1059       Component Value Date/Time   WBC 4.4 01/25/2022 0947   WBC 7.0 07/12/2020 1526   RBC 4.85 01/25/2022 0947   RBC 4.76 07/12/2020 1526   HGB 14.6 01/25/2022 0947   HCT 42.0 01/25/2022 0947   PLT 294 01/25/2022 0947   MCV 87 01/25/2022 0947   MCH 30.1 01/25/2022 0947   MCH 29.4 07/12/2020 1526   MCHC 34.8 01/25/2022 0947   MCHC 33.3 07/12/2020 1526   RDW 12.8 01/25/2022 0947   LYMPHSABS 1.6 01/25/2022 0947   MONOABS 0.6 07/12/2020 1526   EOSABS 0.1 01/25/2022 0947   BASOSABS 0.0 01/25/2022 0947    No results found for: "POCLITH", "LITHIUM"   No results found for: "PHENYTOIN", "PHENOBARB", "VALPROATE", "CBMZ"   .res Assessment: Plan:    Pt seen for 32 minutes and time spent discussing treatment plan. She reports that increase in Pristiq to previous dose has been well tolerated and seems to be helpful for her mood and anxiety symptoms. She reports that she would like to continue current medications without changes at this time since overall her mood and anxiety symptoms are not as severe as she would possibly expect considering current stressors.  Will continue Pristiq 100 mg in the morning and 50 mg at bedtime for anxiety and depression.  Will continue Auvelity 45-105 mg one tablet twice daily for depression. Continue Latuda 80 mg daily with supper for mood symptoms. Continue lamotrigine 150 mg twice daily for mood symptoms. Continue hydroxyzine 50 mg 3 times daily as needed for anxiety. Continue Trileptal 300 mg twice daily for mood symptoms. Will continue Xanax 0.25 mg twice daily as needed for anxiety. Will continue Propranolol prn anxiety.  Recommend continuing therapy with [name], LCSW.  Pt to follow-up in 2 months or sooner if clinically indicated.  Patient advised to contact office with any questions, adverse effects, or acute worsening in signs and  symptoms.   Ashley Stephens was seen today for follow-up.  Diagnoses and all orders for this visit:  Generalized anxiety disorder -     ALPRAZolam (XANAX) 0.25 MG tablet; Take 1 tablet (0.25 mg total) by mouth 2 (two) times daily as needed. for anxiety -     desvenlafaxine (PRISTIQ) 50 MG 24 hr tablet; Take 1 tablet (50 mg total) by mouth at bedtime. -     desvenlafaxine (PRISTIQ) 100 MG 24 hr tablet; Take 1 tablet (100 mg total) by mouth every morning. -     Oxcarbazepine (TRILEPTAL) 300 MG tablet; Take 1 tablet (300 mg total) by mouth 2 (two) times daily. -     propranolol (INDERAL) 20 MG tablet; Take 1 tablet (20 mg total) by mouth 3 (three) times daily as needed.  Mood disorder (HCC) -     desvenlafaxine (PRISTIQ) 50 MG 24 hr tablet; Take 1 tablet (50 mg total) by mouth at bedtime. -     desvenlafaxine (PRISTIQ) 100 MG 24 hr tablet; Take 1 tablet (100 mg total) by mouth every morning. -     Oxcarbazepine (TRILEPTAL) 300 MG tablet; Take 1 tablet (300 mg total) by mouth 2 (two) times daily.  Primary insomnia -     hydrOXYzine (VISTARIL) 50 MG capsule; Take 1 capsule by mouth three times daily as needed -     Oxcarbazepine (TRILEPTAL) 300 MG tablet; Take  1 tablet (300 mg total) by mouth 2 (two) times daily.     Please see After Visit Summary for patient specific instructions.  Future Appointments  Date Time Provider [place]  09/20/2022  9:20 AM McDonough, Si Gaul, PA-C [facility] None  01/31/2023 11:00 AM McDonough, Si Gaul, PA-C [facility] None    No orders of the defined types were placed in this encounter.     -------------------------------

## 2022-09-05 DIAGNOSIS — L700 Acne vulgaris: Secondary | ICD-10-CM | POA: Diagnosis not present

## 2022-09-20 ENCOUNTER — Encounter: Payer: Self-pay | Admitting: Physician Assistant

## 2022-09-20 ENCOUNTER — Ambulatory Visit (INDEPENDENT_AMBULATORY_CARE_PROVIDER_SITE_OTHER): Payer: 59 | Admitting: Physician Assistant

## 2022-09-20 VITALS — BP 132/70 | HR 72 | Temp 98.3°F | Resp 16 | Ht 67.0 in | Wt 267.2 lb

## 2022-09-20 DIAGNOSIS — E538 Deficiency of other specified B group vitamins: Secondary | ICD-10-CM

## 2022-09-20 DIAGNOSIS — E039 Hypothyroidism, unspecified: Secondary | ICD-10-CM | POA: Diagnosis not present

## 2022-09-20 DIAGNOSIS — E1165 Type 2 diabetes mellitus with hyperglycemia: Secondary | ICD-10-CM

## 2022-09-20 DIAGNOSIS — R5383 Other fatigue: Secondary | ICD-10-CM | POA: Diagnosis not present

## 2022-09-20 DIAGNOSIS — E782 Mixed hyperlipidemia: Secondary | ICD-10-CM | POA: Diagnosis not present

## 2022-09-20 MED ORDER — RYBELSUS 14 MG PO TABS: 14.0000 mg | ORAL_TABLET | Freq: Every day | ORAL | 0 refills | Status: DC

## 2022-09-20 MED ORDER — LEVOTHYROXINE SODIUM 25 MCG PO TABS: 25.0000 ug | ORAL_TABLET | Freq: Every day | ORAL | 1 refills | Status: DC

## 2022-09-20 NOTE — Progress Notes (Signed)
[facility] [place], [place] [address]  Internal MEDICINE  Office Visit Note  Patient Name: Ashley Stephens  [id number]  [id number]  Date of Service: 09/26/2022  Chief Complaint  Patient presents with   Follow-up   Depression   Diabetes   Gastroesophageal Reflux   Hypertension   Quality Metric Gaps    Foot Exam and TDAP    HPI Pt is here for routine follow up -Doing well with rybelsus and tolerating well, but not losing weight and would like to increase. If not helping, may want to try alternative such as mounjaro.  -Still seeing behavioral health and is doing well with this -Would like to go ahead and have labs done as she is worried about her kidneys and liver and wants to recheck  Current Medication: Outpatient Encounter Medications as of 09/20/2022  Medication Sig Note   Adapalene-Benzoyl Peroxide 0.3-2.5 % GEL SMARTSIG:Topical Every Evening    ALPRAZolam (XANAX) 0.25 MG tablet Take 1 tablet (0.25 mg total) by mouth 2 (two) times daily as needed. for anxiety    cholecalciferol (VITAMIN D3) 25 MCG (1000 UNIT) tablet Take 1,000 Units by mouth daily.    clindamycin (CLINDAGEL) 1 % gel SMARTSIG:Sparingly Topical Every Morning    Cyanocobalamin (VITAMIN B 12 PO) Take by mouth.    desvenlafaxine (PRISTIQ) 100 MG 24 hr tablet Take 1 tablet (100 mg total) by mouth every morning.    desvenlafaxine (PRISTIQ) 50 MG 24 hr tablet Take 1 tablet (50 mg total) by mouth at bedtime.    Dextromethorphan-buPROPion ER (AUVELITY) 45-105 MG TBCR Take 45-105 mg by mouth 2 (two) times daily.    hydrOXYzine (VISTARIL) 50 MG capsule Take 1 capsule by mouth three times daily as needed    lamoTRIgine (LAMICTAL) 150 MG tablet Take 1 tab daily for 2 weeks, then increase to 1 tab BID    lurasidone (LATUDA) 80 MG TABS tablet Take 1 tablet (80 mg total) by mouth daily with supper.    omeprazole (PRILOSEC) 40 MG capsule Take 1 capsule (40 mg total) by mouth daily. (Patient  taking differently: Take 40 mg by mouth daily as needed.) 03/21/2021: Prescribed protonix    Oxcarbazepine (TRILEPTAL) 300 MG tablet Take 1 tablet (300 mg total) by mouth 2 (two) times daily.    propranolol (INDERAL) 20 MG tablet Take 1 tablet (20 mg total) by mouth 3 (three) times daily as needed.    rosuvastatin (CRESTOR) 5 MG tablet Take 1 tablet by mouth once daily    Semaglutide (RYBELSUS) 14 MG TABS Take 1 tablet (14 mg total) by mouth daily.    spironolactone (ALDACTONE) 100 MG tablet Take 100 mg by mouth daily.    [DISCONTINUED] levothyroxine (SYNTHROID) 25 MCG tablet Take 1 tablet (25 mcg total) by mouth daily before breakfast.    [DISCONTINUED] Semaglutide 7 MG TABS Take 7 mg by mouth daily before breakfast. On empty stomach, wait 30 minutes before eating, drinking or taking other medications.    levothyroxine (SYNTHROID) 25 MCG tablet Take 1 tablet (25 mcg total) by mouth daily before breakfast.    No facility-administered encounter medications on file as of 09/20/2022.    Surgical History: Past Surgical History:  Procedure Laterality Date   ABDOMINAL HYSTERECTOMY  2013   BARIATRIC SURGERY     BREAST BIOPSY  1995   COLONOSCOPY WITH PROPOFOL N/A 10/28/2020   Procedure: COLONOSCOPY WITH PROPOFOL;  Surgeon: Jonathon Bellows, MD;  Location: [facility] ENDOSCOPY;  Service: Gastroenterology;  Laterality: N/A;  GASTRIC BYPASS  2012   TUBAL LIGATION  1996   tummy tuck  05/27/2020    Medical History: Past Medical History:  Diagnosis Date   Anxiety    Depression    Diabetes mellitus without complication ([place])    Gastric ulcer    GERD (gastroesophageal reflux disease)    Hypertension    Osteoarthritis    Thyroid disorder     Family History: Family History  Problem Relation Age of Onset   Breast cancer Maternal Grandmother    Diabetes Maternal Grandmother    Heart disease Maternal Grandmother    Hypertension Maternal Grandmother    Heart disease Brother    Hypertension Brother     Hyperlipidemia Brother    Diabetes Maternal Aunt    Heart disease Maternal Aunt    Hypertension Maternal Aunt    Hyperlipidemia Maternal Aunt    Diabetes Maternal Aunt    Heart disease Maternal Aunt    Hypertension Maternal Aunt    Hyperlipidemia Maternal Aunt    Diabetes Maternal Aunt    Heart disease Maternal Aunt    Hypertension Maternal Aunt    Hyperlipidemia Maternal Aunt     Social History   Socioeconomic History   Marital status: Single    Spouse name: Not on file   Number of children: 1   Years of education: Not on file   Highest education level: Not on file  Occupational History   Occupation: Engineer, drilling  Tobacco Use   Smoking status: Never   Smokeless tobacco: Never  Vaping Use   Vaping Use: Never used  Substance and Sexual Activity   Alcohol use: No    Alcohol/week: 0.0 standard drinks of alcohol   Drug use: No   Sexual activity: Yes    Birth control/protection: Surgical  Other Topics Concern   Not on file  Social History Narrative   Not on file   Social Determinants of Health   Financial Resource Strain: Not on file  Food Insecurity: No Food Insecurity (08/22/2020)   Hunger Vital Sign    Worried About Running Out of Food in the Last Year: Never true    Ran Out of Food in the Last Year: Never true  Transportation Needs: No Transportation Needs (08/22/2020)   PRAPARE - Hydrologist (Medical): No    Lack of Transportation (Non-Medical): No  Physical Activity: Not on file  Stress: Not on file  Social Connections: Not on file  Intimate Partner Violence: Not on file      Review of Systems  Constitutional:  Negative for chills, fatigue and unexpected weight change.  HENT:  Negative for congestion, rhinorrhea, sneezing and sore throat.   Eyes:  Negative for redness.  Respiratory:  Negative for cough, chest tightness and shortness of breath.   Cardiovascular:  Negative for chest pain and palpitations.   Gastrointestinal:  Negative for abdominal pain, constipation, diarrhea, nausea and vomiting.  Genitourinary:  Negative for dysuria and frequency.  Musculoskeletal:  Negative for arthralgias, back pain, joint swelling and neck pain.  Skin:  Negative for rash.  Neurological: Negative.  Negative for tremors and numbness.  Hematological:  Negative for adenopathy. Does not bruise/bleed easily.  Psychiatric/Behavioral:  Negative for behavioral problems (Depression), sleep disturbance and suicidal ideas. The patient is nervous/anxious.     Vital Signs: BP 132/70   Pulse 72   Temp 98.3 F (36.8 C)   Resp 16   Ht '5\' 7"'$  (1.702 m)   Wt  267 lb 3.2 oz (121.2 kg)   SpO2 96%   BMI 41.85 kg/m    Physical Exam Vitals reviewed.  Constitutional:      General: She is not in acute distress.    Appearance: Normal appearance. She is obese. She is not ill-appearing.  HENT:     Head: Normocephalic and atraumatic.  Eyes:     Pupils: Pupils are equal, round, and reactive to light.  Cardiovascular:     Rate and Rhythm: Normal rate and regular rhythm.  Pulmonary:     Effort: Pulmonary effort is normal. No respiratory distress.  Neurological:     Mental Status: She is alert and oriented to person, place, and time.  Psychiatric:        Mood and Affect: Mood normal.        Behavior: Behavior normal.        Assessment/Plan: 1. Type 2 diabetes mellitus with hyperglycemia, without long-term current use of insulin ([place]) Too soon for A1c. Will go ahead and increase rybelsus, but may consider switch to mounjaro if not helping bg and wt loss goals - Urine Microalbumin w/creat. ratio - Semaglutide (RYBELSUS) 14 MG TABS; Take 1 tablet (14 mg total) by mouth daily.  Dispense: 30 tablet; Refill: 0  2. Mixed hyperlipidemia Continue crestor and update labs - Lipid Panel With LDL/HDL Ratio  3. Acquired hypothyroidism Continue synthroid and update labs - TSH + free T4 - levothyroxine (SYNTHROID) 25 MCG  tablet; Take 1 tablet (25 mcg total) by mouth daily before breakfast.  Dispense: 90 tablet; Refill: 1  4. B12 deficiency - B12 and Folate Panel  5. Other fatigue - CBC w/Diff/Platelet - Comprehensive metabolic panel - Q75 and Folate Panel - TSH + free T4 - Lipid Panel With LDL/HDL Ratio   General Counseling: Litsy verbalizes understanding of the findings of todays visit and agrees with plan of treatment. I have discussed any further diagnostic evaluation that may be needed or ordered today. We also reviewed her medications today. she has been encouraged to call the office with any questions or concerns that should arise related to todays visit.    Orders Placed This Encounter  Procedures   Urine Microalbumin w/creat. ratio   CBC w/Diff/Platelet   Comprehensive metabolic panel   F16 and Folate Panel   TSH + free T4   Lipid Panel With LDL/HDL Ratio    Meds ordered this encounter  Medications   Semaglutide (RYBELSUS) 14 MG TABS    Sig: Take 1 tablet (14 mg total) by mouth daily.    Dispense:  30 tablet    Refill:  0   levothyroxine (SYNTHROID) 25 MCG tablet    Sig: Take 1 tablet (25 mcg total) by mouth daily before breakfast.    Dispense:  90 tablet    Refill:  1    This patient was seen by Drema Dallas, PA-C in collaboration with Dr. Clayborn Bigness as a part of collaborative care agreement.   Total time spent:30 Minutes Time spent includes review of chart, medications, test results, and follow up plan with the patient.      Dr Lavera Guise Internal medicine

## 2022-09-25 DIAGNOSIS — L2089 Other atopic dermatitis: Secondary | ICD-10-CM | POA: Diagnosis not present

## 2022-09-25 DIAGNOSIS — L731 Pseudofolliculitis barbae: Secondary | ICD-10-CM | POA: Diagnosis not present

## 2022-09-25 DIAGNOSIS — L700 Acne vulgaris: Secondary | ICD-10-CM | POA: Diagnosis not present

## 2022-09-25 DIAGNOSIS — L280 Lichen simplex chronicus: Secondary | ICD-10-CM | POA: Diagnosis not present

## 2022-10-01 ENCOUNTER — Telehealth: Payer: Self-pay

## 2022-10-01 ENCOUNTER — Other Ambulatory Visit: Payer: Self-pay

## 2022-10-01 MED ORDER — OSELTAMIVIR PHOSPHATE 75 MG PO CAPS: 75.0000 mg | ORAL_CAPSULE | Freq: Two times a day (BID) | ORAL | 0 refills | Status: DC

## 2022-10-01 NOTE — Telephone Encounter (Signed)
As per lauren send Tamiflu to her phar

## 2022-10-09 DIAGNOSIS — L700 Acne vulgaris: Secondary | ICD-10-CM | POA: Diagnosis not present

## 2022-10-11 ENCOUNTER — Ambulatory Visit: Payer: 59 | Admitting: Psychiatry

## 2022-10-11 ENCOUNTER — Encounter: Payer: Self-pay | Admitting: Psychiatry

## 2022-10-11 DIAGNOSIS — F5101 Primary insomnia: Secondary | ICD-10-CM

## 2022-10-11 DIAGNOSIS — R69 Illness, unspecified: Secondary | ICD-10-CM | POA: Diagnosis not present

## 2022-10-11 DIAGNOSIS — F411 Generalized anxiety disorder: Secondary | ICD-10-CM | POA: Diagnosis not present

## 2022-10-11 DIAGNOSIS — F39 Unspecified mood [affective] disorder: Secondary | ICD-10-CM | POA: Diagnosis not present

## 2022-10-11 MED ORDER — CARIPRAZINE HCL 1.5 MG PO CAPS: 1.5000 mg | ORAL_CAPSULE | Freq: Every day | ORAL | 0 refills | Status: DC

## 2022-10-11 NOTE — Patient Instructions (Signed)
[place] [id number]

## 2022-10-11 NOTE — Progress Notes (Signed)
Ashley Stephens [id number] 02/08/1976 47 y.o.  Subjective:   Patient ID:  Ashley Stephens is a 47 y.o. (DOB 1976-05-26) female.  Chief Complaint:  Chief Complaint  Patient presents with   Depression   Anxiety   Insomnia    Depression        Associated symptoms include insomnia.  Associated symptoms include no headaches.  Past medical history includes anxiety.   Anxiety Symptoms include dizziness and insomnia.    Insomnia PMH includes: depression.    Ashley Stephens presents to the office today for follow-up of depression, anxiety, and insomnia.   Ashley Stephens applied for a job and did not get it. 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 Ashley Stephens was then told there were not any positions immediately available and to check back later. Ashley Stephens was told by another dept Ashley Stephens could not transfer due to a "warning" Ashley Stephens received that Ashley Stephens is not aware of. Ashley Stephens reports that Ashley Stephens had the flu not long after that.   Ashley Stephens reports that Ashley Stephens is staying the bed most of the time. Ashley Stephens reports that Ashley Stephens does not leave the house very often. Ashley Stephens has been sleeping about 5 hours. Difficulty with sleep initiation. Ashley Stephens reports that Ashley Stephens has been taking her medication inconsistently and sometimes taking more or less of certain medications for the last week. Ashley Stephens reports this started after some medication spilled and Ashley Stephens could not recall if Ashley Stephens had taken it already or not. Ashley Stephens reports occasionally taking an additional dose to try to improve her mood or anxiety.   Ashley Stephens reports some mood lability. Ashley Stephens reports irritability. Ashley Stephens reports difficulty with concentration. Denies impulsive or risky behavior.   Ashley Stephens reports that Ashley Stephens was started on Rybelsus- "I feel like the medication works, but then I over-power it." Ashley Stephens reports over-eating "when I do eat." Ashley Stephens describes low energy and motivation. Ashley Stephens reports suicidal thoughts- "it's always the same ones." Ashley Stephens reports chronic  suicidal thoughts are more often.   Ashley Stephens reports racing thoughts and rumination- "what did I do to get like this?" Ashley Stephens reports frequent negative thoughts about herself and replaying her decisions and interactions. Ashley Stephens reports frequent worry. Ashley Stephens had a panic attack earlier today when Ashley Stephens had a flat tire. Ashley Stephens reports some catastrophic thoughts. Ashley Stephens reports some obsessive thoughts and want things done a certain way. "I just want to feel relaxed."   Ashley Stephens reports that Ashley Stephens has not worked since 09/27/22 and "cannot get out the bed." Ashley Stephens reports that Ashley Stephens has been showering about twice a week.   Ashley Stephens reports that Ashley Stephens accidentally threw away a partial that Ashley Stephens spent $3,000 on.   Ashley Stephens reports paranoia. Denies VH. Ashley Stephens reports some possible AH on occ.   Xanax last filled 09/14/22 Ashley Stephens reports that Ashley Stephens typically takes Xanax 0.25 mg 1.5 tabs at night.   Reports that Ashley Stephens may be taking Latuda 2-3 tabs daily over the last week.   Past medication trials: Wellbutrin Effexor Pristiq-effective [place] Lamictal-effective for mood Latuda Rexulti Xanax- Usually takes at night Propanolol- Effective Hydroxyzine Gabapentin- prescribed for back pain. Dizziness, headaches. Lithium Trileptal  AIMS    Flowsheet Row Video Visit from 08/30/2022 in [place] Office Visit from 03/15/2022 in [facility] Office Visit from 10/20/2021 in [place] Office Visit from 09/20/2021 in [place] Office Visit from 07/11/2021 in [facility] [place] Total Score 0  0 0 0 0      GAD-7    Flowsheet Row Office Visit from 12/25/2019 in [facility]  Total GAD-7 Score 16      PHQ2-9    [place] Office Visit from 01/25/2022 in [facility], [facility] Office Visit from 09/28/2021 in [facility], [facility] Office Visit from 06/29/2021 in [facility], [facility] Office Visit from 12/30/2020 in [facility], [facility] Office Visit from 10/03/2020 in [facility], [facility]  PHQ-2 Total Score 0 0 0 0 3      Flowsheet Row Admission (Discharged) from 10/28/2020 in [place] No Risk        Review of Systems:  Review of Systems  Gastrointestinal:  Positive for abdominal pain.  Musculoskeletal:  Negative for gait problem.  Neurological:  Positive for dizziness. Negative for tremors and headaches.  Psychiatric/Behavioral:  Positive for depression. The patient has insomnia.        Please refer to HPI    Medications: I have reviewed the patient's current medications.  Current Outpatient Medications  Medication Sig Dispense Refill   cariprazine (VRAYLAR) 1.5 MG capsule Take 1 capsule (1.5 mg total) by mouth daily. 28 capsule 0   cholecalciferol (VITAMIN D3) 25 MCG (1000 UNIT) tablet Take 1,000 Units by mouth daily.     Cyanocobalamin (VITAMIN B 12 PO) Take by mouth.     levothyroxine (SYNTHROID) 25 MCG tablet Take 1 tablet (25 mcg total) by mouth daily before breakfast. 90 tablet 1   mometasone (ELOCON) 0.1 % cream Apply topically 2 (two) times daily.     omeprazole (PRILOSEC) 40 MG capsule Take 1 capsule (40 mg total) by mouth daily. (Patient taking differently: Take 40 mg by mouth daily as needed.) 30 capsule 3   rosuvastatin (CRESTOR) 5 MG tablet Take 1 tablet by mouth once daily 30 tablet 5   Semaglutide (RYBELSUS) 14 MG TABS Take 1 tablet (14 mg total) by mouth daily. 30 tablet 0   spironolactone (ALDACTONE) 100 MG tablet Take 100 mg by mouth daily.     Adapalene-Benzoyl Peroxide 0.3-2.5 % GEL SMARTSIG:Topical Every Evening     ALPRAZolam (XANAX) 0.25 MG tablet Take 1 tablet (0.25 mg total) by mouth 2 (two) times daily as needed. for anxiety 45 tablet 5   clindamycin (CLINDAGEL) 1 % gel SMARTSIG:Sparingly Topical Every Morning     desvenlafaxine (PRISTIQ) 100 MG 24  hr tablet Take 1 tablet (100 mg total) by mouth every morning. 90 tablet 1   desvenlafaxine (PRISTIQ) 50 MG 24 hr tablet Take 1 tablet (50 mg total) by mouth at bedtime. 90 tablet 1   Dextromethorphan-buPROPion ER (AUVELITY) 45-105 MG TBCR Take 45-105 mg by mouth 2 (two) times daily. 180 tablet 1   hydrOXYzine (VISTARIL) 50 MG capsule Take 1 capsule by mouth three times daily as needed 270 capsule 1   lamoTRIgine (LAMICTAL) 150 MG tablet Take 1 tab daily for 2 weeks, then increase to 1 tab BID 180 tablet 1   lurasidone (LATUDA) 80 MG TABS tablet Take 1 tablet (80 mg total) by mouth daily with supper. 90 tablet 1   oseltamivir (TAMIFLU) 75 MG capsule Take 1 capsule (75 mg total) by mouth 2 (two) times daily. 10 capsule 0   Oxcarbazepine (TRILEPTAL) 300 MG tablet Take 1 tablet (300 mg total) by mouth 2 (two) times daily. 180 tablet 1   propranolol (INDERAL) 20 MG tablet Take 1 tablet (  20 mg total) by mouth 3 (three) times daily as needed. 270 tablet 1   No current facility-administered medications for this visit.    Medication Side Effects: None  Allergies: No Known Allergies  Past Medical History:  Diagnosis Date   Anxiety    Depression    Diabetes mellitus without complication (HCC)    Gastric ulcer    GERD (gastroesophageal reflux disease)    Hypertension    Osteoarthritis    Thyroid disorder     Past Medical History, Surgical history, Social history, and Family history were reviewed and updated as appropriate.   Please see review of systems for further details on the patient's review from today.   Objective:   Physical Exam:  There were no vitals taken for this visit.  Physical Exam  Lab Review:     Component Value Date/Time   NA 142 01/25/2022 0947   NA 142 12/03/2011 1059   K 4.5 01/25/2022 0947   K 3.4 (L) 12/03/2011 1059   CL 104 01/25/2022 0947   CL 107 12/03/2011 1059   CO2 25 01/25/2022 0947   CO2 27 12/03/2011 1059   GLUCOSE 101 (H) 01/25/2022 0947    GLUCOSE 92 07/12/2020 1526   GLUCOSE 66 12/03/2011 1059   BUN 7 01/25/2022 0947   BUN 4 (L) 12/03/2011 1059   CREATININE 0.94 01/25/2022 0947   CREATININE 0.82 12/03/2011 1059   CALCIUM 9.5 01/25/2022 0947   CALCIUM 8.5 12/03/2011 1059   PROT 6.5 01/25/2022 0947   ALBUMIN 4.3 01/25/2022 0947   AST 16 01/25/2022 0947   ALT 14 01/25/2022 0947   ALKPHOS 77 01/25/2022 0947   BILITOT 0.2 01/25/2022 0947   GFRNONAA >60 07/12/2020 1526   GFRNONAA >60 12/03/2011 1059   GFRAA 78 07/10/2019 0953   GFRAA >60 12/03/2011 1059       Component Value Date/Time   WBC 4.4 01/25/2022 0947   WBC 7.0 07/12/2020 1526   RBC 4.85 01/25/2022 0947   RBC 4.76 07/12/2020 1526   HGB 14.6 01/25/2022 0947   HCT 42.0 01/25/2022 0947   PLT 294 01/25/2022 0947   MCV 87 01/25/2022 0947   MCH 30.1 01/25/2022 0947   MCH 29.4 07/12/2020 1526   MCHC 34.8 01/25/2022 0947   MCHC 33.3 07/12/2020 1526   RDW 12.8 01/25/2022 0947   LYMPHSABS 1.6 01/25/2022 0947   MONOABS 0.6 07/12/2020 1526   EOSABS 0.1 01/25/2022 0947   BASOSABS 0.0 01/25/2022 0947    No results found for: "POCLITH", "LITHIUM"   No results found for: "PHENYTOIN", "PHENOBARB", "VALPROATE", "CBMZ"   .res Assessment: Plan:   Pt seen for 45 minutes and discussed treatment options and safety plan. Ashley Stephens contracts for safety and has [place] information. Discussed higher level of care due to severity of symptoms and recommended PHP since PHP was helpful for her during a previous exacerbation of mood and anxiety symptoms. Discussed Serita Grammes and pt reports that Ashley Stephens had read information about this PHP program in the past and taken a virtual tour. Provided pt with contact information for [facility] and Ashley Stephens reports that Ashley Stephens will call them to schedule an initial assessment/intake apt.  Discussed that Latuda no longer seems to be effective and pt reports that Ashley Stephens has been taking several additional Latuda tabs daily over the last week in hopes that it  would "start working," and reports that Ashley Stephens has not experienced any recent benefit with Latuda at any dose. Will decrease Latuda to 80 mg  daily for 4 days, then 1/2 tablet daily for 4 days, then stop.  Will start Vraylar 1.5 mg po qd for mood stabilization and specifically to target low energy and low motivation.  Will continue all other medications as prescribed at this time.  Patient advised to contact office with any questions, adverse effects, or acute worsening in signs and symptoms.   Rosarie was seen today for depression, anxiety and insomnia.  Diagnoses and all orders for this visit:  Mood disorder ([place]) -     cariprazine (VRAYLAR) 1.5 MG capsule; Take 1 capsule (1.5 mg total) by mouth daily.  Generalized anxiety disorder  Primary insomnia     Please see After Visit Summary for patient specific instructions.  Future Appointments  Date Time Provider [place]  10/26/2022  8:40 AM McDonough, Si Gaul, PA-C [facility] None  11/07/2022  9:00 AM [name], PMHNP [facility] None  01/31/2023 11:00 AM McDonough, Si Gaul, PA-C [facility] None    No orders of the defined types were placed in this encounter.   -------------------------------

## 2022-10-12 ENCOUNTER — Telehealth: Payer: Self-pay | Admitting: Psychiatry

## 2022-10-12 NOTE — Telephone Encounter (Signed)
Received FMLA form from [employer] to complete. Placed in Traci's box to complete.

## 2022-10-15 ENCOUNTER — Other Ambulatory Visit: Payer: Self-pay | Admitting: Physician Assistant

## 2022-10-15 DIAGNOSIS — E1165 Type 2 diabetes mellitus with hyperglycemia: Secondary | ICD-10-CM

## 2022-10-15 NOTE — Telephone Encounter (Signed)
Addendum to attached message. Received 2nd FMLA from [employer] today. Placed in Traci's box.

## 2022-10-16 ENCOUNTER — Telehealth: Payer: Self-pay | Admitting: Psychiatry

## 2022-10-16 NOTE — Telephone Encounter (Signed)
Received fax from [employer] re: Ashley Stephens. Healthcare Provider Medical Certification needs completed. Placed in Traci's box to complete.

## 2022-10-19 DIAGNOSIS — Z0289 Encounter for other administrative examinations: Secondary | ICD-10-CM

## 2022-10-19 NOTE — Telephone Encounter (Signed)
Noted. Will be faxed this afternoon or Monday morning depending on signature of provider

## 2022-10-19 NOTE — Telephone Encounter (Signed)
Pt called to make appt with Janett Billow.  She asked about the status of FMLA paperwork.  Original paperwork was sent to [place] on 2/9, per previous open encounter.  She said it needs to be sent back today, if possible and Monday at the latest.  Next appt 3/6

## 2022-10-19 NOTE — Telephone Encounter (Signed)
Please see message about FMLA paperwork.

## 2022-10-22 ENCOUNTER — Emergency Department (HOSPITAL_COMMUNITY)
Admission: EM | Admit: 2022-10-22 | Discharge: 2022-10-22 | Discharge status: Against Medical Advice | Payer: 59 | Attending: Emergency Medicine | Admitting: Emergency Medicine

## 2022-10-22 ENCOUNTER — Other Ambulatory Visit: Payer: Self-pay

## 2022-10-22 ENCOUNTER — Encounter (HOSPITAL_COMMUNITY): Payer: Self-pay | Admitting: Emergency Medicine

## 2022-10-22 DIAGNOSIS — R109 Unspecified abdominal pain: Secondary | ICD-10-CM | POA: Diagnosis not present

## 2022-10-22 DIAGNOSIS — R197 Diarrhea, unspecified: Secondary | ICD-10-CM | POA: Diagnosis not present

## 2022-10-22 DIAGNOSIS — R110 Nausea: Secondary | ICD-10-CM | POA: Diagnosis not present

## 2022-10-22 DIAGNOSIS — R531 Weakness: Secondary | ICD-10-CM | POA: Diagnosis not present

## 2022-10-22 DIAGNOSIS — R000 Tachycardia, unspecified: Secondary | ICD-10-CM | POA: Diagnosis not present

## 2022-10-22 DIAGNOSIS — R1084 Generalized abdominal pain: Secondary | ICD-10-CM | POA: Diagnosis not present

## 2022-10-22 LAB — CBC WITH DIFFERENTIAL/PLATELET
Abs Immature Granulocytes: 0.05 10*3/uL (ref 0.00–0.07)
Basophils Absolute: 0 10*3/uL (ref 0.0–0.1)
Basophils Relative: 0 %
Eosinophils Absolute: 0.1 10*3/uL (ref 0.0–0.5)
Eosinophils Relative: 1 %
HCT: 50 % — ABNORMAL HIGH (ref 36.0–46.0)
Hemoglobin: 17.5 g/dL — ABNORMAL HIGH (ref 12.0–15.0)
Immature Granulocytes: 1 %
Lymphocytes Relative: 17 %
Lymphs Abs: 1.7 10*3/uL (ref 0.7–4.0)
MCH: 30.2 pg (ref 26.0–34.0)
MCHC: 35 g/dL (ref 30.0–36.0)
MCV: 86.2 fL (ref 80.0–100.0)
Monocytes Absolute: 0.5 10*3/uL (ref 0.1–1.0)
Monocytes Relative: 5 %
Neutro Abs: 7.9 10*3/uL — ABNORMAL HIGH (ref 1.7–7.7)
Neutrophils Relative %: 76 %
Platelets: 374 10*3/uL (ref 150–400)
RBC: 5.8 MIL/uL — ABNORMAL HIGH (ref 3.87–5.11)
RDW: 12.9 % (ref 11.5–15.5)
WBC: 10.3 10*3/uL (ref 4.0–10.5)
nRBC: 0 % (ref 0.0–0.2)

## 2022-10-22 LAB — COMPREHENSIVE METABOLIC PANEL
ALT: 22 U/L (ref 0–44)
AST: 26 U/L (ref 15–41)
Albumin: 5.2 g/dL — ABNORMAL HIGH (ref 3.5–5.0)
Alkaline Phosphatase: 90 U/L (ref 38–126)
Anion gap: 15 (ref 5–15)
BUN: 12 mg/dL (ref 6–20)
CO2: 18 mmol/L — ABNORMAL LOW (ref 22–32)
Calcium: 10.5 mg/dL — ABNORMAL HIGH (ref 8.9–10.3)
Chloride: 101 mmol/L (ref 98–111)
Creatinine, Ser: 1.21 mg/dL — ABNORMAL HIGH (ref 0.44–1.00)
GFR, Estimated: 55 mL/min — ABNORMAL LOW (ref >60)
Glucose, Bld: 162 mg/dL — ABNORMAL HIGH (ref 70–99)
Potassium: 4.3 mmol/L (ref 3.5–5.1)
Sodium: 134 mmol/L — ABNORMAL LOW (ref 135–145)
Total Bilirubin: 0.3 mg/dL (ref 0.3–1.2)
Total Protein: 8.7 g/dL — ABNORMAL HIGH (ref 6.5–8.1)

## 2022-10-22 LAB — LIPASE, BLOOD: Lipase: 58 U/L — ABNORMAL HIGH (ref 11–51)

## 2022-10-22 MED ORDER — METOCLOPRAMIDE HCL 5 MG/ML IJ SOLN
10.0000 mg | Freq: Once | INTRAMUSCULAR | Status: AC
  Administered 2022-10-22: 10 mg via INTRAVENOUS
  Filled 2022-10-22: qty 2

## 2022-10-22 MED ORDER — DICYCLOMINE HCL 10 MG/ML IM SOLN
20.0000 mg | Freq: Once | INTRAMUSCULAR | Status: AC
  Administered 2022-10-22: 20 mg via INTRAMUSCULAR
  Filled 2022-10-22: qty 2

## 2022-10-22 MED ORDER — SODIUM CHLORIDE 0.9 % IV BOLUS
1000.0000 mL | Freq: Once | INTRAVENOUS | Status: AC
  Administered 2022-10-22: 1000 mL via INTRAVENOUS

## 2022-10-22 NOTE — ED Triage Notes (Signed)
Pt BIB EMS from home complaining of abdominal pain, diarrhea and weakness for a couple hours. History of hysterectomy. No emesis with EMS.   EMS Vitals 128/94 102 HR 100% on RA CBG 127

## 2022-10-22 NOTE — ED Provider Notes (Signed)
[facility] Provider Note   CSN: [id number] Arrival date & time: 10/22/22  1832     History  Chief Complaint  Patient presents with   Abdominal Pain    Ashley Stephens is a 47 y.o. female with history of gastric bypass and hysterectomy presenting to the ED with sudden onset of abdominal pain, muscle weakness, fatigue, nausea.  She reports this began abruptly today.  She was having muscle weakness all over, headache, uncontrollable loose bowel movements, and cramping abdominal pain.  HPI     Home Medications Prior to Admission medications   Medication Sig Start Date End Date Taking? Authorizing Provider  Adapalene-Benzoyl Peroxide 0.3-2.5 % GEL SMARTSIG:Topical Every Evening    [provider]  ALPRAZolam (XANAX) 0.25 MG tablet Take 1 tablet (0.25 mg total) by mouth 2 (two) times daily as needed. for anxiety 09/12/22   [name], PMHNP  cariprazine (VRAYLAR) 1.5 MG capsule Take 1 capsule (1.5 mg total) by mouth daily. 10/11/22   [name], PMHNP  cholecalciferol (VITAMIN D3) 25 MCG (1000 UNIT) tablet Take 1,000 Units by mouth daily.    [provider]  clindamycin (CLINDAGEL) 1 % gel SMARTSIG:Sparingly Topical Every Morning    [provider]  Cyanocobalamin (VITAMIN B 12 PO) Take by mouth.    [provider]  desvenlafaxine (PRISTIQ) 100 MG 24 hr tablet Take 1 tablet (100 mg total) by mouth every morning. 08/30/22   [name], PMHNP  desvenlafaxine (PRISTIQ) 50 MG 24 hr tablet Take 1 tablet (50 mg total) by mouth at bedtime. 08/30/22 02/26/23  [name], PMHNP  Dextromethorphan-buPROPion ER (AUVELITY) 45-105 MG TBCR Take 45-105 mg by mouth 2 (two) times daily. 07/19/22 10/17/22  [name], PMHNP  hydrOXYzine (VISTARIL) 50 MG capsule Take 1 capsule by mouth three times daily as needed 08/30/22   [name], PMHNP  lamoTRIgine (LAMICTAL) 150 MG tablet Take 1 tab daily  for 2 weeks, then increase to 1 tab BID 07/19/22   [name], PMHNP  levothyroxine (SYNTHROID) 25 MCG tablet Take 1 tablet (25 mcg total) by mouth daily before breakfast. 09/20/22   McDonough, Si Gaul, PA-C  lurasidone (LATUDA) 80 MG TABS tablet Take 1 tablet (80 mg total) by mouth daily with supper. 07/19/22   [name], PMHNP  mometasone (ELOCON) 0.1 % cream Apply topically 2 (two) times daily. 09/25/22   [provider]  omeprazole (PRILOSEC) 40 MG capsule Take 1 capsule (40 mg total) by mouth daily. Patient taking differently: Take 40 mg by mouth daily as needed. 10/03/20   McDonough, Si Gaul, PA-C  oseltamivir (TAMIFLU) 75 MG capsule Take 1 capsule (75 mg total) by mouth 2 (two) times daily. 10/01/22   McDonough, Si Gaul, PA-C  Oxcarbazepine (TRILEPTAL) 300 MG tablet Take 1 tablet (300 mg total) by mouth 2 (two) times daily. 08/30/22 11/28/22  [name], PMHNP  propranolol (INDERAL) 20 MG tablet Take 1 tablet (20 mg total) by mouth 3 (three) times daily as needed. 08/30/22   [name], PMHNP  rosuvastatin (CRESTOR) 5 MG tablet Take 1 tablet by mouth once daily 08/20/22   Jonetta Osgood, NP  RYBELSUS 14 MG TABS Take 1 tablet by mouth once daily 10/15/22   McDonough, Si Gaul, PA-C  spironolactone (ALDACTONE) 100 MG tablet Take 100 mg by mouth daily. 10/06/21   [provider]      Allergies    Patient has no known allergies.    Review of Systems  Review of Systems  Physical Exam Updated Vital Signs BP 115/79   Pulse (!) 106   Temp 97.6 F (36.4 C) (Oral)   Resp (!) 26   SpO2 100%  Physical Exam Constitutional:      General: She is not in acute distress. HENT:     Head: Normocephalic and atraumatic.  Eyes:     Conjunctiva/sclera: Conjunctivae normal.     Pupils: Pupils are equal, round, and reactive to light.  Cardiovascular:     Rate and Rhythm: Normal rate and regular rhythm.  Pulmonary:     Effort: Pulmonary effort is normal. No  respiratory distress.  Abdominal:     General: There is no distension.     Tenderness: There is generalized abdominal tenderness.  Skin:    General: Skin is warm and dry.  Neurological:     General: No focal deficit present.     Mental Status: She is alert. Mental status is at baseline.  Psychiatric:        Mood and Affect: Mood normal.        Behavior: Behavior normal.     ED Results / Procedures / Treatments   Labs (all labs ordered are listed, but only abnormal results are displayed) Labs Reviewed  COMPREHENSIVE METABOLIC PANEL - Abnormal; Notable for the following components:      Result Value   Sodium 134 (*)    CO2 18 (*)    Glucose, Bld 162 (*)    Creatinine, Ser 1.21 (*)    Calcium 10.5 (*)    Total Protein 8.7 (*)    Albumin 5.2 (*)    GFR, Estimated 55 (*)    All other components within normal limits  CBC WITH DIFFERENTIAL/PLATELET - Abnormal; Notable for the following components:   RBC 5.80 (*)    Hemoglobin 17.5 (*)    HCT 50.0 (*)    Neutro Abs 7.9 (*)    All other components within normal limits  LIPASE, BLOOD - Abnormal; Notable for the following components:   Lipase 58 (*)    All other components within normal limits  RESP PANEL BY RT-PCR (RSV, FLU A&B, COVID)  RVPGX2    EKG None  Radiology No results found.  Procedures Procedures    Medications Ordered in ED Medications  sodium chloride 0.9 % bolus 1,000 mL (0 mLs Intravenous Stopped 10/22/22 2019)  metoCLOPramide (REGLAN) injection 10 mg (10 mg Intravenous Given 10/22/22 1920)  dicyclomine (BENTYL) injection 20 mg (20 mg Intramuscular Given 10/22/22 1922)    ED Course/ Medical Decision Making/ A&P                             Medical Decision Making Amount and/or Complexity of Data Reviewed Labs: ordered. Radiology: ordered.  Risk Prescription drug management.   This patient presents to the Emergency Department with complaint of abdominal pain. This involves an extensive number of  treatment options, and is a complaint that carries with it a high risk of complications and morbidity.  The differential diagnosis includes, but is not limited to, gastritis vs biliary disease vs peptic ulcer vs constipation vs colitis vs UTI vs other  The patient eloped from the emergency department prior to completion of her workup.  I reviewed her labs which showed white blood cell count 10.3.  Lipase very minor elevation at 58.  CMP near baseline levels, with some minor elevation of creatinine 1.2.  CT scan ordered but  patient eloped prior to her scan.  She was given Bentyl and Reglan and fluids for nausea and cramping abdominal pain.    No immediate life-threatening emergencies were identified based on initial assessment, however the patient eloped prior to completion of her full medical workup.        Final Clinical Impression(s) / ED Diagnoses Final diagnoses:  Abdominal pain, unspecified abdominal location    Rx / DC Orders ED Discharge Orders     None         Wyvonnia Dusky, MD 10/22/22 2201

## 2022-10-23 ENCOUNTER — Telehealth: Payer: Self-pay | Admitting: Psychiatry

## 2022-10-23 NOTE — Telephone Encounter (Signed)
Faxed ppwk to [facility] 2/19 & 2/20. Pt notified. [phone]

## 2022-10-26 ENCOUNTER — Encounter: Payer: Self-pay | Admitting: Physician Assistant

## 2022-10-26 ENCOUNTER — Ambulatory Visit (INDEPENDENT_AMBULATORY_CARE_PROVIDER_SITE_OTHER): Payer: 59 | Admitting: Physician Assistant

## 2022-10-26 VITALS — BP 131/71 | HR 86 | Temp 97.2°F | Resp 16 | Ht 67.0 in | Wt 261.2 lb

## 2022-10-26 DIAGNOSIS — Z20822 Contact with and (suspected) exposure to covid-19: Secondary | ICD-10-CM | POA: Diagnosis not present

## 2022-10-26 DIAGNOSIS — R197 Diarrhea, unspecified: Secondary | ICD-10-CM

## 2022-10-26 DIAGNOSIS — R112 Nausea with vomiting, unspecified: Secondary | ICD-10-CM

## 2022-10-26 DIAGNOSIS — R509 Fever, unspecified: Secondary | ICD-10-CM | POA: Diagnosis not present

## 2022-10-26 DIAGNOSIS — N390 Urinary tract infection, site not specified: Secondary | ICD-10-CM | POA: Diagnosis not present

## 2022-10-26 DIAGNOSIS — E1165 Type 2 diabetes mellitus with hyperglycemia: Secondary | ICD-10-CM | POA: Diagnosis not present

## 2022-10-26 DIAGNOSIS — R1084 Generalized abdominal pain: Secondary | ICD-10-CM

## 2022-10-26 DIAGNOSIS — R1013 Epigastric pain: Secondary | ICD-10-CM | POA: Diagnosis not present

## 2022-10-26 DIAGNOSIS — R109 Unspecified abdominal pain: Secondary | ICD-10-CM | POA: Diagnosis not present

## 2022-10-26 DIAGNOSIS — R1011 Right upper quadrant pain: Secondary | ICD-10-CM | POA: Diagnosis not present

## 2022-10-26 LAB — POCT GLYCOSYLATED HEMOGLOBIN (HGB A1C): Hemoglobin A1C: 6.2 % — AB (ref 4.0–5.6)

## 2022-10-26 NOTE — Progress Notes (Signed)
[facility] [place], [place] [address]  Internal MEDICINE  Office Visit Note  Patient Name: Ashley Stephens  [id number]  [id number]  Date of Service: 10/26/2022  Chief Complaint  Patient presents with   Follow-up    HPI Pt is here for routine follow up and A1c check, but upon entering exam room she states she is already planning on going to go to ED when she leaves office -On Monday felt very tired and then started to feel weak and had diarrhea and vomiting with abdominal pain. Went to ED. She was given some medication for pain. States she felt like she was going to die in hospital and felt she needed to get out of hospital and panicked and left AMA. She states the staff were very nice, but panic took over and led to her leaving before a full workup could be done. She did have some labs done that are a little abnormal -Went home and laid down but symptoms have continued since then and have not improved any -Continuous loose diarrhea, yellowish brown, no signs of blood -urinary frequency, but no burning or urgency. Had obtained urine for microalbumin level, but she does want it sent to be checked for any infection as well -did have spironolactone increased 1.49month ago by her dermatologist. She did not take it yesterday. She was wondering if that could have affected anything. She does have a little breast tenderness on right recently as well. -She also was increased on her rybelsus from '7mg'$  to 14 mg a little over 1 month ago. Will stop rybelsus now. -Discussed that her labs from ED show slight increase lipase, low sodium, elevated Creatinine/low GFR, and elevated calcium, but given continued diarrhea these labs likely have changed further. Discussed that spironolactone can increase potassium, lead to fatigue, weakness, worsening kidney function, and diarrhea and her recent increase in dose may be causing some of symptoms and will likely need to go back down on  this. Additionally though rybelsus can similarly cause more burping, and GI distress as well as raise lipase and in serious cases lead to pancreatitis, so will go ahead and stop this for now. -Her abdominal pain is mainly epigastric, but also along right side. Given acute symptoms pt will proceed to ED as planned and will likely need IV fluids given GI loss  Current Medication: Outpatient Encounter Medications as of 10/26/2022  Medication Sig Note   Adapalene-Benzoyl Peroxide 0.3-2.5 % GEL SMARTSIG:Topical Every Evening    ALPRAZolam (XANAX) 0.25 MG tablet Take 1 tablet (0.25 mg total) by mouth 2 (two) times daily as needed. for anxiety    cariprazine (VRAYLAR) 1.5 MG capsule Take 1 capsule (1.5 mg total) by mouth daily.    cholecalciferol (VITAMIN D3) 25 MCG (1000 UNIT) tablet Take 1,000 Units by mouth daily.    clindamycin (CLINDAGEL) 1 % gel SMARTSIG:Sparingly Topical Every Morning    Cyanocobalamin (VITAMIN B 12 PO) Take by mouth.    desvenlafaxine (PRISTIQ) 100 MG 24 hr tablet Take 1 tablet (100 mg total) by mouth every morning.    desvenlafaxine (PRISTIQ) 50 MG 24 hr tablet Take 1 tablet (50 mg total) by mouth at bedtime.    hydrOXYzine (VISTARIL) 50 MG capsule Take 1 capsule by mouth three times daily as needed    lamoTRIgine (LAMICTAL) 150 MG tablet Take 1 tab daily for 2 weeks, then increase to 1 tab BID    levothyroxine (SYNTHROID) 25 MCG tablet Take 1 tablet (25 mcg total)  by mouth daily before breakfast.    lurasidone (LATUDA) 80 MG TABS tablet Take 1 tablet (80 mg total) by mouth daily with supper.    mometasone (ELOCON) 0.1 % cream Apply topically 2 (two) times daily.    omeprazole (PRILOSEC) 40 MG capsule Take 1 capsule (40 mg total) by mouth daily. (Patient taking differently: Take 40 mg by mouth daily as needed.) 03/21/2021: Prescribed protonix    oseltamivir (TAMIFLU) 75 MG capsule Take 1 capsule (75 mg total) by mouth 2 (two) times daily.    Oxcarbazepine (TRILEPTAL) 300 MG  tablet Take 1 tablet (300 mg total) by mouth 2 (two) times daily.    propranolol (INDERAL) 20 MG tablet Take 1 tablet (20 mg total) by mouth 3 (three) times daily as needed.    rosuvastatin (CRESTOR) 5 MG tablet Take 1 tablet by mouth once daily    RYBELSUS 14 MG TABS Take 1 tablet by mouth once daily    spironolactone (ALDACTONE) 100 MG tablet Take 100 mg by mouth daily.    Dextromethorphan-buPROPion ER (AUVELITY) 45-105 MG TBCR Take 45-105 mg by mouth 2 (two) times daily.    No facility-administered encounter medications on file as of 10/26/2022.    Surgical History: Past Surgical History:  Procedure Laterality Date   ABDOMINAL HYSTERECTOMY  2013   BARIATRIC SURGERY     BREAST BIOPSY  1995   COLONOSCOPY WITH PROPOFOL N/A 10/28/2020   Procedure: COLONOSCOPY WITH PROPOFOL;  Surgeon: Jonathon Bellows, MD;  Location: [facility] ENDOSCOPY;  Service: Gastroenterology;  Laterality: N/A;   GASTRIC BYPASS  2012   TUBAL LIGATION  1996   tummy tuck  05/27/2020    Medical History: Past Medical History:  Diagnosis Date   Anxiety    Depression    Diabetes mellitus without complication ([place])    Gastric ulcer    GERD (gastroesophageal reflux disease)    Hypertension    Osteoarthritis    Thyroid disorder     Family History: Family History  Problem Relation Age of Onset   Breast cancer Maternal Grandmother    Diabetes Maternal Grandmother    Heart disease Maternal Grandmother    Hypertension Maternal Grandmother    Heart disease Brother    Hypertension Brother    Hyperlipidemia Brother    Diabetes Maternal Aunt    Heart disease Maternal Aunt    Hypertension Maternal Aunt    Hyperlipidemia Maternal Aunt    Diabetes Maternal Aunt    Heart disease Maternal Aunt    Hypertension Maternal Aunt    Hyperlipidemia Maternal Aunt    Diabetes Maternal Aunt    Heart disease Maternal Aunt    Hypertension Maternal Aunt    Hyperlipidemia Maternal Aunt     Social History   Socioeconomic History    Marital status: Single    Spouse name: Not on file   Number of children: 1   Years of education: Not on file   Highest education level: Not on file  Occupational History   Occupation: Engineer, drilling  Tobacco Use   Smoking status: Never   Smokeless tobacco: Never  Vaping Use   Vaping Use: Never used  Substance and Sexual Activity   Alcohol use: No    Alcohol/week: 0.0 standard drinks of alcohol   Drug use: No   Sexual activity: Yes    Birth control/protection: Surgical  Other Topics Concern   Not on file  Social History Narrative   Not on file   Social Determinants of Health  Financial Resource Strain: Not on file  Food Insecurity: No Food Insecurity (08/22/2020)   Hunger Vital Sign    Worried About Running Out of Food in the Last Year: Never true    Ran Out of Food in the Last Year: Never true  Transportation Needs: No Transportation Needs (08/22/2020)   PRAPARE - Hydrologist (Medical): No    Lack of Transportation (Non-Medical): No  Physical Activity: Not on file  Stress: Not on file  Social Connections: Not on file  Intimate Partner Violence: Not on file      Review of Systems  Constitutional:  Positive for appetite change and fatigue. Negative for chills and unexpected weight change.  HENT:  Negative for congestion, rhinorrhea, sneezing and sore throat.   Eyes:  Negative for redness.  Respiratory:  Negative for cough, chest tightness and shortness of breath.   Cardiovascular:  Negative for chest pain and palpitations.  Gastrointestinal:  Positive for abdominal pain, diarrhea, nausea and vomiting. Negative for blood in stool and constipation.  Genitourinary:  Positive for frequency. Negative for difficulty urinating, dysuria and urgency.  Musculoskeletal:  Negative for arthralgias, back pain, joint swelling and neck pain.  Skin:  Negative for rash.  Neurological:  Positive for weakness. Negative for tremors and numbness.   Hematological:  Negative for adenopathy. Does not bruise/bleed easily.  Psychiatric/Behavioral:  Negative for behavioral problems (Depression), sleep disturbance and suicidal ideas. The patient is nervous/anxious.     Vital Signs: BP 131/71   Pulse 86   Temp (!) 97.2 F (36.2 C)   Resp 16   Ht '5\' 7"'$  (1.702 m)   Wt 261 lb 3.2 oz (118.5 kg)   SpO2 96%   BMI 40.91 kg/m    Physical Exam Vitals reviewed.  Constitutional:      Appearance: She is obese. She is ill-appearing and diaphoretic.  HENT:     Head: Normocephalic and atraumatic.  Eyes:     Pupils: Pupils are equal, round, and reactive to light.  Cardiovascular:     Rate and Rhythm: Normal rate and regular rhythm.  Pulmonary:     Effort: Pulmonary effort is normal. No respiratory distress.  Abdominal:     General: There is no distension.     Tenderness: There is abdominal tenderness.     Comments: Generalized tenderness, worse epigastric and along right side  Musculoskeletal:        General: Normal range of motion.  Skin:    General: Skin is warm.  Neurological:     Mental Status: She is alert and oriented to person, place, and time.  Psychiatric:        Mood and Affect: Mood normal.        Behavior: Behavior normal.        Assessment/Plan: 1. Generalized abdominal pain Pt will go to ED for further evaluation given timeline and severity of symptoms and inability to rule out pancreatitis/appendicitis and dehydration. Possible med side effects contributing and will d/c rybelsus and suggest decrease spironolactone back down with dermatology pending ED findings.   2. Nausea, vomiting, and diarrhea Pt will go to ED for further evaluation given timeline and severity of symptoms and inability to rule out pancreatitis/appendicitis and dehydration. Possible med side effects contributing and will d/c rybelsus and suggest decrease spironolactone back down with dermatology pending ED findings.   3. Type 2 diabetes  mellitus with hyperglycemia, without long-term current use of insulin (HCC) - POCT glycosylated hemoglobin (Hb A1C)  is 6.2 which is up from last check. Will d/c rybelsus for now and adjust meds once feeling better. - Urine Microalbumin w/creat. ratio  4. Urinary tract infection without hematuria, site unspecified - CULTURE, URINE COMPREHENSIVE   General Counseling: Eavan verbalizes understanding of the findings of todays visit and agrees with plan of treatment. I have discussed any further diagnostic evaluation that may be needed or ordered today. We also reviewed her medications today. she has been encouraged to call the office with any questions or concerns that should arise related to todays visit.    Orders Placed This Encounter  Procedures   CULTURE, URINE COMPREHENSIVE   Urine Microalbumin w/creat. ratio   POCT glycosylated hemoglobin (Hb A1C)    No orders of the defined types were placed in this encounter.   This patient was seen by Drema Dallas, PA-C in collaboration with Dr. Clayborn Bigness as a part of collaborative care agreement.   Total time spent:35 Minutes Time spent includes review of chart, medications, test results, and follow up plan with the patient.      Dr Lavera Guise Internal medicine

## 2022-10-30 ENCOUNTER — Telehealth: Payer: Self-pay | Admitting: Physician Assistant

## 2022-10-30 LAB — MICROALBUMIN / CREATININE URINE RATIO
Creatinine, Urine: 252.7 mg/dL
Microalb/Creat Ratio: 6 mg/g{creat} (ref 0–29)
Microalbumin, Urine: 15.3 ug/mL

## 2022-10-30 LAB — CULTURE, URINE COMPREHENSIVE

## 2022-11-01 DIAGNOSIS — F331 Major depressive disorder, recurrent, moderate: Secondary | ICD-10-CM | POA: Diagnosis not present

## 2022-11-01 NOTE — Telephone Encounter (Signed)
Error

## 2022-11-02 DIAGNOSIS — F331 Major depressive disorder, recurrent, moderate: Secondary | ICD-10-CM | POA: Diagnosis not present

## 2022-11-06 ENCOUNTER — Encounter: Payer: Self-pay | Admitting: Internal Medicine

## 2022-11-06 ENCOUNTER — Telehealth: Payer: Self-pay | Admitting: Internal Medicine

## 2022-11-06 ENCOUNTER — Other Ambulatory Visit: Payer: Self-pay | Admitting: Internal Medicine

## 2022-11-06 ENCOUNTER — Ambulatory Visit: Payer: 59 | Admitting: Internal Medicine

## 2022-11-06 VITALS — BP 139/86 | HR 98 | Temp 98.0°F | Resp 16 | Ht 67.0 in | Wt 261.6 lb

## 2022-11-06 DIAGNOSIS — E1165 Type 2 diabetes mellitus with hyperglycemia: Secondary | ICD-10-CM | POA: Diagnosis not present

## 2022-11-06 DIAGNOSIS — R319 Hematuria, unspecified: Secondary | ICD-10-CM

## 2022-11-06 DIAGNOSIS — R300 Dysuria: Secondary | ICD-10-CM | POA: Diagnosis not present

## 2022-11-06 DIAGNOSIS — N390 Urinary tract infection, site not specified: Secondary | ICD-10-CM

## 2022-11-06 DIAGNOSIS — B3731 Acute candidiasis of vulva and vagina: Secondary | ICD-10-CM | POA: Diagnosis not present

## 2022-11-06 LAB — POCT URINALYSIS DIPSTICK
Bilirubin, UA: NEGATIVE
Glucose, UA: NEGATIVE
Ketones, UA: NEGATIVE
Nitrite, UA: NEGATIVE
Protein, UA: NEGATIVE
Spec Grav, UA: 1.01 (ref 1.010–1.025)
Urobilinogen, UA: 0.2 E.U./dL
pH, UA: 5 (ref 5.0–8.0)

## 2022-11-06 MED ORDER — CIPROFLOXACIN HCL 500 MG PO TABS: 500.0000 mg | ORAL_TABLET | Freq: Two times a day (BID) | ORAL | 0 refills | Status: AC

## 2022-11-06 MED ORDER — OZEMPIC (0.25 OR 0.5 MG/DOSE) 2 MG/3ML ~~LOC~~ SOPN: PEN_INJECTOR | SUBCUTANEOUS | 3 refills | Status: DC

## 2022-11-06 MED ORDER — FLUCONAZOLE 150 MG PO TABS: ORAL_TABLET | ORAL | 0 refills | Status: DC

## 2022-11-06 NOTE — Progress Notes (Signed)
[facility] [place], [place] [address]  Internal MEDICINE  Office Visit Note  Patient Name: Ashley Stephens  [id number]  [id number]  Date of Service: 11/27/2022  Chief Complaint  Patient presents with   Acute Visit    uti    HPI Pt is seen for acute and sick visit. Has dysuria symptoms, very uncomfortable  DM, will like to get Ozempic, was on backorder  She also develops yeast infection after abx use      Current Medication: Outpatient Encounter Medications as of 11/06/2022  Medication Sig Note   [EXPIRED] ciprofloxacin (CIPRO) 500 MG tablet Take 1 tablet (500 mg total) by mouth 2 (two) times daily for 10 days.    fluconazole (DIFLUCAN) 150 MG tablet Take one tab a day for 3 days    Semaglutide,0.25 or 0.5MG /DOS, (OZEMPIC, 0.25 OR 0.5 MG/DOSE,) 2 MG/3ML SOPN Use 0.5 mg Q week ( needles as well)    Adapalene-Benzoyl Peroxide 0.3-2.5 % GEL SMARTSIG:Topical Every Evening    ALPRAZolam (XANAX) 0.25 MG tablet Take 1 tablet (0.25 mg total) by mouth 2 (two) times daily as needed. for anxiety    cariprazine (VRAYLAR) 1.5 MG capsule Take 1 capsule (1.5 mg total) by mouth daily.    cholecalciferol (VITAMIN D3) 25 MCG (1000 UNIT) tablet Take 1,000 Units by mouth daily.    clindamycin (CLINDAGEL) 1 % gel SMARTSIG:Sparingly Topical Every Morning    Cyanocobalamin (VITAMIN B 12 PO) Take by mouth.    desvenlafaxine (PRISTIQ) 100 MG 24 hr tablet Take 1 tablet (100 mg total) by mouth every morning.    desvenlafaxine (PRISTIQ) 50 MG 24 hr tablet Take 1 tablet (50 mg total) by mouth at bedtime.    Dextromethorphan-buPROPion ER (AUVELITY) 45-105 MG TBCR Take 45-105 mg by mouth 2 (two) times daily.    hydrOXYzine (VISTARIL) 50 MG capsule Take 1 capsule by mouth three times daily as needed    lamoTRIgine (LAMICTAL) 150 MG tablet Take 1 tab daily for 2 weeks, then increase to 1 tab BID    lurasidone (LATUDA) 80 MG TABS tablet Take 1 tablet (80 mg total) by mouth  daily with supper.    mometasone (ELOCON) 0.1 % cream Apply topically 2 (two) times daily.    omeprazole (PRILOSEC) 40 MG capsule Take 1 capsule (40 mg total) by mouth daily. (Patient taking differently: Take 40 mg by mouth daily as needed.) 03/21/2021: Prescribed protonix    oseltamivir (TAMIFLU) 75 MG capsule Take 1 capsule (75 mg total) by mouth 2 (two) times daily.    Oxcarbazepine (TRILEPTAL) 300 MG tablet Take 1 tablet (300 mg total) by mouth 2 (two) times daily.    propranolol (INDERAL) 20 MG tablet Take 1 tablet (20 mg total) by mouth 3 (three) times daily as needed.    spironolactone (ALDACTONE) 100 MG tablet Take 100 mg by mouth daily.    [DISCONTINUED] levothyroxine (SYNTHROID) 25 MCG tablet Take 1 tablet (25 mcg total) by mouth daily before breakfast.    [DISCONTINUED] rosuvastatin (CRESTOR) 5 MG tablet Take 1 tablet by mouth once daily    [DISCONTINUED] RYBELSUS 14 MG TABS Take 1 tablet by mouth once daily    No facility-administered encounter medications on file as of 11/06/2022.    Surgical History: Past Surgical History:  Procedure Laterality Date   ABDOMINAL HYSTERECTOMY  2013   BARIATRIC SURGERY     BREAST BIOPSY  1995   COLONOSCOPY WITH PROPOFOL N/A 10/28/2020   Procedure: COLONOSCOPY WITH PROPOFOL;  Surgeon: Jonathon Bellows, MD;  Location: [facility] ENDOSCOPY;  Service: Gastroenterology;  Laterality: N/A;   GASTRIC BYPASS  2012   TUBAL LIGATION  1996   tummy tuck  05/27/2020    Medical History: Past Medical History:  Diagnosis Date   Anxiety    Depression    Diabetes mellitus without complication ([place])    Gastric ulcer    GERD (gastroesophageal reflux disease)    Hypertension    Osteoarthritis    Thyroid disorder     Family History: Family History  Problem Relation Age of Onset   Breast cancer Maternal Grandmother    Diabetes Maternal Grandmother    Heart disease Maternal Grandmother    Hypertension Maternal Grandmother    Heart disease Brother    Hypertension  Brother    Hyperlipidemia Brother    Diabetes Maternal Aunt    Heart disease Maternal Aunt    Hypertension Maternal Aunt    Hyperlipidemia Maternal Aunt    Diabetes Maternal Aunt    Heart disease Maternal Aunt    Hypertension Maternal Aunt    Hyperlipidemia Maternal Aunt    Diabetes Maternal Aunt    Heart disease Maternal Aunt    Hypertension Maternal Aunt    Hyperlipidemia Maternal Aunt     Social History   Socioeconomic History   Marital status: Single    Spouse name: Not on file   Number of children: 1   Years of education: Not on file   Highest education level: Not on file  Occupational History   Occupation: Engineer, drilling  Tobacco Use   Smoking status: Never   Smokeless tobacco: Never  Vaping Use   Vaping Use: Never used  Substance and Sexual Activity   Alcohol use: No    Alcohol/week: 0.0 standard drinks of alcohol   Drug use: No   Sexual activity: Yes    Birth control/protection: Surgical  Other Topics Concern   Not on file  Social History Narrative   Not on file   Social Determinants of Health   Financial Resource Strain: Not on file  Food Insecurity: No Food Insecurity (08/22/2020)   Hunger Vital Sign    Worried About Running Out of Food in the Last Year: Never true    Ran Out of Food in the Last Year: Never true  Transportation Needs: No Transportation Needs (08/22/2020)   PRAPARE - Hydrologist (Medical): No    Lack of Transportation (Non-Medical): No  Physical Activity: Not on file  Stress: Not on file  Social Connections: Not on file  Intimate Partner Violence: Not on file      Review of Systems  Constitutional:  Negative for fatigue and fever.  HENT:  Negative for congestion, mouth sores and postnasal drip.   Respiratory:  Negative for cough.   Cardiovascular:  Negative for chest pain.  Genitourinary:  Positive for dysuria. Negative for flank pain.  Psychiatric/Behavioral: Negative.      Vital Signs: BP  139/86   Pulse 98   Temp 98 F (36.7 C)   Resp 16   Ht 5\' 7"  (1.702 m)   Wt 261 lb 9.6 oz (118.7 kg)   SpO2 95%   BMI 40.97 kg/m    Physical Exam Constitutional:      Appearance: Normal appearance.  HENT:     Head: Normocephalic and atraumatic.     Nose: Nose normal.     Mouth/Throat:     Mouth: Mucous membranes are moist.  Pharynx: No posterior oropharyngeal erythema.  Eyes:     Extraocular Movements: Extraocular movements intact.     Pupils: Pupils are equal, round, and reactive to light.  Cardiovascular:     Pulses: Normal pulses.     Heart sounds: Normal heart sounds.  Pulmonary:     Effort: Pulmonary effort is normal.     Breath sounds: Normal breath sounds.  Neurological:     General: No focal deficit present.     Mental Status: She is alert.  Psychiatric:        Mood and Affect: Mood normal.        Behavior: Behavior normal.       Assessment/Plan: 1. Dysuria Will go ahead and treat with Cipro until C/S is available  - POCT Urinalysis Dipstick - CULTURE, URINE COMPREHENSIVE - ciprofloxacin (CIPRO) 500 MG tablet; Take 1 tablet (500 mg total) by mouth 2 (two) times daily for 10 days.  Dispense: 14 tablet; Refill: 0  2. Yeast infection involving the vagina and surrounding area Prophylactic RX is given for future use  - fluconazole (DIFLUCAN) 150 MG tablet; Take one tab a day for 3 days  Dispense: 3 tablet; Refill: 0  3. Type 2 diabetes mellitus with hyperglycemia, without long-term current use of insulin (HCC) Restart Ozempic, titrate as tolerated  - Semaglutide,0.25 or 0.5MG /DOS, (OZEMPIC, 0.25 OR 0.5 MG/DOSE,) 2 MG/3ML SOPN; Use 0.5 mg Q week ( needles as well)  Dispense: 3 mL; Refill: 3    General Counseling: Bethann verbalizes understanding of the findings of todays visit and agrees with plan of treatment. I have discussed any further diagnostic evaluation that may be needed or ordered today. We also reviewed her medications today. she has been  encouraged to call the office with any questions or concerns that should arise related to todays visit.    Orders Placed This Encounter  Procedures   CULTURE, URINE COMPREHENSIVE   POCT Urinalysis Dipstick    Meds ordered this encounter  Medications   Semaglutide,0.25 or 0.5MG /DOS, (OZEMPIC, 0.25 OR 0.5 MG/DOSE,) 2 MG/3ML SOPN    Sig: Use 0.5 mg Q week ( needles as well)    Dispense:  3 mL    Refill:  3   ciprofloxacin (CIPRO) 500 MG tablet    Sig: Take 1 tablet (500 mg total) by mouth 2 (two) times daily for 10 days.    Dispense:  14 tablet    Refill:  0   fluconazole (DIFLUCAN) 150 MG tablet    Sig: Take one tab a day for 3 days    Dispense:  3 tablet    Refill:  0    Total time spent:30 Minutes Time spent includes review of chart, medications, test results, and follow up plan with the patient.    Controlled Substance Database was reviewed by me.   Dr Lavera Guise Internal medicine

## 2022-11-06 NOTE — Telephone Encounter (Signed)
Work note faxed to [employer] per patient's request; [phone]

## 2022-11-07 ENCOUNTER — Ambulatory Visit: Payer: 59 | Admitting: Psychiatry

## 2022-11-07 ENCOUNTER — Ambulatory Visit (INDEPENDENT_AMBULATORY_CARE_PROVIDER_SITE_OTHER): Payer: 59 | Admitting: Psychiatry

## 2022-11-07 DIAGNOSIS — F331 Major depressive disorder, recurrent, moderate: Secondary | ICD-10-CM | POA: Diagnosis not present

## 2022-11-08 DIAGNOSIS — F331 Major depressive disorder, recurrent, moderate: Secondary | ICD-10-CM | POA: Diagnosis not present

## 2022-11-09 DIAGNOSIS — F331 Major depressive disorder, recurrent, moderate: Secondary | ICD-10-CM | POA: Diagnosis not present

## 2022-11-09 LAB — CULTURE, URINE COMPREHENSIVE

## 2022-11-12 ENCOUNTER — Encounter: Payer: Self-pay | Admitting: Physician Assistant

## 2022-11-12 ENCOUNTER — Ambulatory Visit: Payer: 59 | Admitting: Physician Assistant

## 2022-11-12 VITALS — BP 129/77 | HR 72 | Temp 98.5°F | Resp 16 | Ht 67.0 in | Wt 262.4 lb

## 2022-11-12 DIAGNOSIS — K8020 Calculus of gallbladder without cholecystitis without obstruction: Secondary | ICD-10-CM

## 2022-11-12 DIAGNOSIS — R1084 Generalized abdominal pain: Secondary | ICD-10-CM | POA: Diagnosis not present

## 2022-11-12 DIAGNOSIS — E782 Mixed hyperlipidemia: Secondary | ICD-10-CM | POA: Diagnosis not present

## 2022-11-12 DIAGNOSIS — K219 Gastro-esophageal reflux disease without esophagitis: Secondary | ICD-10-CM

## 2022-11-12 DIAGNOSIS — E039 Hypothyroidism, unspecified: Secondary | ICD-10-CM | POA: Diagnosis not present

## 2022-11-12 MED ORDER — ROSUVASTATIN CALCIUM 5 MG PO TABS: 5.0000 mg | ORAL_TABLET | Freq: Every day | ORAL | 5 refills | Status: DC

## 2022-11-12 MED ORDER — LEVOTHYROXINE SODIUM 25 MCG PO TABS: 25.0000 ug | ORAL_TABLET | Freq: Every day | ORAL | 1 refills | Status: DC

## 2022-11-12 NOTE — Progress Notes (Signed)
[facility] [place], [place] [address]  Internal MEDICINE  Office Visit Note  Patient Name: Ashley Stephens  [id number]  [id number]  Date of Service: 11/21/2022  Chief Complaint  Patient presents with   Follow-up   Depression   Diabetes   Gastroesophageal Reflux   Hypertension   Quality Metric Gaps    Eye Exam   Medication Refill    Synthroid, Crestor    HPI Pt is here for routine follow up -was told in hospital she may need HIDA scan if continued concern for acute cholecystitis, Did have stone on [place], but no acute cholecystitis on imaging. Determined to have viral gastroenteritis -She is feeling a little better, but does still have more epigastric pain. 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 She has been belching more. Will go ahead and refer to GI for further testing -Taking 100mg  spironolactone twice per day by dermatology. She does have right side breast tenderness and is urinating a lot. Will decrease back down to once per day.  -plan to start on ozempic once feeling better instead of rybelsus  Current Medication: Outpatient Encounter Medications as of 11/12/2022  Medication Sig Note   Adapalene-Benzoyl Peroxide 0.3-2.5 % GEL SMARTSIG:Topical Every Evening    ALPRAZolam (XANAX) 0.25 MG tablet Take 1 tablet (0.25 mg total) by mouth 2 (two) times daily as needed. for anxiety    cariprazine (VRAYLAR) 1.5 MG capsule Take 1 capsule (1.5 mg total) by mouth daily.    cholecalciferol (VITAMIN D3) 25 MCG (1000 UNIT) tablet Take 1,000 Units by mouth daily.    [EXPIRED] ciprofloxacin (CIPRO) 500 MG tablet Take 1 tablet (500 mg total) by mouth 2 (two) times daily for 10 days.    clindamycin (CLINDAGEL) 1 % gel SMARTSIG:Sparingly Topical Every Morning    Cyanocobalamin (VITAMIN B 12 PO) Take by mouth.    desvenlafaxine (PRISTIQ) 100 MG 24 hr tablet Take 1 tablet (100 mg total) by mouth every morning.    desvenlafaxine  (PRISTIQ) 50 MG 24 hr tablet Take 1 tablet (50 mg total) by mouth at bedtime.    fluconazole (DIFLUCAN) 150 MG tablet Take one tab a day for 3 days    hydrOXYzine (VISTARIL) 50 MG capsule Take 1 capsule by mouth three times daily as needed    lamoTRIgine (LAMICTAL) 150 MG tablet Take 1 tab daily for 2 weeks, then increase to 1 tab BID    lurasidone (LATUDA) 80 MG TABS tablet Take 1 tablet (80 mg total) by mouth daily with supper.    mometasone (ELOCON) 0.1 % cream Apply topically 2 (two) times daily.    omeprazole (PRILOSEC) 40 MG capsule Take 1 capsule (40 mg total) by mouth daily. (Patient taking differently: Take 40 mg by mouth daily as needed.) 03/21/2021: Prescribed protonix    oseltamivir (TAMIFLU) 75 MG capsule Take 1 capsule (75 mg total) by mouth 2 (two) times daily.    Oxcarbazepine (TRILEPTAL) 300 MG tablet Take 1 tablet (300 mg total) by mouth 2 (two) times daily.    propranolol (INDERAL) 20 MG tablet Take 1 tablet (20 mg total) by mouth 3 (three) times daily as needed.    Semaglutide,0.25 or 0.5MG /DOS, (OZEMPIC, 0.25 OR 0.5 MG/DOSE,) 2 MG/3ML SOPN Use 0.5 mg Q week ( needles as well)    spironolactone (ALDACTONE) 100 MG tablet Take 100 mg by mouth daily.    [DISCONTINUED] levothyroxine (SYNTHROID) 25 MCG tablet Take 1 tablet (25 mcg  total) by mouth daily before breakfast.    [DISCONTINUED] rosuvastatin (CRESTOR) 5 MG tablet Take 1 tablet by mouth once daily    Dextromethorphan-buPROPion ER (AUVELITY) 45-105 MG TBCR Take 45-105 mg by mouth 2 (two) times daily.    levothyroxine (SYNTHROID) 25 MCG tablet Take 1 tablet (25 mcg total) by mouth daily before breakfast.    rosuvastatin (CRESTOR) 5 MG tablet Take 1 tablet (5 mg total) by mouth daily.    No facility-administered encounter medications on file as of 11/12/2022.    Surgical History: Past Surgical History:  Procedure Laterality Date   ABDOMINAL HYSTERECTOMY  2013   BARIATRIC SURGERY     BREAST BIOPSY  1995   COLONOSCOPY WITH  PROPOFOL N/A 10/28/2020   Procedure: COLONOSCOPY WITH PROPOFOL;  Surgeon: Jonathon Bellows, MD;  Location: [facility] ENDOSCOPY;  Service: Gastroenterology;  Laterality: N/A;   GASTRIC BYPASS  2012   TUBAL LIGATION  1996   tummy tuck  05/27/2020    Medical History: Past Medical History:  Diagnosis Date   Anxiety    Depression    Diabetes mellitus without complication ([place])    Gastric ulcer    GERD (gastroesophageal reflux disease)    Hypertension    Osteoarthritis    Thyroid disorder     Family History: Family History  Problem Relation Age of Onset   Breast cancer Maternal Grandmother    Diabetes Maternal Grandmother    Heart disease Maternal Grandmother    Hypertension Maternal Grandmother    Heart disease Brother    Hypertension Brother    Hyperlipidemia Brother    Diabetes Maternal Aunt    Heart disease Maternal Aunt    Hypertension Maternal Aunt    Hyperlipidemia Maternal Aunt    Diabetes Maternal Aunt    Heart disease Maternal Aunt    Hypertension Maternal Aunt    Hyperlipidemia Maternal Aunt    Diabetes Maternal Aunt    Heart disease Maternal Aunt    Hypertension Maternal Aunt    Hyperlipidemia Maternal Aunt     Social History   Socioeconomic History   Marital status: Single    Spouse name: Not on file   Number of children: 1   Years of education: Not on file   Highest education level: Not on file  Occupational History   Occupation: Engineer, drilling  Tobacco Use   Smoking status: Never   Smokeless tobacco: Never  Vaping Use   Vaping Use: Never used  Substance and Sexual Activity   Alcohol use: No    Alcohol/week: 0.0 standard drinks of alcohol   Drug use: No   Sexual activity: Yes    Birth control/protection: Surgical  Other Topics Concern   Not on file  Social History Narrative   Not on file   Social Determinants of Health   Financial Resource Strain: Not on file  Food Insecurity: No Food Insecurity (08/22/2020)   Hunger Vital Sign    Worried  About Running Out of Food in the Last Year: Never true    Ran Out of Food in the Last Year: Never true  Transportation Needs: No Transportation Needs (08/22/2020)   PRAPARE - Hydrologist (Medical): No    Lack of Transportation (Non-Medical): No  Physical Activity: Not on file  Stress: Not on file  Social Connections: Not on file  Intimate Partner Violence: Not on file      Review of Systems  Constitutional:  Positive for appetite change and fatigue. Negative for  chills and unexpected weight change.  HENT:  Negative for congestion, rhinorrhea, sneezing and sore throat.   Eyes:  Negative for redness.  Respiratory:  Negative for cough, chest tightness and shortness of breath.   Cardiovascular:  Negative for chest pain and palpitations.  Gastrointestinal:  Positive for abdominal pain and nausea. Negative for blood in stool and constipation.  Genitourinary:  Positive for frequency. Negative for difficulty urinating, dysuria and urgency.  Musculoskeletal:  Negative for arthralgias, back pain, joint swelling and neck pain.  Skin:  Negative for rash.  Neurological:  Negative for tremors and numbness.  Hematological:  Negative for adenopathy. Does not bruise/bleed easily.  Psychiatric/Behavioral:  Negative for behavioral problems (Depression), sleep disturbance and suicidal ideas. The patient is nervous/anxious.     Vital Signs: BP 129/77   Pulse 72   Temp 98.5 F (36.9 C)   Resp 16   Ht 5\' 7"  (1.702 m)   Wt 262 lb 6.4 oz (119 kg)   SpO2 97%   BMI 41.10 kg/m    Physical Exam Vitals reviewed.  Constitutional:      Appearance: Normal appearance. She is obese.  HENT:     Head: Normocephalic and atraumatic.  Eyes:     Pupils: Pupils are equal, round, and reactive to light.  Cardiovascular:     Rate and Rhythm: Normal rate and regular rhythm.  Pulmonary:     Effort: Pulmonary effort is normal. No respiratory distress.  Abdominal:     General:  There is no distension.     Tenderness: There is abdominal tenderness.     Comments: Some epigastric tenderness still  Musculoskeletal:        General: Normal range of motion.  Skin:    General: Skin is warm.  Neurological:     Mental Status: She is alert and oriented to person, place, and time.  Psychiatric:        Mood and Affect: Mood normal.        Behavior: Behavior normal.        Assessment/Plan: 1. Generalized abdominal pain Mainly epigastric, possible concern for ulcer/H.Pylori with reflux and belching on omeprazole already. Will refer to GI for further investigation. Advised to call or go to ED if acute worsening occurs. - Ambulatory referral to Gastroenterology  2. Calculus of gallbladder without cholecystitis without obstruction May need HIDA or GS referral if increased concern for acute cholecystitis, however pain is more epigastric and recent imaging only showed stone present. - Ambulatory referral to Gastroenterology  3. Gastroesophageal reflux disease without esophagitis - Ambulatory referral to Gastroenterology  4. Acquired hypothyroidism - levothyroxine (SYNTHROID) 25 MCG tablet; Take 1 tablet (25 mcg total) by mouth daily before breakfast.  Dispense: 90 tablet; Refill: 1  5. Mixed hyperlipidemia - rosuvastatin (CRESTOR) 5 MG tablet; Take 1 tablet (5 mg total) by mouth daily.  Dispense: 30 tablet; Refill: 5   General Counseling: Harveen verbalizes understanding of the findings of todays visit and agrees with plan of treatment. I have discussed any further diagnostic evaluation that may be needed or ordered today. We also reviewed her medications today. she has been encouraged to call the office with any questions or concerns that should arise related to todays visit.    Orders Placed This Encounter  Procedures   Ambulatory referral to Gastroenterology    Meds ordered this encounter  Medications   levothyroxine (SYNTHROID) 25 MCG tablet    Sig: Take 1  tablet (25 mcg total) by mouth daily before breakfast.  Dispense:  90 tablet    Refill:  1   rosuvastatin (CRESTOR) 5 MG tablet    Sig: Take 1 tablet (5 mg total) by mouth daily.    Dispense:  30 tablet    Refill:  5    This patient was seen by Drema Dallas, PA-C in collaboration with Dr. Clayborn Bigness as a part of collaborative care agreement.   Total time spent:30 Minutes Time spent includes review of chart, medications, test results, and follow up plan with the patient.      Dr Lavera Guise Internal medicine

## 2022-11-13 ENCOUNTER — Telehealth: Payer: Self-pay | Admitting: Physician Assistant

## 2022-11-13 DIAGNOSIS — F331 Major depressive disorder, recurrent, moderate: Secondary | ICD-10-CM | POA: Diagnosis not present

## 2022-11-13 NOTE — Telephone Encounter (Signed)
Awaiting 11/12/22 office notes for GI referral-Toni

## 2022-11-14 DIAGNOSIS — F331 Major depressive disorder, recurrent, moderate: Secondary | ICD-10-CM | POA: Diagnosis not present

## 2022-11-15 DIAGNOSIS — F331 Major depressive disorder, recurrent, moderate: Secondary | ICD-10-CM | POA: Diagnosis not present

## 2022-11-16 DIAGNOSIS — R1013 Epigastric pain: Secondary | ICD-10-CM | POA: Diagnosis not present

## 2022-11-16 DIAGNOSIS — Z6841 Body Mass Index (BMI) 40.0 and over, adult: Secondary | ICD-10-CM | POA: Diagnosis not present

## 2022-11-20 DIAGNOSIS — F331 Major depressive disorder, recurrent, moderate: Secondary | ICD-10-CM | POA: Diagnosis not present

## 2022-11-21 DIAGNOSIS — F331 Major depressive disorder, recurrent, moderate: Secondary | ICD-10-CM | POA: Diagnosis not present

## 2022-11-21 NOTE — Progress Notes (Signed)
Pt was seen for sick visit

## 2022-11-22 DIAGNOSIS — F331 Major depressive disorder, recurrent, moderate: Secondary | ICD-10-CM | POA: Diagnosis not present

## 2022-11-23 DIAGNOSIS — F331 Major depressive disorder, recurrent, moderate: Secondary | ICD-10-CM | POA: Diagnosis not present

## 2022-11-26 ENCOUNTER — Telehealth: Payer: Self-pay | Admitting: Physician Assistant

## 2022-11-26 DIAGNOSIS — F331 Major depressive disorder, recurrent, moderate: Secondary | ICD-10-CM | POA: Diagnosis not present

## 2022-11-26 NOTE — Telephone Encounter (Signed)
Gastroenterology referral faxed to [facility] ; [phone]

## 2022-11-27 DIAGNOSIS — F331 Major depressive disorder, recurrent, moderate: Secondary | ICD-10-CM | POA: Diagnosis not present

## 2022-11-28 DIAGNOSIS — F331 Major depressive disorder, recurrent, moderate: Secondary | ICD-10-CM | POA: Diagnosis not present

## 2022-11-30 DIAGNOSIS — F331 Major depressive disorder, recurrent, moderate: Secondary | ICD-10-CM | POA: Diagnosis not present

## 2022-12-04 DIAGNOSIS — F331 Major depressive disorder, recurrent, moderate: Secondary | ICD-10-CM | POA: Diagnosis not present

## 2022-12-05 DIAGNOSIS — F331 Major depressive disorder, recurrent, moderate: Secondary | ICD-10-CM | POA: Diagnosis not present

## 2022-12-06 DIAGNOSIS — F331 Major depressive disorder, recurrent, moderate: Secondary | ICD-10-CM | POA: Diagnosis not present

## 2022-12-07 DIAGNOSIS — F331 Major depressive disorder, recurrent, moderate: Secondary | ICD-10-CM | POA: Diagnosis not present

## 2022-12-10 DIAGNOSIS — F331 Major depressive disorder, recurrent, moderate: Secondary | ICD-10-CM | POA: Diagnosis not present

## 2022-12-11 ENCOUNTER — Telehealth: Payer: Self-pay | Admitting: Physician Assistant

## 2022-12-11 DIAGNOSIS — F331 Major depressive disorder, recurrent, moderate: Secondary | ICD-10-CM | POA: Diagnosis not present

## 2022-12-11 NOTE — Telephone Encounter (Signed)
Lvm to schedule diabetic eye exam

## 2022-12-12 DIAGNOSIS — F331 Major depressive disorder, recurrent, moderate: Secondary | ICD-10-CM | POA: Diagnosis not present

## 2022-12-13 ENCOUNTER — Ambulatory Visit (INDEPENDENT_AMBULATORY_CARE_PROVIDER_SITE_OTHER): Payer: 59 | Admitting: Physician Assistant

## 2022-12-13 ENCOUNTER — Encounter: Payer: Self-pay | Admitting: Physician Assistant

## 2022-12-13 VITALS — BP 116/74 | HR 77 | Temp 98.3°F | Resp 16 | Ht 67.0 in | Wt 264.4 lb

## 2022-12-13 DIAGNOSIS — K219 Gastro-esophageal reflux disease without esophagitis: Secondary | ICD-10-CM | POA: Diagnosis not present

## 2022-12-13 DIAGNOSIS — F331 Major depressive disorder, recurrent, moderate: Secondary | ICD-10-CM | POA: Diagnosis not present

## 2022-12-13 DIAGNOSIS — E1165 Type 2 diabetes mellitus with hyperglycemia: Secondary | ICD-10-CM | POA: Diagnosis not present

## 2022-12-13 NOTE — Progress Notes (Signed)
[facility] [address] [place], [place] [address]  Internal MEDICINE  Office Visit Note  Patient Name: Ashley Stephens  [id number]  [id number]  Date of Service: 12/19/2022  Chief Complaint  Patient presents with   Follow-up   Depression   Diabetes   Gastroesophageal Reflux   Hypertension   Quality Metric Gaps    Eye and Foot Exam    HPI Pt is here for routine follow up -overall feeling better. Does have epigastric pain still intermittently and is waiting to hear from GI still. She may call their office to follow up on this -tolerating ozempic well no worsening of GI symptoms around this. Has taken 3 weeks worth. Will stay at current dose a little longer -BP still well controlled on lower dose of spironolactone now  Current Medication: Outpatient Encounter Medications as of 12/13/2022  Medication Sig Note   Adapalene-Benzoyl Peroxide 0.3-2.5 % GEL SMARTSIG:Topical Every Evening    ALPRAZolam (XANAX) 0.25 MG tablet Take 1 tablet (0.25 mg total) by mouth 2 (two) times daily as needed. for anxiety    cariprazine (VRAYLAR) 1.5 MG capsule Take 1 capsule (1.5 mg total) by mouth daily.    cholecalciferol (VITAMIN D3) 25 MCG (1000 UNIT) tablet Take 1,000 Units by mouth daily.    clindamycin (CLINDAGEL) 1 % gel SMARTSIG:Sparingly Topical Every Morning    Cyanocobalamin (VITAMIN B 12 PO) Take by mouth.    desvenlafaxine (PRISTIQ) 100 MG 24 hr tablet Take 1 tablet (100 mg total) by mouth every morning.    desvenlafaxine (PRISTIQ) 50 MG 24 hr tablet Take 1 tablet (50 mg total) by mouth at bedtime.    fluconazole (DIFLUCAN) 150 MG tablet Take one tab a day for 3 days    hydrOXYzine (VISTARIL) 50 MG capsule Take 1 capsule by mouth three times daily as needed    lamoTRIgine (LAMICTAL) 150 MG tablet Take 1 tab daily for 2 weeks, then increase to 1 tab BID    levothyroxine (SYNTHROID) 25 MCG tablet Take 1 tablet (25 mcg total) by mouth daily before breakfast.     lurasidone (LATUDA) 80 MG TABS tablet Take 1 tablet (80 mg total) by mouth daily with supper.    mometasone (ELOCON) 0.1 % cream Apply topically 2 (two) times daily.    omeprazole (PRILOSEC) 40 MG capsule Take 1 capsule (40 mg total) by mouth daily. (Patient taking differently: Take 40 mg by mouth daily as needed.) 03/21/2021: Prescribed protonix    oseltamivir (TAMIFLU) 75 MG capsule Take 1 capsule (75 mg total) by mouth 2 (two) times daily.    propranolol (INDERAL) 20 MG tablet Take 1 tablet (20 mg total) by mouth 3 (three) times daily as needed.    rosuvastatin (CRESTOR) 5 MG tablet Take 1 tablet (5 mg total) by mouth daily.    Semaglutide,0.25 or 0.5MG /DOS, (OZEMPIC, 0.25 OR 0.5 MG/DOSE,) 2 MG/3ML SOPN Use 0.5 mg Q week ( needles as well)    spironolactone (ALDACTONE) 100 MG tablet Take 100 mg by mouth daily.    Dextromethorphan-buPROPion ER (AUVELITY) 45-105 MG TBCR Take 45-105 mg by mouth 2 (two) times daily.    Oxcarbazepine (TRILEPTAL) 300 MG tablet Take 1 tablet (300 mg total) by mouth 2 (two) times daily.    No facility-administered encounter medications on file as of 12/13/2022.    Surgical History: Past Surgical History:  Procedure Laterality Date   ABDOMINAL HYSTERECTOMY  2013   BARIATRIC SURGERY     BREAST BIOPSY  1995  COLONOSCOPY WITH PROPOFOL N/A 10/28/2020   Procedure: COLONOSCOPY WITH PROPOFOL;  Surgeon: Wyline Mood, MD;  Location: [facility] ENDOSCOPY;  Service: Gastroenterology;  Laterality: N/A;   GASTRIC BYPASS  2012   TUBAL LIGATION  1996   tummy tuck  05/27/2020    Medical History: Past Medical History:  Diagnosis Date   Anxiety    Depression    Diabetes mellitus without complication    Gastric ulcer    GERD (gastroesophageal reflux disease)    Hypertension    Osteoarthritis    Thyroid disorder     Family History: Family History  Problem Relation Age of Onset   Breast cancer Maternal Grandmother    Diabetes Maternal Grandmother    Heart disease Maternal  Grandmother    Hypertension Maternal Grandmother    Heart disease Brother    Hypertension Brother    Hyperlipidemia Brother    Diabetes Maternal Aunt    Heart disease Maternal Aunt    Hypertension Maternal Aunt    Hyperlipidemia Maternal Aunt    Diabetes Maternal Aunt    Heart disease Maternal Aunt    Hypertension Maternal Aunt    Hyperlipidemia Maternal Aunt    Diabetes Maternal Aunt    Heart disease Maternal Aunt    Hypertension Maternal Aunt    Hyperlipidemia Maternal Aunt     Social History   Socioeconomic History   Marital status: Single    Spouse name: Not on file   Number of children: 1   Years of education: Not on file   Highest education level: Not on file  Occupational History   Occupation: Education officer, community  Tobacco Use   Smoking status: Never   Smokeless tobacco: Never  Vaping Use   Vaping Use: Never used  Substance and Sexual Activity   Alcohol use: No    Alcohol/week: 0.0 standard drinks of alcohol   Drug use: No   Sexual activity: Yes    Birth control/protection: Surgical  Other Topics Concern   Not on file  Social History Narrative   Not on file   Social Determinants of Health   Financial Resource Strain: Not on file  Food Insecurity: No Food Insecurity (08/22/2020)   Hunger Vital Sign    Worried About Running Out of Food in the Last Year: Never true    Ran Out of Food in the Last Year: Never true  Transportation Needs: No Transportation Needs (08/22/2020)   PRAPARE - Administrator, Civil Service (Medical): No    Lack of Transportation (Non-Medical): No  Physical Activity: Not on file  Stress: Not on file  Social Connections: Not on file  Intimate Partner Violence: Not on file      Review of Systems  Constitutional:  Negative for chills, fatigue and unexpected weight change.  HENT:  Negative for congestion, rhinorrhea, sneezing and sore throat.   Eyes:  Negative for redness.  Respiratory:  Negative for cough, chest  tightness and shortness of breath.   Cardiovascular:  Negative for chest pain and palpitations.  Gastrointestinal:  Negative for abdominal pain, constipation, diarrhea, nausea and vomiting.       Intermittent upper abdominal pain with reflux  Genitourinary:  Negative for dysuria and frequency.  Musculoskeletal:  Negative for arthralgias, back pain, joint swelling and neck pain.  Skin:  Negative for rash.  Neurological: Negative.  Negative for tremors and numbness.  Hematological:  Negative for adenopathy. Does not bruise/bleed easily.  Psychiatric/Behavioral:  Negative for behavioral problems (Depression), sleep disturbance and  suicidal ideas. The patient is nervous/anxious.     Vital Signs: BP 116/74   Pulse 77   Temp 98.3 F (36.8 C)   Resp 16   Ht 5\' 7"  (1.702 m)   Wt 264 lb 6.4 oz (119.9 kg)   SpO2 98%   BMI 41.41 kg/m    Physical Exam Vitals and nursing note reviewed.  Constitutional:      General: She is not in acute distress.    Appearance: Normal appearance. She is obese. She is not ill-appearing.  HENT:     Head: Normocephalic and atraumatic.  Eyes:     Pupils: Pupils are equal, round, and reactive to light.  Cardiovascular:     Rate and Rhythm: Normal rate and regular rhythm.  Pulmonary:     Effort: Pulmonary effort is normal. No respiratory distress.  Musculoskeletal:        General: Normal range of motion.  Skin:    General: Skin is warm and dry.  Neurological:     Mental Status: She is alert and oriented to person, place, and time.  Psychiatric:        Mood and Affect: Mood normal.        Behavior: Behavior normal.        Assessment/Plan: 1. Type 2 diabetes mellitus with hyperglycemia, without long-term current use of insulin Tolerating ozempic well and will continue current dose while working on diet and exercise  2. Gastroesophageal reflux disease without esophagitis Continue omeprazole, will follow up on GI referral due to intermittent  abdominal pain with reflux   General Counseling: Shahrzad verbalizes understanding of the findings of todays visit and agrees with plan of treatment. I have discussed any further diagnostic evaluation that may be needed or ordered today. We also reviewed her medications today. she has been encouraged to call the office with any questions or concerns that should arise related to todays visit.    No orders of the defined types were placed in this encounter.   No orders of the defined types were placed in this encounter.   This patient was seen by Lynn ItoLauren Joon Pohle, PA-C in collaboration with Dr. Beverely RisenFozia Khan as a part of collaborative care agreement.   Total time spent:30 Minutes Time spent includes review of chart, medications, test results, and follow up plan with the patient.      Dr Lyndon CodeFozia M Khan Internal medicine

## 2022-12-14 DIAGNOSIS — F331 Major depressive disorder, recurrent, moderate: Secondary | ICD-10-CM | POA: Diagnosis not present

## 2022-12-17 DIAGNOSIS — F331 Major depressive disorder, recurrent, moderate: Secondary | ICD-10-CM | POA: Diagnosis not present

## 2022-12-18 DIAGNOSIS — F331 Major depressive disorder, recurrent, moderate: Secondary | ICD-10-CM | POA: Diagnosis not present

## 2022-12-19 DIAGNOSIS — F331 Major depressive disorder, recurrent, moderate: Secondary | ICD-10-CM | POA: Diagnosis not present

## 2022-12-20 DIAGNOSIS — F331 Major depressive disorder, recurrent, moderate: Secondary | ICD-10-CM | POA: Diagnosis not present

## 2022-12-21 DIAGNOSIS — F331 Major depressive disorder, recurrent, moderate: Secondary | ICD-10-CM | POA: Diagnosis not present

## 2023-01-09 ENCOUNTER — Encounter: Payer: Self-pay | Admitting: Psychiatry

## 2023-01-09 ENCOUNTER — Ambulatory Visit (INDEPENDENT_AMBULATORY_CARE_PROVIDER_SITE_OTHER): Payer: 59 | Admitting: Psychiatry

## 2023-01-09 DIAGNOSIS — F39 Unspecified mood [affective] disorder: Secondary | ICD-10-CM

## 2023-01-09 DIAGNOSIS — F5101 Primary insomnia: Secondary | ICD-10-CM | POA: Diagnosis not present

## 2023-01-09 DIAGNOSIS — F411 Generalized anxiety disorder: Secondary | ICD-10-CM

## 2023-01-09 MED ORDER — DESVENLAFAXINE SUCCINATE ER 50 MG PO TB24: 50.0000 mg | ORAL_TABLET | Freq: Every day | ORAL | 1 refills | Status: DC

## 2023-01-09 MED ORDER — DESVENLAFAXINE SUCCINATE ER 100 MG PO TB24: 100.0000 mg | ORAL_TABLET | ORAL | 1 refills | Status: DC

## 2023-01-09 MED ORDER — CARIPRAZINE HCL 3 MG PO CAPS: 3.0000 mg | ORAL_CAPSULE | Freq: Every day | ORAL | 1 refills | Status: DC

## 2023-01-09 MED ORDER — LAMOTRIGINE 150 MG PO TABS: 150.0000 mg | ORAL_TABLET | Freq: Two times a day (BID) | ORAL | 0 refills | Status: DC

## 2023-01-09 MED ORDER — AUVELITY 45-105 MG PO TBCR: 1.0000 | EXTENDED_RELEASE_TABLET | Freq: Two times a day (BID) | ORAL | 0 refills | Status: DC

## 2023-01-09 NOTE — Progress Notes (Signed)
Ashley Stephens [id number] 1975-12-20 47 y.o.  Subjective:   Patient ID:  Ashley Stephens is a 2 y.o. (DOB Sep 17, 1975) female.  Chief Complaint:  Chief Complaint  Patient presents with   Anxiety   Depression    HPI Ashley Stephens presents to the office today for follow-up of Bipolar Disorder, anxiety, and insomnia.   She was at [facility]. She reports, "it helped while I was there." She reports that she stopped her medication for a few days and,  "felt like I can do this." She reports worsening mood and anxiety symptoms and re-started medications several days later on or around 12/27/22. She reports that she re-started all of her medications in the morning since, "I had it in my head that if I took it that way it would work faster." She reports that she has continued to take most of her medications in the morning, to include the morning and evening doses. She reports that she was started on Hydroxyzine 10 mg tabs during PHP to take during the daytime for anxiety and was told to continue Hydroxyzine 25 mg at bedtime for sleep. She reports recently she has been taking "all of it," referring to three hydroxyzine 10 mg tabs in the morning and a few hours later taking 2-3 Hydroxyzine 25 mg capsules.   She reports, "I feel like I am having an out of body experience." She describes feelings of depersonalization and dissociation. She reports that she recently "just sat there" in her car for 30-45 minutes. She reports that she feels anxious. She reports that she had a panic attack last week when she learned that her cousin that she was very close with had died. She reports that she had turned off her phone the night he died for to try to help with self-care. She reports guilt related to not having her phone on when he died. She reports worsening depression. She reports some irritability. She reports poor energy and motivation. She reports periods of very low energy and  being easily fatigue. She reports poor sleep and is awakening at 3-4 am and unable to fall asleep.   Returned to work on 12/24/22- "I shouldn't have." She reports that PHP program discussed possibly waiting a week to transition from PHP to return to work, "but I didn't listen" and now regrets returning to work immediately after completing PHP. She reports that she "forgot" a procedure at work and this has caused her anxiety since it is unresolved. She reports that she has had catastrophic thoughts in response to this. She reports, "I had a meltdown and they called the police to my house" last week when employer felt that she was a danger to herself. She requests LOA from work.   She reports that she was taking Vraylar during PHP. She reports having some mood lability during PHP and is not sure if this was related to talking about difficult topics.   Reports taking all of her medications in the morning.   She reports having suicidal thoughts. Contracts for safety.   Daughter was accepted into [employer] program.   Past medication trials: Wellbutrin Effexor Pristiq-effective Auvelity Lamictal-effective for mood Latuda Rexulti Xanax- Usually takes at night Propanolol- Effective Hydroxyzine Gabapentin- prescribed for back pain. Dizziness, headaches. Lithium Trileptal  AIMS    Flowsheet Row Office Visit from 01/09/2023 in [facility] Video Visit from 08/30/2022 in [facility] Office Visit from 03/15/2022 in [facility] Office Visit from 10/20/2021 in [facility] Office Visit from 09/20/2021 in [facility]  AIMS Total Score 0 0 0 0 0      GAD-7    Flowsheet Row Office Visit from 12/25/2019 in [facility]  Total GAD-7 Score 16      PHQ2-9    Flowsheet Row Office Visit from 10/26/2022 in [facility] Office Visit from 01/25/2022 in [facility], [facility] Office Visit from 09/28/2021 in [facility], [facility] Office Visit from 06/29/2021 in [facility], [facility] Office Visit from 12/30/2020 in [facility], [facility]  PHQ-2 Total Score 3 0 0 0 0  PHQ-9 Total Score 12 -- -- -- --      Flowsheet Row ED from 10/22/2022 in [facility] at [facility] Admission (Discharged) from 10/28/2020 in [facility]  C-SSRS RISK CATEGORY No Risk No Risk        Review of Systems:  Review of Systems  Musculoskeletal:  Negative for gait problem.  Neurological:  Positive for headaches. Negative for tremors.  Psychiatric/Behavioral:         Please refer to HPI    Medications: I have reviewed the patient's current medications.  Current Outpatient Medications  Medication Sig Dispense Refill   ALPRAZolam (XANAX) 0.25 MG tablet Take 1 tablet (0.25 mg total) by mouth 2 (two) times daily as needed. for anxiety 45 tablet 5   hydrOXYzine (VISTARIL) 50 MG capsule Take 1 capsule by mouth three times daily as needed 270 capsule 1   omeprazole (PRILOSEC) 40 MG capsule Take 1 capsule (40 mg total) by mouth daily. (Patient taking differently: Take 40 mg by mouth daily as needed.) 30 capsule 3   propranolol (INDERAL) 20 MG tablet Take 1 tablet (20 mg total) by mouth 3 (three) times daily as needed. 270 tablet 1   rosuvastatin (CRESTOR) 5 MG tablet Take 1 tablet (5 mg total) by mouth daily. 30 tablet 5   Semaglutide,0.25 or 0.5MG /DOS, (OZEMPIC, 0.25 OR 0.5 MG/DOSE,) 2 MG/3ML SOPN Use 0.5 mg Q week ( needles as well) 3 mL 3   spironolactone (ALDACTONE) 100 MG tablet Take 100 mg by mouth 2 (two) times daily.     Adapalene-Benzoyl Peroxide 0.3-2.5 % GEL SMARTSIG:Topical Every Evening     cariprazine (VRAYLAR) 3 MG capsule Take 1 capsule (3 mg total) by mouth daily. 30 capsule 1   cholecalciferol (VITAMIN D3) 25 MCG (1000 UNIT) tablet  Take 1,000 Units by mouth daily. (Patient not taking: Reported on 01/09/2023)     clindamycin (CLINDAGEL) 1 % gel SMARTSIG:Sparingly Topical Every Morning     Cyanocobalamin (VITAMIN B 12 PO) Take by mouth. (Patient not taking: Reported on 01/09/2023)     desvenlafaxine (PRISTIQ) 100 MG 24 hr tablet Take 1 tablet (100 mg total) by mouth every morning. 90 tablet 1   desvenlafaxine (PRISTIQ) 50 MG 24 hr tablet Take 1 tablet (50 mg total) by mouth at bedtime. 90 tablet 1   Dextromethorphan-buPROPion ER (AUVELITY) 45-105 MG TBCR Take 1 tablet by mouth 2 (two) times daily. 180 tablet 0   lamoTRIgine (LAMICTAL) 150 MG tablet Take 1 tablet (150 mg total) by mouth 2 (two) times daily. 180 tablet 0   levothyroxine (SYNTHROID) 25 MCG tablet Take 1 tablet (25 mcg total) by mouth daily before breakfast. 90 tablet 1   mometasone (ELOCON) 0.1 % cream Apply topically 2 (two)  times daily.     Oxcarbazepine (TRILEPTAL) 300 MG tablet Take 1 tablet (300 mg total) by mouth 2 (two) times daily. 180 tablet 1   No current facility-administered medications for this visit.    Medication Side Effects: None  Allergies: No Known Allergies  Past Medical History:  Diagnosis Date   Anxiety    Depression    Diabetes mellitus without complication (HCC)    Gastric ulcer    GERD (gastroesophageal reflux disease)    Hypertension    Osteoarthritis    Thyroid disorder     Past Medical History, Surgical history, Social history, and Family history were reviewed and updated as appropriate.   Please see review of systems for further details on the patient's review from today.   Objective:   Physical Exam:  There were no vitals taken for this visit.  Physical Exam Constitutional:      General: She is not in acute distress. Musculoskeletal:        General: No deformity.  Neurological:     Mental Status: She is alert and oriented to person, place, and time.     Coordination: Coordination normal.  Psychiatric:         Attention and Perception: Attention and perception normal. She does not perceive auditory or visual hallucinations.        Mood and Affect: Mood is anxious and depressed. Affect is labile and tearful. Affect is not blunt, angry or inappropriate.        Speech: Speech normal.        Behavior: Behavior normal.        Thought Content: Thought content is not paranoid or delusional. Thought content includes suicidal ideation. Thought content does not include homicidal ideation. Thought content does not include homicidal or suicidal plan.        Cognition and Memory: Cognition and memory normal.        Judgment: Judgment normal.     Comments: Insight intact Contracts for safety     Lab Review:     Component Value Date/Time   NA 134 (L) 10/22/2022 1858   NA 142 01/25/2022 0947   NA 142 12/03/2011 1059   K 4.3 10/22/2022 1858   K 3.4 (L) 12/03/2011 1059   CL 101 10/22/2022 1858   CL 107 12/03/2011 1059   CO2 18 (L) 10/22/2022 1858   CO2 27 12/03/2011 1059   GLUCOSE 162 (H) 10/22/2022 1858   GLUCOSE 66 12/03/2011 1059   BUN 12 10/22/2022 1858   BUN 7 01/25/2022 0947   BUN 4 (L) 12/03/2011 1059   CREATININE 1.21 (H) 10/22/2022 1858   CREATININE 0.82 12/03/2011 1059   CALCIUM 10.5 (H) 10/22/2022 1858   CALCIUM 8.5 12/03/2011 1059   PROT 8.7 (H) 10/22/2022 1858   PROT 6.5 01/25/2022 0947   ALBUMIN 5.2 (H) 10/22/2022 1858   ALBUMIN 4.3 01/25/2022 0947   AST 26 10/22/2022 1858   ALT 22 10/22/2022 1858   ALKPHOS 90 10/22/2022 1858   BILITOT 0.3 10/22/2022 1858   BILITOT 0.2 01/25/2022 0947   GFRNONAA 55 (L) 10/22/2022 1858   GFRNONAA >60 12/03/2011 1059   GFRAA 78 07/10/2019 0953   GFRAA >60 12/03/2011 1059       Component Value Date/Time   WBC 10.3 10/22/2022 1858   RBC 5.80 (H) 10/22/2022 1858   HGB 17.5 (H) 10/22/2022 1858   HGB 14.6 01/25/2022 0947   HCT 50.0 (H) 10/22/2022 1858   HCT 42.0 01/25/2022 0947  PLT 374 10/22/2022 1858   PLT 294 01/25/2022 0947   MCV 86.2  10/22/2022 1858   MCV 87 01/25/2022 0947   MCH 30.2 10/22/2022 1858   MCHC 35.0 10/22/2022 1858   RDW 12.9 10/22/2022 1858   RDW 12.8 01/25/2022 0947   LYMPHSABS 1.7 10/22/2022 1858   LYMPHSABS 1.6 01/25/2022 0947   MONOABS 0.5 10/22/2022 1858   EOSABS 0.1 10/22/2022 1858   EOSABS 0.1 01/25/2022 0947   BASOSABS 0.0 10/22/2022 1858   BASOSABS 0.0 01/25/2022 0947    No results found for: "POCLITH", "LITHIUM"   No results found for: "PHENYTOIN", "PHENOBARB", "VALPROATE", "CBMZ"   .res Assessment: Plan:   Will increase Vraylar to 3 mg po qd to stabilize mood.  Discussed that high doses of Hydroxyzine  can cause confusion and depersonalization. Recommended discontinuing 10 mg tabs and taking 25 mg capsules three times daily as needed. Advised pt not to take more than 25 mg of Hydroxyzine at one time.  Recommend dividing doses of Auvelity by at least 8 hours to minimize side effects.  Recommend dividing doses of Oxcarbazepine to minimize side effects.  Continue Pristiq 150 mg po qd for depression and anxiety.  Continue Lamictal 150 mg po BID for mood stabilization.  Recommended resuming therapy and pt agrees that seeing therapist would be helpful. Pt assisted with scheduling apt to see therapist, Rockne Menghini, LCSW.  Recommend that pt remain out of work until 02/12/23.  Pt to follow-up with this provider in 4-6 weeks or sooner if clinically indicated.  Patient advised to contact office with any questions, adverse effects, or acute worsening in signs and symptoms.   I spent 45 minutes dedicated to the care of this patient on the date of this encounter to include pre-visit review of records, face-to-face time with the patient discussing treatment plan and partial hospitalization, ordering of medication, and post visit documentation.   Kalifa was seen today for anxiety and depression.  Diagnoses and all orders for this visit:  Mood disorder (HCC) -     cariprazine (VRAYLAR) 3 MG  capsule; Take 1 capsule (3 mg total) by mouth daily. -     desvenlafaxine (PRISTIQ) 100 MG 24 hr tablet; Take 1 tablet (100 mg total) by mouth every morning. -     desvenlafaxine (PRISTIQ) 50 MG 24 hr tablet; Take 1 tablet (50 mg total) by mouth at bedtime. -     Dextromethorphan-buPROPion ER (AUVELITY) 45-105 MG TBCR; Take 1 tablet by mouth 2 (two) times daily. -     lamoTRIgine (LAMICTAL) 150 MG tablet; Take 1 tablet (150 mg total) by mouth 2 (two) times daily.  Generalized anxiety disorder -     desvenlafaxine (PRISTIQ) 100 MG 24 hr tablet; Take 1 tablet (100 mg total) by mouth every morning. -     desvenlafaxine (PRISTIQ) 50 MG 24 hr tablet; Take 1 tablet (50 mg total) by mouth at bedtime.  Primary insomnia     Please see After Visit Summary for patient specific instructions.  Future Appointments  Date Time Provider Department Center  01/11/2023  8:00 AM Mathis Fare, LCSW [facility] None  01/23/2023  9:00 AM Mathis Fare, LCSW [facility] None  01/31/2023 11:00 AM McDonough, Salomon Fick, PA-C [facility] None  02/11/2023  2:15 PM Corie Chiquito, PMHNP [facility] None    No orders of the defined types were placed in this encounter.   -------------------------------

## 2023-01-10 ENCOUNTER — Telehealth: Payer: Self-pay | Admitting: Psychiatry

## 2023-01-10 NOTE — Telephone Encounter (Signed)
Received fax from [employer] for completion of FMLA form. Placed in Traci's box to complete.

## 2023-01-11 ENCOUNTER — Ambulatory Visit: Payer: 59 | Admitting: Psychiatry

## 2023-01-11 DIAGNOSIS — F39 Unspecified mood [affective] disorder: Secondary | ICD-10-CM | POA: Diagnosis not present

## 2023-01-11 NOTE — Progress Notes (Signed)
[facility] Counselor/Therapist Progress Note  Patient ID: Ashley Stephens, MRN: [id number],    Date: 01/11/2023  Time Spent: 55 minutes   Treatment Type: Individual Therapy  Reported Symptoms: depression, anxiety, avoidance of other people, denies SI today  Mental Status Exam:  Appearance:   Casual     Behavior:  Appropriate, Sharing, and "don't have the energy to get better"  Motor:  Normal  Speech/Language:   Clear and Coherent  Affect:  Depressed and anxious  Mood:  anxious and depressed  Thought process:  goal directed  Thought content:    Obsessive thoughts at times  Sensory/Perceptual disturbances:    WNL  Orientation:  oriented to person, place, time/date, situation, day of week, month of year, year, and stated date of Jan 11, 2023  Attention:  Fair  Concentration:  Fair  Memory:  Some short term memory issues and states her Dr is aware  Fund of knowledge:   Good  Insight:    Fair  Judgment:   Fair  Impulse Control:  Good and Fair   Risk Assessment: Denies any SI Danger to Self:  No Self-injurious Behavior: No Danger to Others: No Duty to Warn:no Physical Aggression / Violence:No  Access to Firearms a concern: No  Gang Involvement:No   Subjective:   Patient in today reporting increased anxiety more recently as well as depression mostly related to "cousin's death, work and life stressors. Work stressors include "the work itself and dealing with clients and management". Life stressors include bills, family relationships. States she wants to talk to people and not be told what to do.  States most important thing today is to talk about herself and people "not being there for me."  Later states dad does check on her and show interest in how she is doing, they go out to dinner some, and has had some health issues more recently but we still check on each other. Doesn't feel she has good friends right now "but I'm ok with that right now because they come with  baggage." Has a 23yr old "god-child" outside my family and sees her every 2 weeks. Child's mother not a good friend to patient. Likes where she is living and feels safe but doesn't get very involved with neighbors. Talked through some frustrations personally and with work. Processed some of her treatment received at [facility] and concerns she had being there. Communication issues and difficulty committing to changes even though they could be positive for her. Trust issues with a lot people. "Out of work til approximately [employer]".  Encouraged her to work on decreasing negative thoughts and overthinking, getting out of her apt more, trying to make contacts with people who can be supportive, and refrain from hibernating an apartment or other places away from people.  Motivation seems some better by end of session.  Interventions: Cognitive Behavioral Therapy and Ego-Supportive  Long term goal: Develop the ability to recognize, accept, and cope with feelings of depression and anxiety. Short term goal: Learn and implement personal skills for managing stress, solving daily problems, and resolving conflicts effectively. Strategy: Teach patient calming strategies, problem solving skills, and conflict resolution skills to better manage daily stressors  Diagnosis:   ICD-10-CM   1. Mood disorder (HCC)  F39      Plan:   Patient participating well in session today focusing on her anxiety and depression, along with multiple family issues and her lack of healthy connections.  She reported little motivation at  beginning of session but was able to be engaged some and seemed to have more motivation by end of session.  Felt that she made some gains in session today and we will plan to build on this in upcoming sessions as patient needs to be committed and work with goal-directed behaviors in order to progress. Encouraged patient and practicing positive and self affirming behaviors including: Realizing more that and  assuming the negatives this feeds her anxiety and depression, understand better how certain things impact her mood negatively and ways of coping more effectively as discussed in session, reflected more on positives even small ones, consider alternative ways of looking at herself and her future that could offer more hope, getting outside daily, reduce self negating, practice more positive self-care and self talk daily, be willing to practice some of the more productive ways of interacting with others as discussed if you herself in more positive ways, believing herself and her self talk and attitude when it goes in a negative direction and tried to interrupt it bringing it towards a more positive/encouraging direction, practice self calming strategies, look more for what might go right versus wrong, remain in contact with a friend that she has said previously is supportive of her, stay in the present focusing on what she can change or control, continue to work on past hurts and resentments as they arise in the present, and realize the strengths she shows when working with goal-directed behaviors to move in a direction that supports her approved emotional health and overall wellbeing.   Goal review and progress/challenges noted with patient.  Next appt within 2-3 weeks.   Mathis Fare, LCSW

## 2023-01-18 ENCOUNTER — Telehealth: Payer: Self-pay | Admitting: Physician Assistant

## 2023-01-18 NOTE — Telephone Encounter (Signed)
Lvm to schedule diabetic eye exam-Toni 

## 2023-01-22 DIAGNOSIS — Z0289 Encounter for other administrative examinations: Secondary | ICD-10-CM

## 2023-01-22 NOTE — Telephone Encounter (Signed)
Working on both of her forms to get faxed

## 2023-01-23 ENCOUNTER — Ambulatory Visit: Payer: 59 | Admitting: Psychiatry

## 2023-01-23 ENCOUNTER — Telehealth: Payer: Self-pay | Admitting: Physician Assistant

## 2023-01-23 DIAGNOSIS — F39 Unspecified mood [affective] disorder: Secondary | ICD-10-CM | POA: Diagnosis not present

## 2023-01-23 NOTE — Telephone Encounter (Signed)
Lvm and sent message to schedule diabetic eye exam-Toni

## 2023-01-23 NOTE — Telephone Encounter (Signed)
All forms have been faxed. 14 pages to [employer]

## 2023-01-23 NOTE — Progress Notes (Signed)
[facility] Counselor/Therapist Progress Note  Patient ID: Ashley Stephens, MRN: [id number],    Date: 01/23/2023  Time Spent: 45 minutes   Treatment Type: Individual Therapy  Reported Symptoms: anxiety, overwhelmed "like the world is coming to an end" because cousin and a lot of people I know are dying" , denies any SI  Mental Status Exam:  Appearance:   Casual     Behavior:  Sharing and Motivated but "not sure I'll get better"  Motor:  Normal  Speech/Language:   Clear and Coherent  Affect:  Depressed and anxious  Mood:  anxious and depressed  Thought process:  goal directed  Thought content:    Some obsessive thoughts, no SI per her report  Sensory/Perceptual disturbances:    WNL  Orientation:  oriented to person, place, time/date, situation, day of week, month of year, year, and stated date of Jan 23, 2023  Attention:  Fair  Concentration:  Good  Memory:  WNL  Fund of knowledge:   Good  Insight:    Fair  Judgment:   Good  Impulse Control:  Good   Risk Assessment: Danger to Self:  No Self-injurious Behavior: No Danger to Others: No Duty to Warn:no Physical Aggression / Violence:No  Access to Firearms a concern: No  Gang Involvement:No   Subjective:   Patient in today reporting anxiety, depression, and feeling overwhelmed "like the world is coming to an end because my cousin and others I know are dying", patient denies any current SI. States "Mother's Day" was not good. Daughter visited her and ate lunch but no Mother's Day gift for patient, which was sad for her. "Then next day was my cousin's funeral". Currently out of work and spending her time "on the couch watching TV and my phone" or I sleep.  Assisted patient in trying to get more motivated and follow through on some goal-directed behaviors that would get her outside some, have more of a sense of hopefulness, not label herself and derogatory ways, and initiate some motivation.  She did respond some to this  and the end result was she committed to getting outside of her apartment at least once daily, walking some outside, consider going to a park where she can be around other people but not have to be interacting with them necessarily.  Patient agreed with this and is to return within 1 to 2 weeks.  Trust issues with other people continues.  Overthinking.  Some decrease in negative thoughts.  Really encouraged her to stop hibernating in her apartment and to get outside and places where she would feel comfortable even for smaller amounts of time, and perhaps arrange a visit with her 49-year-old "godchild" who she typically sees every 2 weeks.  Did seem a little more hopeful by end of session and continues to deny any SI.  Interventions: Cognitive Behavioral Therapy and Ego-Supportive  Long term goal: Develop the ability to recognize, accept, and cope with feelings of depression and anxiety. Short term goal: Learn and implement personal skills for managing stress, solving daily problems, and resolving conflicts effectively. Strategy: Teach patient calming strategies, problem solving skills, and conflict resolution skills to better manage daily stressors   Diagnosis:   ICD-10-CM   1. Mood disorder (HCC)  F39      Plan: Patient showing good participation in session today as she worked further on her anxiety, depression, and feeling overwhelmed as noted above.  Did well and talking through her stress that was very  obvious as she walked in the door today and was calmer upon leaving.  Negative self talk which eventually challenged and she seemed to understand the connection of how talking to herself very negative is just feeding the feelings of depression, anxiety, and frustration.  Worked with her on reframing some of her thoughts and assumptions, with the intention of getting patient to see some "possibilities" versus feeling nothing is going to help her.  Did seem to feel heard today and left feeling more  supported.  Continues to deny any SI.  Encouraged patient in her practice of positive and self affirming behaviors as noted in session including: Realizing more that in assuming the negatives this feeds her anxiety and depression, understand better how certain things impact her mood negatively and ways of coping more effectively as discussed in session, reflect more on positives even the small ones, consider alternative ways of looking at herself and her future that could offer more hope, getting outside daily, reduce self negating, practice more positive self-care and self talk daily, be willing to practice some of the more productive ways of interacting with others as discussed, view herself in more positive ways, believing more in herself, practice self calming strategies, look more for what might go right versus wrong stay in contact with a friend that she has said previously is supportive of her, stay in the present focusing on what she can control or change, continue to work on past hurts and resentments as they arise in the present, and recognize the strength she shows when working with goal-directed behaviors to move in a direction that supports her improved emotional health and outlook.  Self rating scales: 1-10 depression scale-8 1-10 anxiety scale-8 1-10 motivation scale-5 1-10 self-esteem scale-4 1-10 hopefulness scale-4/5  Goal review and progress/challenges noted with patient.  Next appointment within 2 to 3 weeks.   Mathis Fare, LCSW

## 2023-01-24 ENCOUNTER — Telehealth: Payer: Self-pay | Admitting: Psychiatry

## 2023-01-24 NOTE — Telephone Encounter (Signed)
Pt left message asking Shanda Bumps to return call. [phone] Mentioned requesting doctor notes from her visits.

## 2023-01-24 NOTE — Telephone Encounter (Signed)
Please let her know that she has access to all of her notes in My Chart.

## 2023-01-25 NOTE — Telephone Encounter (Signed)
Called Pt LVM notes from visits on mychart.

## 2023-01-29 ENCOUNTER — Telehealth: Payer: Self-pay | Admitting: Psychiatry

## 2023-01-29 NOTE — Telephone Encounter (Signed)
Received disability forms. Given to [facility] 5/24

## 2023-01-31 ENCOUNTER — Other Ambulatory Visit: Payer: Self-pay | Admitting: Physician Assistant

## 2023-01-31 ENCOUNTER — Telehealth: Payer: Self-pay | Admitting: Physician Assistant

## 2023-01-31 ENCOUNTER — Ambulatory Visit (INDEPENDENT_AMBULATORY_CARE_PROVIDER_SITE_OTHER): Payer: 59 | Admitting: Physician Assistant

## 2023-01-31 ENCOUNTER — Encounter: Payer: Self-pay | Admitting: Physician Assistant

## 2023-01-31 VITALS — BP 128/80 | HR 96 | Temp 97.8°F | Resp 16 | Ht 67.0 in | Wt 256.0 lb

## 2023-01-31 DIAGNOSIS — Z6841 Body Mass Index (BMI) 40.0 and over, adult: Secondary | ICD-10-CM | POA: Diagnosis not present

## 2023-01-31 DIAGNOSIS — Z1231 Encounter for screening mammogram for malignant neoplasm of breast: Secondary | ICD-10-CM

## 2023-01-31 DIAGNOSIS — E1165 Type 2 diabetes mellitus with hyperglycemia: Secondary | ICD-10-CM

## 2023-01-31 DIAGNOSIS — E039 Hypothyroidism, unspecified: Secondary | ICD-10-CM

## 2023-01-31 DIAGNOSIS — E119 Type 2 diabetes mellitus without complications: Secondary | ICD-10-CM

## 2023-01-31 DIAGNOSIS — Z0001 Encounter for general adult medical examination with abnormal findings: Secondary | ICD-10-CM

## 2023-01-31 DIAGNOSIS — R300 Dysuria: Secondary | ICD-10-CM | POA: Diagnosis not present

## 2023-01-31 DIAGNOSIS — E782 Mixed hyperlipidemia: Secondary | ICD-10-CM

## 2023-01-31 DIAGNOSIS — Z0100 Encounter for examination of eyes and vision without abnormal findings: Secondary | ICD-10-CM

## 2023-01-31 MED ORDER — SEMAGLUTIDE (1 MG/DOSE) 4 MG/3ML ~~LOC~~ SOPN: 1.0000 mg | PEN_INJECTOR | SUBCUTANEOUS | 2 refills | Status: DC

## 2023-01-31 NOTE — Telephone Encounter (Signed)
Lvm notifying patient of mamogram appointment date, arrival time, location-Toni

## 2023-01-31 NOTE — Telephone Encounter (Signed)
Awaiting 01/31/23 office notes for Ophthalmology referral-Toni

## 2023-01-31 NOTE — Progress Notes (Signed)
[facility] [address] [place], [place] [address]  Internal MEDICINE  Office Visit Note  Patient Name: Ashley Stephens  [id number]  [id number]  Date of Service: 01/31/2023  Chief Complaint  Patient presents with   Annual Exam   Diabetes   Depression   Quality Metric Gaps    Diabetic eye exam     HPI Pt is here for routine health maintenance examination -Down 8lbs since last visit and is tolerating ozempic well, would like to increase dose once she completes current pen -Got her tdap last wed and after had reaction with swelling and warmth and redness at injection site, this has resolved now as of this past Tuesday. -Due for mammgoram -will have labs previously ordered done -Does need referral for eye exam  Current Medication: Outpatient Encounter Medications as of 01/31/2023  Medication Sig Note   Adapalene-Benzoyl Peroxide 0.3-2.5 % GEL SMARTSIG:Topical Every Evening    ALPRAZolam (XANAX) 0.25 MG tablet Take 1 tablet (0.25 mg total) by mouth 2 (two) times daily as needed. for anxiety    cariprazine (VRAYLAR) 3 MG capsule Take 1 capsule (3 mg total) by mouth daily.    cholecalciferol (VITAMIN D3) 25 MCG (1000 UNIT) tablet Take 1,000 Units by mouth daily.    clindamycin (CLINDAGEL) 1 % gel SMARTSIG:Sparingly Topical Every Morning    Cyanocobalamin (VITAMIN B 12 PO) Take by mouth.    desvenlafaxine (PRISTIQ) 100 MG 24 hr tablet Take 1 tablet (100 mg total) by mouth every morning.    desvenlafaxine (PRISTIQ) 50 MG 24 hr tablet Take 1 tablet (50 mg total) by mouth at bedtime.    Dextromethorphan-buPROPion ER (AUVELITY) 45-105 MG TBCR Take 1 tablet by mouth 2 (two) times daily.    hydrOXYzine (VISTARIL) 50 MG capsule Take 1 capsule by mouth three times daily as needed    lamoTRIgine (LAMICTAL) 150 MG tablet Take 1 tablet (150 mg total) by mouth 2 (two) times daily.    levothyroxine (SYNTHROID) 25 MCG tablet Take 1 tablet (25 mcg total) by mouth daily before  breakfast.    mometasone (ELOCON) 0.1 % cream Apply topically 2 (two) times daily.    omeprazole (PRILOSEC) 40 MG capsule Take 1 capsule (40 mg total) by mouth daily. (Patient taking differently: Take 40 mg by mouth daily as needed.) 03/21/2021: Prescribed protonix    propranolol (INDERAL) 20 MG tablet Take 1 tablet (20 mg total) by mouth 3 (three) times daily as needed.    rosuvastatin (CRESTOR) 5 MG tablet Take 1 tablet (5 mg total) by mouth daily.    Semaglutide, 1 MG/DOSE, 4 MG/3ML SOPN Inject 1 mg as directed once a week.    spironolactone (ALDACTONE) 100 MG tablet Take 100 mg by mouth 2 (two) times daily.    [DISCONTINUED] Semaglutide,0.25 or 0.5MG /DOS, (OZEMPIC, 0.25 OR 0.5 MG/DOSE,) 2 MG/3ML SOPN Use 0.5 mg Q week ( needles as well)    Oxcarbazepine (TRILEPTAL) 300 MG tablet Take 1 tablet (300 mg total) by mouth 2 (two) times daily.    No facility-administered encounter medications on file as of 01/31/2023.    Surgical History: Past Surgical History:  Procedure Laterality Date   ABDOMINAL HYSTERECTOMY  2013   BARIATRIC SURGERY     BREAST BIOPSY  1995   COLONOSCOPY WITH PROPOFOL N/A 10/28/2020   Procedure: COLONOSCOPY WITH PROPOFOL;  Surgeon: Wyline Mood, MD;  Location: [facility];  Service: Gastroenterology;  Laterality: N/A;   GASTRIC BYPASS  2012   TUBAL LIGATION  1996  tummy tuck  05/27/2020    Medical History: Past Medical History:  Diagnosis Date   Anxiety    Depression    Diabetes mellitus without complication (HCC)    Gastric ulcer    GERD (gastroesophageal reflux disease)    Hypertension    Osteoarthritis    Thyroid disorder     Family History: Family History  Problem Relation Age of Onset   Breast cancer Maternal Grandmother    Diabetes Maternal Grandmother    Heart disease Maternal Grandmother    Hypertension Maternal Grandmother    Heart disease Brother    Hypertension Brother    Hyperlipidemia Brother    Diabetes Maternal Aunt    Heart disease  Maternal Aunt    Hypertension Maternal Aunt    Hyperlipidemia Maternal Aunt    Diabetes Maternal Aunt    Heart disease Maternal Aunt    Hypertension Maternal Aunt    Hyperlipidemia Maternal Aunt    Diabetes Maternal Aunt    Heart disease Maternal Aunt    Hypertension Maternal Aunt    Hyperlipidemia Maternal Aunt       Review of Systems  Constitutional:  Negative for chills, fatigue and unexpected weight change.  HENT:  Positive for postnasal drip. Negative for congestion, rhinorrhea, sneezing and sore throat.   Eyes:  Negative for redness.  Respiratory:  Negative for cough, chest tightness and shortness of breath.   Cardiovascular:  Negative for chest pain and palpitations.  Gastrointestinal:  Negative for abdominal pain, constipation, diarrhea, nausea and vomiting.  Genitourinary:  Negative for dysuria and frequency.  Musculoskeletal:  Negative for arthralgias, back pain, joint swelling and neck pain.  Skin:  Negative for rash.  Neurological: Negative.  Negative for tremors and numbness.  Hematological:  Negative for adenopathy. Does not bruise/bleed easily.  Psychiatric/Behavioral:  Negative for behavioral problems (Depression), sleep disturbance and suicidal ideas. The patient is nervous/anxious.      Vital Signs: BP 128/80   Pulse 96   Temp 97.8 F (36.6 C)   Resp 16   Ht 5\' 7"  (1.702 m)   Wt 256 lb (116.1 kg)   SpO2 97%   BMI 40.10 kg/m    Physical Exam Vitals and nursing note reviewed.  Constitutional:      General: She is not in acute distress.    Appearance: Normal appearance. She is obese. She is not ill-appearing.  HENT:     Head: Normocephalic and atraumatic.  Eyes:     Pupils: Pupils are equal, round, and reactive to light.  Cardiovascular:     Rate and Rhythm: Normal rate and regular rhythm.     Pulses:          Dorsalis pedis pulses are 3+ on the right side and 3+ on the left side.       Posterior tibial pulses are 3+ on the right side and 3+ on  the left side.  Pulmonary:     Effort: Pulmonary effort is normal. No respiratory distress.  Chest:     Chest wall: No tenderness.  Breasts:    Right: Normal.     Left: Normal.  Abdominal:     Tenderness: There is no abdominal tenderness.  Musculoskeletal:        General: Normal range of motion.     Right foot: Normal range of motion.     Left foot: Normal range of motion.  Feet:     Right foot:     Protective Sensation: 2 sites tested.  2 sites sensed.     Skin integrity: Skin integrity normal.     Toenail Condition: Right toenails are normal.     Left foot:     Protective Sensation: 2 sites tested.  2 sites sensed.     Skin integrity: Skin integrity normal.     Toenail Condition: Left toenails are normal.  Skin:    General: Skin is warm and dry.  Neurological:     Mental Status: She is alert and oriented to person, place, and time.  Psychiatric:        Mood and Affect: Mood normal.        Behavior: Behavior normal.      LABS: Recent Results (from the past 2160 hour(s))  POCT Urinalysis Dipstick     Status: Abnormal   Collection Time: 11/06/22 12:01 PM  Result Value Ref Range   Color, UA     Clarity, UA     Glucose, UA Negative Negative   Bilirubin, UA Negative    Ketones, UA Negative    Spec Grav, UA 1.010 1.010 - 1.025   Blood, UA Large    pH, UA 5.0 5.0 - 8.0   Protein, UA Negative Negative   Urobilinogen, UA 0.2 0.2 or 1.0 E.U./dL   Nitrite, UA Negative    Leukocytes, UA Large (3+) (A) Negative   Appearance     Odor    CULTURE, URINE COMPREHENSIVE     Status: None   Collection Time: 11/06/22  3:50 PM   Specimen: Urine   Urine  Result Value Ref Range   Urine Culture, Comprehensive Final report    Organism ID, Bacteria Comment     Comment: No growth in 36 - 48 hours.        Assessment/Plan: 1. Encounter for general adult medical examination with abnormal findings CPE performed, labs already ordered, foot exam done, needs eye exam and  mammogram  2. Type 2 diabetes mellitus with hyperglycemia, without long-term current use of insulin (HCC) May increase to 1mg  weekly once current pen done - Semaglutide, 1 MG/DOSE, 4 MG/3ML SOPN; Inject 1 mg as directed once a week.  Dispense: 3 mL; Refill: 2  3. Mixed hyperlipidemia Continue crestor and will check labs  4. Acquired hypothyroidism Will update labs and adjust synthroid if indicated  5. Visit for screening mammogram Will schedule mammogram  6. Diabetic eye exam [facility]) - Ambulatory referral to Ophthalmology  7. Dysuria - UA/M w/rflx Culture, Routine  8. Morbid obesity with BMI of 40.0-44.9, adult (HCC) Down 8lbs since last visit, continue to work on diet and exercise and may continue ozempic    General Counseling: Arryana verbalizes understanding of the findings of todays visit and agrees with plan of treatment. I have discussed any further diagnostic evaluation that may be needed or ordered today. We also reviewed her medications today. she has been encouraged to call the office with any questions or concerns that should arise related to todays visit.    Counseling:    Orders Placed This Encounter  Procedures   UA/M w/rflx Culture, Routine   Ambulatory referral to Ophthalmology    Meds ordered this encounter  Medications   Semaglutide, 1 MG/DOSE, 4 MG/3ML SOPN    Sig: Inject 1 mg as directed once a week.    Dispense:  3 mL    Refill:  2    This patient was seen by Lynn Ito, PA-C in collaboration with Dr. Beverely Risen as a part of collaborative care agreement.  Total time spent:35 Minutes  Time spent includes review of chart, medications, test results, and follow up plan with the patient.     Lyndon Code, MD  Internal Medicine

## 2023-02-03 LAB — UA/M W/RFLX CULTURE, ROUTINE
Bilirubin, UA: NEGATIVE
Glucose, UA: NEGATIVE
Nitrite, UA: NEGATIVE
RBC, UA: NEGATIVE
Specific Gravity, UA: >=1.03 — AB (ref 1.005–1.030)
Urobilinogen, Ur: 1 mg/dL (ref 0.2–1.0)
pH, UA: 5 (ref 5.0–7.5)

## 2023-02-03 LAB — URINE CULTURE, REFLEX: Organism ID, Bacteria: NO GROWTH

## 2023-02-03 LAB — MICROSCOPIC EXAMINATION
Bacteria, UA: NONE SEEN
Casts: NONE SEEN /lpf
Epithelial Cells (non renal): >10 /hpf — AB (ref 0–10)
RBC, Urine: NONE SEEN /hpf (ref 0–2)

## 2023-02-03 NOTE — Telephone Encounter (Signed)
Working on paper work for disability, will further complete/review with [employer]

## 2023-02-05 ENCOUNTER — Telehealth: Payer: Self-pay | Admitting: Physician Assistant

## 2023-02-05 NOTE — Telephone Encounter (Signed)
Ophthalmology referral sent via [facility] to [facility]. -Toni 

## 2023-02-05 NOTE — Telephone Encounter (Signed)
Left another vm to schedule diabetic eye exam-Toni

## 2023-02-06 ENCOUNTER — Ambulatory Visit: Payer: 59 | Admitting: Psychiatry

## 2023-02-11 ENCOUNTER — Ambulatory Visit: Payer: 59 | Admitting: Psychiatry

## 2023-02-11 NOTE — Telephone Encounter (Signed)
Pt LVM 6/7 @ 5:11p stating that the paperwork from [place] was declined by her job and she had to go back to work today, [employer] 6/10.  She is asking Shanda Bumps to call her back about her meds  No upcoming appt scheduled.

## 2023-02-11 NOTE — Telephone Encounter (Signed)
LVM to [facility] 

## 2023-02-13 NOTE — Telephone Encounter (Signed)
Sent MyChart message due to no return call from VM.

## 2023-02-19 ENCOUNTER — Telehealth: Payer: Self-pay

## 2023-02-19 NOTE — Telephone Encounter (Signed)
Lmom t that when is diabetic eye exam last here if not we can schedule  here in office

## 2023-02-20 ENCOUNTER — Ambulatory Visit: Payer: 59 | Admitting: Psychiatry

## 2023-03-06 ENCOUNTER — Ambulatory Visit (INDEPENDENT_AMBULATORY_CARE_PROVIDER_SITE_OTHER): Payer: 59 | Admitting: Psychiatry

## 2023-03-06 NOTE — Progress Notes (Signed)
Patient no showed for appt today

## 2023-03-07 ENCOUNTER — Other Ambulatory Visit: Payer: Self-pay | Admitting: Psychiatry

## 2023-03-07 DIAGNOSIS — F39 Unspecified mood [affective] disorder: Secondary | ICD-10-CM

## 2023-03-07 DIAGNOSIS — F5101 Primary insomnia: Secondary | ICD-10-CM

## 2023-03-07 DIAGNOSIS — F411 Generalized anxiety disorder: Secondary | ICD-10-CM

## 2023-03-08 NOTE — Telephone Encounter (Signed)
Please call to schedule an appt, is past due and did not respond to [employer].

## 2023-03-12 ENCOUNTER — Telehealth: Payer: Self-pay | Admitting: Physician Assistant

## 2023-03-12 NOTE — Telephone Encounter (Signed)
Pt LVM @ 12:32p requesting refill on meds she is out of.  Pt is requesting refills on Alprazolam, Pristiq, Auvelity, Hydroxyzine, Lamotrigine, Oxcarbazepine, Propranolol. (Basically everything from [employer]).  Send to  [facility] [address] [place], [place] - [address], [place] [address] Phone: [phone]  Fax: [phone]    Next appt 7/10

## 2023-03-12 NOTE — Telephone Encounter (Signed)
Lvm and sent message regarding Ophthalmology referral-Ashley Stephens

## 2023-03-13 ENCOUNTER — Encounter: Payer: Self-pay | Admitting: Psychiatry

## 2023-03-13 ENCOUNTER — Telehealth (INDEPENDENT_AMBULATORY_CARE_PROVIDER_SITE_OTHER): Payer: 59 | Admitting: Psychiatry

## 2023-03-13 DIAGNOSIS — F411 Generalized anxiety disorder: Secondary | ICD-10-CM | POA: Diagnosis not present

## 2023-03-13 DIAGNOSIS — F39 Unspecified mood [affective] disorder: Secondary | ICD-10-CM | POA: Diagnosis not present

## 2023-03-13 DIAGNOSIS — F5101 Primary insomnia: Secondary | ICD-10-CM | POA: Diagnosis not present

## 2023-03-13 MED ORDER — OXCARBAZEPINE 300 MG PO TABS
300.0000 mg | ORAL_TABLET | Freq: Two times a day (BID) | ORAL | 1 refills | Status: DC
Start: 2023-03-13 — End: 2023-07-24

## 2023-03-13 MED ORDER — HYDROXYZINE PAMOATE 50 MG PO CAPS
ORAL_CAPSULE | ORAL | 1 refills | Status: DC
Start: 2023-03-13 — End: 2023-07-24

## 2023-03-13 MED ORDER — AUVELITY 45-105 MG PO TBCR
1.0000 | EXTENDED_RELEASE_TABLET | Freq: Two times a day (BID) | ORAL | 0 refills | Status: DC
Start: 2023-03-13 — End: 2023-04-29

## 2023-03-13 MED ORDER — LAMOTRIGINE 150 MG PO TABS
ORAL_TABLET | ORAL | 0 refills | Status: DC
Start: 2023-04-10 — End: 2023-04-29

## 2023-03-13 MED ORDER — LAMOTRIGINE 100 MG PO TABS: ORAL_TABLET | ORAL | 0 refills | Status: DC

## 2023-03-13 MED ORDER — ALPRAZOLAM 0.25 MG PO TABS: 0.2500 mg | ORAL_TABLET | Freq: Two times a day (BID) | ORAL | 5 refills | Status: DC | PRN

## 2023-03-13 MED ORDER — CARIPRAZINE HCL 3 MG PO CAPS: 3.0000 mg | ORAL_CAPSULE | Freq: Every day | ORAL | 0 refills | Status: DC

## 2023-03-13 NOTE — Progress Notes (Signed)
Ashley Stephens [id number] 05/12/1976 47 y.o.  Virtual Visit via Video Note  I connected with pt @ on 03/13/23 at 10:30 AM EDT by a video enabled telemedicine application and verified that I am speaking with the correct person using two identifiers.   I discussed the limitations of evaluation and management by telemedicine and the availability of in person appointments. The patient expressed understanding and agreed to proceed.  I discussed the assessment and treatment plan with the patient. The patient was provided an opportunity to ask questions and all were answered. The patient agreed with the plan and demonstrated an understanding of the instructions.   The patient was advised to call back or seek an in-person evaluation if the symptoms worsen or if the condition fails to improve as anticipated.  I provided 36 minutes of non-face-to-face time during this encounter.  The patient was located at home.  The provider was located at [facility].   Ashley Stephens, PMHNP   Subjective:   Patient ID:  Ashley Stephens is a 69 y.o. (DOB October 14, 1975) female.  Chief Complaint:  Chief Complaint  Patient presents with   Anxiety   Depression   Insomnia    HPI Ashley Stephens presents for follow-up of Bipolar disorder, anxiety, and insomnia. She reports, "I'm out of most of my medicines." She reports that she has been without a couple of her medications. She reports that she is experiencing some anxiety and worry. She reports some situational stress. She reports that she is occasionally having panic attacks. She reports that she has anxiety in response to technology upgrade with work.   She reports that her mood is depressed throughout the day. She reports some irritability. Denies impulsive or risky behavior. Low energy and motivation. "Everything feels like a task," to include bathing. She reports that concentration requires effort. She reports that she can focus  on important meetings and calls for short periods of time and then feels tired afterwards. She reports that she has not been sleeping as well without some of her medications. She reports middle of the night awakenings around 2-3 am, even with medication. She reports suicidal thoughts, "are not the constant it was before." She reports experiencing suicidal thoughts at times when she experiences frustration or overwhelmed. Appetite has been ok. Denies SI.   She reports that she returned to work when told there was an issue with her paperwork.    Past medication trials: Wellbutrin Effexor Pristiq-effective Auvelity Lamictal-effective for mood Latuda Rexulti Xanax- Usually takes at night Propanolol- Effective Hydroxyzine Gabapentin- prescribed for back pain. Dizziness, headaches. Lithium Trileptal  Review of Systems:  Review of Systems  Musculoskeletal:  Negative for gait problem.  Neurological:  Negative for tremors.  Psychiatric/Behavioral:         Please refer to HPI    Medications: I have reviewed the patient's current medications.  Current Outpatient Medications  Medication Sig Dispense Refill   levothyroxine (SYNTHROID) 25 MCG tablet Take 1 tablet (25 mcg total) by mouth daily before breakfast. 90 tablet 1   rosuvastatin (CRESTOR) 5 MG tablet Take 1 tablet (5 mg total) by mouth daily. 30 tablet 5   Semaglutide, 1 MG/DOSE, 4 MG/3ML SOPN Inject 1 mg as directed once a week. 3 mL 2   spironolactone (ALDACTONE) 100 MG tablet Take 100 mg by mouth 2 (two) times daily.     Adapalene-Benzoyl Peroxide 0.3-2.5 % GEL SMARTSIG:Topical Every Evening     ALPRAZolam (XANAX) 0.25 MG tablet Take 1 tablet (  0.25 mg total) by mouth 2 (two) times daily as needed. for anxiety 45 tablet 5   cariprazine (VRAYLAR) 3 MG capsule Take 1 capsule (3 mg total) by mouth daily. 90 capsule 0   cholecalciferol (VITAMIN D3) 25 MCG (1000 UNIT) tablet Take 1,000 Units by mouth daily.     clindamycin (CLINDAGEL)  1 % gel SMARTSIG:Sparingly Topical Every Morning     Cyanocobalamin (VITAMIN B 12 PO) Take by mouth.     desvenlafaxine (PRISTIQ) 100 MG 24 hr tablet Take 1 tablet (100 mg total) by mouth every morning. 90 tablet 1   desvenlafaxine (PRISTIQ) 50 MG 24 hr tablet Take 1 tablet (50 mg total) by mouth at bedtime. 90 tablet 1   Dextromethorphan-buPROPion ER (AUVELITY) 45-105 MG TBCR Take 1 tablet by mouth 2 (two) times daily. 180 tablet 0   hydrOXYzine (VISTARIL) 50 MG capsule Take 1 capsule by mouth three times daily as needed 270 capsule 1   lamoTRIgine (LAMICTAL) 100 MG tablet Take 1/2 tablet daily for 2 weeks, then 1 tablet daily for 2 weeks 30 tablet 0   [START ON 04/10/2023] lamoTRIgine (LAMICTAL) 150 MG tablet Take 1 tablet daily for 2 weeks, then 1.5 tablets daily for 2 weeks, then 1 tablet twice daily 180 tablet 0   mometasone (ELOCON) 0.1 % cream Apply topically 2 (two) times daily.     omeprazole (PRILOSEC) 40 MG capsule Take 1 capsule (40 mg total) by mouth daily. (Patient taking differently: Take 40 mg by mouth daily as needed.) 30 capsule 3   Oxcarbazepine (TRILEPTAL) 300 MG tablet Take 1 tablet (300 mg total) by mouth 2 (two) times daily. 180 tablet 1   propranolol (INDERAL) 20 MG tablet Take 1 tablet (20 mg total) by mouth 3 (three) times daily as needed. 270 tablet 1   No current facility-administered medications for this visit.    Medication Side Effects: None  Allergies: No Known Allergies  Past Medical History:  Diagnosis Date   Anxiety    Depression    Diabetes mellitus without complication (HCC)    Gastric ulcer    GERD (gastroesophageal reflux disease)    Hypertension    Osteoarthritis    Thyroid disorder     Family History  Problem Relation Age of Onset   Breast cancer Maternal Grandmother    Diabetes Maternal Grandmother    Heart disease Maternal Grandmother    Hypertension Maternal Grandmother    Heart disease Brother    Hypertension Brother    Hyperlipidemia  Brother    Diabetes Maternal Aunt    Heart disease Maternal Aunt    Hypertension Maternal Aunt    Hyperlipidemia Maternal Aunt    Diabetes Maternal Aunt    Heart disease Maternal Aunt    Hypertension Maternal Aunt    Hyperlipidemia Maternal Aunt    Diabetes Maternal Aunt    Heart disease Maternal Aunt    Hypertension Maternal Aunt    Hyperlipidemia Maternal Aunt     Social History   Socioeconomic History   Marital status: Single    Spouse name: Not on file   Number of children: 1   Years of education: Not on file   Highest education level: Not on file  Occupational History   Occupation: Education officer, community  Tobacco Use   Smoking status: Never   Smokeless tobacco: Never  Vaping Use   Vaping Use: Never used  Substance and Sexual Activity   Alcohol use: No    Alcohol/week: 0.0 standard drinks of  alcohol   Drug use: No   Sexual activity: Yes    Birth control/protection: Surgical  Other Topics Concern   Not on file  Social History Narrative   Not on file   Social Determinants of Health   Financial Resource Strain: Not on file  Food Insecurity: No Food Insecurity (08/22/2020)   Hunger Vital Sign    Worried About Running Out of Food in the Last Year: Never true    Ran Out of Food in the Last Year: Never true  Transportation Needs: No Transportation Needs (08/22/2020)   PRAPARE - Administrator, Civil Service (Medical): No    Lack of Transportation (Non-Medical): No  Physical Activity: Not on file  Stress: Not on file  Social Connections: Not on file  Intimate Partner Violence: Not on file    Past Medical History, Surgical history, Social history, and Family history were reviewed and updated as appropriate.   Please see review of systems for further details on the patient's review from today.   Objective:   Physical Exam:  There were no vitals taken for this visit.  Physical Exam Neurological:     Mental Status: She is alert and oriented to person,  place, and time.     Cranial Nerves: No dysarthria.  Psychiatric:        Attention and Perception: Attention and perception normal.        Mood and Affect: Mood is anxious and depressed.        Speech: Speech normal.        Behavior: Behavior is cooperative.        Thought Content: Thought content normal. Thought content is not paranoid or delusional. Thought content does not include homicidal or suicidal ideation. Thought content does not include homicidal or suicidal plan.        Cognition and Memory: Cognition and memory normal.        Judgment: Judgment normal.     Comments: Insight intact     Lab Review:     Component Value Date/Time   NA 134 (L) 10/22/2022 1858   NA 142 01/25/2022 0947   NA 142 12/03/2011 1059   K 4.3 10/22/2022 1858   K 3.4 (L) 12/03/2011 1059   CL 101 10/22/2022 1858   CL 107 12/03/2011 1059   CO2 18 (L) 10/22/2022 1858   CO2 27 12/03/2011 1059   GLUCOSE 162 (H) 10/22/2022 1858   GLUCOSE 66 12/03/2011 1059   BUN 12 10/22/2022 1858   BUN 7 01/25/2022 0947   BUN 4 (L) 12/03/2011 1059   CREATININE 1.21 (H) 10/22/2022 1858   CREATININE 0.82 12/03/2011 1059   CALCIUM 10.5 (H) 10/22/2022 1858   CALCIUM 8.5 12/03/2011 1059   PROT 8.7 (H) 10/22/2022 1858   PROT 6.5 01/25/2022 0947   ALBUMIN 5.2 (H) 10/22/2022 1858   ALBUMIN 4.3 01/25/2022 0947   AST 26 10/22/2022 1858   ALT 22 10/22/2022 1858   ALKPHOS 90 10/22/2022 1858   BILITOT 0.3 10/22/2022 1858   BILITOT 0.2 01/25/2022 0947   GFRNONAA 55 (L) 10/22/2022 1858   GFRNONAA >60 12/03/2011 1059   GFRAA 78 07/10/2019 0953   GFRAA >60 12/03/2011 1059       Component Value Date/Time   WBC 10.3 10/22/2022 1858   RBC 5.80 (H) 10/22/2022 1858   HGB 17.5 (H) 10/22/2022 1858   HGB 14.6 01/25/2022 0947   HCT 50.0 (H) 10/22/2022 1858   HCT 42.0 01/25/2022 0947  PLT 374 10/22/2022 1858   PLT 294 01/25/2022 0947   MCV 86.2 10/22/2022 1858   MCV 87 01/25/2022 0947   MCH 30.2 10/22/2022 1858   MCHC  35.0 10/22/2022 1858   RDW 12.9 10/22/2022 1858   RDW 12.8 01/25/2022 0947   LYMPHSABS 1.7 10/22/2022 1858   LYMPHSABS 1.6 01/25/2022 0947   MONOABS 0.5 10/22/2022 1858   EOSABS 0.1 10/22/2022 1858   EOSABS 0.1 01/25/2022 0947   BASOSABS 0.0 10/22/2022 1858   BASOSABS 0.0 01/25/2022 0947    No results found for: "POCLITH", "LITHIUM"   No results found for: "PHENYTOIN", "PHENOBARB", "VALPROATE", "CBMZ"   .res Assessment: Plan:   I spent 40 minutes dedicated to the care of this patient on the date of this  encounter to include pre-visit review of records, face-to-face time with the patient discussing re-starting Lamictal and continuing to take medications more consistently, ordering of medication, and post visit documentation. Recommend continuing intermittent FMLA without changes.  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 Discussed signs of Levonne Spiller syndrome and advised pt to contact office if she develops a rash.  Discussed continuing to be more intentional about taking medications as prescribed since pt reports that she has made some progress in this area and notices that mood may be more stable as a result. Discussed that irregularly taking medications may increase risk of side effects and also discontinuation/withdrawal. Encouraged pt to work with therapist about thought patterns surrounding medication, ie., taking an extra tablet when having severe anxiety to possibly feel like she is taking some control of the way she is feeling, etc. She reports that increase in Vraylar to 3 mg daily has been helpful for her mood. Will continue Vraylar 1.5 mg daily for mood symptoms.  Will continue all other medications as prescribed.  Recommend continuing therapy with Rockne Menghini, LCSW.  Pt to follow-up with this provider in 4 weeks or sooner if clinically indicated.  Patient  advised to contact office with any questions, adverse effects, or acute worsening in signs and symptoms.   Keiana was seen today for anxiety, depression and insomnia.  Diagnoses and all orders for this visit:  Generalized anxiety disorder -     Oxcarbazepine (TRILEPTAL) 300 MG tablet; Take 1 tablet (300 mg total) by mouth 2 (two) times daily. -     ALPRAZolam (XANAX) 0.25 MG tablet; Take 1 tablet (0.25 mg total) by mouth 2 (two) times daily as needed. for anxiety  Primary insomnia -     Oxcarbazepine (TRILEPTAL) 300 MG tablet; Take 1 tablet (300 mg total) by mouth 2 (two) times daily. -     hydrOXYzine (VISTARIL) 50 MG capsule; Take 1 capsule by mouth three times daily as needed  Mood disorder (HCC) -     Oxcarbazepine (TRILEPTAL) 300 MG tablet; Take 1 tablet (300 mg total) by mouth 2 (two) times daily. -     lamoTRIgine (LAMICTAL) 100 MG tablet; Take 1/2 tablet daily for 2 weeks, then 1 tablet daily for 2 weeks -     lamoTRIgine (LAMICTAL) 150 MG tablet; Take 1 tablet daily for 2 weeks, then 1.5 tablets daily for 2 weeks, then 1 tablet twice daily -     cariprazine (VRAYLAR) 3 MG capsule; Take 1 capsule (3 mg total) by mouth daily. -     Dextromethorphan-buPROPion ER (AUVELITY) 45-105 MG TBCR; Take 1 tablet by mouth  2 (two) times daily.     Please see After Visit Summary for patient specific instructions.  Future Appointments  Date Time Provider Department Center  04/01/2023  9:20 AM McDonough, Salomon Fick, PA-C [facility] None  02/06/2024  9:00 AM McDonough, Salomon Fick, PA-C [facility] None    No orders of the defined types were placed in this encounter.     -------------------------------

## 2023-03-25 ENCOUNTER — Telehealth: Payer: Self-pay | Admitting: Psychiatry

## 2023-03-25 NOTE — Telephone Encounter (Signed)
Received FMLA pw and [employer] forms. Given to [facility] 7/22 Patient needs ASAP

## 2023-03-26 ENCOUNTER — Telehealth: Payer: Self-pay | Admitting: Physician Assistant

## 2023-03-26 DIAGNOSIS — Z0289 Encounter for other administrative examinations: Secondary | ICD-10-CM

## 2023-03-26 NOTE — Telephone Encounter (Signed)
Per [facility] w/ [facility], referral has been closed due to patient not replying to calls to schedule-Toni

## 2023-03-27 NOTE — Telephone Encounter (Signed)
Paperwork completed and faxed.  °

## 2023-04-01 ENCOUNTER — Ambulatory Visit: Payer: 59 | Admitting: Physician Assistant

## 2023-04-12 ENCOUNTER — Other Ambulatory Visit: Payer: Self-pay | Admitting: Psychiatry

## 2023-04-12 DIAGNOSIS — F411 Generalized anxiety disorder: Secondary | ICD-10-CM

## 2023-04-23 ENCOUNTER — Other Ambulatory Visit: Payer: Self-pay | Admitting: Physician Assistant

## 2023-04-23 DIAGNOSIS — E1165 Type 2 diabetes mellitus with hyperglycemia: Secondary | ICD-10-CM

## 2023-04-24 ENCOUNTER — Encounter: Payer: Self-pay | Admitting: Nurse Practitioner

## 2023-04-24 ENCOUNTER — Ambulatory Visit: Payer: 59 | Admitting: Nurse Practitioner

## 2023-04-24 DIAGNOSIS — Z113 Encounter for screening for infections with a predominantly sexual mode of transmission: Secondary | ICD-10-CM

## 2023-04-24 DIAGNOSIS — K219 Gastro-esophageal reflux disease without esophagitis: Secondary | ICD-10-CM

## 2023-04-24 DIAGNOSIS — Z0001 Encounter for general adult medical examination with abnormal findings: Secondary | ICD-10-CM

## 2023-04-24 DIAGNOSIS — E039 Hypothyroidism, unspecified: Secondary | ICD-10-CM

## 2023-04-24 DIAGNOSIS — Z1211 Encounter for screening for malignant neoplasm of colon: Secondary | ICD-10-CM

## 2023-04-24 DIAGNOSIS — R300 Dysuria: Secondary | ICD-10-CM

## 2023-04-24 DIAGNOSIS — F411 Generalized anxiety disorder: Secondary | ICD-10-CM

## 2023-04-24 DIAGNOSIS — E1165 Type 2 diabetes mellitus with hyperglycemia: Secondary | ICD-10-CM

## 2023-04-24 DIAGNOSIS — Z124 Encounter for screening for malignant neoplasm of cervix: Secondary | ICD-10-CM

## 2023-04-24 DIAGNOSIS — Z1231 Encounter for screening mammogram for malignant neoplasm of breast: Secondary | ICD-10-CM

## 2023-04-24 LAB — POCT GLYCOSYLATED HEMOGLOBIN (HGB A1C): Hemoglobin A1C: 5.8 % — AB (ref 4.0–5.6)

## 2023-04-24 MED ORDER — OMEPRAZOLE 40 MG PO CPDR: 40.0000 mg | DELAYED_RELEASE_CAPSULE | Freq: Every day | ORAL | 3 refills | Status: DC

## 2023-04-24 NOTE — Progress Notes (Signed)
[facility] [address] [place], [place] [address]  Internal MEDICINE  Office Visit Note  Patient Name: Ashley Stephens  [id number]  [id number]  Date of Service: 04/24/2023  Chief Complaint  Patient presents with   Acute Visit    Stomach pain    HPI Nehemie presents for a follow-up visit for GERD and diabetes. GERD -- she is due for refills of her omeprazole and is requesting this.  Diabetes -- she is due to have her A1c checked. Her A1c is 5.8 and has improved from 6.2 in February.    Current Medication: Outpatient Encounter Medications as of 04/24/2023  Medication Sig Note   Adapalene-Benzoyl Peroxide 0.3-2.5 % GEL SMARTSIG:Topical Every Evening    ALPRAZolam (XANAX) 0.25 MG tablet Take 1 tablet (0.25 mg total) by mouth 2 (two) times daily as needed. for anxiety    cholecalciferol (VITAMIN D3) 25 MCG (1000 UNIT) tablet Take 1,000 Units by mouth daily.    clindamycin (CLINDAGEL) 1 % gel SMARTSIG:Sparingly Topical Every Morning    Cyanocobalamin (VITAMIN B 12 PO) Take by mouth.    desvenlafaxine (PRISTIQ) 100 MG 24 hr tablet Take 1 tablet (100 mg total) by mouth every morning.    desvenlafaxine (PRISTIQ) 50 MG 24 hr tablet Take 1 tablet (50 mg total) by mouth at bedtime.    hydrOXYzine (VISTARIL) 50 MG capsule Take 1 capsule by mouth three times daily as needed    levothyroxine (SYNTHROID) 25 MCG tablet Take 1 tablet (25 mcg total) by mouth daily before breakfast.    mometasone (ELOCON) 0.1 % cream Apply topically 2 (two) times daily.    Oxcarbazepine (TRILEPTAL) 300 MG tablet Take 1 tablet (300 mg total) by mouth 2 (two) times daily.    OZEMPIC, 1 MG/DOSE, 4 MG/3ML SOPN INJECT 1 MG SUBCUTANEOUSLY  ONCE A WEEK    propranolol (INDERAL) 20 MG tablet Take 1 tablet by mouth three times daily as needed    rosuvastatin (CRESTOR) 5 MG tablet Take 1 tablet (5 mg total) by mouth daily.    spironolactone (ALDACTONE) 100 MG tablet Take 100 mg by mouth 2 (two) times  daily.    [DISCONTINUED] cariprazine (VRAYLAR) 3 MG capsule Take 1 capsule (3 mg total) by mouth daily.    [DISCONTINUED] Dextromethorphan-buPROPion ER (AUVELITY) 45-105 MG TBCR Take 1 tablet by mouth 2 (two) times daily.    [DISCONTINUED] lamoTRIgine (LAMICTAL) 100 MG tablet Take 1/2 tablet daily for 2 weeks, then 1 tablet daily for 2 weeks    [DISCONTINUED] lamoTRIgine (LAMICTAL) 150 MG tablet Take 1 tablet daily for 2 weeks, then 1.5 tablets daily for 2 weeks, then 1 tablet twice daily    [DISCONTINUED] omeprazole (PRILOSEC) 40 MG capsule Take 1 capsule (40 mg total) by mouth daily. (Patient taking differently: Take 40 mg by mouth daily as needed.) 03/21/2021: Prescribed protonix    omeprazole (PRILOSEC) 40 MG capsule Take 1 capsule (40 mg total) by mouth daily.    No facility-administered encounter medications on file as of 04/24/2023.    Surgical History: Past Surgical History:  Procedure Laterality Date   ABDOMINAL HYSTERECTOMY  2013   BARIATRIC SURGERY     BREAST BIOPSY  1995   COLONOSCOPY WITH PROPOFOL N/A 10/28/2020   Procedure: COLONOSCOPY WITH PROPOFOL;  Surgeon: Wyline Mood, MD;  Location: [facility] ENDOSCOPY;  Service: Gastroenterology;  Laterality: N/A;   GASTRIC BYPASS  2012   TUBAL LIGATION  1996   tummy tuck  05/27/2020    Medical History: Past Medical  History:  Diagnosis Date   Anxiety    Depression    Diabetes mellitus without complication (HCC)    Gastric ulcer    GERD (gastroesophageal reflux disease)    Hypertension    Osteoarthritis    Thyroid disorder     Family History: Family History  Problem Relation Age of Onset   Breast cancer Maternal Grandmother    Diabetes Maternal Grandmother    Heart disease Maternal Grandmother    Hypertension Maternal Grandmother    Heart disease Brother    Hypertension Brother    Hyperlipidemia Brother    Diabetes Maternal Aunt    Heart disease Maternal Aunt    Hypertension Maternal Aunt    Hyperlipidemia Maternal Aunt     Diabetes Maternal Aunt    Heart disease Maternal Aunt    Hypertension Maternal Aunt    Hyperlipidemia Maternal Aunt    Diabetes Maternal Aunt    Heart disease Maternal Aunt    Hypertension Maternal Aunt    Hyperlipidemia Maternal Aunt     Social History   Socioeconomic History   Marital status: Single    Spouse name: Not on file   Number of children: 1   Years of education: Not on file   Highest education level: Not on file  Occupational History   Occupation: Education officer, community  Tobacco Use   Smoking status: Never   Smokeless tobacco: Never  Vaping Use   Vaping status: Never Used  Substance and Sexual Activity   Alcohol use: No    Alcohol/week: 0.0 standard drinks of alcohol   Drug use: No   Sexual activity: Yes    Birth control/protection: Surgical  Other Topics Concern   Not on file  Social History Narrative   Not on file   Social Determinants of Health   Financial Resource Strain: Low Risk  (03/13/2021)   Received from [facility]   Overall Financial Resource Strain (CARDIA)    Difficulty of Paying Living Expenses: Not hard at all  Food Insecurity: No Food Insecurity (03/13/2021)   Received from [facility]   Hunger Vital Sign    Worried About Running Out of Food in the Last Year: Never true    Ran Out of Food in the Last Year: Never true  Transportation Needs: No Transportation Needs (03/13/2021)   [facility] from [facility]   PRAPARE - Transportation    Lack of Transportation (Medical): No    Lack of Transportation (Non-Medical): No  Physical Activity: Not on file  Stress: Not on file  Social Connections: Not on file  Intimate Partner Violence: Not on file      Review of Systems  Constitutional:  Negative for chills, fatigue and unexpected weight change.  HENT:  Negative for congestion, postnasal drip, rhinorrhea, sneezing and sore throat.   Eyes:  Negative for redness.  Respiratory:  Negative.  Negative for cough, chest tightness, shortness of breath and wheezing.   Cardiovascular: Negative.  Negative for chest pain and palpitations.  Gastrointestinal:  Negative for abdominal pain, constipation, diarrhea, nausea and vomiting.  Genitourinary:  Negative for dysuria and frequency.  Musculoskeletal:  Negative for arthralgias, back pain, joint swelling and neck pain.  Skin:  Negative for rash.  Neurological: Negative.  Negative for tremors and numbness.  Hematological:  Negative for adenopathy. Does not bruise/bleed easily.  Psychiatric/Behavioral:  Negative for behavioral problems (Depression), sleep disturbance and suicidal ideas. The patient is not nervous/anxious.  Vital Signs: BP 128/84   Pulse 87   Temp 98 F (36.7 C)   Resp 16   Ht 5\' 7"  (1.702 m)   Wt 244 lb 6.4 oz (110.9 kg)   SpO2 99%   BMI 38.28 kg/m    Physical Exam Vitals reviewed.  Constitutional:      General: She is not in acute distress.    Appearance: Normal appearance. She is obese. She is not ill-appearing.  HENT:     Head: Normocephalic and atraumatic.  Eyes:     Pupils: Pupils are equal, round, and reactive to light.  Cardiovascular:     Rate and Rhythm: Normal rate and regular rhythm.  Pulmonary:     Effort: Pulmonary effort is normal. No respiratory distress.  Neurological:     Mental Status: She is alert and oriented to person, place, and time.  Psychiatric:        Mood and Affect: Mood normal.        Behavior: Behavior normal.        Assessment/Plan: 1. Gastroesophageal reflux disease without esophagitis Continue omeprazole as prescribed.  - omeprazole (PRILOSEC) 40 MG capsule; Take 1 capsule (40 mg total) by mouth daily.  Dispense: 30 capsule; Refill: 3  2. Type 2 diabetes mellitus with hyperglycemia, without long-term current use of insulin (HCC) A1c improving, continue current medications and diet modifications.  - POCT glycosylated hemoglobin (Hb A1C)   General  Counseling: Rennie verbalizes understanding of the findings of todays visit and agrees with plan of treatment. I have discussed any further diagnostic evaluation that may be needed or ordered today. We also reviewed her medications today. she has been encouraged to call the office with any questions or concerns that should arise related to todays visit.    Orders Placed This Encounter  Procedures   POCT glycosylated hemoglobin (Hb A1C)    Meds ordered this encounter  Medications   omeprazole (PRILOSEC) 40 MG capsule    Sig: Take 1 capsule (40 mg total) by mouth daily.    Dispense:  30 capsule    Refill:  3    Fill new script today    Return in about 3 months (around 07/25/2023) for F/U with Leotis Shames PA PCP.   Total time spent:30 Minutes Time spent includes review of chart, medications, test results, and follow up plan with the patient.   [place] Controlled Substance Database was reviewed by me.  This patient was seen by Sallyanne Kuster, FNP-C in collaboration with Dr. Beverely Risen as a part of collaborative care agreement.   Fawne Hughley R. Tedd Sias, MSN, FNP-C Internal medicine

## 2023-04-26 ENCOUNTER — Ambulatory Visit: Payer: 59 | Admitting: Physician Assistant

## 2023-04-29 ENCOUNTER — Telehealth (INDEPENDENT_AMBULATORY_CARE_PROVIDER_SITE_OTHER): Payer: 59 | Admitting: Psychiatry

## 2023-04-29 ENCOUNTER — Encounter: Payer: Self-pay | Admitting: Psychiatry

## 2023-04-29 DIAGNOSIS — F5101 Primary insomnia: Secondary | ICD-10-CM

## 2023-04-29 DIAGNOSIS — F39 Unspecified mood [affective] disorder: Secondary | ICD-10-CM

## 2023-04-29 DIAGNOSIS — F411 Generalized anxiety disorder: Secondary | ICD-10-CM

## 2023-04-29 MED ORDER — LAMOTRIGINE 100 MG PO TABS
ORAL_TABLET | ORAL | 0 refills | Status: DC
Start: 2023-04-29 — End: 2023-07-24

## 2023-04-29 MED ORDER — AUVELITY 45-105 MG PO TBCR: 1.0000 | EXTENDED_RELEASE_TABLET | Freq: Two times a day (BID) | ORAL | 0 refills | Status: DC

## 2023-04-29 MED ORDER — CARIPRAZINE HCL 3 MG PO CAPS
3.0000 mg | ORAL_CAPSULE | Freq: Every day | ORAL | 0 refills | Status: DC
Start: 2023-04-29 — End: 2023-07-24

## 2023-04-29 NOTE — Progress Notes (Unsigned)
Ashley Stephens [id number] [birth year] 47 y.o.  Virtual Visit via Video Note  I connected with pt @ on 04/29/23 at  9:30 AM EDT by a video enabled telemedicine application and verified that I am speaking with the correct person using two identifiers.   I discussed the limitations of evaluation and management by telemedicine and the availability of in person appointments. The patient expressed understanding and agreed to proceed.  I discussed the assessment and treatment plan with the patient. The patient was provided an opportunity to ask questions and all were answered. The patient agreed with the plan and demonstrated an understanding of the instructions.   The patient was advised to call back or seek an in-person evaluation if the symptoms worsen or if the condition fails to improve as anticipated.  I provided 41 minutes of non-face-to-face time during this encounter.  The patient was located at home.  The provider was located at [facility].   Corie Chiquito, PMHNP   Subjective:   Patient ID:  Ashley Stephens is a 47 y.o. (DOB 11-Nov-1975) female.  Chief Complaint:  Chief Complaint  Patient presents with   Anxiety   Depression   Other    Irritability    HPI Ashley Stephens presents for follow-up of anxiety, mood disturbance, and insomnia. She reports that she has been, "not great, but not where I was when I was not able to function." She reports that she has been having some moments of increased anxiety and panic where she has to "get to myself." She reports that she had "a moment... 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 She reports that she occasionally has to leave a store due to panic and then get frustrated with herself because she has to "go back" later. She reports worry about her job. She reports that her depression, "is still there, but not as 'in  your face' as the anxiety is." She reports irritability. She reports that she is angry when things are not in order. She reports that she is having multiple awakenings throughout the night and sometimes is awake for several hours before being able to return to sleep. She reports very low energy. She reports feeling, "tired all the time." Motivation is low and reports that she is behind on cleaning. She reports decreased appetite and intentional weight loss with Ozempic. She reports that her concentration is "ok when I make myself concentrate." She reports some passive death wishes when she is experiencing panic. Denies SI.   She reports that she has been having to advocate for herself and her mental health needs.   Moved in January. Lives alone.   Past medication trials: Wellbutrin Effexor Pristiq-effective Auvelity Lamictal-effective for mood Latuda Rexulti Xanax- Usually takes at night Propanolol- Effective Hydroxyzine Gabapentin- prescribed for back pain. Dizziness, headaches. Lithium Trileptal  Review of Systems:  Review of Systems  Endocrine:       Improved glycemic control with Ozempic  Musculoskeletal:  Negative for gait problem.  Neurological:  Negative for tremors.  Psychiatric/Behavioral:         Please refer to HPI    Medications: I have reviewed the patient's current medications.  Current Outpatient Medications  Medication Sig Dispense Refill   ALPRAZolam (XANAX) 0.25 MG tablet Take 1 tablet (0.25 mg total) by mouth 2 (two) times daily as needed. for anxiety 45 tablet 5   cholecalciferol (VITAMIN D3) 25  MCG (1000 UNIT) tablet Take 1,000 Units by mouth daily.     Cyanocobalamin (VITAMIN B 12 PO) Take by mouth.     desvenlafaxine (PRISTIQ) 100 MG 24 hr tablet Take 1 tablet (100 mg total) by mouth every morning. 90 tablet 1   desvenlafaxine (PRISTIQ) 50 MG 24 hr tablet Take 1 tablet (50 mg total) by mouth at bedtime. 90 tablet 1   hydrOXYzine (VISTARIL) 50 MG capsule  Take 1 capsule by mouth three times daily as needed 270 capsule 1   levothyroxine (SYNTHROID) 25 MCG tablet Take 1 tablet (25 mcg total) by mouth daily before breakfast. 90 tablet 1   mometasone (ELOCON) 0.1 % cream Apply topically 2 (two) times daily.     omeprazole (PRILOSEC) 40 MG capsule Take 1 capsule (40 mg total) by mouth daily. 30 capsule 3   Oxcarbazepine (TRILEPTAL) 300 MG tablet Take 1 tablet (300 mg total) by mouth 2 (two) times daily. 180 tablet 1   OZEMPIC, 1 MG/DOSE, 4 MG/3ML SOPN INJECT 1 MG SUBCUTANEOUSLY  ONCE A WEEK 3 mL 0   propranolol (INDERAL) 20 MG tablet Take 1 tablet by mouth three times daily as needed 270 tablet 0   rosuvastatin (CRESTOR) 5 MG tablet Take 1 tablet (5 mg total) by mouth daily. 30 tablet 5   spironolactone (ALDACTONE) 100 MG tablet Take 100 mg by mouth 2 (two) times daily.     Adapalene-Benzoyl Peroxide 0.3-2.5 % GEL SMARTSIG:Topical Every Evening     cariprazine (VRAYLAR) 3 MG capsule Take 1 capsule (3 mg total) by mouth daily. 90 capsule 0   clindamycin (CLINDAGEL) 1 % gel SMARTSIG:Sparingly Topical Every Morning     Dextromethorphan-buPROPion ER (AUVELITY) 45-105 MG TBCR Take 1 tablet by mouth 2 (two) times daily. 180 tablet 0   lamoTRIgine (LAMICTAL) 100 MG tablet Take 1 tablet in the morning and 1.5 tablets at bedtime for 2 weeks, then 1 tablet twice daily 180 tablet 0   No current facility-administered medications for this visit.    Medication Side Effects: Word finding difficulties  Allergies: No Known Allergies  Past Medical History:  Diagnosis Date   Anxiety    Depression    Diabetes mellitus without complication (HCC)    Gastric ulcer    GERD (gastroesophageal reflux disease)    Hypertension    Osteoarthritis    Thyroid disorder     Family History  Problem Relation Age of Onset   Breast cancer Maternal Grandmother    Diabetes Maternal Grandmother    Heart disease Maternal Grandmother    Hypertension Maternal Grandmother     Heart disease Brother    Hypertension Brother    Hyperlipidemia Brother    Diabetes Maternal Aunt    Heart disease Maternal Aunt    Hypertension Maternal Aunt    Hyperlipidemia Maternal Aunt    Diabetes Maternal Aunt    Heart disease Maternal Aunt    Hypertension Maternal Aunt    Hyperlipidemia Maternal Aunt    Diabetes Maternal Aunt    Heart disease Maternal Aunt    Hypertension Maternal Aunt    Hyperlipidemia Maternal Aunt     Social History   Socioeconomic History   Marital status: Single    Spouse name: Not on file   Number of children: 1   Years of education: Not on file   Highest education level: Not on file  Occupational History   Occupation: Education officer, community  Tobacco Use   Smoking status: Never   Smokeless tobacco: Never  Vaping Use   Vaping status: Never Used  Substance and Sexual Activity   Alcohol use: No    Alcohol/week: 0.0 standard drinks of alcohol   Drug use: No   Sexual activity: Yes    Birth control/protection: Surgical  Other Topics Concern   Not on file  Social History Narrative   Not on file   Social Determinants of Health   Financial Resource Strain: Low Risk  (03/13/2021)   Received from [facility]   Overall Financial Resource Strain (CARDIA)    Difficulty of Paying Living Expenses: Not hard at all  Food Insecurity: No Food Insecurity (03/13/2021)   Received [facility]   Hunger Vital Sign    Worried About Running Out of Food in the Last Year: Never true    Ran Out of Food in the Last Year: Never true  Transportation Needs: No Transportation Needs (03/13/2021)   Received from [facility]   PRAPARE - Transportation    Lack of Transportation (Medical): No    Lack of Transportation (Non-Medical): No  Physical Activity: Not on file  Stress: Not on file  Social Connections: Not on file  Intimate Partner Violence: Not on file    Past Medical History, Surgical history, Social history, and Family history were reviewed and  updated as appropriate.   Please see review of systems for further details on the patient's review from today.   Objective:   Physical Exam:  There were no vitals taken for this visit.  Physical Exam Neurological:     Mental Status: She is alert and oriented to person, place, and time.     Cranial Nerves: No dysarthria.  Psychiatric:        Attention and Perception: Attention and perception normal.        Mood and Affect: Mood is anxious and depressed.        Speech: Speech normal.        Behavior: Behavior is cooperative.        Thought Content: Thought content normal. Thought content is not paranoid or delusional. Thought content does not include homicidal or suicidal ideation. Thought content does not include homicidal or suicidal plan.        Cognition and Memory: Cognition and memory normal.        Judgment: Judgment normal.     Comments: Insight intact Dysphoric mood     Lab Review:     Component Value Date/Time   NA 134 (L) 10/22/2022 1858   NA 142 01/25/2022 0947   NA 142 12/03/2011 1059   K 4.3 10/22/2022 1858   K 3.4 (L) 12/03/2011 1059   CL 101 10/22/2022 1858   CL 107 12/03/2011 1059   CO2 18 (L) 10/22/2022 1858   CO2 27 12/03/2011 1059   GLUCOSE 162 (H) 10/22/2022 1858   GLUCOSE 66 12/03/2011 1059   BUN 12 10/22/2022 1858   BUN 7 01/25/2022 0947   BUN 4 (L) 12/03/2011 1059   CREATININE 1.21 (H) 10/22/2022 1858   CREATININE 0.82 12/03/2011 1059   CALCIUM 10.5 (H) 10/22/2022 1858   CALCIUM 8.5 12/03/2011 1059   PROT 8.7 (H) 10/22/2022 1858   PROT 6.5 01/25/2022 0947   ALBUMIN 5.2 (H) 10/22/2022 1858   ALBUMIN 4.3 01/25/2022 0947   AST 26 10/22/2022 1858   ALT 22 10/22/2022 1858   ALKPHOS 90 10/22/2022 1858   BILITOT 0.3 10/22/2022 1858   BILITOT 0.2 01/25/2022 0947   GFRNONAA 55 (L) 10/22/2022  1858   GFRNONAA >60 12/03/2011 1059   GFRAA 78 07/10/2019 0953   GFRAA >60 12/03/2011 1059       Component Value Date/Time   WBC 10.3 10/22/2022 1858    RBC 5.80 (H) 10/22/2022 1858   HGB 17.5 (H) 10/22/2022 1858   HGB 14.6 01/25/2022 0947   HCT 50.0 (H) 10/22/2022 1858   HCT 42.0 01/25/2022 0947   PLT 374 10/22/2022 1858   PLT 294 01/25/2022 0947   MCV 86.2 10/22/2022 1858   MCV 87 01/25/2022 0947   MCH 30.2 10/22/2022 1858   MCHC 35.0 10/22/2022 1858   RDW 12.9 10/22/2022 1858   RDW 12.8 01/25/2022 0947   LYMPHSABS 1.7 10/22/2022 1858   LYMPHSABS 1.6 01/25/2022 0947   MONOABS 0.5 10/22/2022 1858   EOSABS 0.1 10/22/2022 1858   EOSABS 0.1 01/25/2022 0947   BASOSABS 0.0 10/22/2022 1858   BASOSABS 0.0 01/25/2022 0947    No results found for: "POCLITH", "LITHIUM"   No results found for: "PHENYTOIN", "PHENOBARB", "VALPROATE", "CBMZ"   .res Assessment: Plan:    43 minutes spent dedicated to the care of this patient on the date of this encounter to include pre-visit review of records, ordering of medication, post visit documentation, and face-to-face time with the patient discussing possible cognitive side effects of Lamictal, particularly with higher doses. Discussed that lowering dose may help reduce frequency and severity of word finding difficulties and losing her train of thought. Pt is in agreement with reducing dose and expresses relief that this may be a medication side effect since she has had anxiety that this may be a sign of an undiagnosed neurological cause. Will decrease Lamictal to 100 mg in the morning and 150 mg at bedtime for 2 weeks, then decrease Lamictal to 100 mg twice daily.  Will continue all other medications without changes.  Pt reports that she plans to schedule appointments with Rockne Menghini, LCSW.  Pt to follow-up with this provider in 6 weeks or sooner if clinically indicated.  Patient advised to contact office with any questions, adverse effects, or acute worsening in signs and symptoms.    Ashley Stephens was seen today for anxiety, depression and other.  Diagnoses and all orders for this visit:  Mood  disorder (HCC) -     lamoTRIgine (LAMICTAL) 100 MG tablet; Take 1 tablet in the morning and 1.5 tablets at bedtime for 2 weeks, then 1 tablet twice daily -     cariprazine (VRAYLAR) 3 MG capsule; Take 1 capsule (3 mg total) by mouth daily. -     Dextromethorphan-buPROPion ER (AUVELITY) 45-105 MG TBCR; Take 1 tablet by mouth 2 (two) times daily.  Generalized anxiety disorder  Primary insomnia     Please see After Visit Summary for patient specific instructions.  Future Appointments  Date Time Provider Department Center  05/24/2023 10:40 AM McDonough, Salomon Fick, PA-C [facility] None  02/06/2024  9:00 AM McDonough, Salomon Fick, PA-C [facility] None    No orders of the defined types were placed in this encounter.     -------------------------------

## 2023-05-01 ENCOUNTER — Telehealth: Payer: Self-pay

## 2023-05-01 NOTE — Telephone Encounter (Signed)
Pt lmom message that she change ozempic dose lmom to pt she can discuss  with lauren at next visit

## 2023-05-19 ENCOUNTER — Other Ambulatory Visit: Payer: Self-pay | Admitting: Internal Medicine

## 2023-05-19 DIAGNOSIS — E1165 Type 2 diabetes mellitus with hyperglycemia: Secondary | ICD-10-CM

## 2023-05-20 NOTE — Telephone Encounter (Signed)
Pt had appt lauren this Friday we can send pres at visit

## 2023-05-24 ENCOUNTER — Ambulatory Visit: Payer: 59 | Admitting: Physician Assistant

## 2023-05-24 ENCOUNTER — Encounter: Payer: Self-pay | Admitting: Physician Assistant

## 2023-05-24 VITALS — BP 115/70 | HR 82 | Temp 97.6°F | Resp 16 | Ht 67.0 in | Wt 244.2 lb

## 2023-05-24 DIAGNOSIS — E1165 Type 2 diabetes mellitus with hyperglycemia: Secondary | ICD-10-CM | POA: Diagnosis not present

## 2023-05-24 DIAGNOSIS — R21 Rash and other nonspecific skin eruption: Secondary | ICD-10-CM

## 2023-05-24 DIAGNOSIS — Z23 Encounter for immunization: Secondary | ICD-10-CM

## 2023-05-24 MED ORDER — SEMAGLUTIDE (2 MG/DOSE) 8 MG/3ML ~~LOC~~ SOPN
2.0000 mg | PEN_INJECTOR | SUBCUTANEOUS | 2 refills | Status: DC
Start: 2023-05-24 — End: 2023-08-07

## 2023-05-24 MED ORDER — TRIAMCINOLONE ACETONIDE 0.1 % EX CREA: 1.0000 | TOPICAL_CREAM | Freq: Two times a day (BID) | CUTANEOUS | 0 refills | Status: DC

## 2023-07-15 ENCOUNTER — Other Ambulatory Visit: Payer: Self-pay | Admitting: Psychiatry

## 2023-07-15 DIAGNOSIS — F411 Generalized anxiety disorder: Secondary | ICD-10-CM

## 2023-07-17 ENCOUNTER — Encounter: Payer: Self-pay | Admitting: Psychiatry

## 2023-07-17 NOTE — Telephone Encounter (Signed)
Please call to schedule FU, was due last month.

## 2023-07-17 NOTE — Telephone Encounter (Signed)
Pt is scheduled 11/20

## 2023-07-19 ENCOUNTER — Ambulatory Visit: Payer: 59 | Admitting: Physician Assistant

## 2023-07-24 ENCOUNTER — Encounter: Payer: Self-pay | Admitting: Psychiatry

## 2023-07-24 ENCOUNTER — Telehealth (INDEPENDENT_AMBULATORY_CARE_PROVIDER_SITE_OTHER): Payer: 59 | Admitting: Psychiatry

## 2023-07-24 DIAGNOSIS — F411 Generalized anxiety disorder: Secondary | ICD-10-CM | POA: Diagnosis not present

## 2023-07-24 DIAGNOSIS — F39 Unspecified mood [affective] disorder: Secondary | ICD-10-CM

## 2023-07-24 DIAGNOSIS — F5101 Primary insomnia: Secondary | ICD-10-CM

## 2023-07-24 MED ORDER — DESVENLAFAXINE SUCCINATE ER 50 MG PO TB24: 50.0000 mg | ORAL_TABLET | Freq: Every day | ORAL | 1 refills | Status: AC

## 2023-07-24 MED ORDER — HYDROXYZINE PAMOATE 50 MG PO CAPS: ORAL_CAPSULE | ORAL | 1 refills | Status: DC

## 2023-07-24 MED ORDER — ALPRAZOLAM 0.25 MG PO TABS
0.2500 mg | ORAL_TABLET | Freq: Two times a day (BID) | ORAL | 4 refills | Status: AC | PRN
Start: 2023-09-03 — End: ?

## 2023-07-24 MED ORDER — PROPRANOLOL HCL 20 MG PO TABS: 20.0000 mg | ORAL_TABLET | Freq: Three times a day (TID) | ORAL | 5 refills | Status: AC | PRN

## 2023-07-24 MED ORDER — CARIPRAZINE HCL 3 MG PO CAPS: 3.0000 mg | ORAL_CAPSULE | Freq: Every day | ORAL | 1 refills | Status: DC

## 2023-07-24 MED ORDER — OXCARBAZEPINE 300 MG PO TABS: 300.0000 mg | ORAL_TABLET | Freq: Two times a day (BID) | ORAL | 1 refills | Status: AC

## 2023-07-24 MED ORDER — AUVELITY 45-105 MG PO TBCR: 1.0000 | EXTENDED_RELEASE_TABLET | Freq: Two times a day (BID) | ORAL | 1 refills | Status: DC

## 2023-07-24 MED ORDER — DESVENLAFAXINE SUCCINATE ER 100 MG PO TB24: 100.0000 mg | ORAL_TABLET | ORAL | 1 refills | Status: AC

## 2023-07-24 MED ORDER — LAMOTRIGINE 100 MG PO TABS: 100.0000 mg | ORAL_TABLET | Freq: Two times a day (BID) | ORAL | 1 refills | Status: DC

## 2023-07-24 NOTE — Progress Notes (Signed)
Ashley Stephens [id number] Oct 30, 1975 47 y.o.  Virtual Visit via Video Note  I connected with pt @ on 07/24/23 at  3:30 PM EST by a video enabled telemedicine application and verified that I am speaking with the correct person using two identifiers.   I discussed the limitations of evaluation and management by telemedicine and the availability of in person appointments. The patient expressed understanding and agreed to proceed.  I discussed the assessment and treatment plan with the patient. The patient was provided an opportunity to ask questions and all were answered. The patient agreed with the plan and demonstrated an understanding of the instructions.   The patient was advised to call back or seek an in-person evaluation if the symptoms worsen or if the condition fails to improve as anticipated.  I provided 35 minutes of non-face-to-face time during this encounter.  The patient was located at home.  The provider was located at [facility].   Corie Chiquito, PMHNP   Subjective:   Patient ID:  Ashley Stephens is a 37 y.o. (DOB 11-20-1975) female.  Chief Complaint:  Chief Complaint  Patient presents with   Anxiety   Other    Irritability    HPI [name] presents for follow-up of mood disturbance, anxiety, and insomnia. She reports that "med-wise I am ok." She reports some recent anxiety and irritation in response to stressors, to include stress with her son. She reports that she had a panic attack today after receiving a text message from him. She reports that she is having panic attacks at times. She reports increased anxiety and worry in response to stress. She reports that overall her depression has decreased and reports that she continues to experience persistent sadness. Denies manic or impulsive behavior. She reports poor sleep and describes sleeping for short intervals and then repeatedly awakening. She reports that her sleep is not  restful or restorative. She report that her energy is "not good." She reports that her motivation has been lower. Reports that her concentation has been adequate. She reports that her appetite has been lower since starting Ozempic. Denies SI.   She reports that trileptal is helpful for her anxiety and irritability, and prefers to take the first dose in the early morning and the second dose mid-day to help with these symptoms through the end of her work day.   Past medication trials: Wellbutrin Effexor Pristiq-effective Auvelity Lamictal-effective for mood Latuda Rexulti Xanax- Usually takes at night Propanolol- Effective Hydroxyzine Gabapentin- prescribed for back pain. Dizziness, headaches. Lithium Trileptal   Review of Systems:  Review of Systems  HENT:         Recent ear infection  Musculoskeletal:  Negative for gait problem.  Skin:  Negative for rash.  Neurological:  Negative for tremors.  Psychiatric/Behavioral:         Please refer to HPI    Medications: I have reviewed the patient's current medications.  Current Outpatient Medications  Medication Sig Dispense Refill   cholecalciferol (VITAMIN D3) 25 MCG (1000 UNIT) tablet Take 1,000 Units by mouth daily.     clindamycin (CLINDAGEL) 1 % gel SMARTSIG:Sparingly Topical Every Morning     Cyanocobalamin (VITAMIN B 12 PO) Take by mouth.     levothyroxine (SYNTHROID) 25 MCG tablet Take 1 tablet (25 mcg total) by mouth daily before breakfast. 90 tablet 1   omeprazole (PRILOSEC) 40 MG capsule Take 1 capsule (40 mg total) by mouth daily. (Patient taking differently: Take 40 mg by mouth as  needed.) 30 capsule 3   rosuvastatin (CRESTOR) 5 MG tablet Take 1 tablet (5 mg total) by mouth daily. 30 tablet 5   Semaglutide, 2 MG/DOSE, 8 MG/3ML SOPN Inject 2 mg as directed once a week. 3 mL 2   spironolactone (ALDACTONE) 100 MG tablet Take 100 mg by mouth 2 (two) times daily.     triamcinolone cream (KENALOG) 0.1 % Apply 1 Application  topically 2 (two) times daily. (Patient taking differently: Apply 1 Application topically as needed.) 30 g 0   Adapalene-Benzoyl Peroxide 0.3-2.5 % GEL SMARTSIG:Topical Every Evening     [START ON 09/03/2023] ALPRAZolam (XANAX) 0.25 MG tablet Take 1 tablet (0.25 mg total) by mouth 2 (two) times daily as needed. for anxiety 45 tablet 4   cariprazine (VRAYLAR) 3 MG capsule Take 1 capsule (3 mg total) by mouth daily. 90 capsule 1   desvenlafaxine (PRISTIQ) 100 MG 24 hr tablet Take 1 tablet (100 mg total) by mouth every morning. 90 tablet 1   desvenlafaxine (PRISTIQ) 50 MG 24 hr tablet Take 1 tablet (50 mg total) by mouth at bedtime. 90 tablet 1   Dextromethorphan-buPROPion ER (AUVELITY) 45-105 MG TBCR Take 1 tablet by mouth 2 (two) times daily. 180 tablet 1   hydrOXYzine (VISTARIL) 50 MG capsule Take 1 capsule by mouth three times daily as needed 270 capsule 1   lamoTRIgine (LAMICTAL) 100 MG tablet Take 1 tablet (100 mg total) by mouth 2 (two) times daily. 180 tablet 1   mometasone (ELOCON) 0.1 % cream Apply topically 2 (two) times daily.     Oxcarbazepine (TRILEPTAL) 300 MG tablet Take 1 tablet (300 mg total) by mouth 2 (two) times daily. 180 tablet 1   propranolol (INDERAL) 20 MG tablet Take 1 tablet (20 mg total) by mouth 3 (three) times daily as needed. 90 tablet 5   No current facility-administered medications for this visit.    Medication Side Effects: None  Allergies: No Known Allergies  Past Medical History:  Diagnosis Date   Anxiety    Depression    Diabetes mellitus without complication (HCC)    Gastric ulcer    GERD (gastroesophageal reflux disease)    Hypertension    Osteoarthritis    Thyroid disorder     Family History  Problem Relation Age of Onset   Breast cancer Maternal Grandmother    Diabetes Maternal Grandmother    Heart disease Maternal Grandmother    Hypertension Maternal Grandmother    Heart disease Brother    Hypertension Brother    Hyperlipidemia Brother     Diabetes Maternal Aunt    Heart disease Maternal Aunt    Hypertension Maternal Aunt    Hyperlipidemia Maternal Aunt    Diabetes Maternal Aunt    Heart disease Maternal Aunt    Hypertension Maternal Aunt    Hyperlipidemia Maternal Aunt    Diabetes Maternal Aunt    Heart disease Maternal Aunt    Hypertension Maternal Aunt    Hyperlipidemia Maternal Aunt     Social History   Socioeconomic History   Marital status: Single    Spouse name: Not on file   Number of children: 1   Years of education: Not on file   Highest education level: Not on file  Occupational History   Occupation: Education officer, community  Tobacco Use   Smoking status: Never   Smokeless tobacco: Never  Vaping Use   Vaping status: Never Used  Substance and Sexual Activity   Alcohol use: No  Alcohol/week: 0.0 standard drinks of alcohol   Drug use: No   Sexual activity: Yes    Birth control/protection: Surgical  Other Topics Concern   Not on file  Social History Narrative   Not on file   Social Determinants of Health   Financial Resource Strain: Low Risk  (03/13/2021)   Received from [facility], [facility]   Overall Financial Resource Strain (CARDIA)    Difficulty of Paying Living Expenses: Not hard at all  Food Insecurity: No Food Insecurity (03/13/2021)   Received from [facility], [facility]   Hunger Vital Sign    Worried About Running Out of Food in the Last Year: Never true    Ran Out of Food in the Last Year: Never true  Transportation Needs: No Transportation Needs (03/13/2021)   Received from [facility], [facility]   [facility] - Transportation    Lack of Transportation (Medical): No    Lack of Transportation (Non-Medical): No  Physical Activity: Not on file  Stress: Not on file  Social Connections: Not on file  Intimate Partner Violence: Not on file    Past Medical History, Surgical history, Social history, and Family history were reviewed and updated as appropriate.    Please see review of systems for further details on the patient's review from today.   Objective:   Physical Exam:  There were no vitals taken for this visit.  Physical Exam Neurological:     Mental Status: She is alert and oriented to person, place, and time.     Cranial Nerves: No dysarthria.  Psychiatric:        Attention and Perception: Attention and perception normal.        Mood and Affect: Mood is anxious.        Speech: Speech normal.        Behavior: Behavior is cooperative.        Thought Content: Thought content normal. Thought content is not paranoid or delusional. Thought content does not include homicidal or suicidal ideation. Thought content does not include homicidal or suicidal plan.        Cognition and Memory: Cognition and memory normal.        Judgment: Judgment normal.     Comments: Insight intact Mood is mildly dysphoric     Lab Review:     Component Value Date/Time   NA 134 (L) 10/22/2022 1858   NA 142 01/25/2022 0947   NA 142 12/03/2011 1059   K 4.3 10/22/2022 1858   K 3.4 (L) 12/03/2011 1059   CL 101 10/22/2022 1858   CL 107 12/03/2011 1059   CO2 18 (L) 10/22/2022 1858   CO2 27 12/03/2011 1059   GLUCOSE 162 (H) 10/22/2022 1858   GLUCOSE 66 12/03/2011 1059   BUN 12 10/22/2022 1858   BUN 7 01/25/2022 0947   BUN 4 (L) 12/03/2011 1059   CREATININE 1.21 (H) 10/22/2022 1858   CREATININE 0.82 12/03/2011 1059   CALCIUM 10.5 (H) 10/22/2022 1858   CALCIUM 8.5 12/03/2011 1059   PROT 8.7 (H) 10/22/2022 1858   PROT 6.5 01/25/2022 0947   ALBUMIN 5.2 (H) 10/22/2022 1858   ALBUMIN 4.3 01/25/2022 0947   AST 26 10/22/2022 1858   ALT 22 10/22/2022 1858   ALKPHOS 90 10/22/2022 1858   BILITOT 0.3 10/22/2022 1858   BILITOT 0.2 01/25/2022 0947   GFRNONAA 55 (L) 10/22/2022 1858   GFRNONAA >60 12/03/2011 1059   GFRAA 78 07/10/2019  4010   GFRAA >60 12/03/2011 1059       Component Value Date/Time   WBC 10.3 10/22/2022 1858   RBC 5.80 (H) 10/22/2022  1858   HGB 17.5 (H) 10/22/2022 1858   HGB 14.6 01/25/2022 0947   HCT 50.0 (H) 10/22/2022 1858   HCT 42.0 01/25/2022 0947   PLT 374 10/22/2022 1858   PLT 294 01/25/2022 0947   MCV 86.2 10/22/2022 1858   MCV 87 01/25/2022 0947   MCH 30.2 10/22/2022 1858   MCHC 35.0 10/22/2022 1858   RDW 12.9 10/22/2022 1858   RDW 12.8 01/25/2022 0947   LYMPHSABS 1.7 10/22/2022 1858   LYMPHSABS 1.6 01/25/2022 0947   MONOABS 0.5 10/22/2022 1858   EOSABS 0.1 10/22/2022 1858   EOSABS 0.1 01/25/2022 0947   BASOSABS 0.0 10/22/2022 1858   BASOSABS 0.0 01/25/2022 0947    No results found for: "POCLITH", "LITHIUM"   No results found for: "PHENYTOIN", "PHENOBARB", "VALPROATE", "CBMZ"   .res Assessment: Plan:    35 minutes spent dedicated to the care of this patient on the date of this encounter to include pre-visit review of records, ordering of medication, post visit documentation, and face-to-face time with the patient discussing how recent stressors with son has increased her anxiety and irritability.She reports that she feels her medications are helping her cope with a difficult situation and would like to continue current medications without changes at this time. She reports that cognitive issues have improved since Lamictal was decreased. Will continue Lamictal 100 mg twice daily for mood symptoms.  Will continue Pristiq 150 mg daily for depression and anxiety.  Continue Oxcarbazepine 300 mg twice daily for anxiety and mood stabilization. Continue Vraylar 3 mg daily for mood symptoms.  Will continue Auvelity 45-105 mg one tablet twice daily for depression. Continue Alprazolam prn anxiety.  Continue Propranolol 30 mg three times daily as needed for anxiety.  Continue Hydroxyzine 50 mg three times daily as needed for anxiety.  Pt to follow-up with this provider in 4-6 weeks or sooner if clinically indicated.  Recommend continuing intermittent FMLA and accommodations for pt to work from home and to have  a scheduled lunch break.  Patient advised to contact office with any questions, adverse effects, or acute worsening in signs and symptoms.   Brityn was seen today for anxiety and other.  Diagnoses and all orders for this visit:  Generalized anxiety disorder -     ALPRAZolam (XANAX) 0.25 MG tablet; Take 1 tablet (0.25 mg total) by mouth 2 (two) times daily as needed. for anxiety -     desvenlafaxine (PRISTIQ) 50 MG 24 hr tablet; Take 1 tablet (50 mg total) by mouth at bedtime. -     desvenlafaxine (PRISTIQ) 100 MG 24 hr tablet; Take 1 tablet (100 mg total) by mouth every morning. -     Oxcarbazepine (TRILEPTAL) 300 MG tablet; Take 1 tablet (300 mg total) by mouth 2 (two) times daily. -     propranolol (INDERAL) 20 MG tablet; Take 1 tablet (20 mg total) by mouth 3 (three) times daily as needed.  Mood disorder (HCC) -     cariprazine (VRAYLAR) 3 MG capsule; Take 1 capsule (3 mg total) by mouth daily. -     desvenlafaxine (PRISTIQ) 50 MG 24 hr tablet; Take 1 tablet (50 mg total) by mouth at bedtime. -     desvenlafaxine (PRISTIQ) 100 MG 24 hr tablet; Take 1 tablet (100 mg total) by mouth every morning. -  Dextromethorphan-buPROPion ER (AUVELITY) 45-105 MG TBCR; Take 1 tablet by mouth 2 (two) times daily. -     lamoTRIgine (LAMICTAL) 100 MG tablet; Take 1 tablet (100 mg total) by mouth 2 (two) times daily. -     Oxcarbazepine (TRILEPTAL) 300 MG tablet; Take 1 tablet (300 mg total) by mouth 2 (two) times daily.  Primary insomnia -     hydrOXYzine (VISTARIL) 50 MG capsule; Take 1 capsule by mouth three times daily as needed -     Oxcarbazepine (TRILEPTAL) 300 MG tablet; Take 1 tablet (300 mg total) by mouth 2 (two) times daily.     Please see After Visit Summary for patient specific instructions.  Future Appointments  Date Time Provider Department Center  07/25/2023  8:20 AM McDonough, Salomon Fick, PA-C [facility] None  02/06/2024  9:00 AM McDonough, Salomon Fick, PA-C [facility] None    No  orders of the defined types were placed in this encounter.     -------------------------------

## 2023-07-25 ENCOUNTER — Encounter: Payer: Self-pay | Admitting: Physician Assistant

## 2023-07-25 ENCOUNTER — Ambulatory Visit: Payer: 59 | Admitting: Physician Assistant

## 2023-07-25 VITALS — BP 103/80 | HR 77 | Temp 98.4°F | Resp 16 | Ht 67.0 in | Wt 239.8 lb

## 2023-07-25 DIAGNOSIS — E559 Vitamin D deficiency, unspecified: Secondary | ICD-10-CM | POA: Diagnosis not present

## 2023-07-25 DIAGNOSIS — E538 Deficiency of other specified B group vitamins: Secondary | ICD-10-CM

## 2023-07-25 DIAGNOSIS — R5383 Other fatigue: Secondary | ICD-10-CM

## 2023-07-25 DIAGNOSIS — E039 Hypothyroidism, unspecified: Secondary | ICD-10-CM | POA: Diagnosis not present

## 2023-07-25 DIAGNOSIS — G4710 Hypersomnia, unspecified: Secondary | ICD-10-CM

## 2023-07-25 DIAGNOSIS — E782 Mixed hyperlipidemia: Secondary | ICD-10-CM | POA: Diagnosis not present

## 2023-07-25 DIAGNOSIS — E1165 Type 2 diabetes mellitus with hyperglycemia: Secondary | ICD-10-CM

## 2023-07-25 DIAGNOSIS — E669 Obesity, unspecified: Secondary | ICD-10-CM | POA: Diagnosis not present

## 2023-07-25 NOTE — Progress Notes (Signed)
[facility] [address] [place], [place] [address]  Internal MEDICINE  Office Visit Note  Patient Name: Ashley Stephens  [id number]  [id number]  Date of Service: 07/25/2023  Chief Complaint  Patient presents with   Follow-up   Depression   Gastroesophageal Reflux   Diabetes   Hypertension   Quality Metric Gaps    Colonoscopy    HPI Pt is here for routine follow up -Down 5lbs since last visit, tolerating ozempic well -BP on the low side, has not had much water, is also on high dose spironolactone via derm office and discussed may need to cut back on this if BP remains low. Will check labs -[facility], [facility]. Did also have a little back pain and was given muscle relaxer when needed -Pt has a long history of loud snoring and does feel fatigued. States a history of OSA many years ago but did not start on CPAP  EPWORTH SLEEPINESS SCALE:  Scale:  (0)= no chance of dozing; (1)= slight chance of dozing; (2)= moderate chance of dozing; (3)= high chance of dozing  Chance  Situtation    Sitting and reading: 0    Watching TV: 2    Sitting Inactive in public: 0    As a passenger in car: 0      Lying down to rest: 2    Sitting and talking: 0    Sitting quielty after lunch: 0    In a car, stopped in traffic: 0   TOTAL SCORE:   4 out of 24   Current Medication: Outpatient Encounter Medications as of 07/25/2023  Medication Sig   Adapalene-Benzoyl Peroxide 0.3-2.5 % GEL SMARTSIG:Topical Every Evening   [START ON 09/03/2023] ALPRAZolam (XANAX) 0.25 MG tablet Take 1 tablet (0.25 mg total) by mouth 2 (two) times daily as needed. for anxiety   cariprazine (VRAYLAR) 3 MG capsule Take 1 capsule (3 mg total) by mouth daily.   cholecalciferol (VITAMIN D3) 25 MCG (1000 UNIT) tablet Take 1,000 Units by mouth daily.   clindamycin (CLINDAGEL) 1 % gel SMARTSIG:Sparingly Topical Every Morning   Cyanocobalamin (VITAMIN B 12 PO)  Take by mouth.   desvenlafaxine (PRISTIQ) 100 MG 24 hr tablet Take 1 tablet (100 mg total) by mouth every morning.   desvenlafaxine (PRISTIQ) 50 MG 24 hr tablet Take 1 tablet (50 mg total) by mouth at bedtime.   Dextromethorphan-buPROPion ER (AUVELITY) 45-105 MG TBCR Take 1 tablet by mouth 2 (two) times daily.   hydrOXYzine (VISTARIL) 50 MG capsule Take 1 capsule by mouth three times daily as needed   lamoTRIgine (LAMICTAL) 100 MG tablet Take 1 tablet (100 mg total) by mouth 2 (two) times daily.   levothyroxine (SYNTHROID) 25 MCG tablet Take 1 tablet (25 mcg total) by mouth daily before breakfast.   mometasone (ELOCON) 0.1 % cream Apply topically 2 (two) times daily.   omeprazole (PRILOSEC) 40 MG capsule Take 1 capsule (40 mg total) by mouth daily. (Patient taking differently: Take 40 mg by mouth as needed.)   Oxcarbazepine (TRILEPTAL) 300 MG tablet Take 1 tablet (300 mg total) by mouth 2 (two) times daily.   propranolol (INDERAL) 20 MG tablet Take 1 tablet (20 mg total) by mouth 3 (three) times daily as needed.   rosuvastatin (CRESTOR) 5 MG tablet Take 1 tablet (5 mg total) by mouth daily.   Semaglutide, 2 MG/DOSE, 8 MG/3ML SOPN Inject 2 mg as directed once a week.   spironolactone (ALDACTONE)  100 MG tablet Take 100 mg by mouth 2 (two) times daily.   triamcinolone cream (KENALOG) 0.1 % Apply 1 Application topically 2 (two) times daily. (Patient taking differently: Apply 1 Application topically as needed.)   No facility-administered encounter medications on file as of 07/25/2023.    Surgical History: Past Surgical History:  Procedure Laterality Date   ABDOMINAL HYSTERECTOMY  2013   BARIATRIC SURGERY     BREAST BIOPSY  1995   COLONOSCOPY WITH PROPOFOL N/A 10/28/2020   Procedure: COLONOSCOPY WITH PROPOFOL;  Surgeon: Wyline Mood, MD;  Location: [facility] ENDOSCOPY;  Service: Gastroenterology;  Laterality: N/A;   GASTRIC BYPASS  2012   TUBAL LIGATION  1996   tummy tuck  05/27/2020    Medical  History: Past Medical History:  Diagnosis Date   Anxiety    Depression    Diabetes mellitus without complication (HCC)    Gastric ulcer    GERD (gastroesophageal reflux disease)    Hypertension    Osteoarthritis    Thyroid disorder     Family History: Family History  Problem Relation Age of Onset   Breast cancer Maternal Grandmother    Diabetes Maternal Grandmother    Heart disease Maternal Grandmother    Hypertension Maternal Grandmother    Heart disease Brother    Hypertension Brother    Hyperlipidemia Brother    Diabetes Maternal Aunt    Heart disease Maternal Aunt    Hypertension Maternal Aunt    Hyperlipidemia Maternal Aunt    Diabetes Maternal Aunt    Heart disease Maternal Aunt    Hypertension Maternal Aunt    Hyperlipidemia Maternal Aunt    Diabetes Maternal Aunt    Heart disease Maternal Aunt    Hypertension Maternal Aunt    Hyperlipidemia Maternal Aunt     Social History   Socioeconomic History   Marital status: Single    Spouse name: Not on file   Number of children: 1   Years of education: Not on file   Highest education level: Not on file  Occupational History   Occupation: Education officer, community  Tobacco Use   Smoking status: Never   Smokeless tobacco: Never  Vaping Use   Vaping status: Never Used  Substance and Sexual Activity   Alcohol use: No    Alcohol/week: 0.0 standard drinks of alcohol   Drug use: No   Sexual activity: Yes    Birth control/protection: Surgical  Other Topics Concern   Not on file  Social History Narrative   Not on file   Social Determinants of Health   Financial Resource Strain: Low Risk  (03/13/2021)   Received from [facility], [facility]   Overall Financial Resource Strain (CARDIA)    Difficulty of Paying Living Expenses: Not hard at all  Food Insecurity: No Food Insecurity (03/13/2021)   [facility], [facility]   Hunger Vital Sign    Worried About Running Out of Food in the Last  Year: Never true    Ran Out of Food in the Last Year: Never true  Transportation Needs: No Transportation Needs (03/13/2021)   Received from [facility], [facility]   PRAPARE - Transportation    Lack of Transportation (Medical): No    Lack of Transportation (Non-Medical): No  Physical Activity: Not on file  Stress: Not on file  Social Connections: Not on file  Intimate Partner Violence: Not on file      Review of Systems  Constitutional:  Positive for fatigue. Negative for chills and unexpected weight change.  HENT:  Negative for congestion, postnasal drip, rhinorrhea, sneezing and sore throat.   Eyes:  Negative for redness.  Respiratory: Negative.  Negative for cough, chest tightness, shortness of breath and wheezing.   Cardiovascular: Negative.  Negative for chest pain and palpitations.  Gastrointestinal:  Negative for abdominal pain, constipation, diarrhea, nausea and vomiting.  Genitourinary:  Negative for dysuria and frequency.  Musculoskeletal:  Negative for arthralgias, back pain, joint swelling and neck pain.  Skin:  Negative for rash.  Neurological: Negative.  Negative for tremors and numbness.  Hematological:  Negative for adenopathy. Does not bruise/bleed easily.  Psychiatric/Behavioral:  Negative for behavioral problems (Depression), sleep disturbance and suicidal ideas. The patient is not nervous/anxious.     Vital Signs: BP 103/80   Pulse 77   Temp 98.4 F (36.9 C)   Resp 16   Ht 5\' 7"  (1.702 m)   Wt 239 lb 12.8 oz (108.8 kg)   SpO2 97%   BMI 37.56 kg/m    Physical Exam Vitals and nursing note reviewed.  Constitutional:      General: She is not in acute distress.    Appearance: Normal appearance. She is obese. She is not ill-appearing.  HENT:     Head: Normocephalic and atraumatic.  Eyes:     Pupils: Pupils are equal, round, and reactive to light.  Cardiovascular:     Rate and Rhythm: Normal rate and regular rhythm.  Pulmonary:     Effort:  Pulmonary effort is normal. No respiratory distress.  Skin:    General: Skin is warm and dry.  Neurological:     Mental Status: She is alert and oriented to person, place, and time.  Psychiatric:        Mood and Affect: Mood normal.        Behavior: Behavior normal.        Assessment/Plan: 1. Hypersomnia Pt with history of OSA, loud snoring, daytime fatigue, and elevated BMI, will order updated sleep study - PSG SLEEP STUDY; Future  2. Type 2 diabetes mellitus with hyperglycemia, without long-term current use of insulin (HCC) Doing well on ozempic and would like to continue  3. Mixed hyperlipidemia - Lipid Panel With LDL/HDL Ratio  4. Acquired hypothyroidism - TSH + free T4  5. B12 deficiency - B12 and Folate Panel  6. Vitamin D deficiency - VITAMIN D 25 Hydroxy (Vit-D Deficiency, Fractures)  7. Other fatigue - Lipid Panel With LDL/HDL Ratio - CBC w/Diff/Platelet - Comprehensive metabolic panel - TSH + free T4 - B12 and Folate Panel - VITAMIN D 25 Hydroxy (Vit-D Deficiency, Fractures) - Fe+TIBC+Fer  8. Obesity (BMI 30-39.9) Down 5lbs since last visit, will order PSG   General Counseling: Ashley Stephens verbalizes understanding of the findings of todays visit and agrees with plan of treatment. I have discussed any further diagnostic evaluation that may be needed or ordered today. We also reviewed her medications today. she has been encouraged to call the office with any questions or concerns that should arise related to todays visit.    Orders Placed This Encounter  Procedures   Lipid Panel With LDL/HDL Ratio   CBC w/Diff/Platelet   Comprehensive metabolic panel   TSH + free T4   B12 and Folate Panel   VITAMIN D 25 Hydroxy (Vit-D Deficiency, Fractures)   Fe+TIBC+Fer   PSG SLEEP STUDY    No orders of the defined types were placed in this encounter.   This  patient was seen by Lynn Ito, PA-C in collaboration with Dr. Beverely Risen as a part of collaborative  care agreement.   Total time spent:30 Minutes Time spent includes review of chart, medications, test results, and follow up plan with the patient.      Dr Lyndon Code Internal medicine

## 2023-07-30 ENCOUNTER — Telehealth: Payer: Self-pay | Admitting: Physician Assistant

## 2023-07-30 ENCOUNTER — Other Ambulatory Visit: Payer: Self-pay

## 2023-07-30 MED ORDER — FLUCONAZOLE 150 MG PO TABS: ORAL_TABLET | ORAL | 0 refills | Status: DC

## 2023-07-30 NOTE — Telephone Encounter (Signed)
SS order faxed to [facility]; [phone]

## 2023-08-07 ENCOUNTER — Other Ambulatory Visit: Payer: Self-pay | Admitting: Physician Assistant

## 2023-08-07 DIAGNOSIS — E1165 Type 2 diabetes mellitus with hyperglycemia: Secondary | ICD-10-CM

## 2023-08-11 ENCOUNTER — Other Ambulatory Visit: Payer: Self-pay | Admitting: Nurse Practitioner

## 2023-08-11 ENCOUNTER — Other Ambulatory Visit: Payer: Self-pay | Admitting: Physician Assistant

## 2023-08-11 DIAGNOSIS — K219 Gastro-esophageal reflux disease without esophagitis: Secondary | ICD-10-CM

## 2023-08-11 DIAGNOSIS — E782 Mixed hyperlipidemia: Secondary | ICD-10-CM

## 2023-09-02 ENCOUNTER — Other Ambulatory Visit: Payer: Self-pay | Admitting: Physician Assistant

## 2023-09-02 DIAGNOSIS — E1165 Type 2 diabetes mellitus with hyperglycemia: Secondary | ICD-10-CM

## 2023-09-03 ENCOUNTER — Other Ambulatory Visit: Payer: Self-pay | Admitting: Physician Assistant

## 2023-09-03 DIAGNOSIS — E1165 Type 2 diabetes mellitus with hyperglycemia: Secondary | ICD-10-CM

## 2023-09-18 ENCOUNTER — Telehealth: Payer: Self-pay | Admitting: Physician Assistant

## 2023-09-18 NOTE — Telephone Encounter (Signed)
 Lvm to r/s 09/26/23 appointment since SS has not been done. Per Ammon Bales w/ [facility], they are still waiting for insurance approval-Toni

## 2023-09-19 ENCOUNTER — Telehealth: Payer: Self-pay | Admitting: Physician Assistant

## 2023-09-19 NOTE — Telephone Encounter (Signed)
Home SS order faxed to [facility]; [phone]

## 2023-09-26 ENCOUNTER — Ambulatory Visit: Payer: 59 | Admitting: Physician Assistant

## 2023-09-29 ENCOUNTER — Encounter: Payer: Self-pay | Admitting: Emergency Medicine

## 2023-09-29 ENCOUNTER — Ambulatory Visit
Admission: EM | Admit: 2023-09-29 | Discharge: 2023-09-29 | Disposition: A | Discharge status: Home / Self Care | Payer: 59 | Attending: Family Medicine | Admitting: Family Medicine

## 2023-09-29 DIAGNOSIS — J101 Influenza due to other identified influenza virus with other respiratory manifestations: Secondary | ICD-10-CM

## 2023-09-29 LAB — RESP PANEL BY RT-PCR (FLU A&B, COVID) ARPGX2
Influenza A by PCR: POSITIVE — AB
Influenza B by PCR: NEGATIVE
SARS Coronavirus 2 by RT PCR: NEGATIVE

## 2023-09-29 MED ORDER — OSELTAMIVIR PHOSPHATE 75 MG PO CAPS: 75.0000 mg | ORAL_CAPSULE | Freq: Two times a day (BID) | ORAL | 0 refills | Status: DC

## 2023-09-29 NOTE — ED Provider Notes (Signed)
[facility]    CSN: [id number] Arrival date & time: 09/29/23  0856      History   Chief Complaint Chief Complaint  Patient presents with   Generalized Body Aches   Sore Throat   Cough    HPI Ashley Stephens is a 48 y.o. female.   HPI  History obtained from the patient. Ashley Stephens presents for cough, sore throat, body aches, hot flashes, chills, headache that started yesterday. No vomiting, diarrhea.  Took Tylenol and Bayer aspirin this morning.  Her god-daughter has the flu.     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   Depression with anxiety 03/12/2021   Major depressive disorder, recurrent episode, severe (HCC) 01/27/2020   DDD (degenerative disc disease), lumbar 12/16/2019   B12 deficiency 12/16/2019   Encounter for general adult medical examination with abnormal findings 07/12/2019   Borderline diabetes 07/12/2019   Gastroesophageal reflux disease without esophagitis 07/12/2019   Encounter for breast cancer screening other than mammogram 07/12/2019   Dysuria 07/12/2019   Lumbar pain 11/06/2018   Mild mood disorder (HCC) 06/07/2018   Generalized anxiety disorder 06/07/2018   Type 2 diabetes mellitus with hyperglycemia, without long-term current use of insulin (HCC) 12/14/2017   Impaired fasting glucose 12/14/2017   Fatigue 12/14/2017   Abnormal weight gain 12/14/2017   Vitamin D deficiency 12/14/2017   Acquired hypothyroidism 12/14/2017    Past Surgical History:  Procedure Laterality Date   ABDOMINAL  HYSTERECTOMY  2013   BARIATRIC SURGERY     BREAST BIOPSY  1995   COLONOSCOPY WITH PROPOFOL N/A 10/28/2020   Procedure: COLONOSCOPY WITH PROPOFOL;  Surgeon: Wyline Mood, MD;  Location: [facility] ENDOSCOPY;  Service: Gastroenterology;  Laterality: N/A;   GASTRIC BYPASS  2012   TUBAL LIGATION  1996   tummy tuck  05/27/2020    OB History     Gravida  2   Para  2   Term      Preterm      AB      Living  2      SAB      IAB      Ectopic      Multiple      Live Births           Obstetric Comments  1st Menstrual Cycle:  10 1st Pregnancy:  36          Home Medications    Prior to Admission medications   Medication Sig Start Date End Date Taking? Authorizing Provider  oseltamivir (TAMIFLU) 75 MG capsule Take 1 capsule (75 mg total) by mouth every 12 (twelve) hours. 09/29/23  Yes Tycho Cheramie, DO  Adapalene-Benzoyl Peroxide 0.3-2.5 % GEL SMARTSIG:Topical Every Evening    [provider]  ALPRAZolam (XANAX) 0.25 MG tablet Take 1 tablet (0.25 mg total) by mouth 2 (two) times daily as needed. for anxiety 09/03/23   Corie Chiquito, PMHNP  cariprazine (VRAYLAR) 3 MG capsule Take 1 capsule (3 mg total) by mouth daily. 07/24/23   Montez Morita,  Shanda Bumps, PMHNP  cholecalciferol (VITAMIN D3) 25 MCG (1000 UNIT) tablet Take 1,000 Units by mouth daily.    [provider]  clindamycin (CLINDAGEL) 1 % gel SMARTSIG:Sparingly Topical Every Morning    [provider]  Cyanocobalamin (VITAMIN B 12 PO) Take by mouth.    [provider]  desvenlafaxine (PRISTIQ) 100 MG 24 hr tablet Take 1 tablet (100 mg total) by mouth every morning. 07/24/23   Corie Chiquito, PMHNP  desvenlafaxine (PRISTIQ) 50 MG 24 hr tablet Take 1 tablet (50 mg total) by mouth at bedtime. 07/24/23 01/20/24  Corie Chiquito, PMHNP  Dextromethorphan-buPROPion ER (AUVELITY) 45-105 MG TBCR Take 1 tablet by mouth 2 (two) times daily. 07/24/23 10/22/23  Corie Chiquito, PMHNP  fluconazole (DIFLUCAN)  150 MG tablet Repeat in 3 days if symptoms have not improved. 07/30/23   McDonough, Salomon Fick, PA-C  hydrOXYzine (VISTARIL) 50 MG capsule Take 1 capsule by mouth three times daily as needed 07/24/23   Corie Chiquito, PMHNP  lamoTRIgine (LAMICTAL) 100 MG tablet Take 1 tablet (100 mg total) by mouth 2 (two) times daily. 07/24/23   Corie Chiquito, PMHNP  levothyroxine (SYNTHROID) 25 MCG tablet Take 1 tablet (25 mcg total) by mouth daily before breakfast. 11/12/22   McDonough, Salomon Fick, PA-C  mometasone (ELOCON) 0.1 % cream Apply topically 2 (two) times daily. 09/25/22   [provider]  omeprazole (PRILOSEC) 40 MG capsule Take 1 capsule (40 mg total) by mouth daily. 08/11/23   Lyndon Code, MD  Oxcarbazepine (TRILEPTAL) 300 MG tablet Take 1 tablet (300 mg total) by mouth 2 (two) times daily. 07/24/23 10/22/23  Corie Chiquito, PMHNP  propranolol (INDERAL) 20 MG tablet Take 1 tablet (20 mg total) by mouth 3 (three) times daily as needed. 07/24/23   Corie Chiquito, PMHNP  rosuvastatin (CRESTOR) 5 MG tablet Take 1 tablet by mouth once daily 08/11/23   Lyndon Code, MD  Semaglutide, 2 MG/DOSE, (OZEMPIC, 2 MG/DOSE,) 8 MG/3ML SOPN INJECT 2 MG SUBCUTANEOUSLY  ONCE A WEEK 09/03/23   McDonough, Salomon Fick, PA-C  spironolactone (ALDACTONE) 100 MG tablet Take 100 mg by mouth 2 (two) times daily. 10/06/21   [provider]  triamcinolone cream (KENALOG) 0.1 % Apply 1 Application topically 2 (two) times daily. Patient taking differently: Apply 1 Application topically as needed. 05/24/23   McDonough, Salomon Fick, PA-C    Family History Family History  Problem Relation Age of Onset   Breast cancer Maternal Grandmother    Diabetes Maternal Grandmother    Heart disease Maternal Grandmother    Hypertension Maternal Grandmother    Heart disease Brother    Hypertension Brother    Hyperlipidemia Brother    Diabetes Maternal Aunt    Heart disease Maternal Aunt    Hypertension Maternal Aunt     Hyperlipidemia Maternal Aunt    Diabetes Maternal Aunt    Heart disease Maternal Aunt    Hypertension Maternal Aunt    Hyperlipidemia Maternal Aunt    Diabetes Maternal Aunt    Heart disease Maternal Aunt    Hypertension Maternal Aunt    Hyperlipidemia Maternal Aunt     Social History Social History   Tobacco Use   Smoking status: Never   Smokeless tobacco: Never  Vaping Use   Vaping status: Never Used  Substance Use Topics   Alcohol use: No    Alcohol/week: 0.0 standard drinks of alcohol   Drug use: No     Allergies   Patient has no known allergies.  Review of Systems Review of Systems: negative unless otherwise stated in HPI.      Physical Exam Triage Vital Signs ED Triage Vitals  Encounter Vitals Group     BP 09/29/23 0953 114/73     Systolic BP Percentile --      Diastolic BP Percentile --      Pulse Rate 09/29/23 0953 88     Resp 09/29/23 0953 16     Temp 09/29/23 0953 99.2 F (37.3 C)     Temp Source 09/29/23 0953 Oral     SpO2 09/29/23 0953 98 %     Weight 09/29/23 0951 239 lb 13.8 oz (108.8 kg)     Height 09/29/23 0951 5\' 7"  (1.702 m)     Head Circumference --      Peak Flow --      Pain Score 09/29/23 0950 8     Pain Loc --      Pain Education --      Exclude from Growth Chart --    No data found.  Updated Vital Signs BP 114/73 (BP Location: Left Arm)   Pulse 88   Temp 99.2 F (37.3 C) (Oral)   Resp 16   Ht 5\' 7"  (1.702 m)   Wt 108.8 kg   SpO2 98%   BMI 37.57 kg/m   Visual Acuity Right Eye Distance:   Left Eye Distance:   Bilateral Distance:    Right Eye Near:   Left Eye Near:    Bilateral Near:     Physical Exam GEN:     alert, non-toxic appearing female in no distress    HENT:  mucus membranes moist, oropharyngeal without lesions or erythema, no tonsillar hypertrophy or exudates, clear nasal discharge, bilateral TM normal EYES:   pupils equal and reactive, no scleral injection or discharge NECK:  normal ROM, no  lymphadenopathy RESP:  no increased work of breathing, clear to auscultation bilaterally CVS:   regular rate and rhythm Skin:   warm and dry    UC Treatments / Results  Labs (all labs ordered are listed, but only abnormal results are displayed) Labs Reviewed  RESP PANEL BY RT-PCR (FLU A&B, COVID) ARPGX2 - Abnormal; Notable for the following components:      Result Value   Influenza A by PCR POSITIVE (*)    All other components within normal limits    EKG   Radiology No results found.  Procedures Procedures (including critical care time)  Medications Ordered in UC Medications - No data to display  Initial Impression / Assessment and Plan / UC Course  I have reviewed the triage vital signs and the nursing notes.  Pertinent labs & imaging results that were available during my care of the patient were reviewed by me and considered in my medical decision making (see chart for details).       Pt is a 48 y.o. female who presents for 1 day of respiratory symptoms. [place] is afebrile here though had recent antipyretics. Satting well on room air. Overall pt is non-toxic appearing, well hydrated, without respiratory distress. Pulmonary exam is unremarkable.  COVID and influenza panel obtained and was influenza A positive.  Tamiflu risk and benefits discussed and she is agreeable with this medication.  Tamiflu prescription sent to her preferred pharmacy.  Discussed symptomatic treatment. Typical duration of symptoms discussed.   Return and ED precautions given and voiced understanding. Discussed MDM, treatment plan and plan for follow-up with patient who agrees with plan.  Final Clinical Impressions(s) / UC Diagnoses   Final diagnoses:  Influenza A     Discharge Instructions      You have influenza A.  Tamiflu was prescribed.  Your symptoms will gradually improve over the next 7 to 10 days.  The cough may last about 3 weeks.   Take ibuprofen 600 mg with Tylenol 1000 mg  for fever, headache or body aches.   For cough: You can also use guaifenesin and dextromethorphan for cough. You can use a humidifier for chest congestion and cough.  If you don't have a humidifier, you can sit in the bathroom with the hot shower running.      For sore throat: try warm salt water gargles, Mucinex sore throat cough drops or cepacol lozenges, throat spray, warm tea or water with lemon/honey, popsicles or ice, or OTC cold relief medicine for throat discomfort. You can also purchase chloraseptic spray at the pharmacy or dollar store.   For congestion: take a daily anti-histamine like Zyrtec, Claritin, and a oral decongestant, such as pseudoephedrine.  You can also use Flonase 1-2 sprays in each nostril daily. Afrin is also a good option, if you do not have high blood pressure.    It is important to stay hydrated: drink plenty of fluids (water, gatorade/powerade/pedialyte, juices, or teas) to keep your throat moisturized and help further relieve irritation/discomfort.    Return or go to the Emergency Department if symptoms worsen or do not improve in the next few days      ED Prescriptions     Medication Sig Dispense Auth. Provider   oseltamivir (TAMIFLU) 75 MG capsule Take 1 capsule (75 mg total) by mouth every 12 (twelve) hours. 10 capsule Katha Cabal, DO      PDMP not reviewed this encounter.   Katha Cabal, DO 09/29/23 1214

## 2023-09-29 NOTE — ED Triage Notes (Signed)
Patient reports cough, sore throat, bodyaches, and headaches that started yesterday.  Patient states that she was exposed to Flu yesterday.  Patient reports fevers.

## 2023-09-29 NOTE — Discharge Instructions (Signed)
You have influenza A.  Tamiflu was prescribed.  Your symptoms will gradually improve over the next 7 to 10 days.  The cough may last about 3 weeks.   Take ibuprofen 600 mg with Tylenol 1000 mg for fever, headache or body aches.   For cough: You can also use guaifenesin and dextromethorphan for cough. You can use a humidifier for chest congestion and cough.  If you don't have a humidifier, you can sit in the bathroom with the hot shower running.      For sore throat: try warm salt water gargles, Mucinex sore throat cough drops or cepacol lozenges, throat spray, warm tea or water with lemon/honey, popsicles or ice, or OTC cold relief medicine for throat discomfort. You can also purchase chloraseptic spray at the pharmacy or dollar store.   For congestion: take a daily anti-histamine like Zyrtec, Claritin, and a oral decongestant, such as pseudoephedrine.  You can also use Flonase 1-2 sprays in each nostril daily. Afrin is also a good option, if you do not have high blood pressure.    It is important to stay hydrated: drink plenty of fluids (water, gatorade/powerade/pedialyte, juices, or teas) to keep your throat moisturized and help further relieve irritation/discomfort.    Return or go to the Emergency Department if symptoms worsen or do not improve in the next few days

## 2023-09-30 ENCOUNTER — Encounter: Payer: Self-pay | Admitting: Physician Assistant

## 2023-09-30 ENCOUNTER — Emergency Department: Payer: 59

## 2023-09-30 ENCOUNTER — Emergency Department
Admission: EM | Admit: 2023-09-30 | Discharge: 2023-09-30 | Discharge status: Against Medical Advice | Payer: 59 | Attending: Student | Admitting: Student

## 2023-09-30 ENCOUNTER — Ambulatory Visit (INDEPENDENT_AMBULATORY_CARE_PROVIDER_SITE_OTHER): Payer: 59 | Admitting: Physician Assistant

## 2023-09-30 ENCOUNTER — Other Ambulatory Visit: Payer: Self-pay

## 2023-09-30 ENCOUNTER — Encounter: Payer: Self-pay | Admitting: Emergency Medicine

## 2023-09-30 VITALS — BP 123/75 | HR 82 | Temp 98.0°F | Resp 16 | Ht 67.0 in | Wt 239.0 lb

## 2023-09-30 DIAGNOSIS — J101 Influenza due to other identified influenza virus with other respiratory manifestations: Secondary | ICD-10-CM | POA: Diagnosis not present

## 2023-09-30 DIAGNOSIS — R10A0 Flank pain, unspecified side: Secondary | ICD-10-CM

## 2023-09-30 DIAGNOSIS — N390 Urinary tract infection, site not specified: Secondary | ICD-10-CM | POA: Diagnosis not present

## 2023-09-30 DIAGNOSIS — R109 Unspecified abdominal pain: Secondary | ICD-10-CM

## 2023-09-30 DIAGNOSIS — Z5321 Procedure and treatment not carried out due to patient leaving prior to being seen by health care provider: Secondary | ICD-10-CM | POA: Diagnosis not present

## 2023-09-30 DIAGNOSIS — R1011 Right upper quadrant pain: Secondary | ICD-10-CM

## 2023-09-30 LAB — POCT URINALYSIS DIPSTICK
Bilirubin, UA: NEGATIVE
Blood, UA: NEGATIVE
Glucose, UA: NEGATIVE
Nitrite, UA: NEGATIVE
Protein, UA: NEGATIVE
Spec Grav, UA: 1.025 (ref 1.010–1.025)
Urobilinogen, UA: 0.2 U/dL
pH, UA: 5 (ref 5.0–8.0)

## 2023-09-30 LAB — URINALYSIS, ROUTINE W REFLEX MICROSCOPIC
Bilirubin Urine: NEGATIVE
Glucose, UA: NEGATIVE mg/dL
Hgb urine dipstick: NEGATIVE
Ketones, ur: 5 mg/dL — AB
Leukocytes,Ua: NEGATIVE
Nitrite: NEGATIVE
Protein, ur: 30 mg/dL — AB
Specific Gravity, Urine: 1.036 — ABNORMAL HIGH (ref 1.005–1.030)
pH: 5 (ref 5.0–8.0)

## 2023-09-30 LAB — COMPREHENSIVE METABOLIC PANEL
ALT: 29 U/L (ref 0–44)
AST: 29 U/L (ref 15–41)
Albumin: 4.5 g/dL (ref 3.5–5.0)
Alkaline Phosphatase: 74 U/L (ref 38–126)
Anion gap: 12 (ref 5–15)
BUN: 17 mg/dL (ref 6–20)
CO2: 24 mmol/L (ref 22–32)
Calcium: 9.4 mg/dL (ref 8.9–10.3)
Chloride: 101 mmol/L (ref 98–111)
Creatinine, Ser: 1.16 mg/dL — ABNORMAL HIGH (ref 0.44–1.00)
GFR, Estimated: 58 mL/min — ABNORMAL LOW (ref >60)
Glucose, Bld: 89 mg/dL (ref 70–99)
Potassium: 4.5 mmol/L (ref 3.5–5.1)
Sodium: 137 mmol/L (ref 135–145)
Total Bilirubin: 0.4 mg/dL (ref 0.0–1.2)
Total Protein: 7.5 g/dL (ref 6.5–8.1)

## 2023-09-30 LAB — CBC WITH DIFFERENTIAL/PLATELET
Abs Immature Granulocytes: 0.02 10*3/uL (ref 0.00–0.07)
Basophils Absolute: 0 10*3/uL (ref 0.0–0.1)
Basophils Relative: 1 %
Eosinophils Absolute: 0.1 10*3/uL (ref 0.0–0.5)
Eosinophils Relative: 2 %
HCT: 42 % (ref 36.0–46.0)
Hemoglobin: 14.1 g/dL (ref 12.0–15.0)
Immature Granulocytes: 1 %
Lymphocytes Relative: 32 %
Lymphs Abs: 1.4 10*3/uL (ref 0.7–4.0)
MCH: 30.1 pg (ref 26.0–34.0)
MCHC: 33.6 g/dL (ref 30.0–36.0)
MCV: 89.6 fL (ref 80.0–100.0)
Monocytes Absolute: 0.6 10*3/uL (ref 0.1–1.0)
Monocytes Relative: 14 %
Neutro Abs: 2.2 10*3/uL (ref 1.7–7.7)
Neutrophils Relative %: 50 %
Platelets: 249 10*3/uL (ref 150–400)
RBC: 4.69 MIL/uL (ref 3.87–5.11)
RDW: 13.3 % (ref 11.5–15.5)
WBC: 4.3 10*3/uL (ref 4.0–10.5)
nRBC: 0 % (ref 0.0–0.2)

## 2023-09-30 LAB — LIPASE, BLOOD: Lipase: 63 U/L — ABNORMAL HIGH (ref 11–51)

## 2023-09-30 NOTE — ED Triage Notes (Signed)
Pt in via POV, sent over from PCP w/ complaints of ongoing RUQ abdominal pain x 5 days, denies N/V/D.    NAD noted at this time.

## 2023-09-30 NOTE — ED Provider Triage Note (Signed)
Emergency Medicine Provider Triage Evaluation Note  Ashley Stephens , a 48 y.o. female  was evaluated in triage.  Pt complains of RUQ pain for 5 days. She was diagnosed with the flu yesterday.  Review of Systems  Positive: RUQ pain Negative: N/v/d  Physical Exam  There were no vitals taken for this visit. Gen:   Awake, no distress   Resp:  Normal effort  MSK:   Moves extremities without difficulty  Other:    Medical Decision Making  Medically screening exam initiated at 4:28 PM.  Appropriate orders placed.  Ashley Stephens was informed that the remainder of the evaluation will be completed by another provider, this initial triage assessment does not replace that evaluation, and the importance of remaining in the ED until their evaluation is complete.     Cameron Ali, PA-C 09/30/23 1629

## 2023-09-30 NOTE — Progress Notes (Signed)
[facility] [address] [place], [place] [address]  Internal MEDICINE  Office Visit Note  Patient Name: Ashley Stephens  [id number]  [id number]  Date of Service: 09/30/2023  Chief Complaint  Patient presents with   Acute Visit   Influenza    A positive yesterday in urgent care    Flank Pain    Right side flank pain and pain in upper back     HPI Pt is here for a sick visit. -RUQ pain, tenderness, worse since also getting sick on Friday. Dx with Flu yesterday. But pain started prior and is acutely worsening now. Told it could be aches due to flu at urgent care, but states this isnt like her body aches, this is pinpoint tenderness up under lower right ribs. Takes breath away with pressing. Pain goes through to upper back as well/shoulder -no vomiting, only diarrhea after tamiflu. No appetite changes from her baseline, but is on ozempic which reduced appetite already so unclear if this is occurring.  -fever/chills, though temp normal in office. Pt clammy in office and finds she is sweating -no headaches -taking tylenol and ibuprofen and it helps some with general body aches, but not the RUQ pain -concern for acute cholecystitis and discussed going to ED for further evaluation and treatment -symptoms complicated by having flu as well, but acute pain is worrisome for secondary condition  Current Medication:  Outpatient Encounter Medications as of 09/30/2023  Medication Sig   Adapalene-Benzoyl Peroxide 0.3-2.5 % GEL SMARTSIG:Topical Every Evening   ALPRAZolam (XANAX) 0.25 MG tablet Take 1 tablet (0.25 mg total) by mouth 2 (two) times daily as needed. for anxiety   cariprazine (VRAYLAR) 3 MG capsule Take 1 capsule (3 mg total) by mouth daily.   cholecalciferol (VITAMIN D3) 25 MCG (1000 UNIT) tablet Take 1,000 Units by mouth daily.   clindamycin (CLINDAGEL) 1 % gel SMARTSIG:Sparingly Topical Every Morning   Cyanocobalamin (VITAMIN B 12 PO) Take by mouth.    desvenlafaxine (PRISTIQ) 100 MG 24 hr tablet Take 1 tablet (100 mg total) by mouth every morning.   desvenlafaxine (PRISTIQ) 50 MG 24 hr tablet Take 1 tablet (50 mg total) by mouth at bedtime.   Dextromethorphan-buPROPion ER (AUVELITY) 45-105 MG TBCR Take 1 tablet by mouth 2 (two) times daily.   fluconazole (DIFLUCAN) 150 MG tablet Repeat in 3 days if symptoms have not improved.   hydrOXYzine (VISTARIL) 50 MG capsule Take 1 capsule by mouth three times daily as needed   lamoTRIgine (LAMICTAL) 100 MG tablet Take 1 tablet (100 mg total) by mouth 2 (two) times daily.   levothyroxine (SYNTHROID) 25 MCG tablet Take 1 tablet (25 mcg total) by mouth daily before breakfast.   mometasone (ELOCON) 0.1 % cream Apply topically 2 (two) times daily.   omeprazole (PRILOSEC) 40 MG capsule Take 1 capsule (40 mg total) by mouth daily.   oseltamivir (TAMIFLU) 75 MG capsule Take 1 capsule (75 mg total) by mouth every 12 (twelve) hours.   Oxcarbazepine (TRILEPTAL) 300 MG tablet Take 1 tablet (300 mg total) by mouth 2 (two) times daily.   propranolol (INDERAL) 20 MG tablet Take 1 tablet (20 mg total) by mouth 3 (three) times daily as needed.   rosuvastatin (CRESTOR) 5 MG tablet Take 1 tablet by mouth once daily   Semaglutide, 2 MG/DOSE, (OZEMPIC, 2 MG/DOSE,) 8 MG/3ML SOPN INJECT 2 MG SUBCUTANEOUSLY  ONCE A WEEK   spironolactone (ALDACTONE) 100 MG tablet Take 100 mg by mouth 2 (two) times daily.  triamcinolone cream (KENALOG) 0.1 % Apply 1 Application topically 2 (two) times daily. (Patient taking differently: Apply 1 Application topically as needed.)   No facility-administered encounter medications on file as of 09/30/2023.      Medical History: Past Medical History:  Diagnosis Date   Anxiety    Depression    Diabetes mellitus without complication (HCC)    Gastric ulcer    GERD (gastroesophageal reflux disease)    Hypertension    Osteoarthritis    Thyroid disorder      Vital Signs: BP 123/75   Pulse  82   Temp 98 F (36.7 C)   Resp 16   Ht 5\' 7"  (1.702 m)   Wt 239 lb (108.4 kg)   SpO2 97%   BMI 37.43 kg/m    Review of Systems  Constitutional:  Positive for chills, diaphoresis, fatigue and fever.  HENT:  Negative for congestion, mouth sores and postnasal drip.   Respiratory:  Positive for cough.   Cardiovascular:  Negative for chest pain.  Gastrointestinal:  Positive for abdominal pain and diarrhea. Negative for vomiting.  Genitourinary:  Positive for flank pain.  Musculoskeletal:  Positive for back pain.  Psychiatric/Behavioral: Negative.      Physical Exam Vitals and nursing note reviewed.  Constitutional:      Appearance: She is obese. She is ill-appearing and diaphoretic.  HENT:     Head: Normocephalic and atraumatic.  Cardiovascular:     Rate and Rhythm: Normal rate and regular rhythm.  Pulmonary:     Effort: Pulmonary effort is normal.     Breath sounds: Normal breath sounds.  Abdominal:     Tenderness: There is abdominal tenderness in the right upper quadrant. Positive signs include Murphy's sign.  Neurological:     Mental Status: She is alert.  Psychiatric:        Mood and Affect: Mood normal.        Behavior: Behavior normal.       Assessment/Plan: 1. RUQ pain (Primary) Concern for acute cholecystitis and will go to ED for further evaluation and treatment.  2. Influenza A virus present On tamiflu  3. Flank pain - POCT Urinalysis Dipstick  4. Urinary tract infection without hematuria, site unspecified Will send for culture, but only trace findings on urine dip - CULTURE, URINE COMPREHENSIVE   General Counseling: Robin verbalizes understanding of the findings of todays visit and agrees with plan of treatment. I have discussed any further diagnostic evaluation that may be needed or ordered today. We also reviewed her medications today. she has been encouraged to call the office with any questions or concerns that should arise related to todays  visit.    Counseling:    Orders Placed This Encounter  Procedures   CULTURE, URINE COMPREHENSIVE   POCT Urinalysis Dipstick    No orders of the defined types were placed in this encounter.   Time spent:30 Minutes

## 2023-10-01 ENCOUNTER — Telehealth: Payer: Self-pay

## 2023-10-01 NOTE — Telephone Encounter (Signed)
Gallstones are present, gallbladder is moderately distended but there are no signs of acute inflammation.

## 2023-10-02 ENCOUNTER — Ambulatory Visit: Payer: 59 | Admitting: Surgery

## 2023-10-02 ENCOUNTER — Other Ambulatory Visit: Payer: Self-pay | Admitting: Physician Assistant

## 2023-10-02 ENCOUNTER — Encounter: Payer: Self-pay | Admitting: Surgery

## 2023-10-02 VITALS — BP 110/75 | HR 78 | Temp 97.9°F | Ht 67.0 in | Wt 236.4 lb

## 2023-10-02 DIAGNOSIS — K8020 Calculus of gallbladder without cholecystitis without obstruction: Secondary | ICD-10-CM

## 2023-10-02 DIAGNOSIS — R1011 Right upper quadrant pain: Secondary | ICD-10-CM

## 2023-10-02 MED ORDER — TRAMADOL HCL 50 MG PO TABS: 50.0000 mg | ORAL_TABLET | Freq: Two times a day (BID) | ORAL | 0 refills | Status: DC | PRN

## 2023-10-02 NOTE — H&P (View-Only) (Signed)
10/02/2023  Reason for Visit:  Symptomatic cholelithiasis  Requesting Provider:  Lynn Ito, PA-C  History of Present Illness: Ashley Stephens is a 48 y.o. female presenting for evaluation of RUQ abdominal pain associated with nausea and vomiting.  The patient reports that she's had overall three main episodes of biliary colic.  Two were last year and this most recent one has lasted about 1 week with RUQ discomfort.  She started having pain about a week ago when she was also diagnosed with the flu.  Initially was thought that her RUQ pain was related to the flu, but it was not improving and she presented to the ED and her PCP's office.  Her pain radiates to her back.  Has had nausea and some episodes of emesis, but she is able to otherwise eat and keep food down.  Her laboratory workup did not reveal any significant findings except minimally elevated lipase of 63 but otherwise normal LFTs and WBC.  Her ultrasound showed cholelithiasis with a few gallstones, no wall thickening or fluid.    Past Medical History: Past Medical History:  Diagnosis Date   Anxiety    Depression    Diabetes mellitus without complication (HCC)    Gastric ulcer    GERD (gastroesophageal reflux disease)    Hypertension    Osteoarthritis    Thyroid disorder      Past Surgical History: Past Surgical History:  Procedure Laterality Date   ABDOMINAL HYSTERECTOMY  2013   BARIATRIC SURGERY     BREAST BIOPSY  1995   COLONOSCOPY WITH PROPOFOL N/A 10/28/2020   Procedure: COLONOSCOPY WITH PROPOFOL;  Surgeon: Wyline Mood, MD;  Location: [facility] ENDOSCOPY;  Service: Gastroenterology;  Laterality: N/A;   GASTRIC BYPASS  2012   TUBAL LIGATION  1996   tummy tuck  05/27/2020    Home Medications: Prior to Admission medications   Medication Sig Start Date End Date Taking? Authorizing Provider  Adapalene-Benzoyl Peroxide 0.3-2.5 % GEL SMARTSIG:Topical Every Evening   Yes [provider]  ALPRAZolam  (XANAX) 0.25 MG tablet Take 1 tablet (0.25 mg total) by mouth 2 (two) times daily as needed. for anxiety 09/03/23  Yes Corie Chiquito, PMHNP  cholecalciferol (VITAMIN D3) 25 MCG (1000 UNIT) tablet Take 1,000 Units by mouth daily.   Yes [provider]  clindamycin (CLINDAGEL) 1 % gel SMARTSIG:Sparingly Topical Every Morning   Yes [provider]  Cyanocobalamin (VITAMIN B 12 PO) Take by mouth.   Yes [provider]  desvenlafaxine (PRISTIQ) 100 MG 24 hr tablet Take 1 tablet (100 mg total) by mouth every morning. 07/24/23  Yes Corie Chiquito, PMHNP  desvenlafaxine (PRISTIQ) 50 MG 24 hr tablet Take 1 tablet (50 mg total) by mouth at bedtime. 07/24/23 01/20/24 Yes Corie Chiquito, PMHNP  fluconazole (DIFLUCAN) 150 MG tablet Repeat in 3 days if symptoms have not improved. 07/30/23  Yes McDonough, Salomon Fick, PA-C  hydrOXYzine (VISTARIL) 50 MG capsule Take 1 capsule by mouth three times daily as needed 07/24/23  Yes Corie Chiquito, PMHNP  levothyroxine (SYNTHROID) 25 MCG tablet Take 1 tablet (25 mcg total) by mouth daily before breakfast. 11/12/22  Yes McDonough, Lauren K, PA-C  mometasone (ELOCON) 0.1 % cream Apply topically 2 (two) times daily. 09/25/22  Yes [provider]  omeprazole (PRILOSEC) 40 MG capsule Take 1 capsule (40 mg total) by mouth daily. 08/11/23  Yes Lyndon Code, MD  oseltamivir (TAMIFLU) 75 MG capsule Take 1 capsule (75 mg total) by mouth every 12 (twelve)  hours. 09/29/23  Yes Brimage, Vondra, DO  Oxcarbazepine (TRILEPTAL) 300 MG tablet Take 1 tablet (300 mg total) by mouth 2 (two) times daily. 07/24/23 10/22/23 Yes Corie Chiquito, PMHNP  propranolol (INDERAL) 20 MG tablet Take 1 tablet (20 mg total) by mouth 3 (three) times daily as needed. 07/24/23  Yes Corie Chiquito, PMHNP  rosuvastatin (CRESTOR) 5 MG tablet Take 1 tablet by mouth once daily 08/11/23  Yes Lyndon Code, MD  Semaglutide, 2 MG/DOSE, (OZEMPIC, 2 MG/DOSE,) 8 MG/3ML SOPN INJECT 2 MG  SUBCUTANEOUSLY  ONCE A WEEK 09/03/23  Yes McDonough, Lauren K, PA-C  spironolactone (ALDACTONE) 100 MG tablet Take 100 mg by mouth 2 (two) times daily. 10/06/21  Yes [provider]  traMADol (ULTRAM) 50 MG tablet Take 1 tablet (50 mg total) by mouth every 12 (twelve) hours as needed for up to 7 days. 10/02/23 10/09/23 Yes McDonough, Salomon Fick, PA-C    Allergies: No Known Allergies  Social History:  reports that she has never smoked. She has never used smokeless tobacco. She reports that she does not drink alcohol and does not use drugs.   Family History: Family History  Problem Relation Age of Onset   Breast cancer Maternal Grandmother    Diabetes Maternal Grandmother    Heart disease Maternal Grandmother    Hypertension Maternal Grandmother    Heart disease Brother    Hypertension Brother    Hyperlipidemia Brother    Diabetes Maternal Aunt    Heart disease Maternal Aunt    Hypertension Maternal Aunt    Hyperlipidemia Maternal Aunt    Diabetes Maternal Aunt    Heart disease Maternal Aunt    Hypertension Maternal Aunt    Hyperlipidemia Maternal Aunt    Diabetes Maternal Aunt    Heart disease Maternal Aunt    Hypertension Maternal Aunt    Hyperlipidemia Maternal Aunt     Review of Systems: Review of Systems  Constitutional:  Negative for chills and fever.  HENT:  Negative for hearing loss.   Respiratory:  Negative for shortness of breath.   Cardiovascular:  Negative for chest pain.  Gastrointestinal:  Positive for abdominal pain, nausea and vomiting. Negative for constipation.  Genitourinary:  Negative for dysuria.  Musculoskeletal:  Positive for back pain. Negative for myalgias.  Skin:  Negative for rash.  Neurological:  Negative for dizziness.  Psychiatric/Behavioral:  Negative for depression.     Physical Exam BP 110/75   Pulse 78   Temp 97.9 F (36.6 C) (Oral)   Ht 5\' 7"  (1.702 m)   Wt 236 lb 6.4 oz (107.2 kg)   SpO2 98%   BMI 37.03 kg/m   CONSTITUTIONAL: No acute distress HEENT:  Normocephalic, atraumatic, extraocular motion intact. NECK: Trachea is midline, and there is no jugular venous distension.  RESPIRATORY:  Lungs are clear, and breath sounds are equal bilaterally. Normal respiratory effort without pathologic use of accessory muscles. CARDIOVASCULAR: Heart is regular without murmurs, gallops, or rubs. GI: The abdomen is soft, non-distended, with tenderness in the RUQ, negative Murphy's sign.  Prior incisions well healed, no evidence of hernia.  MUSCULOSKELETAL:  Normal muscle strength and tone in all four extremities.  No peripheral edema or cyanosis. SKIN: Skin turgor is normal. There are no pathologic skin lesions.  NEUROLOGIC:  Motor and sensation is grossly normal.  Cranial nerves are grossly intact. PSYCH:  Alert and oriented to person, place and time. Affect is normal.  Laboratory Analysis: Labs on 09/30/2023: Sodium 137, potassium 4.5, chloride 101,  CO2 24, BUN 17, creatinine 1.16.  Total bilirubin 0.4, AST 29, ALT 29, alkaline phosphatase 74, albumin 4.5, lipase 63.  WBC 4.3, hemoglobin 14.1, hematocrit 42, platelets 249.  Imaging: Ultrasound RUQ on 09/30/2023: IMPRESSION: 1. Gallbladder foci may represent stones or sludge balls. No sonographic findings of acute cholecystitis. 2. No biliary dilatation  Assessment and Plan: This is a 48 y.o. female with symptomatic cholelithiasis.  -Discussed with the patient the findings on her ultrasound and labs.  Overall her workup points to her pain being biliary etiology.  Discussed with her how the gallbladder works and how the gallstones can cause episodes of biliary colic.  Discussed with her that the main management for this is in the form of surgery with cholecystectomy.  She would like to plan for surgery. --Discussed with her then the plan for robotic assisted cholecystectomy.  Reviewed the surgery at length with her including the planned incisions, the risks of  bleeding, infection, injury to surrounding structures, that this would be an outpatient procedure, the use of ICG to better evaluate the biliary anatomy, post-operative activity restrictions, time off from work, pain control, and she's willing to proceed. --Will schedule her for surgery on 10/08/23.  Will also give her information on gallbladder diet plan.  All of her questions have been answered.  I spent 55 minutes dedicated to the care of this patient on the date of this encounter to include pre-visit review of records, face-to-face time with the patient discussing diagnosis and management, and any post-visit coordination of care.   Howie Ill, MD [facility]

## 2023-10-02 NOTE — Progress Notes (Unsigned)
10/02/2023  Reason for Visit:  Symptomatic cholelithiasis  Requesting Provider:  Lynn Ito, PA-C  History of Present Illness: Ashley Stephens is a 48 y.o. female presenting for evaluation of RUQ abdominal pain associated with nausea and vomiting.  The patient reports that she's had overall three main episodes of biliary colic.  Two were last year and this most recent one has lasted about 1 week with RUQ discomfort.  She started having pain about a week ago when she was also diagnosed with the flu.  Initially was thought that her RUQ pain was related to the flu, but it was not improving and she presented to the ED and her PCP's office.  Her pain radiates to her back.  Has had nausea and some episodes of emesis, but she is able to otherwise eat and keep food down.  Her laboratory workup did not reveal any significant findings except minimally elevated lipase of 63 but otherwise normal LFTs and WBC.  Her ultrasound showed cholelithiasis with a few gallstones, no wall thickening or fluid.    Past Medical History: Past Medical History:  Diagnosis Date   Anxiety    Depression    Diabetes mellitus without complication (HCC)    Gastric ulcer    GERD (gastroesophageal reflux disease)    Hypertension    Osteoarthritis    Thyroid disorder      Past Surgical History: Past Surgical History:  Procedure Laterality Date   ABDOMINAL HYSTERECTOMY  2013   BARIATRIC SURGERY     BREAST BIOPSY  1995   COLONOSCOPY WITH PROPOFOL N/A 10/28/2020   Procedure: COLONOSCOPY WITH PROPOFOL;  Surgeon: Wyline Mood, MD;  Location: [facility] ENDOSCOPY;  Service: Gastroenterology;  Laterality: N/A;   GASTRIC BYPASS  2012   TUBAL LIGATION  1996   tummy tuck  05/27/2020    Home Medications: Prior to Admission medications   Medication Sig Start Date End Date Taking? Authorizing Provider  Adapalene-Benzoyl Peroxide 0.3-2.5 % GEL SMARTSIG:Topical Every Evening   Yes [provider]  ALPRAZolam  (XANAX) 0.25 MG tablet Take 1 tablet (0.25 mg total) by mouth 2 (two) times daily as needed. for anxiety 09/03/23  Yes Corie Chiquito, PMHNP  cholecalciferol (VITAMIN D3) 25 MCG (1000 UNIT) tablet Take 1,000 Units by mouth daily.   Yes [provider]  clindamycin (CLINDAGEL) 1 % gel SMARTSIG:Sparingly Topical Every Morning   Yes [provider]  Cyanocobalamin (VITAMIN B 12 PO) Take by mouth.   Yes [provider]  desvenlafaxine (PRISTIQ) 100 MG 24 hr tablet Take 1 tablet (100 mg total) by mouth every morning. 07/24/23  Yes Corie Chiquito, PMHNP  desvenlafaxine (PRISTIQ) 50 MG 24 hr tablet Take 1 tablet (50 mg total) by mouth at bedtime. 07/24/23 01/20/24 Yes Corie Chiquito, PMHNP  fluconazole (DIFLUCAN) 150 MG tablet Repeat in 3 days if symptoms have not improved. 07/30/23  Yes McDonough, Salomon Fick, PA-C  hydrOXYzine (VISTARIL) 50 MG capsule Take 1 capsule by mouth three times daily as needed 07/24/23  Yes Corie Chiquito, PMHNP  levothyroxine (SYNTHROID) 25 MCG tablet Take 1 tablet (25 mcg total) by mouth daily before breakfast. 11/12/22  Yes McDonough, Lauren K, PA-C  mometasone (ELOCON) 0.1 % cream Apply topically 2 (two) times daily. 09/25/22  Yes [provider]  omeprazole (PRILOSEC) 40 MG capsule Take 1 capsule (40 mg total) by mouth daily. 08/11/23  Yes Lyndon Code, MD  oseltamivir (TAMIFLU) 75 MG capsule Take 1 capsule (75 mg total) by mouth every 12 (twelve)  hours. 09/29/23  Yes Brimage, Vondra, DO  Oxcarbazepine (TRILEPTAL) 300 MG tablet Take 1 tablet (300 mg total) by mouth 2 (two) times daily. 07/24/23 10/22/23 Yes Corie Chiquito, PMHNP  propranolol (INDERAL) 20 MG tablet Take 1 tablet (20 mg total) by mouth 3 (three) times daily as needed. 07/24/23  Yes Corie Chiquito, PMHNP  rosuvastatin (CRESTOR) 5 MG tablet Take 1 tablet by mouth once daily 08/11/23  Yes Lyndon Code, MD  Semaglutide, 2 MG/DOSE, (OZEMPIC, 2 MG/DOSE,) 8 MG/3ML SOPN INJECT 2 MG  SUBCUTANEOUSLY  ONCE A WEEK 09/03/23  Yes McDonough, Lauren K, PA-C  spironolactone (ALDACTONE) 100 MG tablet Take 100 mg by mouth 2 (two) times daily. 10/06/21  Yes [provider]  traMADol (ULTRAM) 50 MG tablet Take 1 tablet (50 mg total) by mouth every 12 (twelve) hours as needed for up to 7 days. 10/02/23 10/09/23 Yes McDonough, Salomon Fick, PA-C    Allergies: No Known Allergies  Social History:  reports that she has never smoked. She has never used smokeless tobacco. She reports that she does not drink alcohol and does not use drugs.   Family History: Family History  Problem Relation Age of Onset   Breast cancer Maternal Grandmother    Diabetes Maternal Grandmother    Heart disease Maternal Grandmother    Hypertension Maternal Grandmother    Heart disease Brother    Hypertension Brother    Hyperlipidemia Brother    Diabetes Maternal Aunt    Heart disease Maternal Aunt    Hypertension Maternal Aunt    Hyperlipidemia Maternal Aunt    Diabetes Maternal Aunt    Heart disease Maternal Aunt    Hypertension Maternal Aunt    Hyperlipidemia Maternal Aunt    Diabetes Maternal Aunt    Heart disease Maternal Aunt    Hypertension Maternal Aunt    Hyperlipidemia Maternal Aunt     Review of Systems: Review of Systems  Constitutional:  Negative for chills and fever.  HENT:  Negative for hearing loss.   Respiratory:  Negative for shortness of breath.   Cardiovascular:  Negative for chest pain.  Gastrointestinal:  Positive for abdominal pain, nausea and vomiting. Negative for constipation.  Genitourinary:  Negative for dysuria.  Musculoskeletal:  Positive for back pain. Negative for myalgias.  Skin:  Negative for rash.  Neurological:  Negative for dizziness.  Psychiatric/Behavioral:  Negative for depression.     Physical Exam BP 110/75   Pulse 78   Temp 97.9 F (36.6 C) (Oral)   Ht 5\' 7"  (1.702 m)   Wt 236 lb 6.4 oz (107.2 kg)   SpO2 98%   BMI 37.03 kg/m   CONSTITUTIONAL: No acute distress HEENT:  Normocephalic, atraumatic, extraocular motion intact. NECK: Trachea is midline, and there is no jugular venous distension.  RESPIRATORY:  Lungs are clear, and breath sounds are equal bilaterally. Normal respiratory effort without pathologic use of accessory muscles. CARDIOVASCULAR: Heart is regular without murmurs, gallops, or rubs. GI: The abdomen is soft, non-distended, with tenderness in the RUQ, negative Murphy's sign.  Prior incisions well healed, no evidence of hernia.  MUSCULOSKELETAL:  Normal muscle strength and tone in all four extremities.  No peripheral edema or cyanosis. SKIN: Skin turgor is normal. There are no pathologic skin lesions.  NEUROLOGIC:  Motor and sensation is grossly normal.  Cranial nerves are grossly intact. PSYCH:  Alert and oriented to person, place and time. Affect is normal.  Laboratory Analysis: Labs on 09/30/2023: Sodium 137, potassium 4.5, chloride 101,  CO2 24, BUN 17, creatinine 1.16.  Total bilirubin 0.4, AST 29, ALT 29, alkaline phosphatase 74, albumin 4.5, lipase 63.  WBC 4.3, hemoglobin 14.1, hematocrit 42, platelets 249.  Imaging: Ultrasound RUQ on 09/30/2023: IMPRESSION: 1. Gallbladder foci may represent stones or sludge balls. No sonographic findings of acute cholecystitis. 2. No biliary dilatation  Assessment and Plan: This is a 48 y.o. female with symptomatic cholelithiasis.  -Discussed with the patient the findings on her ultrasound and labs.  Overall her workup points to her pain being biliary etiology.  Discussed with her how the gallbladder works and how the gallstones can cause episodes of biliary colic.  Discussed with her that the main management for this is in the form of surgery with cholecystectomy.  She would like to plan for surgery. --Discussed with her then the plan for robotic assisted cholecystectomy.  Reviewed the surgery at length with her including the planned incisions, the risks of  bleeding, infection, injury to surrounding structures, that this would be an outpatient procedure, the use of ICG to better evaluate the biliary anatomy, post-operative activity restrictions, time off from work, pain control, and she's willing to proceed. --Will schedule her for surgery on 10/08/23.  Will also give her information on gallbladder diet plan.  All of her questions have been answered.  I spent 55 minutes dedicated to the care of this patient on the date of this encounter to include pre-visit review of records, face-to-face time with the patient discussing diagnosis and management, and any post-visit coordination of care.   Howie Ill, MD [facility]

## 2023-10-02 NOTE — Patient Instructions (Addendum)
You have requested to have your gallbladder removed. This will be done at [facility] with Dr. Aleen Campi.  You will most likely be out of work 1-2 weeks for this surgery.  If you have FMLA or disability paperwork that needs filled out you may drop this off at our office or this can be faxed to [phone].  You will return after your post-op appointment with a lifting restriction for approximately 4 more weeks.  You will be able to eat anything you would like to following surgery. But, start by eating a bland diet and advance this as tolerated. The Gallbladder diet is below, please go as closely by this diet as possible prior to surgery to avoid any further attacks.  Please see the (blue)pre-care form that you have been given today. Our surgery scheduler will call you to verify surgery date and to go over information.   If you have any questions, please call our office.  Laparoscopic Cholecystectomy Laparoscopic cholecystectomy is surgery to remove the gallbladder. The gallbladder is located in the upper right part of the abdomen, behind the liver. It is a storage sac for bile, which is produced in the liver. Bile aids in the digestion and absorption of fats. Cholecystectomy is often done for inflammation of the gallbladder (cholecystitis). This condition is usually caused by a buildup of gallstones (cholelithiasis) in the gallbladder. Gallstones can block the flow of bile, and that can result in inflammation and pain. In severe cases, emergency surgery may be required. If emergency surgery is not required, you will have time to prepare for the procedure. Laparoscopic surgery is an alternative to open surgery. Laparoscopic surgery has a shorter recovery time. Your common bile duct may also need to be examined during the procedure. If stones are found in the common bile duct, they may be removed. LET [facility] CARE PROVIDER KNOW ABOUT: Any allergies you have. All medicines you are taking,  including vitamins, herbs, eye drops, creams, and over-the-counter medicines. Previous problems you or members of your family have had with the use of anesthetics. Any blood disorders you have. Previous surgeries you have had.  Any medical conditions you have. RISKS AND COMPLICATIONS Generally, this is a safe procedure. However, problems may occur, including: Infection. Bleeding. Allergic reactions to medicines. Damage to other structures or organs. A stone remaining in the common bile duct. A bile leak from the cyst duct that is clipped when your gallbladder is removed. The need to convert to open surgery, which requires a larger incision in the abdomen. This may be necessary if your surgeon thinks that it is not safe to continue with a laparoscopic procedure. BEFORE THE PROCEDURE Ask your health care provider about: Changing or stopping your regular medicines. This is especially important if you are taking diabetes medicines or blood thinners. Taking medicines such as aspirin and ibuprofen. These medicines can thin your blood. Do not take these medicines before your procedure if your health care provider instructs you not to. Follow instructions from your health care provider about eating or drinking restrictions. Let your health care provider know if you develop a cold or an infection before surgery. Plan to have someone take you home after the procedure. Ask your health care provider how your surgical site will be marked or identified. You may be given antibiotic medicine to help prevent infection. PROCEDURE To reduce your risk of infection: Your health care team will wash or sanitize their hands. Your skin will be washed with soap. An IV  tube may be inserted into one of your veins. You will be given a medicine to make you fall asleep (general anesthetic). A breathing tube will be placed in your mouth. The surgeon will make several small cuts (incisions) in your abdomen. A thin,  lighted tube (laparoscope) that has a tiny camera on the end will be inserted through one of the small incisions. The camera on the laparoscope will send a picture to a TV screen (monitor) in the operating room. This will give the surgeon a good view inside your abdomen. A gas will be pumped into your abdomen. This will expand your abdomen to give the surgeon more room to perform the surgery. Other tools that are needed for the procedure will be inserted through the other incisions. The gallbladder will be removed through one of the incisions. After your gallbladder has been removed, the incisions will be closed with stitches (sutures), staples, or skin glue. Your incisions may be covered with a bandage (dressing). The procedure may vary among health care providers and hospitals. AFTER THE PROCEDURE Your blood pressure, heart rate, breathing rate, and blood oxygen level will be monitored often until the medicines you were given have worn off. You will be given medicines as needed to control your pain.   This information is not intended to replace advice given to you by your health care provider. Make sure you discuss any questions you have with your health care provider.   Document Released: 08/20/2005 Document Revised: 05/11/2015 Document Reviewed: 04/01/2013 Elsevier Interactive Patient Education 2016 Elsevier Inc.   Low-Fat Diet for Gallbladder Conditions A low-fat diet can be helpful if you have pancreatitis or a gallbladder condition. With these conditions, your pancreas and gallbladder have trouble digesting fats. A healthy eating plan with less fat will help rest your pancreas and gallbladder and reduce your symptoms. WHAT DO I NEED TO KNOW ABOUT THIS DIET? Eat a low-fat diet. Reduce your fat intake to less than 20-30% of your total daily calories. This is less than 50-60 g of fat per day. Remember that you need some fat in your diet. Ask your dietician what your daily goal should  be. Choose nonfat and low-fat healthy foods. Look for the words "nonfat," "low fat," or "fat free." As a guide, look on the label and choose foods with less than 3 g of fat per serving. Eat only one serving. Avoid alcohol. Do not smoke. If you need help quitting, talk with your health care provider. Eat small frequent meals instead of three large heavy meals. WHAT FOODS CAN I EAT? Grains Include healthy grains and starches such as potatoes, wheat bread, fiber-rich cereal, and brown rice. Choose whole grain options whenever possible. In adults, whole grains should account for 45-65% of your daily calories.  Fruits and Vegetables Eat plenty of fruits and vegetables. Fresh fruits and vegetables add fiber to your diet. Meats and Other Protein Sources Eat lean meat such as chicken and pork. Trim any fat off of meat before cooking it. Eggs, fish, and beans are other sources of protein. In adults, these foods should account for 10-35% of your daily calories. Dairy Choose low-fat milk and dairy options. Dairy includes fat and protein, as well as calcium.  Fats and Oils Limit high-fat foods such as fried foods, sweets, baked goods, sugary drinks.  Other Creamy sauces and condiments, such as mayonnaise, can add extra fat. Think about whether or not you need to use them, or use smaller amounts or low fat options.  WHAT FOODS ARE NOT RECOMMENDED? High fat foods, such as: [employer]. Ice cream. [place] toast. Sweet rolls. Pizza. Cheese bread. Foods covered with batter, butter, creamy sauces, or cheese. Fried foods. Sugary drinks and desserts. Foods that cause gas or bloating   This information is not intended to replace advice given to you by your health care provider. Make sure you discuss any questions you have with your health care provider.   Document Released: 08/25/2013 Document Reviewed: 08/25/2013 Elsevier Interactive Patient Education 2016 Elsevier Inc.  Gallbladder Eating Plan High  blood cholesterol, obesity, a sedentary lifestyle, an unhealthy diet, and diabetes are risk factors for developing gallstones. If you have a gallbladder condition, you may have trouble digesting fats and tolerating high fat intake. Eating a low-fat diet can help reduce your symptoms and may be helpful before and after having surgery to remove your gallbladder (cholecystectomy). Your health care provider may recommend that you work with a dietitian to help you reduce the amount of fat in your diet. What are tips for following this plan? General guidelines Limit your fat intake to less than 30% of your total daily calories. If you eat around 1,800 calories each day, this means eating less than 60 grams (g) of fat per day. Fat is an important part of a healthy diet. Eating a low-fat diet can make it hard to maintain a healthy body weight. Ask your dietitian how much fat, calories, and other nutrients you need each day. Eat small, frequent meals throughout the day instead of three large meals. Drink at least 8-10 cups (1.9-2.4 L) of fluid a day. Drink enough fluid to keep your urine pale yellow. If you drink alcohol: Limit how much you have to: 0-1 drink a day for women who are not pregnant. 0-2 drinks a day for men. Know how much alcohol is in a drink. In the [place] one drink equals one 12 oz bottle of beer (355 mL), one 5 oz glass of wine (148 mL), or one 1 oz glass of hard liquor (44 mL). Reading food labels  Check nutrition facts on food labels for the amount of fat per serving. Choose foods with less than 3 grams of fat per serving. Shopping Choose nonfat and low-fat healthy foods. Look for the words "nonfat," "low-fat," or "fat-free." Avoid buying processed or prepackaged foods. Cooking Cook using low-fat methods, such as baking, broiling, grilling, or boiling. Cook with small amounts of healthy fats, such as olive oil, grapeseed oil, canola oil, avocado oil, or sunflower oil. What foods are  recommended?  All fresh, frozen, or canned fruits and vegetables. Whole grains. Low-fat or nonfat (skim) milk and yogurt. Lean meat, skinless poultry, fish, eggs, and beans. Low-fat protein supplement powders or drinks. Spices and herbs. The items listed above may not be a complete list of foods and beverages you can eat and drink. Contact a dietitian for more information. What foods are not recommended? High-fat foods. These include baked goods, fast food, fatty cuts of meat, ice cream, french toast, sweet rolls, pizza, cheese bread, foods covered with butter, creamy sauces, or cheese. Fried foods. These include french fries, tempura, battered fish, breaded chicken, fried breads, and sweets. Foods that cause bloating and gas. The items listed above may not be a complete list of foods that you should avoid. Contact a dietitian for more information. Summary A low-fat diet can be helpful if you have a gallbladder condition, or before and after gallbladder surgery. Limit your fat intake to less than  30% of your total daily calories. This is about 60 g of fat if you eat 1,800 calories each day. Eat small, frequent meals throughout the day instead of three large meals. This information is not intended to replace advice given to you by your health care provider. Make sure you discuss any questions you have with your health care provider. Document Revised: 08/04/2021 Document Reviewed: 08/04/2021 Elsevier Patient Education  2024 [employer].

## 2023-10-02 NOTE — Telephone Encounter (Signed)
Pt advised as per lauren referral and pain meds sent. She  should be cautioned that pain meds can make her drowsy and can interact some with her psych medications and should take only as needed.

## 2023-10-03 ENCOUNTER — Telehealth: Payer: Self-pay | Admitting: Physician Assistant

## 2023-10-03 ENCOUNTER — Telehealth: Payer: Self-pay | Admitting: Surgery

## 2023-10-03 LAB — CULTURE, URINE COMPREHENSIVE

## 2023-10-03 NOTE — Telephone Encounter (Addendum)
Per [facility], they have not been able to reach patient to schedule SS. Lvm & sent msg to patient to return my call-Toni  Patient returned call. I gave her [facility] telephone # to schedule-Toni

## 2023-10-03 NOTE — Telephone Encounter (Signed)
Patient has been advised of Pre-Admission date/time, and Surgery date at [facility].  Surgery Date: 10/08/23 Preadmission Testing Date: 10/04/23 (phone 8a-1p)  Patient has been made aware to call [phone], between 1-3:00pm the day before surgery, to find out what time to arrive for surgery.

## 2023-10-04 ENCOUNTER — Encounter
Admission: RE | Admit: 2023-10-04 | Discharge: 2023-10-04 | Disposition: A | Discharge status: Home / Self Care | Payer: 59 | Source: Ambulatory Visit | Attending: Surgery | Admitting: Surgery

## 2023-10-04 DIAGNOSIS — Z01810 Encounter for preprocedural cardiovascular examination: Secondary | ICD-10-CM

## 2023-10-04 DIAGNOSIS — E1165 Type 2 diabetes mellitus with hyperglycemia: Secondary | ICD-10-CM

## 2023-10-04 HISTORY — DX: Calculus of gallbladder without cholecystitis without obstruction: K80.20

## 2023-10-04 HISTORY — DX: Deficiency of other specified B group vitamins: E53.8

## 2023-10-04 HISTORY — DX: Obesity, unspecified: E66.9

## 2023-10-04 HISTORY — DX: Sleep apnea, unspecified: G47.30

## 2023-10-04 HISTORY — DX: Other intervertebral disc degeneration, lumbar region without mention of lumbar back pain or lower extremity pain: M51.369

## 2023-10-04 HISTORY — DX: Bariatric surgery status: Z98.84

## 2023-10-04 HISTORY — DX: Type 2 diabetes mellitus without complications: E11.9

## 2023-10-04 HISTORY — DX: Hypothyroidism, unspecified: E03.9

## 2023-10-04 NOTE — Progress Notes (Signed)
Pt unable to come in for EKG due to living in [employer] and just getting over the flu and having to be out of work since being sick with flu. Pt states she is starting a new job on Monday and will be in training and will not get off work until 1630 Monday evening. I informed pt we can do EKG day of surgery but if EKG is abnormal then anesthesia may cancel surgery. Pt verbalized understanding

## 2023-10-04 NOTE — Patient Instructions (Addendum)
Your procedure is scheduled on:10-08-23 Tuesday Report to the Registration Desk on the 1st floor of the [facility].Then proceed to the 2nd floor Surgery Desk To find out your arrival time, please call [phone] between 1PM - 3PM on:10-07-23 Monday If your arrival time is 6:00 am, do not arrive before that time as the [facility] entrance doors do not open until 6:00 am.  REMEMBER: Instructions that are not followed completely may result in serious medical risk, up to and including death; or upon the discretion of your surgeon and anesthesiologist your surgery may need to be rescheduled.  Do not eat food after midnight the night before surgery.  No gum chewing or hard candies.  You may however, drink Water up to 2 hours before you are scheduled to arrive for your surgery. Do not drink anything within 2 hours of your scheduled arrival time.  One week prior to surgery:Stop NOW (10-04-23) Stop Anti-inflammatories (NSAIDS) such as Advil, Aleve, Ibuprofen, Motrin, Naproxen, Naprosyn and Aspirin based products such as Excedrin, Goody's Powder, BC Powder. Stop ANY OVER THE COUNTER supplements until after surgery (Vitamin C, D3, B12, Multivitamin, Zinc)  You may however, continue to take Tylenol if needed for pain up until the day of surgery.  Stop Semaglutide (Ozempic) 7 days prior to surgery-Do NOT take again until AFTER surgery  Continue taking all of your other prescription medications up until the day of surgery.  ON THE DAY OF SURGERY ONLY TAKE THESE MEDICATIONS WITH SIPS OF WATER: -desvenlafaxine (PRISTIQ)  -levothyroxine (SYNTHROID)  -Oxcarbazepine (TRILEPTAL)  -propranolol (INDERAL)  -you may take ALPRAZolam Prudy Feeler) if needed for anxiety  No Alcohol for 24 hours before or after surgery.  No Smoking including e-cigarettes for 24 hours before surgery.  No chewable tobacco products for at least 6 hours before surgery.  No nicotine patches on the day of surgery.  Do not use any  "recreational" drugs for at least a week (preferably 2 weeks) before your surgery.  Please be advised that the combination of cocaine and anesthesia may have negative outcomes, up to and including death. If you test positive for cocaine, your surgery will be cancelled.  On the morning of surgery brush your teeth with toothpaste and water, you may rinse your mouth with mouthwash if you wish. Do not swallow any toothpaste or mouthwash.  Do not wear jewelry, make-up, hairpins, clips or nail polish.  For welded (permanent) jewelry: bracelets, anklets, waist bands, etc.  Please have this removed prior to surgery.  If it is not removed, there is a chance that hospital personnel will need to cut it off on the day of surgery.  Do not wear lotions, powders, or perfumes.   Do not shave body hair from the neck down 48 hours before surgery.  Contact lenses, hearing aids and dentures may not be worn into surgery.  Do not bring valuables to the hospital. [facility] is not responsible for any missing/lost belongings or valuables.    Notify your doctor if there is any change in your medical condition (cold, fever, infection).  Wear comfortable clothing (specific to your surgery type) to the hospital.  After surgery, you can help prevent lung complications by doing breathing exercises.  Take deep breaths and cough every 1-2 hours. Your doctor may order a device called an Incentive Spirometer to help you take deep breaths. When coughing or sneezing, hold a pillow firmly against your incision with both hands. This is called "splinting." Doing this helps protect your incision.  It also decreases belly discomfort.  If you are being admitted to the hospital overnight, leave your suitcase in the car. After surgery it may be brought to your room.  In case of increased patient census, it may be necessary for you, the patient, to continue your postoperative care in the Same Day Surgery department.  If you are  being discharged the day of surgery, you will not be allowed to drive home. You will need a responsible individual to drive you home and stay with you for 24 hours after surgery.   If you are taking public transportation, you will need to have a responsible individual with you.  Please call the Pre-admissions Testing Dept. at [phone] if you have any questions about these instructions.  Surgery Visitation Policy:  Patients having surgery or a procedure may have two visitors.  Children under the age of 22 must have an adult with them who is not the patient.  Temporary Visitor Restrictions Due to increasing cases of flu, RSV and COVID-19: Children ages 64 and under will not be able to visit patients in [facility] hospitals under most circumstances.

## 2023-10-07 ENCOUNTER — Telehealth: Payer: Self-pay | Admitting: Physician Assistant

## 2023-10-07 NOTE — Progress Notes (Signed)
  Perioperative Services Pre-Admission/Anesthesia Testing    Date: 10/07/23  Name: Ashley Stephens MRN:   [id number]  Re: GLP-1 clearance and provider recommendations   Planned Surgical Procedure(s):    Case: [id number] Date/Time: 10/08/23 0945   Procedures:      XI ROBOTIC ASSISTED LAPAROSCOPIC CHOLECYSTECTOMY     INDOCYANINE GREEN FLUORESCENCE IMAGING (ICG)   Anesthesia type: General   Pre-op diagnosis: symptomatic cholelithiasis   Location: [facility] OR ROOM 04 / [facility]S FOR ANEST[facility]   Surgeons: Henrene Dodge, MD      Clinical Notes:  Patient is scheduled for the above procedure with the indicated provider/surgeon. In review of her medication reconciliation it was noted that patient is on a prescribed GLP-1 medication. Per guidelines issued by the American Society of Anesthesiologists (ASA), it is recommended that these medications be held for 7 days prior to the patient undergoing any type of elective surgical procedure. The patient is taking the following GLP-1 medication:  [x]  SEMAGLUTIDE   []  EXENATIDE  []  LIRAGLUTIDE   []  LIXISENATIDE  []  DULAGLUTIDE     []  TIRZEPATIDE (GLP-1/GIP)  Reached out to prescribing provider (McDonough, PA-C) to make them aware of the guidelines from anesthesia. Given that this patient takes the prescribed GLP-1 medication for her  diabetes diagnosis, rather than for weight loss, recommendations from the prescribing provider were solicited. Prescribing provider made aware of the following so that informed decision/POC can be developed for this patient that may be taking medications belonging to these drug classes:  Oral GLP-1 medications will be held 1 day prior to surgery.  Injectable GLP-1 medications will be held 7 days prior to surgery.  Metformin is routinely held 48 hours prior to surgery due to renal concerns, potential need for contrasted imaging perioperatively, and the potential for tissue hypoxia leading to drug induced  lactic acidosis.  All SGLT2i medications are held 72 hours prior to surgery as they can be associated with the increased potential for developing euglycemic diabetic ketoacidosis (EDKA).   Impression and Plan:  Ashley Stephens is on a prescribed GLP-1 medication, which induces the known side effect of decreased gastric emptying. Efforts are bring made to mitigate the risk of perioperative hyperglycemic events, as elevated blood glucose levels have been found to contribute to intra/postoperative complications. Additionally, hyperglycemic extremes can potentially necessitate the postponing of a patient's elective case in order to better optimize perioperative glycemic control, again with the aforementioned guidelines in place. With this in mind, recommendations have been sought from the prescribing provider, who has cleared patient to proceed with holding the prescribed GLP-1 as per the guidelines from the ASA.   Provider recommending: no further recommendations received from the prescribing provider.  Copy of signed clearance and recommendations placed on patient's chart for inclusion in their medical record and for review by the surgical/anesthetic team on the day of her procedure.   Quentin Mulling, MSN, APRN, FNP-C, CEN [facility]  Perioperative Services Nurse Practitioner Phone: [phone] Fax: [phone] 10/07/23 2:40 PM  NOTE: This note has been prepared using Dragon dictation software. Despite my best ability to proofread, there is always the potential that unintentional transcriptional errors may still occur from this process.

## 2023-10-07 NOTE — Telephone Encounter (Signed)
Surgical clearance faxed back to [facility]; [phone]. Scanned-Toni

## 2023-10-07 NOTE — Telephone Encounter (Signed)
Received surgical clearance from [facility]. Gave to [employer]

## 2023-10-08 ENCOUNTER — Ambulatory Visit
Admission: RE | Admit: 2023-10-08 | Discharge: 2023-10-08 | Disposition: A | Discharge status: Home / Self Care | Payer: 59 | Source: Ambulatory Visit | Attending: Surgery | Admitting: Surgery

## 2023-10-08 ENCOUNTER — Encounter: Payer: Self-pay | Admitting: Surgery

## 2023-10-08 ENCOUNTER — Encounter
Admission: RE | Disposition: A | Discharge status: Home / Self Care | Payer: Self-pay | Source: Ambulatory Visit | Attending: Surgery

## 2023-10-08 ENCOUNTER — Ambulatory Visit: Payer: Self-pay | Admitting: Urgent Care

## 2023-10-08 ENCOUNTER — Other Ambulatory Visit: Payer: Self-pay

## 2023-10-08 DIAGNOSIS — K8070 Calculus of gallbladder and bile duct without cholecystitis without obstruction: Secondary | ICD-10-CM | POA: Diagnosis present

## 2023-10-08 DIAGNOSIS — Z01810 Encounter for preprocedural cardiovascular examination: Secondary | ICD-10-CM

## 2023-10-08 DIAGNOSIS — K8020 Calculus of gallbladder without cholecystitis without obstruction: Secondary | ICD-10-CM | POA: Diagnosis not present

## 2023-10-08 DIAGNOSIS — K8012 Calculus of gallbladder with acute and chronic cholecystitis without obstruction: Secondary | ICD-10-CM | POA: Diagnosis not present

## 2023-10-08 DIAGNOSIS — E1165 Type 2 diabetes mellitus with hyperglycemia: Secondary | ICD-10-CM

## 2023-10-08 HISTORY — PX: SPHINCTEROPLASTY, ANAL: SHX7264

## 2023-10-08 HISTORY — PX: CHOLECYSTECTOMY, ROBOT-ASSISTED, LAPAROSCOPIC: SHX7198

## 2023-10-08 LAB — GLUCOSE, CAPILLARY
Glucose-Capillary: 100 mg/dL — ABNORMAL HIGH (ref 70–99)
Glucose-Capillary: 136 mg/dL — ABNORMAL HIGH (ref 70–99)

## 2023-10-08 SURGERY — XI ROBOTIC ASSISTED LAPAROSCOPIC CHOLECYSTECTOMY
Anesthesia: General | Site: Abdomen

## 2023-10-08 MED ORDER — OXYCODONE HCL 5 MG/5ML PO SOLN: 5.0000 mg | Freq: Once | ORAL | Status: AC | PRN

## 2023-10-08 MED ORDER — MIDAZOLAM HCL 2 MG/2ML IJ SOLN
INTRAMUSCULAR | Status: DC | PRN
  Administered 2023-10-08: 2 mg via INTRAVENOUS

## 2023-10-08 MED ORDER — ONDANSETRON HCL 4 MG/2ML IJ SOLN
INTRAMUSCULAR | Status: DC | PRN
  Administered 2023-10-08: 4 mg via INTRAVENOUS

## 2023-10-08 MED ORDER — ORAL CARE MOUTH RINSE: 15.0000 mL | Freq: Once | OROMUCOSAL | Status: AC

## 2023-10-08 MED ORDER — ONDANSETRON HCL 4 MG/2ML IJ SOLN: 4.0000 mg | Freq: Once | INTRAMUSCULAR | Status: DC | PRN

## 2023-10-08 MED ORDER — BUPIVACAINE LIPOSOME 1.3 % IJ SUSP: 10.0000 mL | Freq: Once | INTRAMUSCULAR | Status: DC

## 2023-10-08 MED ORDER — BUPIVACAINE LIPOSOME 1.3 % IJ SUSP
INTRAMUSCULAR | Status: AC
  Filled 2023-10-08: qty 10

## 2023-10-08 MED ORDER — CEFAZOLIN SODIUM-DEXTROSE 2-4 GM/100ML-% IV SOLN
INTRAVENOUS | Status: AC
  Filled 2023-10-08: qty 100

## 2023-10-08 MED ORDER — PROPOFOL 10 MG/ML IV BOLUS
INTRAVENOUS | Status: DC | PRN
  Administered 2023-10-08: 150 mg via INTRAVENOUS

## 2023-10-08 MED ORDER — BUPIVACAINE-EPINEPHRINE (PF) 0.25% -1:200000 IJ SOLN
INTRAMUSCULAR | Status: AC
  Filled 2023-10-08: qty 30

## 2023-10-08 MED ORDER — KETOROLAC TROMETHAMINE 30 MG/ML IJ SOLN
INTRAMUSCULAR | Status: AC
  Filled 2023-10-08: qty 1

## 2023-10-08 MED ORDER — ROCURONIUM BROMIDE 100 MG/10ML IV SOLN
INTRAVENOUS | Status: DC | PRN
  Administered 2023-10-08: 60 mg via INTRAVENOUS
  Administered 2023-10-08: 20 mg via INTRAVENOUS

## 2023-10-08 MED ORDER — MIDAZOLAM HCL 2 MG/2ML IJ SOLN
INTRAMUSCULAR | Status: AC
  Filled 2023-10-08: qty 2

## 2023-10-08 MED ORDER — LACTATED RINGERS IV SOLN: INTRAVENOUS | Status: DC | PRN

## 2023-10-08 MED ORDER — ACETAMINOPHEN 500 MG PO TABS
ORAL_TABLET | ORAL | Status: AC
  Filled 2023-10-08: qty 2

## 2023-10-08 MED ORDER — KETOROLAC TROMETHAMINE 30 MG/ML IJ SOLN
INTRAMUSCULAR | Status: DC | PRN
  Administered 2023-10-08: 15 mg via INTRAVENOUS

## 2023-10-08 MED ORDER — LIDOCAINE HCL (PF) 2 % IJ SOLN
INTRAMUSCULAR | Status: AC
  Filled 2023-10-08: qty 5

## 2023-10-08 MED ORDER — SUGAMMADEX SODIUM 200 MG/2ML IV SOLN
INTRAVENOUS | Status: DC | PRN
  Administered 2023-10-08: 200 mg via INTRAVENOUS

## 2023-10-08 MED ORDER — LIDOCAINE HCL (CARDIAC) PF 100 MG/5ML IV SOSY
PREFILLED_SYRINGE | INTRAVENOUS | Status: DC | PRN
  Administered 2023-10-08: 100 mg via INTRAVENOUS

## 2023-10-08 MED ORDER — BUPIVACAINE-EPINEPHRINE (PF) 0.25% -1:200000 IJ SOLN
INTRAMUSCULAR | Status: DC | PRN
  Administered 2023-10-08: 30 mL

## 2023-10-08 MED ORDER — CHLORHEXIDINE GLUCONATE 0.12 % MT SOLN
15.0000 mL | Freq: Once | OROMUCOSAL | Status: AC
  Administered 2023-10-08: 15 mL via OROMUCOSAL

## 2023-10-08 MED ORDER — FENTANYL CITRATE (PF) 100 MCG/2ML IJ SOLN
INTRAMUSCULAR | Status: AC
  Filled 2023-10-08: qty 2

## 2023-10-08 MED ORDER — CHLORHEXIDINE GLUCONATE CLOTH 2 % EX PADS: 6.0000 | MEDICATED_PAD | Freq: Once | CUTANEOUS | Status: DC

## 2023-10-08 MED ORDER — DEXAMETHASONE SODIUM PHOSPHATE 10 MG/ML IJ SOLN
INTRAMUSCULAR | Status: DC | PRN
  Administered 2023-10-08: 8 mg via INTRAVENOUS

## 2023-10-08 MED ORDER — SODIUM CHLORIDE 0.9 % IV SOLN: INTRAVENOUS | Status: DC

## 2023-10-08 MED ORDER — PHENYLEPHRINE 80 MCG/ML (10ML) SYRINGE FOR IV PUSH (FOR BLOOD PRESSURE SUPPORT)
PREFILLED_SYRINGE | INTRAVENOUS | Status: DC | PRN
  Administered 2023-10-08 (×4): 100 ug via INTRAVENOUS
  Administered 2023-10-08: 200 ug via INTRAVENOUS

## 2023-10-08 MED ORDER — INDOCYANINE GREEN 25 MG IV SOLR
1.2500 mg | INTRAVENOUS | Status: AC
  Administered 2023-10-08: 1.25 mg via INTRAVENOUS

## 2023-10-08 MED ORDER — GLYCOPYRROLATE 0.2 MG/ML IJ SOLN
INTRAMUSCULAR | Status: DC | PRN
  Administered 2023-10-08: .2 mg via INTRAVENOUS

## 2023-10-08 MED ORDER — GABAPENTIN 300 MG PO CAPS
ORAL_CAPSULE | ORAL | Status: AC
  Filled 2023-10-08: qty 1

## 2023-10-08 MED ORDER — EPHEDRINE SULFATE-NACL 50-0.9 MG/10ML-% IV SOSY
PREFILLED_SYRINGE | INTRAVENOUS | Status: DC | PRN
  Administered 2023-10-08 (×3): 5 mg via INTRAVENOUS

## 2023-10-08 MED ORDER — BUPIVACAINE LIPOSOME 1.3 % IJ SUSP
INTRAMUSCULAR | Status: DC | PRN
  Administered 2023-10-08: 10 mL

## 2023-10-08 MED ORDER — FENTANYL CITRATE (PF) 100 MCG/2ML IJ SOLN
25.0000 ug | INTRAMUSCULAR | Status: DC | PRN
  Administered 2023-10-08: 50 ug via INTRAVENOUS
  Administered 2023-10-08: 25 ug via INTRAVENOUS

## 2023-10-08 MED ORDER — SODIUM CHLORIDE FLUSH 0.9 % IV SOLN
INTRAVENOUS | Status: AC
  Filled 2023-10-08: qty 10

## 2023-10-08 MED ORDER — CHLORHEXIDINE GLUCONATE CLOTH 2 % EX PADS
6.0000 | MEDICATED_PAD | Freq: Once | CUTANEOUS | Status: AC
  Administered 2023-10-08: 6 via TOPICAL

## 2023-10-08 MED ORDER — GLYCOPYRROLATE 0.2 MG/ML IJ SOLN
INTRAMUSCULAR | Status: AC
  Filled 2023-10-08: qty 1

## 2023-10-08 MED ORDER — DEXAMETHASONE SODIUM PHOSPHATE 10 MG/ML IJ SOLN
INTRAMUSCULAR | Status: AC
  Filled 2023-10-08: qty 1

## 2023-10-08 MED ORDER — OXYCODONE HCL 5 MG PO TABS: 5.0000 mg | ORAL_TABLET | ORAL | 0 refills | Status: DC | PRN

## 2023-10-08 MED ORDER — GABAPENTIN 300 MG PO CAPS
300.0000 mg | ORAL_CAPSULE | ORAL | Status: AC
  Administered 2023-10-08: 300 mg via ORAL

## 2023-10-08 MED ORDER — FENTANYL CITRATE (PF) 100 MCG/2ML IJ SOLN
INTRAMUSCULAR | Status: DC | PRN
  Administered 2023-10-08 (×2): 25 ug via INTRAVENOUS

## 2023-10-08 MED ORDER — CHLORHEXIDINE GLUCONATE 0.12 % MT SOLN
OROMUCOSAL | Status: AC
  Filled 2023-10-08: qty 15

## 2023-10-08 MED ORDER — ACETAMINOPHEN 500 MG PO TABS
1000.0000 mg | ORAL_TABLET | ORAL | Status: AC
  Administered 2023-10-08: 1000 mg via ORAL

## 2023-10-08 MED ORDER — OXYCODONE HCL 5 MG PO TABS
ORAL_TABLET | ORAL | Status: AC
  Filled 2023-10-08: qty 1

## 2023-10-08 MED ORDER — OXYCODONE HCL 5 MG PO TABS
5.0000 mg | ORAL_TABLET | Freq: Once | ORAL | Status: AC | PRN
  Administered 2023-10-08: 5 mg via ORAL

## 2023-10-08 MED ORDER — PROPOFOL 10 MG/ML IV BOLUS
INTRAVENOUS | Status: AC
  Filled 2023-10-08: qty 20

## 2023-10-08 MED ORDER — CEFAZOLIN SODIUM-DEXTROSE 2-4 GM/100ML-% IV SOLN
2.0000 g | INTRAVENOUS | Status: AC
  Administered 2023-10-08: 2 g via INTRAVENOUS

## 2023-10-08 MED ORDER — ONDANSETRON HCL 4 MG/2ML IJ SOLN
INTRAMUSCULAR | Status: AC
  Filled 2023-10-08: qty 2

## 2023-10-08 SURGICAL SUPPLY — 35 items
BAG PRESSURE INF REUSE 1000 (BAG)
CANNULA CAP OBTURATR AIRSEAL 8 (CAP) ×2
CAUTERY HOOK MNPLR 1.6 DVNC XI (INSTRUMENTS) ×2
CLIP LIGATING HEMO O LOK GREEN (MISCELLANEOUS) ×2
DERMABOND ADVANCED .7 DNX12 (GAUZE/BANDAGES/DRESSINGS) ×2
DRAPE ARM DVNC X/XI (DISPOSABLE) ×8
DRAPE COLUMN DVNC XI (DISPOSABLE) ×2
ELECT CAUTERY BLADE TIP 2.5 (TIP) ×2
ELECT REM PT RETURN 9FT ADLT (ELECTROSURGICAL) ×2
FORCEPS BPLR R/ABLATION 8 DVNC (INSTRUMENTS) ×2
FORCEPS PROGRASP DVNC XI (FORCEP) ×2
GLOVE SURG SYN 7.0 (GLOVE) ×6
GLOVE SURG SYN 7.5 E (GLOVE) ×6
GOWN STRL REUS W/ TWL LRG LVL3 (GOWN DISPOSABLE) ×6
IRRIGATOR SUCT 8 DISP DVNC XI (IRRIGATION / IRRIGATOR)
IV NS 1000ML BAXH (IV SOLUTION)
KIT PINK PAD W/HEAD ARE REST (MISCELLANEOUS) ×2
LABEL OR SOLS (LABEL)
MANIFOLD NEPTUNE II (INSTRUMENTS)
NDL HYPO 22X1.5 SAFETY MO (MISCELLANEOUS) ×2 IMPLANT
NEEDLE HYPO 22X1.5 SAFETY MO (MISCELLANEOUS) ×2
NS IRRIG 500ML POUR BTL (IV SOLUTION) ×2
OBTURATOR OPTICAL STND 8 DVNC (TROCAR) ×2
PACK LAP CHOLECYSTECTOMY (MISCELLANEOUS) ×2
SEAL UNIV 5-12 XI (MISCELLANEOUS) ×8
SET TUBE FILTERED XL AIRSEAL (SET/KITS/TRAYS/PACK) ×2
SET TUBE SMOKE EVAC HIGH FLOW (TUBING)
SOL ELECTROSURG ANTI STICK (MISCELLANEOUS) ×2
SPIKE FLUID TRANSFER (MISCELLANEOUS)
SUT MNCRL AB 4-0 PS2 18 (SUTURE) ×2
SUT VIC AB 3-0 SH 27X BRD (SUTURE) ×2
SUT VIC AB 4-0 SH 27XANBCTRL (SUTURE) ×2
SUT VICRYL 0 UR6 27IN ABS (SUTURE)
SYS BAG RETRIEVAL 10MM (BASKET) ×2
WATER STERILE IRR 500ML POUR (IV SOLUTION)

## 2023-10-08 NOTE — Interval H&P Note (Signed)
 History and Physical Interval Note:  10/08/2023 9:43 AM  Ashley Stephens  has presented today for surgery, with the diagnosis of symptomatic cholelithiasis.  The various methods of treatment have been discussed with the patient and family. After consideration of risks, benefits and other options for treatment, the patient has consented to  Procedure(s): XI ROBOTIC ASSISTED LAPAROSCOPIC CHOLECYSTECTOMY (N/A) INDOCYANINE GREEN  FLUORESCENCE IMAGING (ICG) (N/A) as a surgical intervention.  The patient's history has been reviewed, patient examined, no change in status, stable for surgery.  I have reviewed the patient's chart and labs.  Questions were answered to the patient's satisfaction.     Ashley Stephens

## 2023-10-08 NOTE — Transfer of Care (Signed)
 Immediate Anesthesia Transfer of Care Note  Patient: Ashley Stephens  Procedure(s) Performed: XI ROBOTIC ASSISTED LAPAROSCOPIC CHOLECYSTECTOMY (Abdomen) INDOCYANINE GREEN  FLUORESCENCE IMAGING (ICG)  Patient Location: PACU  Anesthesia Type:General  Level of Consciousness: drowsy and patient cooperative  Airway & Oxygen Therapy: Patient Spontanous Breathing and Patient connected to face mask oxygen  Post-op Assessment: Report given to RN and Post -op Vital signs reviewed and stable  Post vital signs: Reviewed and stable  Last Vitals:  Vitals Value Taken Time  BP 122/77 10/08/23 1151  Temp    Pulse 74 10/08/23 1154  Resp 17 10/08/23 1154  SpO2 99 % 10/08/23 1154  Vitals shown include unfiled device data.  Last Pain:  Vitals:   10/08/23 0921  TempSrc: Temporal  PainSc: 0-No pain         Complications:  Encounter Notable Events  Notable Event Outcome Phase Comment  Difficult to intubate - unexpected  Intraprocedure Filed from anesthesia note documentation.

## 2023-10-08 NOTE — Anesthesia Preprocedure Evaluation (Signed)
Anesthesia Evaluation  Patient identified by MRN, date of birth, ID band Patient awake    Reviewed: Allergy & Precautions, NPO status , Patient's Chart, lab work & pertinent test results  History of Anesthesia Complications Negative for: history of anesthetic complications  Airway Mallampati: III  TM Distance: >3 FB Neck ROM: Full    Dental no notable dental hx. (+) Teeth Intact   Pulmonary sleep apnea , neg COPD, Patient abstained from smoking.Not current smoker   Pulmonary exam normal breath sounds clear to auscultation       Cardiovascular Exercise Tolerance: Good METS(-) hypertension(-) CAD and (-) Past MI negative cardio ROS (-) dysrhythmias  Rhythm:Regular Rate:Normal - Systolic murmurs    Neuro/Psych  PSYCHIATRIC DISORDERS Anxiety Depression    negative neurological ROS     GI/Hepatic PUD,GERD  Controlled and Medicated,,(+)     (-) substance abuse    Endo/Other  diabetes, Well ControlledHypothyroidism  On GLP1RA, last taken 1.5 weeks ago. Denies GI symptoms today  Renal/GU negative Renal ROS     Musculoskeletal   Abdominal  (+) + obese  Peds  Hematology   Anesthesia Other Findings Past Medical History: No date: Anxiety No date: B12 deficiency No date: DDD (degenerative disc disease), lumbar No date: Depression No date: DM (diabetes mellitus), type 2 (HCC) No date: Gastric ulcer No date: GERD (gastroesophageal reflux disease) No date: History of Roux-en-Y gastric bypass No date: Hypothyroidism No date: Obesity No date: Osteoarthritis No date: Sleep apnea     Comment:  h/o OSA but never used cpap No date: Symptomatic cholelithiasis No date: Thyroid disorder  Reproductive/Obstetrics                             Anesthesia Physical Anesthesia Plan  ASA: 2  Anesthesia Plan: General   Post-op Pain Management: Tylenol PO (pre-op)*, Gabapentin PO (pre-op)* and Toradol IV  (intra-op)*   Induction: Intravenous  PONV Risk Score and Plan: 4 or greater and Ondansetron, Dexamethasone and Midazolam  Airway Management Planned: Oral ETT and Video Laryngoscope Planned  Additional Equipment: None  Intra-op Plan:   Post-operative Plan: Extubation in OR  Informed Consent: I have reviewed the patients History and Physical, chart, labs and discussed the procedure including the risks, benefits and alternatives for the proposed anesthesia with the patient or authorized representative who has indicated his/her understanding and acceptance.     Dental advisory given  Plan Discussed with: CRNA and Surgeon  Anesthesia Plan Comments: (Discussed risks of anesthesia with patient, including PONV, sore throat, lip/dental/eye damage. Rare risks discussed as well, such as cardiorespiratory and neurological sequelae, and allergic reactions. Discussed the role of CRNA in patient's perioperative care. Patient understands.)       Anesthesia Quick Evaluation

## 2023-10-08 NOTE — Op Note (Signed)
  Procedure Date:  10/08/2023  Pre-operative Diagnosis:  Symptomatic cholelithiasis  Post-operative Diagnosis: Symptomatic cholelithiasis  Procedure:  Robotic assisted cholecystectomy with ICG FireFly cholangiogram  Surgeon:  Aloysius Sheree Plant, MD  Anesthesia:  General endotracheal  Estimated Blood Loss:  10 ml  Specimens:  gallbladder  Complications:  None  Indications for Procedure:  This is a 48 y.o. female who presents with abdominal pain and workup revealing symptomatic cholelithiasis.  The benefits, complications, treatment options, and expected outcomes were discussed with the patient. The risks of bleeding, infection, recurrence of symptoms, failure to resolve symptoms, bile duct damage, bile duct leak, retained common bile duct stone, bowel injury, and need for further procedures were all discussed with the patient and she was willing to proceed.  Description of Procedure: The patient was correctly identified in the preoperative area and brought into the operating room.  The patient was placed supine with VTE prophylaxis in place.  Appropriate time-outs were performed.  Anesthesia was induced and the patient was intubated.  Appropriate antibiotics were infused.  The abdomen was prepped and draped in a sterile fashion. An infraumbilical incision was made. A cutdown technique was used to enter the abdominal cavity without injury, and a 12 mm robotic port was inserted.  Pneumoperitoneum was obtained with appropriate opening pressures.  Three 8-mm ports were placed in the mid abdomen at the level of the umbilicus under direct visualization.  The DaVinci platform was docked, camera targeted, and instruments were placed under direct visualization.  The gallbladder was identified.  It was very distended, requiring laparoscopic needle decompression.  The fundus was then grasped and retracted cephalad.  Adhesions were lysed bluntly and with electrocautery. The infundibulum was grasped and  retracted laterally, exposing the peritoneum overlying the gallbladder.  This was incised with electrocautery and extended on either side of the gallbladder.  FireFly cholangiogram was then obtained, and we were able to clearly identify the cystic duct and common bile duct.  The cystic duct and cystic artery were carefully dissected with combination of cautery and blunt dissection.  The cystic artery branched early, but was able to dissect it without complications.  Both were clipped twice proximally and once distally, cutting in between.  The gallbladder was taken from the gallbladder fossa in a retrograde fashion with electrocautery. The gallbladder was placed in an Endocatch bag. The liver bed was inspected and any bleeding was controlled with electrocautery. The right upper quadrant was then inspected again revealing intact clips, no bleeding, and no ductal injury.  The 8 mm ports were removed under direct visualization and the 12 mm port was removed.  The Endocatch bag was brought out via the umbilical incision. The fascial opening was closed using 0 vicryl suture.  Local anesthetic was infused in all incisions and the incisions were closed with 4-0 Monocryl.  The wounds were cleaned and sealed with DermaBond.  The patient was emerged from anesthesia and extubated and brought to the recovery room for further management.  The patient tolerated the procedure well and all counts were correct at the end of the case.   Aloysius Sheree Plant, MD

## 2023-10-08 NOTE — Discharge Instructions (Signed)
Discharge Instructions: 1.  Patient may shower, but do not scrub wounds heavily and dab dry only. 2.  Do not submerge wounds in pool/tub until fully healed. 3.  Do not apply ointments or hydrogen peroxide to the wounds. 4.  May apply ice packs to the wounds for comfort. 5.  Do not drive while taking narcotics for pain control.  Prior to driving, make sure you are able to rotate right and left to look at blindspots without significant pain or discomfort. 6.  No heavy lifting or pushing of more than 10-15 lbs for 4 weeks.

## 2023-10-08 NOTE — Anesthesia Procedure Notes (Addendum)
 Procedure Name: Intubation Date/Time: 10/08/2023 10:15 AM  Performed by: Nada Corean CROME, CRNAPre-anesthesia Checklist: Patient identified, Emergency Drugs available, Suction available, Patient being monitored and Timeout performed Patient Re-evaluated:Patient Re-evaluated prior to induction Oxygen Delivery Method: Circle system utilized Preoxygenation: Pre-oxygenation with 100% oxygen Induction Type: IV induction Ventilation: Two handed mask ventilation required and Oral airway inserted - appropriate to patient size Laryngoscope Size: Mac and 3 Grade View: Grade I Tube type: Oral Tube size: 6.5 mm Number of attempts: 1 Airway Equipment and Method: Stylet and Video-laryngoscopy Placement Confirmation: ETT inserted through vocal cords under direct vision, positive ETCO2 and breath sounds checked- equal and bilateral Secured at: 20 cm Tube secured with: Tape Dental Injury: Teeth and Oropharynx as per pre-operative assessment  Difficulty Due To: Difficult Airway- due to anterior larynx and Difficulty was unanticipated Comments: Difficult mask ventilation, requiring oral airway and two-handed mask ventilation utilizing heavy strength to keep good seal and jaw thrust.

## 2023-10-09 ENCOUNTER — Encounter: Payer: Self-pay | Admitting: Surgery

## 2023-10-09 LAB — SURGICAL PATHOLOGY

## 2023-10-09 NOTE — Anesthesia Postprocedure Evaluation (Signed)
 Anesthesia Post Note  Patient: Ashley Stephens  Procedure(s) Performed: XI ROBOTIC ASSISTED LAPAROSCOPIC CHOLECYSTECTOMY (Abdomen) INDOCYANINE GREEN  FLUORESCENCE IMAGING (ICG)  Patient location during evaluation: PACU Anesthesia Type: General Level of consciousness: awake and alert Pain management: pain level controlled Vital Signs Assessment: post-procedure vital signs reviewed and stable Respiratory status: spontaneous breathing, nonlabored ventilation, respiratory function stable and patient connected to nasal cannula oxygen Cardiovascular status: blood pressure returned to baseline and stable Postop Assessment: no apparent nausea or vomiting Anesthetic complications: yes   Encounter Notable Events  Notable Event Outcome Phase Comment  Difficult to intubate - unexpected  Intraprocedure Filed from anesthesia note documentation.     Last Vitals:  Vitals:   10/08/23 1310 10/08/23 1336  BP:  132/73  Pulse: 80   Resp: 16 18  Temp:  (!) 36.2 C  SpO2: 96% 99%    Last Pain:  Vitals:   10/08/23 1336  TempSrc:   PainSc: 0-No pain                 Rome Ade

## 2023-10-10 ENCOUNTER — Encounter: Payer: Self-pay | Admitting: Surgery

## 2023-10-16 ENCOUNTER — Other Ambulatory Visit: Payer: Self-pay | Admitting: Physician Assistant

## 2023-10-16 DIAGNOSIS — E039 Hypothyroidism, unspecified: Secondary | ICD-10-CM

## 2023-10-21 ENCOUNTER — Telehealth: Payer: Self-pay | Admitting: *Deleted

## 2023-10-21 NOTE — Telephone Encounter (Signed)
Faxed FMLA to [employer] at [phone]

## 2023-10-22 ENCOUNTER — Encounter: Payer: Self-pay | Admitting: Physician Assistant

## 2023-10-22 ENCOUNTER — Ambulatory Visit (INDEPENDENT_AMBULATORY_CARE_PROVIDER_SITE_OTHER): Payer: 59 | Admitting: Physician Assistant

## 2023-10-22 VITALS — BP 130/80 | HR 82 | Temp 98.1°F | Ht 67.0 in | Wt 243.6 lb

## 2023-10-22 DIAGNOSIS — K8020 Calculus of gallbladder without cholecystitis without obstruction: Secondary | ICD-10-CM

## 2023-10-22 DIAGNOSIS — Z09 Encounter for follow-up examination after completed treatment for conditions other than malignant neoplasm: Secondary | ICD-10-CM

## 2023-10-22 NOTE — Patient Instructions (Signed)

## 2023-10-22 NOTE — Progress Notes (Unsigned)
[facility] POST-OP OFFICE VISIT  10/22/2023  HPI: Ashley Stephens is a 48 y.o. female 14 days s/p robotic assisted laparoscopic cholecystectomy for symptomatic cholelithiasis with Dr Aleen Campi   She is overall doing extremely well Her main issue is fatigue; just feels more tired No significant abdominal pain; minimal residual soreness at this time No fever, chills, nausea, emesis Tolerating PO; no diarrhea Ambulating well Incisions are healing without incident   Vital signs: BP 130/80   Pulse 82   Temp 98.1 F (36.7 C) (Oral)   Ht 5\' 7"  (1.702 m)   Wt 243 lb 9.6 oz (110.5 kg)   SpO2 98%   BMI 38.15 kg/m    Physical Exam: Constitutional: Well appearing female, NAD Abdomen: Soft, some RUQ soreness and umbilical soreness with palpation, non-distended, no rebound/guarding Skin: Laparoscopic incisions are healing well, no erythema or drainage   Assessment/Plan: This is a 48 y.o. female 14 days s/p robotic assisted laparoscopic cholecystectomy for symptomatic cholelithiasis with Dr Aleen Campi    - Encouraged her that she is recovering extremely well. She was anxious that because she was not having diarrhea (which she expected) that something was wrong. I reviewed etiology of diarrhea after cholecystectomy and that her body has likely adjusted without issue. She understands that she may (or may not) get occasional diarrhea with fatty foods but this is expected.   - Fatigue may just be sequela of surgery itself but this is rather broad complaint. She is without jaundice/fever nor any other sign/symptom of complication fro cholecystectomy   - Pain control prn; OTC modalities   - Reviewed wound care recommendation  - Reviewed lifting restrictions; 4 weeks total  - Reviewed surgical pathology; Acute on chronic cholecystitis   - She can follow up on as needed basis; She understands to call with questions/concerns  -- Lynden Oxford, PA-C [facility] 10/22/2023, 3:16 PM M-F: 7am - 4pm

## 2023-10-23 ENCOUNTER — Encounter: Payer: 59 | Admitting: Physician Assistant

## 2023-11-05 ENCOUNTER — Telehealth: Payer: Self-pay | Admitting: Psychiatry

## 2023-11-05 NOTE — Telephone Encounter (Signed)
 LF 1/29, has RF available.

## 2023-11-05 NOTE — Telephone Encounter (Signed)
 [facility] called 4:54 pm needing add'l   information about Pt Alprazolam Rx. RTC [phone]

## 2023-11-05 NOTE — Telephone Encounter (Signed)
 Called pharmacy and provided information.   Admin:  Please call and see if she is going to follow with Shanda Bumps and if so does she have appt.

## 2023-11-06 NOTE — Telephone Encounter (Signed)
 Called pt and lvm to rtc

## 2023-11-29 ENCOUNTER — Telehealth: Admitting: Physician Assistant

## 2023-11-29 ENCOUNTER — Ambulatory Visit

## 2023-11-29 DIAGNOSIS — Z20818 Contact with and (suspected) exposure to other bacterial communicable diseases: Secondary | ICD-10-CM | POA: Diagnosis not present

## 2023-11-29 DIAGNOSIS — J029 Acute pharyngitis, unspecified: Secondary | ICD-10-CM | POA: Diagnosis not present

## 2023-11-29 MED ORDER — AZITHROMYCIN 250 MG PO TABS: ORAL_TABLET | ORAL | 0 refills | Status: AC

## 2023-11-29 NOTE — Progress Notes (Signed)
 Virtual Visit Consent   Ashley Stephens, you are scheduled for a virtual visit with a [facility] provider today. Just as with appointments in the office, your consent must be obtained to participate. Your consent will be active for this visit and any virtual visit you may have with one of our providers in the next 365 days. If you have a MyChart account, a copy of this consent can be sent to you electronically.  As this is a virtual visit, video technology does not allow for your provider to perform a traditional examination. This may limit your provider's ability to fully assess your condition. If your provider identifies any concerns that need to be evaluated in person or the need to arrange testing (such as labs, EKG, etc.), we will make arrangements to do so. Although advances in technology are sophisticated, we cannot ensure that it will always work on either your end or our end. If the connection with a video visit is poor, the visit may have to be switched to a telephone visit. With either a video or telephone visit, we are not always able to ensure that we have a secure connection.  By engaging in this virtual visit, you consent to the provision of healthcare and authorize for your insurance to be billed (if applicable) for the services provided during this visit. Depending on your insurance coverage, you may receive a charge related to this service.  I need to obtain your verbal consent now. Are you willing to proceed with your visit today? Ashley Stephens has provided verbal consent on 11/29/2023 for a virtual visit (video or telephone). Ashley Stephens, [place]  Date: 11/29/2023 6:20 PM   Virtual Visit via Video Note   I, Ashley Stephens, connected with  Ashley Stephens  ([id number], September 09, 1975) on 11/29/23 at  6:30 PM EDT by a video-enabled telemedicine application and verified that I am speaking with the correct person using two  identifiers.  Location: Patient: Virtual Visit Location Patient: Home Provider: Virtual Visit Location Provider: Home Office   I discussed the limitations of evaluation and management by telemedicine and the availability of in person appointments. The patient expressed understanding and agreed to proceed.    History of Present Illness: Ashley Stephens is a 48 y.o. who identifies as a female who was assigned female at birth, and is being seen today for severe sore throat with swollen lymph nodes. Initially thought was related to allergies but with continued progression of sore throat and lymph node swelling, now with fever, chills. Notes known exposure to strep throat.   OTC -- Tylenol, Advil Cold and Sinus.  HPI: HPI  Problems:  Patient Active Problem List   Diagnosis Date Noted   Symptomatic cholelithiasis 10/08/2023   Anastomotic stricture of esophagojejunostomy 05/14/2021   Anastomotic ulcer S/P gastric bypass 05/14/2021   Gastrointestinal bleeding 03/12/2021   History of Roux-en-Y gastric bypass 03/12/2021   Depression with anxiety 03/12/2021   Major depressive disorder, recurrent episode, severe (HCC) 01/27/2020   DDD (degenerative disc disease), lumbar 12/16/2019   B12 deficiency 12/16/2019   Encounter for general adult medical examination with abnormal findings 07/12/2019   Borderline diabetes 07/12/2019   Gastroesophageal reflux disease without esophagitis 07/12/2019   Encounter for breast cancer screening other than mammogram 07/12/2019   Dysuria 07/12/2019   Lumbar pain 11/06/2018   Mild mood disorder (HCC) 06/07/2018   Generalized anxiety disorder 06/07/2018   Type 2 diabetes mellitus with hyperglycemia, without long-term current use of  insulin (HCC) 12/14/2017   Impaired fasting glucose 12/14/2017   Fatigue 12/14/2017   Abnormal weight gain 12/14/2017   Vitamin D deficiency 12/14/2017   Acquired hypothyroidism 12/14/2017    Allergies: No Known  Allergies Medications:  Current Outpatient Medications:    azithromycin (ZITHROMAX) 250 MG tablet, Take 2 tablets on day 1, then 1 tablet daily on days 2 through 5, Disp: 6 tablet, Rfl: 0   acetaminophen (TYLENOL) 650 MG CR tablet, Take 1,300 mg by mouth every 8 (eight) hours as needed for pain., Disp: , Rfl:    Adapalene-Benzoyl Peroxide 0.3-2.5 % GEL, Apply 1 Application topically daily as needed (acne)., Disp: , Rfl:    ALPRAZolam (XANAX) 0.25 MG tablet, Take 1 tablet (0.25 mg total) by mouth 2 (two) times daily as needed. for anxiety, Disp: 45 tablet, Rfl: 4   Ascorbic Acid (VITAMIN C) 1000 MG tablet, Take 1,000 mg by mouth daily., Disp: , Rfl:    Cholecalciferol (VITAMIN D3 ULTRA STRENGTH) 125 MCG (5000 UT) capsule, Take 5,000 Units by mouth daily., Disp: , Rfl:    clindamycin (CLINDAGEL) 1 % gel, Apply 1 Application topically daily as needed (acne)., Disp: , Rfl:    Cyanocobalamin (B-12) 2500 MCG TABS, Take 2,500 mcg by mouth daily., Disp: , Rfl:    desvenlafaxine (PRISTIQ) 100 MG 24 hr tablet, Take 1 tablet (100 mg total) by mouth every morning., Disp: 90 tablet, Rfl: 1   desvenlafaxine (PRISTIQ) 50 MG 24 hr tablet, Take 1 tablet (50 mg total) by mouth at bedtime., Disp: 90 tablet, Rfl: 1   ibuprofen (ADVIL) 200 MG tablet, Take 400 mg by mouth every 6 (six) hours as needed for moderate pain (pain score 4-6)., Disp: , Rfl:    levothyroxine (SYNTHROID) 25 MCG tablet, TAKE 1 TABLET BY MOUTH BEFORE BREAKFAST, Disp: 90 tablet, Rfl: 0   Multiple Vitamin (MULTIVITAMIN WITH MINERALS) TABS tablet, Take 1 tablet by mouth daily., Disp: , Rfl:    omeprazole (PRILOSEC) 40 MG capsule, Take 1 capsule (40 mg total) by mouth daily. (Patient taking differently: Take 40 mg by mouth daily as needed (acid reflux).), Disp: 30 capsule, Rfl: 6   Oxcarbazepine (TRILEPTAL) 300 MG tablet, Take 1 tablet (300 mg total) by mouth 2 (two) times daily., Disp: 180 tablet, Rfl: 1   propranolol (INDERAL) 20 MG tablet, Take 1  tablet (20 mg total) by mouth 3 (three) times daily as needed. (Patient taking differently: Take 20 mg by mouth 3 (three) times daily.), Disp: 90 tablet, Rfl: 5   rosuvastatin (CRESTOR) 5 MG tablet, Take 1 tablet by mouth once daily (Patient taking differently: Take 5 mg by mouth at bedtime.), Disp: 30 tablet, Rfl: 6   Semaglutide, 2 MG/DOSE, (OZEMPIC, 2 MG/DOSE,) 8 MG/3ML SOPN, INJECT 2 MG SUBCUTANEOUSLY  ONCE A WEEK, Disp: 3 mL, Rfl: 3   spironolactone (ALDACTONE) 100 MG tablet, Take 100 mg by mouth 2 (two) times daily., Disp: , Rfl:    Zinc 50 MG TABS, Take 50 mg by mouth daily., Disp: , Rfl:   Observations/Objective: Patient is well-developed, well-nourished in no acute distress.  Resting comfortably at home.  Head is normocephalic, atraumatic.  No labored breathing. Speech is clear and coherent with logical content.  Patient is alert and oriented at baseline.   Assessment and Plan: 1. Streptococcus exposure (Primary) - azithromycin (ZITHROMAX) 250 MG tablet; Take 2 tablets on day 1, then 1 tablet daily on days 2 through 5  Dispense: 6 tablet; Refill: 0  2. Pharyngitis, unspecified  etiology - azithromycin (ZITHROMAX) 250 MG tablet; Take 2 tablets on day 1, then 1 tablet daily on days 2 through 5  Dispense: 6 tablet; Refill: 0  Supportive measures and OTC medications reviewed. Giving close exposure and sore throat/fever as predominate symptoms, will treat to cover strep. Azithromycin per orders. Follow-up in person for any non-resolving, new or worsening symptoms.   Follow Up Instructions: I discussed the assessment and treatment plan with the patient. The patient was provided an opportunity to ask questions and all were answered. The patient agreed with the plan and demonstrated an understanding of the instructions.  A copy of instructions were sent to the patient via MyChart unless otherwise noted below.   The patient was advised to call back or seek an in-person evaluation if the  symptoms worsen or if the condition fails to improve as anticipated.    Ashley Climes, PA-C

## 2023-11-29 NOTE — Patient Instructions (Signed)
 Ashley Stephens, thank you for joining [name], PA-C for today's virtual visit.  While this provider is not your primary care provider (PCP), if your PCP is located in our provider database this encounter information will be shared with them immediately following your visit.   A [facility] MyChart account gives you access to today's visit and all your visits, tests, and labs performed at [facility] " click here if you don't have a [facility] MyChart account or go to [email or web address]  Consent: (Patient) Ashley Stephens provided verbal consent for this virtual visit at the beginning of the encounter.  Current Medications:  Current Outpatient Medications:    acetaminophen (TYLENOL) 650 MG CR tablet, Take 1,300 mg by mouth every 8 (eight) hours as needed for pain., Disp: , Rfl:    Adapalene-Benzoyl Peroxide 0.3-2.5 % GEL, Apply 1 Application topically daily as needed (acne)., Disp: , Rfl:    ALPRAZolam (XANAX) 0.25 MG tablet, Take 1 tablet (0.25 mg total) by mouth 2 (two) times daily as needed. for anxiety, Disp: 45 tablet, Rfl: 4   Ascorbic Acid (VITAMIN C) 1000 MG tablet, Take 1,000 mg by mouth daily., Disp: , Rfl:    Cholecalciferol (VITAMIN D3 ULTRA STRENGTH) 125 MCG (5000 UT) capsule, Take 5,000 Units by mouth daily., Disp: , Rfl:    clindamycin (CLINDAGEL) 1 % gel, Apply 1 Application topically daily as needed (acne)., Disp: , Rfl:    Cyanocobalamin (B-12) 2500 MCG TABS, Take 2,500 mcg by mouth daily., Disp: , Rfl:    desvenlafaxine (PRISTIQ) 100 MG 24 hr tablet, Take 1 tablet (100 mg total) by mouth every morning., Disp: 90 tablet, Rfl: 1   desvenlafaxine (PRISTIQ) 50 MG 24 hr tablet, Take 1 tablet (50 mg total) by mouth at bedtime., Disp: 90 tablet, Rfl: 1   ibuprofen (ADVIL) 200 MG tablet, Take 400 mg by mouth every 6 (six) hours as needed for moderate pain (pain score 4-6)., Disp: , Rfl:    levothyroxine (SYNTHROID) 25 MCG tablet, TAKE 1  TABLET BY MOUTH BEFORE BREAKFAST, Disp: 90 tablet, Rfl: 0   Multiple Vitamin (MULTIVITAMIN WITH MINERALS) TABS tablet, Take 1 tablet by mouth daily., Disp: , Rfl:    omeprazole (PRILOSEC) 40 MG capsule, Take 1 capsule (40 mg total) by mouth daily. (Patient taking differently: Take 40 mg by mouth daily as needed (acid reflux).), Disp: 30 capsule, Rfl: 6   Oxcarbazepine (TRILEPTAL) 300 MG tablet, Take 1 tablet (300 mg total) by mouth 2 (two) times daily., Disp: 180 tablet, Rfl: 1   propranolol (INDERAL) 20 MG tablet, Take 1 tablet (20 mg total) by mouth 3 (three) times daily as needed. (Patient taking differently: Take 20 mg by mouth 3 (three) times daily.), Disp: 90 tablet, Rfl: 5   rosuvastatin (CRESTOR) 5 MG tablet, Take 1 tablet by mouth once daily (Patient taking differently: Take 5 mg by mouth at bedtime.), Disp: 30 tablet, Rfl: 6   Semaglutide, 2 MG/DOSE, (OZEMPIC, 2 MG/DOSE,) 8 MG/3ML SOPN, INJECT 2 MG SUBCUTANEOUSLY  ONCE A WEEK, Disp: 3 mL, Rfl: 3   spironolactone (ALDACTONE) 100 MG tablet, Take 100 mg by mouth 2 (two) times daily., Disp: , Rfl:    Zinc 50 MG TABS, Take 50 mg by mouth daily., Disp: , Rfl:    Medications ordered in this encounter:  No orders of the defined types were placed in this encounter.    *If you need refills on other medications prior to your next appointment, please contact your  pharmacy*  Follow-Up: Call back or seek an in-person evaluation if the symptoms worsen or if the condition fails to improve as anticipated.  [facility] [phone]  Other Instructions Strep Throat, Adult Strep throat is an infection of the throat. It is caused by germs (bacteria). Strep throat is common during the cold months of the year. It mostly affects children who are 28-51 years old. However, people of all ages can get it at any time of the year. This infection spreads from person to person through coughing, sneezing, or having close contact. What are the  causes? This condition is caused by the Streptococcus pyogenes germ. What increases the risk? You care for young children. Children are more likely to get strep throat and may spread it to others. You go to crowded places. Germs can spread easily in such places. You kiss or touch someone who has strep throat. What are the signs or symptoms? Fever or chills. Redness, swelling, or pain in the tonsils or throat. Pain or trouble when swallowing. White or yellow spots on the tonsils or throat. Tender glands in the neck and under the jaw. Bad breath. Red rash all over the body. This is rare. How is this treated? Medicines that kill germs (antibiotics). Medicines that treat pain or fever. These include: Ibuprofen or acetaminophen. Aspirin, only for people who are over the age of 31. Cough drops. Throat sprays. Follow these instructions at home: Medicines  Take over-the-counter and prescription medicines only as told by your doctor. Take your antibiotic medicine as told by your doctor. Do not stop taking the antibiotic even if you start to feel better. Eating and drinking  If you have trouble swallowing, eat soft foods until your throat feels better. Drink enough fluid to keep your pee (urine) pale yellow. To help with pain, you may have: Warm fluids, such as soup and tea. Cold fluids, such as frozen desserts or popsicles. General instructions Rinse your mouth (gargle) with a salt-water mixture 3-4 times a day or as needed. To make a salt-water mixture, dissolve -1 tsp (3-6 g) of salt in 1 cup (237 mL) of warm water. Rest as much as you can. Stay home from work or school until you have been taking antibiotics for 24 hours. Do not smoke or use any products that contain nicotine or tobacco. If you need help quitting, ask your doctor. Keep all follow-up visits. How is this prevented?  Do not share food, drinking cups, or personal items. They can cause the germs to spread. Wash your  hands well with soap and water. Make sure that all people in your house wash their hands well. Have family members tested if they have a fever or a sore throat. They may need an antibiotic if they have strep throat. Contact a doctor if: You have swelling in your neck that keeps getting bigger. You get a rash, cough, or earache. You cough up a thick fluid that is green, yellow-brown, or bloody. You have pain that does not get better with medicine. Your symptoms get worse instead of getting better. You have a fever. Get help right away if: You vomit. You have a very bad headache. Your neck hurts or feels stiff. You have chest pain or are short of breath. You have drooling, very bad throat pain, or changes in your voice. Your neck is swollen, or the skin gets red and tender. Your mouth is dry, or you are peeing less than normal. You keep feeling more  tired or have trouble waking up. Your joints are red or painful. These symptoms may be an emergency. Do not wait to see if the symptoms will go away. Get help right away. Call your local emergency services (911 in the [place] Summary Strep throat is an infection of the throat. It is caused by germs (bacteria). This infection can spread from person to person through coughing, sneezing, or having close contact. Take your medicines, including antibiotics, as told by your doctor. Do not stop taking the antibiotic even if you start to feel better. To prevent the spread of germs, wash your hands well with soap and water. Have others do the same. Do not share food, drinking cups, or personal items. Get help right away if you have a bad headache, chest pain, shortness of breath, a stiff or painful neck, or you vomit. This information is not intended to replace advice given to you by your health care provider. Make sure you discuss any questions you have with your health care provider. Document Revised: 12/13/2020 Document Reviewed: 12/13/2020 Elsevier  Patient Education  2024 Elsevier Inc.   If you have been instructed to have an in-person evaluation today at a local Urgent Care facility, please use the link below. It will take you to a list of all of our available [facility], including address, phone number and hours of operation. Please do not delay care.  [facility]  If you or a family member do not have a primary care provider, use the link below to schedule a visit and establish care. When you choose a [facility] primary care physician or advanced practice provider, you gain a long-term partner in health. Find a Primary Care Provider  Learn more about [facility]'s in-office and virtual care options:  - Get Care Now

## 2023-12-04 NOTE — Telephone Encounter (Signed)
 Called Pt again. LVM to RTC. Follow Jessica or stay at [facility].

## 2023-12-23 ENCOUNTER — Other Ambulatory Visit: Payer: Self-pay | Admitting: Physician Assistant

## 2023-12-23 DIAGNOSIS — E1165 Type 2 diabetes mellitus with hyperglycemia: Secondary | ICD-10-CM

## 2024-01-13 ENCOUNTER — Other Ambulatory Visit: Payer: Self-pay | Admitting: Physician Assistant

## 2024-01-13 DIAGNOSIS — E039 Hypothyroidism, unspecified: Secondary | ICD-10-CM

## 2024-01-21 ENCOUNTER — Other Ambulatory Visit: Payer: Self-pay | Admitting: Physician Assistant

## 2024-01-21 DIAGNOSIS — E1165 Type 2 diabetes mellitus with hyperglycemia: Secondary | ICD-10-CM

## 2024-01-21 NOTE — Telephone Encounter (Signed)
 Next 6/25 please review ok to send

## 2024-02-03 ENCOUNTER — Encounter: Payer: 59 | Admitting: Physician Assistant

## 2024-02-06 ENCOUNTER — Encounter: Payer: 59 | Admitting: Physician Assistant

## 2024-02-10 ENCOUNTER — Telehealth: Payer: Self-pay | Admitting: Physician Assistant

## 2024-02-10 NOTE — Telephone Encounter (Signed)
 Lvm & sent message to reschedule 02/06/2024 missed appointment-Toni

## 2024-02-12 ENCOUNTER — Telehealth: Payer: Self-pay | Admitting: Physician Assistant

## 2024-02-12 NOTE — Telephone Encounter (Signed)
 Left another vm regarding missed appointment-Ashley Stephens

## 2024-02-19 ENCOUNTER — Other Ambulatory Visit: Payer: Self-pay | Admitting: Physician Assistant

## 2024-02-19 DIAGNOSIS — E1165 Type 2 diabetes mellitus with hyperglycemia: Secondary | ICD-10-CM

## 2024-02-20 ENCOUNTER — Telehealth: Payer: Self-pay | Admitting: Physician Assistant

## 2024-02-20 NOTE — Telephone Encounter (Signed)
 Left 3rd vm to schedule missed appointment. Mailed letter-Toni

## 2024-03-05 ENCOUNTER — Encounter: Payer: Self-pay | Admitting: Physician Assistant

## 2024-03-05 ENCOUNTER — Ambulatory Visit (INDEPENDENT_AMBULATORY_CARE_PROVIDER_SITE_OTHER): Admitting: Physician Assistant

## 2024-03-05 VITALS — BP 124/88 | HR 93 | Temp 98.4°F | Resp 16 | Ht 67.0 in | Wt 247.2 lb

## 2024-03-05 DIAGNOSIS — R5383 Other fatigue: Secondary | ICD-10-CM

## 2024-03-05 DIAGNOSIS — Z1231 Encounter for screening mammogram for malignant neoplasm of breast: Secondary | ICD-10-CM

## 2024-03-05 DIAGNOSIS — E669 Obesity, unspecified: Secondary | ICD-10-CM

## 2024-03-05 DIAGNOSIS — E1165 Type 2 diabetes mellitus with hyperglycemia: Secondary | ICD-10-CM | POA: Diagnosis not present

## 2024-03-05 DIAGNOSIS — E538 Deficiency of other specified B group vitamins: Secondary | ICD-10-CM

## 2024-03-05 DIAGNOSIS — E559 Vitamin D deficiency, unspecified: Secondary | ICD-10-CM

## 2024-03-05 DIAGNOSIS — E039 Hypothyroidism, unspecified: Secondary | ICD-10-CM | POA: Diagnosis not present

## 2024-03-05 DIAGNOSIS — E782 Mixed hyperlipidemia: Secondary | ICD-10-CM

## 2024-03-05 LAB — POCT GLYCOSYLATED HEMOGLOBIN (HGB A1C): Hemoglobin A1C: 5.6 % (ref 4.0–5.6)

## 2024-03-05 MED ORDER — TIRZEPATIDE 2.5 MG/0.5ML ~~LOC~~ SOAJ: 2.5000 mg | SUBCUTANEOUS | 0 refills | Status: DC

## 2024-03-05 MED ORDER — LEVOTHYROXINE SODIUM 25 MCG PO TABS: 25.0000 ug | ORAL_TABLET | Freq: Every day | ORAL | 1 refills | Status: DC

## 2024-03-05 NOTE — Progress Notes (Signed)
 [facility] [address] [place], [place] [address]  Internal MEDICINE  Office Visit Note  Patient Name: Ashley Stephens  [id number]  [id number]  Date of Service: 03/05/2024  Chief Complaint  Patient presents with   Follow-up   Diabetes   Depression   Gastroesophageal Reflux   Medication Refill   Quality Metric Gaps    Eye Exam, Colonoscopy and Mammogram    HPI Pt is here for routine follow up, unfortuantely she missed a few visits/physical -had not had labs done and will reorder now -still needs mammogram -BP at home stable -took last ozempic  on Sunday, hit plateau with this and has gained weight since last visit -would like to try mounjaro instead and will send -had GB surgery -did have UTI a few weeks ago, resolved  Current Medication: Outpatient Encounter Medications as of 03/05/2024  Medication Sig Note   acetaminophen  (TYLENOL ) 650 MG CR tablet Take 1,300 mg by mouth every 8 (eight) hours as needed for pain.    Adapalene-Benzoyl Peroxide 0.3-2.5 % GEL Apply 1 Application topically daily as needed (acne).    ALPRAZolam  (XANAX ) 0.25 MG tablet Take 1 tablet (0.25 mg total) by mouth 2 (two) times daily as needed. for anxiety    Ascorbic Acid (VITAMIN C) 1000 MG tablet Take 1,000 mg by mouth daily.    Cholecalciferol (VITAMIN D3 ULTRA STRENGTH) 125 MCG (5000 UT) capsule Take 5,000 Units by mouth daily.    clindamycin (CLINDAGEL) 1 % gel Apply 1 Application topically daily as needed (acne).    Cyanocobalamin  (B-12) 2500 MCG TABS Take 2,500 mcg by mouth daily.    desvenlafaxine  (PRISTIQ ) 100 MG 24 hr tablet Take 1 tablet (100 mg total) by mouth every morning.    desvenlafaxine  (PRISTIQ ) 50 MG 24 hr tablet Take 1 tablet (50 mg total) by mouth at bedtime.    ibuprofen  (ADVIL ) 200 MG tablet Take 400 mg by mouth every 6 (six) hours as needed for moderate pain (pain score 4-6).    Multiple Vitamin (MULTIVITAMIN WITH MINERALS) TABS tablet Take 1 tablet by mouth  daily.    omeprazole  (PRILOSEC) 40 MG capsule Take 1 capsule (40 mg total) by mouth daily. (Patient taking differently: Take 40 mg by mouth daily as needed (acid reflux).)    Oxcarbazepine  (TRILEPTAL ) 300 MG tablet Take 1 tablet (300 mg total) by mouth 2 (two) times daily.    propranolol  (INDERAL ) 20 MG tablet Take 1 tablet (20 mg total) by mouth 3 (three) times daily as needed. (Patient taking differently: Take 20 mg by mouth 3 (three) times daily.)    rosuvastatin  (CRESTOR ) 5 MG tablet Take 1 tablet by mouth once daily (Patient taking differently: Take 5 mg by mouth at bedtime.)    spironolactone (ALDACTONE) 100 MG tablet Take 100 mg by mouth 2 (two) times daily. 10/03/2023: Pt ran out, needs to see the dr before continuing    tirzepatide (MOUNJARO) 2.5 MG/0.5ML Pen Inject 2.5 mg into the skin once a week.    Zinc 50 MG TABS Take 50 mg by mouth daily.    [DISCONTINUED] levothyroxine  (SYNTHROID ) 25 MCG tablet TAKE 1 TABLET BY MOUTH ONCE DAILY BEFORE BREAKFAST    [DISCONTINUED] OZEMPIC , 2 MG/DOSE, 8 MG/3ML SOPN INJECT 2MG   SUBCUTANEOUSLY ONCE A WEEK    levothyroxine  (SYNTHROID ) 25 MCG tablet Take 1 tablet (25 mcg total) by mouth daily before breakfast.    No facility-administered encounter medications on file as of 03/05/2024.    Surgical History: Past Surgical History:  Procedure Laterality Date   ABDOMINAL HYSTERECTOMY  2013   BREAST BIOPSY  1995   COLONOSCOPY WITH PROPOFOL  N/A 10/28/2020   Procedure: COLONOSCOPY WITH PROPOFOL ;  Surgeon: Therisa Bi, MD;  Location: [facility] ENDOSCOPY;  Service: Gastroenterology;  Laterality: N/A;   GASTRIC BYPASS  2012   TUBAL LIGATION  1996   tummy tuck  05/27/2020    Medical History: Past Medical History:  Diagnosis Date   Anxiety    B12 deficiency    DDD (degenerative disc disease), lumbar    Depression    DM (diabetes mellitus), type 2 (HCC)    Gastric ulcer    GERD (gastroesophageal reflux disease)    History of Roux-en-Y gastric bypass     Hypothyroidism    Obesity    Osteoarthritis    Sleep apnea    h/o OSA but never used cpap   Symptomatic cholelithiasis    Thyroid  disorder     Family History: Family History  Problem Relation Age of Onset   Breast cancer Maternal Grandmother    Diabetes Maternal Grandmother    Heart disease Maternal Grandmother    Hypertension Maternal Grandmother    Heart disease Brother    Hypertension Brother    Hyperlipidemia Brother    Diabetes Maternal Aunt    Heart disease Maternal Aunt    Hypertension Maternal Aunt    Hyperlipidemia Maternal Aunt    Diabetes Maternal Aunt    Heart disease Maternal Aunt    Hypertension Maternal Aunt    Hyperlipidemia Maternal Aunt    Diabetes Maternal Aunt    Heart disease Maternal Aunt    Hypertension Maternal Aunt    Hyperlipidemia Maternal Aunt     Social History   Socioeconomic History   Marital status: Single    Spouse name: Not on file   Number of children: 1   Years of education: Not on file   Highest education level: Not on file  Occupational History   Occupation: Education officer, community  Tobacco Use   Smoking status: Never   Smokeless tobacco: Never  Vaping Use   Vaping status: Never Used  Substance and Sexual Activity   Alcohol use: No    Alcohol/week: 0.0 standard drinks of alcohol   Drug use: No   Sexual activity: Yes    Birth control/protection: Surgical  Other Topics Concern   Not on file  Social History Narrative   Not on file   Social Drivers of Health   Financial Resource Strain: Low Risk  (03/13/2021)   Received from [facility]   Overall Financial Resource Strain (CARDIA)    Difficulty of Paying Living Expenses: Not hard at all  Food Insecurity: No Food Insecurity (03/13/2021)   Received from [facility]   Hunger Vital Sign    Within the past 12 months, you worried that your food would run out before you got the money to buy more.: Never true    Within the past 12 months, the food you bought just didn't  last and you didn't have money to get more.: Never true  Transportation Needs: No Transportation Needs (03/13/2021)   Received from [facility]   PRAPARE - Transportation    Lack of Transportation (Medical): No    Lack of Transportation (Non-Medical): No  Physical Activity: Not on file  Stress: Not on file  Social Connections: Not on file  Intimate Partner Violence: Not on file      Review of Systems  Constitutional:  Negative for chills  and unexpected weight change.  HENT:  Negative for congestion, postnasal drip, rhinorrhea, sneezing and sore throat.   Eyes:  Negative for redness.  Respiratory: Negative.  Negative for cough, chest tightness, shortness of breath and wheezing.   Cardiovascular: Negative.  Negative for chest pain and palpitations.  Gastrointestinal:  Negative for abdominal pain, constipation, diarrhea, nausea and vomiting.  Genitourinary:  Negative for dysuria and frequency.  Musculoskeletal:  Negative for arthralgias, back pain, joint swelling and neck pain.  Skin:  Negative for rash.  Neurological: Negative.  Negative for tremors and numbness.  Hematological:  Negative for adenopathy. Does not bruise/bleed easily.  Psychiatric/Behavioral:  Negative for behavioral problems (Depression), sleep disturbance and suicidal ideas. The patient is not nervous/anxious.     Vital Signs: BP 124/88 Comment: 125/90  Pulse 93   Temp 98.4 F (36.9 C)   Resp 16   Ht 5' 7 (1.702 m)   Wt 247 lb 3.2 oz (112.1 kg)   SpO2 98%   BMI 38.72 kg/m    Physical Exam Vitals and nursing note reviewed.  Constitutional:      General: She is not in acute distress.    Appearance: Normal appearance. She is obese. She is not ill-appearing.  HENT:     Head: Normocephalic and atraumatic.  Eyes:     Pupils: Pupils are equal, round, and reactive to light.  Cardiovascular:     Rate and Rhythm: Normal rate and regular rhythm.  Pulmonary:     Effort: Pulmonary effort is normal. No  respiratory distress.  Skin:    General: Skin is warm and dry.  Neurological:     Mental Status: She is alert and oriented to person, place, and time.  Psychiatric:        Mood and Affect: Mood normal.        Behavior: Behavior normal.        Assessment/Plan: 1. Type 2 diabetes mellitus with hyperglycemia, without long-term current use of insulin (HCC) (Primary) - POCT HgB A1C if 5.6 which is improved and now in normal range. Pt out of ozempic  and would like to try switching to mounjaro - Urine Microalbumin w/creat. ratio - tirzepatide (MOUNJARO) 2.5 MG/0.5ML Pen; Inject 2.5 mg into the skin once a week.  Dispense: 2 mL; Refill: 0  2. Acquired hypothyroidism Will check labs and adjust synthroid  as needed - levothyroxine  (SYNTHROID ) 25 MCG tablet; Take 1 tablet (25 mcg total) by mouth daily before breakfast.  Dispense: 90 tablet; Refill: 1 - TSH + free T4  3. Vitamin D  deficiency - VITAMIN D  25 Hydroxy (Vit-D Deficiency, Fractures)  4. B12 deficiency - B12 and Folate Panel  5. Mixed hyperlipidemia Continue crestor  and will update labs - Lipid Panel With LDL/HDL Ratio  6. Obesity (BMI 30-39.9) Contine to work on diet and exercise. May try mounjaro to help BG and wt loss benefits  7. Visit for screening mammogram - MM 3D SCREENING MAMMOGRAM BILATERAL BREAST; Future  8. Other fatigue - Comprehensive metabolic panel with GFR - CBC w/Diff/Platelet - TSH + free T4 - Lipid Panel With LDL/HDL Ratio - B12 and Folate Panel - VITAMIN D  25 Hydroxy (Vit-D Deficiency, Fractures) - Fe+TIBC+Fer   General Counseling: Taler verbalizes understanding of the findings of todays visit and agrees with plan of treatment. I have discussed any further diagnostic evaluation that may be needed or ordered today. We also reviewed her medications today. she has been encouraged to call the office with any questions or concerns that should  arise related to todays visit.    Orders Placed This  Encounter  Procedures   MM 3D SCREENING MAMMOGRAM BILATERAL BREAST   Urine Microalbumin w/creat. ratio   Comprehensive metabolic panel with GFR   CBC w/Diff/Platelet   TSH + free T4   Lipid Panel With LDL/HDL Ratio   B12 and Folate Panel   VITAMIN D  25 Hydroxy (Vit-D Deficiency, Fractures)   Fe+TIBC+Fer   POCT HgB A1C    Meds ordered this encounter  Medications   tirzepatide (MOUNJARO) 2.5 MG/0.5ML Pen    Sig: Inject 2.5 mg into the skin once a week.    Dispense:  2 mL    Refill:  0   levothyroxine  (SYNTHROID ) 25 MCG tablet    Sig: Take 1 tablet (25 mcg total) by mouth daily before breakfast.    Dispense:  90 tablet    Refill:  1    This patient was seen by Tinnie Pro, PA-C in collaboration with Dr. Sigrid Bathe as a part of collaborative care agreement.   Total time spent:30 Minutes Time spent includes review of chart, medications, test results, and follow up plan with the patient.      Dr Fozia M Khan Internal medicine

## 2024-03-07 LAB — MICROALBUMIN / CREATININE URINE RATIO
Creatinine, Urine: 188.9 mg/dL
Microalb/Creat Ratio: 2 mg/g{creat} (ref 0–29)
Microalbumin, Urine: 4.7 ug/mL

## 2024-03-17 ENCOUNTER — Other Ambulatory Visit: Payer: Self-pay | Admitting: Internal Medicine

## 2024-03-17 DIAGNOSIS — E782 Mixed hyperlipidemia: Secondary | ICD-10-CM

## 2024-03-23 ENCOUNTER — Ambulatory Visit (HOSPITAL_COMMUNITY)

## 2024-03-27 ENCOUNTER — Ambulatory Visit (HOSPITAL_COMMUNITY)

## 2024-04-01 ENCOUNTER — Other Ambulatory Visit: Payer: Self-pay

## 2024-04-01 ENCOUNTER — Telehealth: Payer: Self-pay | Admitting: Physician Assistant

## 2024-04-01 DIAGNOSIS — E1165 Type 2 diabetes mellitus with hyperglycemia: Secondary | ICD-10-CM

## 2024-04-01 MED ORDER — TIRZEPATIDE 2.5 MG/0.5ML ~~LOC~~ SOAJ
2.5000 mg | SUBCUTANEOUS | 0 refills | Status: DC
Start: 2024-04-01 — End: 2024-04-07

## 2024-04-01 NOTE — Telephone Encounter (Signed)
 Already sent 2.5 mg please send next increased dose if you like to increase

## 2024-04-03 ENCOUNTER — Emergency Department (HOSPITAL_COMMUNITY)
Admission: EM | Admit: 2024-04-03 | Discharge: 2024-04-03 | Discharge status: Against Medical Advice | Attending: Emergency Medicine | Admitting: Emergency Medicine

## 2024-04-03 ENCOUNTER — Emergency Department (HOSPITAL_COMMUNITY)

## 2024-04-03 ENCOUNTER — Other Ambulatory Visit: Payer: Self-pay

## 2024-04-03 ENCOUNTER — Encounter (HOSPITAL_COMMUNITY): Payer: Self-pay

## 2024-04-03 DIAGNOSIS — Z5321 Procedure and treatment not carried out due to patient leaving prior to being seen by health care provider: Secondary | ICD-10-CM | POA: Diagnosis not present

## 2024-04-03 DIAGNOSIS — R0789 Other chest pain: Secondary | ICD-10-CM | POA: Insufficient documentation

## 2024-04-03 DIAGNOSIS — R531 Weakness: Secondary | ICD-10-CM | POA: Insufficient documentation

## 2024-04-03 DIAGNOSIS — R142 Eructation: Secondary | ICD-10-CM | POA: Diagnosis not present

## 2024-04-03 DIAGNOSIS — R5383 Other fatigue: Secondary | ICD-10-CM | POA: Insufficient documentation

## 2024-04-03 LAB — BASIC METABOLIC PANEL WITH GFR
Anion gap: 11 (ref 5–15)
BUN: 12 mg/dL (ref 6–20)
CO2: 20 mmol/L — ABNORMAL LOW (ref 22–32)
Calcium: 9.1 mg/dL (ref 8.9–10.3)
Chloride: 107 mmol/L (ref 98–111)
Creatinine, Ser: 0.95 mg/dL (ref 0.44–1.00)
GFR, Estimated: >60 mL/min (ref >60)
Glucose, Bld: 123 mg/dL — ABNORMAL HIGH (ref 70–99)
Potassium: 4.2 mmol/L (ref 3.5–5.1)
Sodium: 138 mmol/L (ref 135–145)

## 2024-04-03 LAB — CBC
HCT: 41.4 % (ref 36.0–46.0)
Hemoglobin: 13.9 g/dL (ref 12.0–15.0)
MCH: 29.6 pg (ref 26.0–34.0)
MCHC: 33.6 g/dL (ref 30.0–36.0)
MCV: 88.1 fL (ref 80.0–100.0)
Platelets: 308 K/uL (ref 150–400)
RBC: 4.7 MIL/uL (ref 3.87–5.11)
RDW: 13.8 % (ref 11.5–15.5)
WBC: 5.7 K/uL (ref 4.0–10.5)
nRBC: 0 % (ref 0.0–0.2)

## 2024-04-03 LAB — TROPONIN I (HIGH SENSITIVITY): Troponin I (High Sensitivity): 3 ng/L (ref <18)

## 2024-04-03 NOTE — ED Triage Notes (Signed)
 Pt from home, c/o weakness and lethargy. 2pm started w substernal chest tightness, left arm feels tight and heavy. C/O vomiting and acid reflux. Hx of acid reflux. Axo4. Denies SHOB.

## 2024-04-03 NOTE — ED Notes (Signed)
 Extra tubes sent to lab including gold top and blue top.

## 2024-04-03 NOTE — ED Notes (Signed)
 Pt leaving against medical advice after triage and lab work. Pt states she is not willing to wait for hours to get a room, pt oriented that all her labs, x-ray and pre assessment is been completed prior to her getting a room, pt still consider to leave AMA. Pt oriented she will be responsible for any complications she may have leaving home without complete care. Pt verbalized understanding. Pt is AO x 4 and able to make her own decisions.

## 2024-04-03 NOTE — ED Provider Triage Note (Cosign Needed)
 Emergency Medicine Provider Triage Evaluation Note  Ashley Stephens , a 48 y.o. female  was evaluated in triage.  Pt complains of chest pressure/tightness, weakness/fatigue.  Symptoms began this morning when patient woke up, no pertinent cardiac history, patient endorses nausea/several episode of vomiting today, was given Zofran  and has not experienced any vomiting since.  Frequent belching, typically takes Prilosec which usually helps but is not helping her today.  Denies shortness of breath.  Review of Systems  Positive: As above Negative: As above  Physical Exam  BP 113/74 (BP Location: Left Arm)   Pulse 73   Temp 97.9 F (36.6 C) (Oral)   Resp 16   Ht 5' 7 (1.702 m)   Wt 113.4 kg   SpO2 95%   BMI 39.16 kg/m  Gen:   Awake, no distress   Resp:  Normal effort  MSK:   Moves extremities without difficulty  Other:  Frequent belching  Medical Decision Making  Medically screening exam initiated at 8:34 PM.  Appropriate orders placed.  Ashley Stephens was informed that the remainder of the evaluation will be completed by another provider, this initial triage assessment does not replace that evaluation, and the importance of remaining in the ED until their evaluation is complete.     Ashley Stephens SAILOR, [place] 04/03/24 2035

## 2024-04-04 ENCOUNTER — Ambulatory Visit (HOSPITAL_COMMUNITY)

## 2024-04-07 ENCOUNTER — Other Ambulatory Visit: Payer: Self-pay | Admitting: Physician Assistant

## 2024-04-07 DIAGNOSIS — E1165 Type 2 diabetes mellitus with hyperglycemia: Secondary | ICD-10-CM

## 2024-04-07 MED ORDER — TIRZEPATIDE 5 MG/0.5ML ~~LOC~~ SOAJ: 5.0000 mg | SUBCUTANEOUS | 2 refills | Status: DC

## 2024-04-07 NOTE — Telephone Encounter (Signed)
 Lmom that we sent Mounjaro  5 mg

## 2024-04-14 ENCOUNTER — Telehealth: Payer: Self-pay | Admitting: Physician Assistant

## 2024-04-14 NOTE — Telephone Encounter (Signed)
 Left vm and sent mychart message to confirm 04/20/24 appointment-Toni

## 2024-04-20 ENCOUNTER — Encounter: Admitting: Physician Assistant

## 2024-05-01 ENCOUNTER — Telehealth: Payer: Self-pay | Admitting: Physician Assistant

## 2024-05-01 NOTE — Telephone Encounter (Signed)
 Left vm and sent mychart message to confirm 05/07/24 appointment-Toni

## 2024-05-08 ENCOUNTER — Encounter: Admitting: Physician Assistant

## 2024-05-20 ENCOUNTER — Telehealth: Payer: Self-pay | Admitting: Physician Assistant

## 2024-05-20 NOTE — Telephone Encounter (Signed)
 Lvm & sent message to reschedule 05/08/2024 missed appointment-Toni

## 2024-07-13 ENCOUNTER — Other Ambulatory Visit: Payer: Self-pay | Admitting: Physician Assistant

## 2024-07-13 ENCOUNTER — Telehealth: Payer: Self-pay | Admitting: Physician Assistant

## 2024-07-13 NOTE — Telephone Encounter (Signed)
 03/05/24 lab orders faxed to [facility]; [phone]

## 2024-07-14 LAB — CBC WITH DIFFERENTIAL/PLATELET
Basophils Absolute: 0 x10E3/uL (ref 0.0–0.2)
Basos: 0 %
EOS (ABSOLUTE): 0.1 x10E3/uL (ref 0.0–0.4)
Eos: 1 %
Hematocrit: 45.1 % (ref 34.0–46.6)
Hemoglobin: 14.9 g/dL (ref 11.1–15.9)
Immature Grans (Abs): 0 x10E3/uL (ref 0.0–0.1)
Immature Granulocytes: 0 %
Lymphocytes Absolute: 2.3 x10E3/uL (ref 0.7–3.1)
Lymphs: 38 %
MCH: 30.3 pg (ref 26.6–33.0)
MCHC: 33 g/dL (ref 31.5–35.7)
MCV: 92 fL (ref 79–97)
Monocytes Absolute: 0.4 x10E3/uL (ref 0.1–0.9)
Monocytes: 7 %
Neutrophils Absolute: 3.2 x10E3/uL (ref 1.4–7.0)
Neutrophils: 54 %
Platelets: 286 x10E3/uL (ref 150–450)
RBC: 4.92 x10E6/uL (ref 3.77–5.28)
RDW: 12.5 % (ref 11.7–15.4)
WBC: 6 x10E3/uL (ref 3.4–10.8)

## 2024-07-14 LAB — IRON,TIBC AND FERRITIN PANEL
Ferritin: 58 ng/mL (ref 15–150)
Iron Saturation: 43 % (ref 15–55)
Iron: 162 ug/dL — ABNORMAL HIGH (ref 27–159)
Total Iron Binding Capacity: 376 ug/dL (ref 250–450)
UIBC: 214 ug/dL (ref 131–425)

## 2024-07-14 LAB — COMPREHENSIVE METABOLIC PANEL WITH GFR
ALT: 20 IU/L (ref 0–32)
AST: 21 IU/L (ref 0–40)
Albumin: 4.7 g/dL (ref 3.9–4.9)
Alkaline Phosphatase: 94 IU/L (ref 41–116)
BUN/Creatinine Ratio: 11 (ref 9–23)
BUN: 11 mg/dL (ref 6–24)
Bilirubin Total: 0.3 mg/dL (ref 0.0–1.2)
CO2: 25 mmol/L (ref 20–29)
Calcium: 10 mg/dL (ref 8.7–10.2)
Chloride: 102 mmol/L (ref 96–106)
Creatinine, Ser: 1.02 mg/dL — ABNORMAL HIGH (ref 0.57–1.00)
Globulin, Total: 2.4 g/dL (ref 1.5–4.5)
Glucose: 92 mg/dL (ref 70–99)
Potassium: 4.5 mmol/L (ref 3.5–5.2)
Sodium: 141 mmol/L (ref 134–144)
Total Protein: 7.1 g/dL (ref 6.0–8.5)
eGFR: 67 mL/min/1.73 (ref >59)

## 2024-07-14 LAB — B12 AND FOLATE PANEL
Folate: >20 ng/mL (ref >3.0)
Vitamin B-12: >2000 pg/mL — ABNORMAL HIGH (ref 232–1245)

## 2024-07-14 LAB — TSH+FREE T4
Free T4: 0.85 ng/dL (ref 0.82–1.77)
TSH: 2.06 u[IU]/mL (ref 0.450–4.500)

## 2024-07-14 LAB — LIPID PANEL WITH LDL/HDL RATIO
Cholesterol, Total: 159 mg/dL (ref 100–199)
HDL: 48 mg/dL (ref >39)
LDL Chol Calc (NIH): 84 mg/dL (ref 0–99)
LDL/HDL Ratio: 1.8 ratio (ref 0.0–3.2)
Triglycerides: 154 mg/dL — ABNORMAL HIGH (ref 0–149)
VLDL Cholesterol Cal: 27 mg/dL (ref 5–40)

## 2024-07-14 LAB — VITAMIN D 25 HYDROXY (VIT D DEFICIENCY, FRACTURES): Vit D, 25-Hydroxy: 72.3 ng/mL (ref 30.0–100.0)

## 2024-07-15 ENCOUNTER — Ambulatory Visit: Payer: Self-pay | Admitting: Physician Assistant

## 2024-07-15 ENCOUNTER — Other Ambulatory Visit: Payer: Self-pay

## 2024-07-15 MED ORDER — LEVOTHYROXINE SODIUM 50 MCG PO TABS
50.0000 ug | ORAL_TABLET | Freq: Every day | ORAL | 3 refills | Status: AC
Start: 2024-07-15 — End: ?

## 2024-07-15 NOTE — Telephone Encounter (Signed)
-----   Message from Tinnie MARLA Pro sent at 07/15/2024 12:57 PM EST ----- Please let her know that her iron is a little elevated and should decrease supplement. TG also a little elevated and continue to work on decreasing fried foods and continue crestor . Thyroid   borderline low and would like to increase synthroid  to 50mcg daily. ----- Message ----- From: Interface, [facility] Lab Results In Sent: 07/14/2024   8:16 AM EST To: Tinnie MARLA Pro, PA-C

## 2024-07-15 NOTE — Telephone Encounter (Signed)
 Spoke with patient regarding labs, sent of Synthroid  per Lauren.

## 2024-07-23 ENCOUNTER — Ambulatory Visit (INDEPENDENT_AMBULATORY_CARE_PROVIDER_SITE_OTHER): Admitting: Physician Assistant

## 2024-07-23 ENCOUNTER — Encounter: Payer: Self-pay | Admitting: Physician Assistant

## 2024-07-23 VITALS — BP 120/73 | HR 76 | Temp 98.0°F | Resp 16 | Ht 67.0 in | Wt 243.0 lb

## 2024-07-23 DIAGNOSIS — Z0100 Encounter for examination of eyes and vision without abnormal findings: Secondary | ICD-10-CM | POA: Diagnosis not present

## 2024-07-23 DIAGNOSIS — E1165 Type 2 diabetes mellitus with hyperglycemia: Secondary | ICD-10-CM | POA: Diagnosis not present

## 2024-07-23 DIAGNOSIS — E039 Hypothyroidism, unspecified: Secondary | ICD-10-CM

## 2024-07-23 DIAGNOSIS — E782 Mixed hyperlipidemia: Secondary | ICD-10-CM

## 2024-07-23 DIAGNOSIS — Z0001 Encounter for general adult medical examination with abnormal findings: Secondary | ICD-10-CM

## 2024-07-23 DIAGNOSIS — R300 Dysuria: Secondary | ICD-10-CM

## 2024-07-23 DIAGNOSIS — E119 Type 2 diabetes mellitus without complications: Secondary | ICD-10-CM

## 2024-07-23 DIAGNOSIS — Z1212 Encounter for screening for malignant neoplasm of rectum: Secondary | ICD-10-CM

## 2024-07-23 DIAGNOSIS — K219 Gastro-esophageal reflux disease without esophagitis: Secondary | ICD-10-CM

## 2024-07-23 DIAGNOSIS — Z1211 Encounter for screening for malignant neoplasm of colon: Secondary | ICD-10-CM

## 2024-07-23 MED ORDER — OMEPRAZOLE 40 MG PO CPDR: 40.0000 mg | DELAYED_RELEASE_CAPSULE | Freq: Every day | ORAL | 2 refills | Status: AC | PRN

## 2024-07-23 MED ORDER — ROSUVASTATIN CALCIUM 10 MG PO TABS: 10.0000 mg | ORAL_TABLET | Freq: Every day | ORAL | 3 refills | Status: AC

## 2024-07-23 MED ORDER — TIRZEPATIDE 7.5 MG/0.5ML ~~LOC~~ SOAJ: 7.5000 mg | SUBCUTANEOUS | 2 refills | Status: AC

## 2024-07-23 NOTE — Addendum Note (Signed)
 Addended by: ALMER BI on: 07/23/2024 01:58 PM   Modules accepted: Orders

## 2024-07-23 NOTE — Progress Notes (Signed)
 [facility] [address] [place], [place] [address]  Internal MEDICINE  Office Visit Note  Patient Name: Ashley Stephens  [id number]  [id number]  Date of Service: 07/23/2024  Chief Complaint  Patient presents with   Annual Exam    Review labs   Diabetes   Depression   Gastroesophageal Reflux     HPI Pt is here for routine health maintenance examination -would like to increase mounjaro , tolerating well -Labs reviewed: vit D normal-high and will reduce supplement slightly, TG a little elevated and would like to raise crestor . Iron borderline high -due for colonoscopy -needs eye exam -needs mammogram--prev ordered and will schedule -needs pap--states hysterectomy without taking cervix and never did repeat after last UNS -foot exam done  Current Medication: Outpatient Encounter Medications as of 07/23/2024  Medication Sig Note   acetaminophen  (TYLENOL ) 650 MG CR tablet Take 1,300 mg by mouth every 8 (eight) hours as needed for pain.    Adapalene-Benzoyl Peroxide 0.3-2.5 % GEL Apply 1 Application topically daily as needed (acne).    ALPRAZolam  (XANAX ) 0.25 MG tablet Take 1 tablet (0.25 mg total) by mouth 2 (two) times daily as needed. for anxiety    Ascorbic Acid (VITAMIN C) 1000 MG tablet Take 1,000 mg by mouth daily.    Cholecalciferol (VITAMIN D3 ULTRA STRENGTH) 125 MCG (5000 UT) capsule Take 5,000 Units by mouth daily.    clindamycin (CLINDAGEL) 1 % gel Apply 1 Application topically daily as needed (acne).    Cyanocobalamin  (B-12) 2500 MCG TABS Take 2,500 mcg by mouth daily.    desvenlafaxine  (PRISTIQ ) 100 MG 24 hr tablet Take 1 tablet (100 mg total) by mouth every morning.    desvenlafaxine  (PRISTIQ ) 50 MG 24 hr tablet Take 1 tablet (50 mg total) by mouth at bedtime.    ibuprofen  (ADVIL ) 200 MG tablet Take 400 mg by mouth every 6 (six) hours as needed for moderate pain (pain score 4-6).    levothyroxine  (SYNTHROID ) 50 MCG tablet Take 1 tablet (50 mcg  total) by mouth daily before breakfast.    Multiple Vitamin (MULTIVITAMIN WITH MINERALS) TABS tablet Take 1 tablet by mouth daily.    Oxcarbazepine  (TRILEPTAL ) 300 MG tablet Take 1 tablet (300 mg total) by mouth 2 (two) times daily.    propranolol  (INDERAL ) 20 MG tablet Take 1 tablet (20 mg total) by mouth 3 (three) times daily as needed. (Patient taking differently: Take 20 mg by mouth 3 (three) times daily.)    rosuvastatin  (CRESTOR ) 10 MG tablet Take 1 tablet (10 mg total) by mouth daily.    spironolactone (ALDACTONE) 100 MG tablet Take 100 mg by mouth 2 (two) times daily. 10/03/2023: Pt ran out, needs to see the dr before continuing    tirzepatide  (MOUNJARO ) 7.5 MG/0.5ML Pen Inject 7.5 mg into the skin once a week.    Zinc 50 MG TABS Take 50 mg by mouth daily.    [DISCONTINUED] omeprazole  (PRILOSEC) 40 MG capsule Take 1 capsule (40 mg total) by mouth daily. (Patient taking differently: Take 40 mg by mouth daily as needed (acid reflux).)    [DISCONTINUED] rosuvastatin  (CRESTOR ) 5 MG tablet Take 1 tablet by mouth once daily    [DISCONTINUED] tirzepatide  (MOUNJARO ) 5 MG/0.5ML Pen Inject 5 mg into the skin once a week.    omeprazole  (PRILOSEC) 40 MG capsule Take 1 capsule (40 mg total) by mouth daily as needed (acid reflux).    No facility-administered encounter medications on file as of 07/23/2024.    Surgical  History: Past Surgical History:  Procedure Laterality Date   ABDOMINAL HYSTERECTOMY  2013   BREAST BIOPSY  1995   COLONOSCOPY WITH PROPOFOL  N/A 10/28/2020   Procedure: COLONOSCOPY WITH PROPOFOL ;  Surgeon: Therisa Bi, MD;  Location: [facility] ENDOSCOPY;  Service: Gastroenterology;  Laterality: N/A;   GASTRIC BYPASS  2012   TUBAL LIGATION  1996   tummy tuck  05/27/2020    Medical History: Past Medical History:  Diagnosis Date   Anxiety    B12 deficiency    DDD (degenerative disc disease), lumbar    Depression    DM (diabetes mellitus), type 2 (HCC)    Gastric ulcer    GERD  (gastroesophageal reflux disease)    History of Roux-en-Y gastric bypass    Hypothyroidism    Obesity    Osteoarthritis    Sleep apnea    h/o OSA but never used cpap   Symptomatic cholelithiasis    Thyroid  disorder     Family History: Family History  Problem Relation Age of Onset   Breast cancer Maternal Grandmother    Diabetes Maternal Grandmother    Heart disease Maternal Grandmother    Hypertension Maternal Grandmother    Heart disease Brother    Hypertension Brother    Hyperlipidemia Brother    Diabetes Maternal Aunt    Heart disease Maternal Aunt    Hypertension Maternal Aunt    Hyperlipidemia Maternal Aunt    Diabetes Maternal Aunt    Heart disease Maternal Aunt    Hypertension Maternal Aunt    Hyperlipidemia Maternal Aunt    Diabetes Maternal Aunt    Heart disease Maternal Aunt    Hypertension Maternal Aunt    Hyperlipidemia Maternal Aunt       Review of Systems  Constitutional:  Negative for chills and unexpected weight change.  HENT:  Negative for congestion, postnasal drip, rhinorrhea, sneezing and sore throat.   Eyes:  Negative for redness.  Respiratory: Negative.  Negative for cough, chest tightness, shortness of breath and wheezing.   Cardiovascular: Negative.  Negative for chest pain and palpitations.  Gastrointestinal:  Negative for abdominal pain, constipation, diarrhea, nausea and vomiting.  Genitourinary:  Negative for dysuria and frequency.  Musculoskeletal:  Negative for arthralgias, back pain, joint swelling and neck pain.  Skin:  Negative for rash.  Neurological: Negative.  Negative for tremors and numbness.  Hematological:  Negative for adenopathy. Does not bruise/bleed easily.  Psychiatric/Behavioral:  Negative for behavioral problems (Depression), sleep disturbance and suicidal ideas. The patient is not nervous/anxious.      Vital Signs: BP 120/73   Pulse 76   Temp 98 F (36.7 C)   Resp 16   Ht 5' 7 (1.702 m)   Wt 243 lb (110.2 kg)    SpO2 96%   BMI 38.06 kg/m    Physical Exam Vitals and nursing note reviewed.  Constitutional:      General: She is not in acute distress.    Appearance: Normal appearance. She is obese. She is not ill-appearing.  HENT:     Head: Normocephalic and atraumatic.     Mouth/Throat:     Pharynx: No posterior oropharyngeal erythema.  Eyes:     Pupils: Pupils are equal, round, and reactive to light.  Cardiovascular:     Rate and Rhythm: Normal rate and regular rhythm.     Pulses:          Dorsalis pedis pulses are 3+ on the right side and 3+ on the left side.  Posterior tibial pulses are 3+ on the right side and 3+ on the left side.  Pulmonary:     Effort: Pulmonary effort is normal. No respiratory distress.  Chest:     Chest wall: No tenderness.  Breasts:    Right: No mass.     Left: No mass.  Abdominal:     General: Bowel sounds are normal.     Tenderness: There is no abdominal tenderness.  Musculoskeletal:        General: Normal range of motion.     Right foot: Normal range of motion.     Left foot: Normal range of motion.  Feet:     Right foot:     Protective Sensation: 2 sites tested.  2 sites sensed.     Skin integrity: Dry skin present.     Toenail Condition: Right toenails are normal.     Left foot:     Protective Sensation: 2 sites tested.  2 sites sensed.     Skin integrity: Dry skin present.     Toenail Condition: Left toenails are normal.  Skin:    General: Skin is warm and dry.  Neurological:     Mental Status: She is alert and oriented to person, place, and time.  Psychiatric:        Mood and Affect: Mood normal.        Behavior: Behavior normal.      LABS: Recent Results (from the past 2160 hours)  Iron, TIBC and Ferritin Panel     Status: Abnormal   Collection Time: 07/13/24 11:32 AM  Result Value Ref Range   Total Iron Binding Capacity 376 250 - 450 ug/dL   UIBC 785 868 - 574 ug/dL   Iron 837 (H) 27 - 840 ug/dL   Iron Saturation 43 15 -  55 %   Ferritin 58 15 - 150 ng/mL  TSH + free T4     Status: None   Collection Time: 07/13/24 11:32 AM  Result Value Ref Range   TSH 2.060 0.450 - 4.500 uIU/mL   Free T4 0.85 0.82 - 1.77 ng/dL  CBC with Differential/Platelet     Status: None   Collection Time: 07/13/24 11:32 AM  Result Value Ref Range   WBC 6.0 3.4 - 10.8 x10E3/uL   RBC 4.92 3.77 - 5.28 x10E6/uL   Hemoglobin 14.9 11.1 - 15.9 g/dL   Hematocrit 54.8 65.9 - 46.6 %   MCV 92 79 - 97 fL   MCH 30.3 26.6 - 33.0 pg   MCHC 33.0 31.5 - 35.7 g/dL   RDW 87.4 88.2 - 84.5 %   Platelets 286 150 - 450 x10E3/uL   Neutrophils 54 Not Estab. %   Lymphs 38 Not Estab. %   Monocytes 7 Not Estab. %   Eos 1 Not Estab. %   Basos 0 Not Estab. %   Neutrophils Absolute 3.2 1.4 - 7.0 x10E3/uL   Lymphocytes Absolute 2.3 0.7 - 3.1 x10E3/uL   Monocytes Absolute 0.4 0.1 - 0.9 x10E3/uL   EOS (ABSOLUTE) 0.1 0.0 - 0.4 x10E3/uL   Basophils Absolute 0.0 0.0 - 0.2 x10E3/uL   Immature Granulocytes 0 Not Estab. %   Immature Grans (Abs) 0.0 0.0 - 0.1 x10E3/uL  Comprehensive metabolic panel with GFR     Status: Abnormal   Collection Time: 07/13/24 11:32 AM  Result Value Ref Range   Glucose 92 70 - 99 mg/dL   BUN 11 6 - 24 mg/dL   Creatinine, Ser 8.97 (  H) 0.57 - 1.00 mg/dL   eGFR 67 >40 fO/fpw/8.26   BUN/Creatinine Ratio 11 9 - 23   Sodium 141 134 - 144 mmol/L   Potassium 4.5 3.5 - 5.2 mmol/L   Chloride 102 96 - 106 mmol/L   CO2 25 20 - 29 mmol/L   Calcium  10.0 8.7 - 10.2 mg/dL   Total Protein 7.1 6.0 - 8.5 g/dL   Albumin 4.7 3.9 - 4.9 g/dL   Globulin, Total 2.4 1.5 - 4.5 g/dL   Bilirubin Total 0.3 0.0 - 1.2 mg/dL   Alkaline Phosphatase 94 41 - 116 IU/L   AST 21 0 - 40 IU/L   ALT 20 0 - 32 IU/L  Lipid Panel With LDL/HDL Ratio     Status: Abnormal   Collection Time: 07/13/24 11:32 AM  Result Value Ref Range   Cholesterol, Total 159 100 - 199 mg/dL   Triglycerides 845 (H) 0 - 149 mg/dL   HDL 48 >60 mg/dL   VLDL Cholesterol Cal 27 5 - 40  mg/dL   LDL Chol Calc (NIH) 84 0 - 99 mg/dL   LDL/HDL Ratio 1.8 0.0 - 3.2 ratio    Comment:                                     LDL/HDL Ratio                                             Men  Women                               1/2 Avg.Risk  1.0    1.5                                   Avg.Risk  3.6    3.2                                2X Avg.Risk  6.2    5.0                                3X Avg.Risk  8.0    6.1   B12 and Folate Panel     Status: Abnormal   Collection Time: 07/13/24 11:32 AM  Result Value Ref Range   Vitamin B-12 >2000 (H) 232 - 1245 pg/mL   Folate >20.0 >3.0 ng/mL    Comment: A serum folate concentration of less than 3.1 ng/mL is considered to represent clinical deficiency.   VITAMIN D  25 Hydroxy (Vit-D Deficiency, Fractures)     Status: None   Collection Time: 07/13/24 11:32 AM  Result Value Ref Range   Vit D, 25-Hydroxy 72.3 30.0 - 100.0 ng/mL    Comment: Vitamin D  deficiency has been defined by the Institute of Medicine and an Endocrine Society practice guideline as a level of serum 25-OH vitamin D  less than 20 ng/mL (1,2). The Endocrine Society went on to further define vitamin D  insufficiency as a level between 21 and 29 ng/mL (2). 1. IOM (Institute of Medicine). 2010.  Dietary reference    intakes for calcium  and D. [place]  [place]: The    [employer]. 2. Holick MF, Binkley [place], Bischoff-Ferrari HA, et al.    Evaluation, treatment, and prevention of vitamin D     deficiency: an Endocrine Society clinical practice    guideline. JCEM. 2011 Jul; 96(7):1911-30.        Assessment/Plan: 1. Encounter for general adult medical examination with abnormal findings (Primary) CPE performed, due for colonoscopy, pap, and Mammogram and will schedule  2. Type 2 diabetes mellitus with hyperglycemia, without long-term current use of insulin (HCC) May increase to mounjaro  7.5 mg weekly and will check A1c next visit, has been well controlled - tirzepatide   (MOUNJARO ) 7.5 MG/0.5ML Pen; Inject 7.5 mg into the skin once a week.  Dispense: 2 mL; Refill: 2  3. Mixed hyperlipidemia Will increase to 10mg  daily crestor  - rosuvastatin  (CRESTOR ) 10 MG tablet; Take 1 tablet (10 mg total) by mouth daily.  Dispense: 90 tablet; Refill: 3  4. Acquired hypothyroidism Continue current medication  5. Diabetic eye exam [facility]) Needs referral for eye exam - Ambulatory referral to Ophthalmology  6. Gastroesophageal reflux disease without esophagitis - omeprazole  (PRILOSEC) 40 MG capsule; Take 1 capsule (40 mg total) by mouth daily as needed (acid reflux).  Dispense: 30 capsule; Refill: 2  7. Screening for colorectal cancer - Ambulatory referral to Gastroenterology   General Counseling: cecila satcher understanding of the findings of todays visit and agrees with plan of treatment. I have discussed any further diagnostic evaluation that may be needed or ordered today. We also reviewed her medications today. she has been encouraged to call the office with any questions or concerns that should arise related to todays visit.    Counseling:    Orders Placed This Encounter  Procedures   Ambulatory referral to Ophthalmology   Ambulatory referral to Gastroenterology    Meds ordered this encounter  Medications   tirzepatide  (MOUNJARO ) 7.5 MG/0.5ML Pen    Sig: Inject 7.5 mg into the skin once a week.    Dispense:  2 mL    Refill:  2   omeprazole  (PRILOSEC) 40 MG capsule    Sig: Take 1 capsule (40 mg total) by mouth daily as needed (acid reflux).    Dispense:  30 capsule    Refill:  2   rosuvastatin  (CRESTOR ) 10 MG tablet    Sig: Take 1 tablet (10 mg total) by mouth daily.    Dispense:  90 tablet    Refill:  3    This patient was seen by Tinnie Pro, PA-C in collaboration with Dr. Sigrid Bathe as a part of collaborative care agreement.  Total time spent:35 Minutes  Time spent includes review of chart, medications, test results, and follow up  plan with the patient.     Sigrid CHRISTELLA Bathe, MD  Internal Medicine

## 2024-07-26 LAB — UA/M W/RFLX CULTURE, ROUTINE
Bilirubin, UA: NEGATIVE
Glucose, UA: NEGATIVE
Ketones, UA: NEGATIVE
Leukocytes,UA: NEGATIVE
Nitrite, UA: NEGATIVE
Protein,UA: NEGATIVE
RBC, UA: NEGATIVE
Specific Gravity, UA: 1.022 (ref 1.005–1.030)
Urobilinogen, Ur: 1 mg/dL (ref 0.2–1.0)
pH, UA: 5 (ref 5.0–7.5)

## 2024-07-26 LAB — URINE CULTURE, REFLEX

## 2024-07-26 LAB — MICROSCOPIC EXAMINATION: Casts: NONE SEEN /LPF

## 2024-07-27 ENCOUNTER — Telehealth: Payer: Self-pay | Admitting: Physician Assistant

## 2024-07-27 NOTE — Telephone Encounter (Signed)
 Gastroenterology referral sent via Epic to [facility]. Gave pt telephone # [phone]  Ophthalmology referral sent via Epic to [facility].  Gave pt telephone # [phone]  Let patient vm

## 2024-08-19 ENCOUNTER — Telehealth: Payer: Self-pay | Admitting: Physician Assistant

## 2024-08-19 NOTE — Telephone Encounter (Signed)
 Per Amy w/ [facility], referral has been closed due to patient not returning calls -Andree

## 2024-08-21 ENCOUNTER — Other Ambulatory Visit: Payer: Self-pay | Admitting: Physician Assistant

## 2024-08-21 ENCOUNTER — Telehealth: Payer: Self-pay

## 2024-08-21 ENCOUNTER — Telehealth: Payer: Self-pay | Admitting: Physician Assistant

## 2024-08-21 DIAGNOSIS — E1165 Type 2 diabetes mellitus with hyperglycemia: Secondary | ICD-10-CM

## 2024-08-21 MED ORDER — TIRZEPATIDE 10 MG/0.5ML ~~LOC~~ SOAJ: 10.0000 mg | SUBCUTANEOUS | 2 refills | Status: AC

## 2024-08-21 NOTE — Telephone Encounter (Signed)
 Pt notified that we increased dose

## 2024-08-21 NOTE — Telephone Encounter (Signed)
 Gastroenterology appointment 10/16/2024 with [facility] -Andree

## 2024-08-21 NOTE — Telephone Encounter (Signed)
 Increased dose sent and can stay on this until next visit when we check A1c

## 2024-08-24 NOTE — Telephone Encounter (Signed)
 Pt.notified

## 2024-09-30 ENCOUNTER — Telehealth: Payer: Self-pay

## 2024-09-30 NOTE — Telephone Encounter (Signed)
 Completed appeal for patient's Mounjaro .

## 2024-10-01 ENCOUNTER — Ambulatory Visit: Admitting: Physician Assistant

## 2024-10-01 ENCOUNTER — Encounter: Payer: Self-pay | Admitting: Physician Assistant

## 2024-10-01 VITALS — BP 103/70 | HR 90 | Temp 98.0°F | Resp 16 | Ht 67.0 in | Wt 258.0 lb

## 2024-10-01 DIAGNOSIS — J029 Acute pharyngitis, unspecified: Secondary | ICD-10-CM

## 2024-10-01 DIAGNOSIS — J0100 Acute maxillary sinusitis, unspecified: Secondary | ICD-10-CM | POA: Diagnosis not present

## 2024-10-01 DIAGNOSIS — R52 Pain, unspecified: Secondary | ICD-10-CM | POA: Diagnosis not present

## 2024-10-01 LAB — POCT RAPID STREP A (OFFICE): Rapid Strep A Screen: NEGATIVE

## 2024-10-01 LAB — POCT INFLUENZA A/B
Influenza A, POC: NEGATIVE
Influenza B, POC: NEGATIVE

## 2024-10-01 MED ORDER — AMOXICILLIN-POT CLAVULANATE 875-125 MG PO TABS: 1.0000 | ORAL_TABLET | Freq: Two times a day (BID) | ORAL | 0 refills | Status: AC

## 2024-10-01 NOTE — Progress Notes (Signed)
 " [facility] [address] [place], [place] [address]  Internal MEDICINE  Office Visit Note  Patient Name: Ashley Stephens  [id number]  [id number]  Date of Service: 10/01/2024  Chief Complaint  Patient presents with   Acute Visit   Sore Throat   Nasal Congestion   Generalized Body Aches     HPI Pt is here for a sick visit. -Symptoms started yesterday, body aches, sore throat, some congestion and cough.  -No SOB or wheezing. Not aware of any fever or chills -49yo that stays with her had strep, but states this girl's mother also had flu so girl was exposed to this prior to coming to her house. -felt swollen in nose when she went to do flu swab  Current Medication:  Outpatient Encounter Medications as of 10/01/2024  Medication Sig Note   acetaminophen  (TYLENOL ) 650 MG CR tablet Take 1,300 mg by mouth every 8 (eight) hours as needed for pain.    Adapalene-Benzoyl Peroxide 0.3-2.5 % GEL Apply 1 Application topically daily as needed (acne).    ALPRAZolam  (XANAX ) 0.25 MG tablet Take 1 tablet (0.25 mg total) by mouth 2 (two) times daily as needed. for anxiety    amoxicillin -clavulanate (AUGMENTIN ) 875-125 MG tablet Take 1 tablet by mouth 2 (two) times daily. Take with food.    Ascorbic Acid (VITAMIN C) 1000 MG tablet Take 1,000 mg by mouth daily.    Cholecalciferol (VITAMIN D3 ULTRA STRENGTH) 125 MCG (5000 UT) capsule Take 5,000 Units by mouth daily.    clindamycin (CLINDAGEL) 1 % gel Apply 1 Application topically daily as needed (acne).    Cyanocobalamin  (B-12) 2500 MCG TABS Take 2,500 mcg by mouth daily.    desvenlafaxine  (PRISTIQ ) 100 MG 24 hr tablet Take 1 tablet (100 mg total) by mouth every morning.    desvenlafaxine  (PRISTIQ ) 50 MG 24 hr tablet Take 1 tablet (50 mg total) by mouth at bedtime.    ibuprofen  (ADVIL ) 200 MG tablet Take 400 mg by mouth every 6 (six) hours as needed for moderate pain (pain score 4-6).    levothyroxine  (SYNTHROID ) 50 MCG tablet Take  1 tablet (50 mcg total) by mouth daily before breakfast.    Multiple Vitamin (MULTIVITAMIN WITH MINERALS) TABS tablet Take 1 tablet by mouth daily.    omeprazole  (PRILOSEC) 40 MG capsule Take 1 capsule (40 mg total) by mouth daily as needed (acid reflux).    Oxcarbazepine  (TRILEPTAL ) 300 MG tablet Take 1 tablet (300 mg total) by mouth 2 (two) times daily.    propranolol  (INDERAL ) 20 MG tablet Take 1 tablet (20 mg total) by mouth 3 (three) times daily as needed. (Patient taking differently: Take 20 mg by mouth 3 (three) times daily.)    rosuvastatin  (CRESTOR ) 10 MG tablet Take 1 tablet (10 mg total) by mouth daily.    spironolactone (ALDACTONE) 100 MG tablet Take 100 mg by mouth 2 (two) times daily. 10/03/2023: Pt ran out, needs to see the dr before continuing    tirzepatide  (MOUNJARO ) 10 MG/0.5ML Pen Inject 10 mg into the skin once a week.    Zinc 50 MG TABS Take 50 mg by mouth daily.    No facility-administered encounter medications on file as of 10/01/2024.      Medical History: Past Medical History:  Diagnosis Date   Anxiety    B12 deficiency    DDD (degenerative disc disease), lumbar    Depression    DM (diabetes mellitus), type 2 (HCC)    Gastric ulcer  GERD (gastroesophageal reflux disease)    History of Roux-en-Y gastric bypass    Hypothyroidism    Obesity    Osteoarthritis    Sleep apnea    h/o OSA but never used cpap   Symptomatic cholelithiasis    Thyroid  disorder      Vital Signs: BP 103/70   Pulse 90   Temp 98 F (36.7 C)   Resp 16   Ht 5' 7 (1.702 m)   Wt 258 lb (117 kg)   SpO2 99%   BMI 40.41 kg/m    Review of Systems  Constitutional:  Negative for chills and fever.  HENT:  Positive for congestion, postnasal drip, sinus pressure and sore throat. Negative for mouth sores.   Respiratory:  Positive for cough. Negative for shortness of breath and wheezing.   Cardiovascular:  Negative for chest pain.  Genitourinary:  Negative for flank pain.   Musculoskeletal:  Positive for myalgias.  Skin:  Negative for rash.  Psychiatric/Behavioral: Negative.      Physical Exam Vitals and nursing note reviewed.  Constitutional:      General: She is not in acute distress.    Appearance: Normal appearance. She is obese. She is ill-appearing.  HENT:     Head: Normocephalic and atraumatic.     Nose: Congestion present.     Mouth/Throat:     Pharynx: Posterior oropharyngeal erythema present.  Eyes:     Pupils: Pupils are equal, round, and reactive to light.  Cardiovascular:     Rate and Rhythm: Normal rate and regular rhythm.  Pulmonary:     Effort: Pulmonary effort is normal. No respiratory distress.     Breath sounds: No wheezing.  Skin:    General: Skin is warm and dry.  Neurological:     Mental Status: She is alert and oriented to person, place, and time.  Psychiatric:        Mood and Affect: Mood normal.        Behavior: Behavior normal.       Assessment/Plan: 1. Acute non-recurrent maxillary sinusitis (Primary) Will treat with augmentin  for sinusitis. Advised to drink plenty of fluids and may use tylenol  as needed. May use mucinex for congestion - amoxicillin -clavulanate (AUGMENTIN ) 875-125 MG tablet; Take 1 tablet by mouth 2 (two) times daily. Take with food.  Dispense: 14 tablet; Refill: 0  2. Sore throat - POCT rapid strep A negative  3. Body aches - POCT Influenza A/B negative   General Counseling: Ashley Stephens verbalizes understanding of the findings of todays visit and agrees with plan of treatment. I have discussed any further diagnostic evaluation that may be needed or ordered today. We also reviewed her medications today. she has been encouraged to call the office with any questions or concerns that should arise related to todays visit.    Counseling:    Orders Placed This Encounter  Procedures   POCT rapid strep A   POCT Influenza A/B    Meds ordered this encounter  Medications    amoxicillin -clavulanate (AUGMENTIN ) 875-125 MG tablet    Sig: Take 1 tablet by mouth 2 (two) times daily. Take with food.    Dispense:  14 tablet    Refill:  0    Time spent:30 Minutes "

## 2024-10-26 ENCOUNTER — Ambulatory Visit: Admitting: Physician Assistant

## 2025-07-26 ENCOUNTER — Encounter: Admitting: Physician Assistant
